# Patient Record
Sex: Male | Born: 1960 | Race: White | Hispanic: No | Smoking: Former smoker
Health system: Southern US, Community
[De-identification: ages and names within clinical notes are randomized; demographics above are authoritative.]

## PROBLEM LIST (undated history)

## (undated) DIAGNOSIS — C7951 Secondary malignant neoplasm of bone: Secondary | ICD-10-CM

## (undated) DIAGNOSIS — K219 Gastro-esophageal reflux disease without esophagitis: Secondary | ICD-10-CM

## (undated) DIAGNOSIS — C029 Malignant neoplasm of tongue, unspecified: Secondary | ICD-10-CM

## (undated) DIAGNOSIS — I10 Essential (primary) hypertension: Secondary | ICD-10-CM

## (undated) DIAGNOSIS — F329 Major depressive disorder, single episode, unspecified: Secondary | ICD-10-CM

## (undated) DIAGNOSIS — C7800 Secondary malignant neoplasm of unspecified lung: Secondary | ICD-10-CM

## (undated) DIAGNOSIS — R21 Rash and other nonspecific skin eruption: Secondary | ICD-10-CM

## (undated) DIAGNOSIS — K449 Diaphragmatic hernia without obstruction or gangrene: Secondary | ICD-10-CM

## (undated) DIAGNOSIS — Z923 Personal history of irradiation: Secondary | ICD-10-CM

## (undated) DIAGNOSIS — F32A Depression, unspecified: Secondary | ICD-10-CM

## (undated) DIAGNOSIS — C801 Malignant (primary) neoplasm, unspecified: Secondary | ICD-10-CM

## (undated) DIAGNOSIS — Z9221 Personal history of antineoplastic chemotherapy: Secondary | ICD-10-CM

## (undated) DIAGNOSIS — R911 Solitary pulmonary nodule: Secondary | ICD-10-CM

## (undated) HISTORY — DX: Solitary pulmonary nodule: R91.1

## (undated) HISTORY — DX: Malignant neoplasm of tongue, unspecified: C02.9

## (undated) HISTORY — PX: TONSILLECTOMY AND ADENOIDECTOMY: SUR1326

## (undated) HISTORY — DX: Depression, unspecified: F32.A

## (undated) HISTORY — PX: HERNIA REPAIR: SHX51

## (undated) HISTORY — DX: Rash and other nonspecific skin eruption: R21

## (undated) HISTORY — DX: Personal history of irradiation: Z92.3

## (undated) HISTORY — PX: OTHER SURGICAL HISTORY: SHX169

## (undated) HISTORY — DX: Secondary malignant neoplasm of bone: C79.51

## (undated) HISTORY — DX: Essential (primary) hypertension: I10

## (undated) HISTORY — DX: Major depressive disorder, single episode, unspecified: F32.9

---

## 2003-01-22 ENCOUNTER — Encounter: Payer: Self-pay | Admitting: Internal Medicine

## 2004-11-17 ENCOUNTER — Ambulatory Visit: Payer: Self-pay | Admitting: Internal Medicine

## 2005-11-23 ENCOUNTER — Ambulatory Visit: Payer: Self-pay | Admitting: Internal Medicine

## 2005-11-30 ENCOUNTER — Ambulatory Visit: Payer: Self-pay | Admitting: Internal Medicine

## 2007-01-24 ENCOUNTER — Ambulatory Visit: Payer: Self-pay | Admitting: Internal Medicine

## 2007-03-07 ENCOUNTER — Ambulatory Visit: Payer: Self-pay | Admitting: Internal Medicine

## 2007-03-12 DIAGNOSIS — I10 Essential (primary) hypertension: Secondary | ICD-10-CM | POA: Insufficient documentation

## 2008-02-14 ENCOUNTER — Ambulatory Visit: Payer: Self-pay | Admitting: Internal Medicine

## 2008-02-14 LAB — CONVERTED CEMR LAB
ALT: 19 units/L (ref 0–53)
BUN: 15 mg/dL (ref 6–23)
Basophils Relative: 0.7 % (ref 0.0–1.0)
Bilirubin, Direct: 0.1 mg/dL (ref 0.0–0.3)
CO2: 26 meq/L (ref 19–32)
Calcium: 9.7 mg/dL (ref 8.4–10.5)
Creatinine, Ser: 1.1 mg/dL (ref 0.4–1.5)
Direct LDL: 64.8 mg/dL
Glucose, Bld: 95 mg/dL (ref 70–99)
Glucose, Urine, Semiquant: NEGATIVE
Hemoglobin: 13.2 g/dL (ref 13.0–17.0)
Lymphocytes Relative: 28 % (ref 12.0–46.0)
Monocytes Relative: 8.8 % (ref 3.0–12.0)
Neutro Abs: 4.3 10*3/uL (ref 1.4–7.7)
Protein, U semiquant: NEGATIVE
RBC: 3.89 M/uL — ABNORMAL LOW (ref 4.22–5.81)
TSH: 2.38 microintl units/mL (ref 0.35–5.50)
Total CHOL/HDL Ratio: 6
Total Protein: 7.3 g/dL (ref 6.0–8.3)
Urobilinogen, UA: 0.2
VLDL: 88 mg/dL — ABNORMAL HIGH (ref 0–40)
WBC Urine, dipstick: NEGATIVE
pH: 5

## 2008-02-21 ENCOUNTER — Ambulatory Visit: Payer: Self-pay | Admitting: Internal Medicine

## 2008-02-21 DIAGNOSIS — F3289 Other specified depressive episodes: Secondary | ICD-10-CM | POA: Insufficient documentation

## 2008-02-21 DIAGNOSIS — K5730 Diverticulosis of large intestine without perforation or abscess without bleeding: Secondary | ICD-10-CM | POA: Insufficient documentation

## 2008-02-21 DIAGNOSIS — K649 Unspecified hemorrhoids: Secondary | ICD-10-CM | POA: Insufficient documentation

## 2008-02-21 DIAGNOSIS — K589 Irritable bowel syndrome without diarrhea: Secondary | ICD-10-CM

## 2008-02-21 DIAGNOSIS — F329 Major depressive disorder, single episode, unspecified: Secondary | ICD-10-CM

## 2008-02-21 DIAGNOSIS — K4090 Unilateral inguinal hernia, without obstruction or gangrene, not specified as recurrent: Secondary | ICD-10-CM | POA: Insufficient documentation

## 2008-02-28 ENCOUNTER — Telehealth: Payer: Self-pay | Admitting: Internal Medicine

## 2008-02-29 ENCOUNTER — Telehealth: Payer: Self-pay | Admitting: Internal Medicine

## 2008-03-13 ENCOUNTER — Emergency Department (HOSPITAL_COMMUNITY): Admission: EM | Admit: 2008-03-13 | Discharge: 2008-03-13 | Payer: Self-pay | Admitting: Emergency Medicine

## 2008-03-20 ENCOUNTER — Ambulatory Visit: Payer: Self-pay | Admitting: Internal Medicine

## 2008-03-20 DIAGNOSIS — M549 Dorsalgia, unspecified: Secondary | ICD-10-CM | POA: Insufficient documentation

## 2008-03-20 LAB — HM COLONOSCOPY

## 2008-03-21 ENCOUNTER — Ambulatory Visit: Payer: Self-pay | Admitting: Internal Medicine

## 2008-03-24 ENCOUNTER — Telehealth: Payer: Self-pay | Admitting: Internal Medicine

## 2008-03-25 ENCOUNTER — Encounter: Payer: Self-pay | Admitting: Internal Medicine

## 2008-04-01 ENCOUNTER — Ambulatory Visit (HOSPITAL_COMMUNITY): Admission: RE | Admit: 2008-04-01 | Discharge: 2008-04-01 | Payer: Self-pay | Admitting: Chiropractic Medicine

## 2008-04-17 ENCOUNTER — Encounter: Admission: RE | Admit: 2008-04-17 | Discharge: 2008-04-17 | Payer: Self-pay | Admitting: Surgery

## 2008-04-18 ENCOUNTER — Ambulatory Visit: Payer: Self-pay | Admitting: Internal Medicine

## 2008-04-22 ENCOUNTER — Encounter: Payer: Self-pay | Admitting: Internal Medicine

## 2008-06-04 ENCOUNTER — Telehealth: Payer: Self-pay | Admitting: Internal Medicine

## 2008-06-10 ENCOUNTER — Telehealth: Payer: Self-pay | Admitting: Internal Medicine

## 2010-12-03 ENCOUNTER — Ambulatory Visit (INDEPENDENT_AMBULATORY_CARE_PROVIDER_SITE_OTHER): Payer: Self-pay | Admitting: Internal Medicine

## 2010-12-03 ENCOUNTER — Encounter: Payer: Self-pay | Admitting: Internal Medicine

## 2010-12-03 DIAGNOSIS — I10 Essential (primary) hypertension: Secondary | ICD-10-CM

## 2010-12-03 DIAGNOSIS — K146 Glossodynia: Secondary | ICD-10-CM

## 2010-12-03 DIAGNOSIS — K148 Other diseases of tongue: Secondary | ICD-10-CM

## 2010-12-05 NOTE — Assessment & Plan Note (Signed)
Given persistence of lesion over 2-6 months recommend ENT evaluation to r/o malignancy. Consult appt will be made.

## 2010-12-05 NOTE — Progress Notes (Signed)
  Subjective:    Patient ID: Thomas Bean, male    DOB: 1961/05/15, 50 y.o.   MRN: [id number]  HPI Pt presents to clinic for evalution of tongue sore. Notes raised sore at left lateral tongue border for at least 2 months and may be up to 6 months. Irritation and pain waxes and wanes but the area has never healed and resolved. There has been no injury/trauma and denies drainage. No alleviating factors. Taking no medication for the problem. BP elevated with h/o htn but stopped ace inhib last year. Recently taking claritin D.      Review of Systems see hpi     Objective:   Physical Exam  Constitutional: He appears well-developed and well-nourished. No distress.  HENT:  Head: Normocephalic.  Mouth/Throat:         Left lateral tongue sl raised irritated white/red papule. No drainage. Mild surrounding erythema  Eyes: Conjunctivae are normal. No scleral icterus.          Assessment & Plan:

## 2010-12-05 NOTE — Assessment & Plan Note (Signed)
Suboptimal control. Asx. Stop decongestants and begin outpt bp log to be submitted for review

## 2010-12-12 DIAGNOSIS — C029 Malignant neoplasm of tongue, unspecified: Secondary | ICD-10-CM

## 2010-12-12 HISTORY — DX: Malignant neoplasm of tongue, unspecified: C02.9

## 2010-12-15 ENCOUNTER — Other Ambulatory Visit (HOSPITAL_COMMUNITY): Payer: Self-pay | Admitting: Otolaryngology

## 2010-12-15 ENCOUNTER — Telehealth: Payer: Self-pay | Admitting: *Deleted

## 2010-12-15 DIAGNOSIS — I10 Essential (primary) hypertension: Secondary | ICD-10-CM

## 2010-12-15 DIAGNOSIS — IMO0002 Reserved for concepts with insufficient information to code with codable children: Secondary | ICD-10-CM

## 2010-12-15 NOTE — Telephone Encounter (Signed)
Pt is to have surgery on his tongue.  Biopsy was carcinoma?  He needs Lisinopril, and states she did not get it the day he saw Dr. Rodena Medin.

## 2010-12-16 ENCOUNTER — Ambulatory Visit: Payer: Self-pay | Admitting: Family Medicine

## 2010-12-16 MED ORDER — LISINOPRIL-HYDROCHLOROTHIAZIDE 20-25 MG PO TABS: 1.0000 | ORAL_TABLET | Freq: Every day | ORAL | Status: DC

## 2010-12-16 NOTE — Telephone Encounter (Signed)
rx sent in electronically 

## 2010-12-17 ENCOUNTER — Encounter (HOSPITAL_COMMUNITY)
Admission: RE | Admit: 2010-12-17 | Discharge: 2010-12-17 | Disposition: A | Discharge status: Home / Self Care | Payer: Self-pay | Source: Ambulatory Visit | Attending: Otolaryngology | Admitting: Otolaryngology

## 2010-12-17 ENCOUNTER — Encounter (HOSPITAL_COMMUNITY): Payer: Self-pay

## 2010-12-17 DIAGNOSIS — D739 Disease of spleen, unspecified: Secondary | ICD-10-CM | POA: Insufficient documentation

## 2010-12-17 DIAGNOSIS — I700 Atherosclerosis of aorta: Secondary | ICD-10-CM | POA: Insufficient documentation

## 2010-12-17 DIAGNOSIS — K8020 Calculus of gallbladder without cholecystitis without obstruction: Secondary | ICD-10-CM | POA: Insufficient documentation

## 2010-12-17 DIAGNOSIS — J984 Other disorders of lung: Secondary | ICD-10-CM | POA: Insufficient documentation

## 2010-12-17 DIAGNOSIS — C01 Malignant neoplasm of base of tongue: Secondary | ICD-10-CM | POA: Insufficient documentation

## 2010-12-17 DIAGNOSIS — IMO0002 Reserved for concepts with insufficient information to code with codable children: Secondary | ICD-10-CM

## 2010-12-17 DIAGNOSIS — N281 Cyst of kidney, acquired: Secondary | ICD-10-CM | POA: Insufficient documentation

## 2010-12-17 HISTORY — DX: Malignant (primary) neoplasm, unspecified: C80.1

## 2010-12-17 MED ORDER — FLUDEOXYGLUCOSE F - 18 (FDG) INJECTION
19.2000 | Freq: Once | INTRAVENOUS | Status: AC | PRN
  Administered 2010-12-17: 19.2 via INTRAVENOUS

## 2010-12-21 ENCOUNTER — Ambulatory Visit (INDEPENDENT_AMBULATORY_CARE_PROVIDER_SITE_OTHER): Payer: Self-pay | Admitting: Internal Medicine

## 2010-12-21 ENCOUNTER — Encounter: Payer: Self-pay | Admitting: Internal Medicine

## 2010-12-21 ENCOUNTER — Telehealth: Payer: Self-pay

## 2010-12-21 ENCOUNTER — Encounter (HOSPITAL_BASED_OUTPATIENT_CLINIC_OR_DEPARTMENT_OTHER)
Admission: RE | Admit: 2010-12-21 | Discharge: 2010-12-21 | Disposition: A | Discharge status: Home / Self Care | Payer: Self-pay | Source: Ambulatory Visit | Attending: Otolaryngology | Admitting: Otolaryngology

## 2010-12-21 VITALS — BP 140/90 | HR 100 | Temp 98.6°F | Wt 196.0 lb

## 2010-12-21 DIAGNOSIS — F411 Generalized anxiety disorder: Secondary | ICD-10-CM

## 2010-12-21 DIAGNOSIS — Z79899 Other long term (current) drug therapy: Secondary | ICD-10-CM

## 2010-12-21 DIAGNOSIS — C029 Malignant neoplasm of tongue, unspecified: Secondary | ICD-10-CM | POA: Insufficient documentation

## 2010-12-21 DIAGNOSIS — F419 Anxiety disorder, unspecified: Secondary | ICD-10-CM | POA: Insufficient documentation

## 2010-12-21 DIAGNOSIS — I10 Essential (primary) hypertension: Secondary | ICD-10-CM

## 2010-12-21 LAB — BASIC METABOLIC PANEL
BUN: 12 mg/dL (ref 6–23)
GFR: 85.66 mL/min (ref >60.00)
Glucose, Bld: 92 mg/dL (ref 70–99)
Potassium: 4.2 mEq/L (ref 3.5–5.1)

## 2010-12-21 MED ORDER — ALPRAZOLAM 0.25 MG PO TABS: 0.2500 mg | ORAL_TABLET | Freq: Three times a day (TID) | ORAL | Status: DC | PRN

## 2010-12-21 MED ORDER — LISINOPRIL-HYDROCHLOROTHIAZIDE 20-25 MG PO TABS: 1.0000 | ORAL_TABLET | Freq: Every day | ORAL | Status: DC

## 2010-12-21 NOTE — Assessment & Plan Note (Signed)
Begin xanax prn short term. Discussed potential for sedation, addiction and/or tolerance.

## 2010-12-21 NOTE — Progress Notes (Signed)
  Subjective:    Patient ID: Thomas Bean, male    DOB: 05-30-1961, 50 y.o.   MRN: [id number]  HPI Pt presents to clinic for f/u of HTN and tongue lesion. Was referred last visit to ENT for persistent tongue lesion. Bx + for SSC. PET reportedly demonstrated 2nd lesion at base of tongue. Scheduled for surgery in 2 days. H/o HTN off medication. Home BP's have been mildly high. Previously took ace/hct and had mild tolerable cough.  Still taking decongestant/antihistamine for allergies.  Is anxious regarding recent dx and upcoming surgery.  Reviewed pmh, medications and allergies.    Review of Systems Denies tongue pain or difficulty swallowing.     Objective:   Physical Exam  Vitals reviewed. Constitutional: He appears well-developed and well-nourished. No distress.  HENT:  Head: Normocephalic and atraumatic.  Right Ear: External ear normal.  Left Ear: External ear normal.  Eyes: Conjunctivae are normal. No scleral icterus.  Cardiovascular: Normal rate, regular rhythm and normal heart sounds.   Pulmonary/Chest: Effort normal and breath sounds normal. No respiratory distress. He has no wheezes.  Skin: He is not diaphoretic.          Assessment & Plan:

## 2010-12-21 NOTE — Telephone Encounter (Signed)
Message copied by Kyung Rudd on Tue Dec 21, 2010  4:29 PM ------      Message from: Letitia Libra, Maisie Fus      Created: Tue Dec 21, 2010  3:49 PM       chem7 nl. pls give him a copy. He might have preop labs and this could save him money

## 2010-12-21 NOTE — Assessment & Plan Note (Signed)
Avoid decongestants. Resume ace/hctz. Monitor bp at home. Check chem7 today. F/u in one month or sooner if necessary.

## 2010-12-23 ENCOUNTER — Ambulatory Visit (HOSPITAL_BASED_OUTPATIENT_CLINIC_OR_DEPARTMENT_OTHER)
Admission: RE | Admit: 2010-12-23 | Discharge: 2010-12-23 | Disposition: A | Discharge status: Home / Self Care | Payer: Self-pay | Source: Ambulatory Visit | Attending: Otolaryngology | Admitting: Otolaryngology

## 2010-12-23 ENCOUNTER — Other Ambulatory Visit: Payer: Self-pay | Admitting: Otolaryngology

## 2010-12-23 DIAGNOSIS — Z01812 Encounter for preprocedural laboratory examination: Secondary | ICD-10-CM | POA: Insufficient documentation

## 2010-12-23 DIAGNOSIS — F17200 Nicotine dependence, unspecified, uncomplicated: Secondary | ICD-10-CM | POA: Insufficient documentation

## 2010-12-23 DIAGNOSIS — C023 Malignant neoplasm of anterior two-thirds of tongue, part unspecified: Secondary | ICD-10-CM | POA: Insufficient documentation

## 2011-01-05 NOTE — Op Note (Addendum)
[name], [name]          ACCOUNT NO.:  [id number]  MEDICAL RECORD NO.:  [id number]           PATIENT TYPE:  O  LOCATION:  XRAY                         FACILITY:  [facility]  PHYSICIAN:  Kristine Garbe. Ezzard Standing, M.D.DATE OF BIRTH:  [birth year]  DATE OF PROCEDURE:  12/23/2010 DATE OF DISCHARGE:  12/17/2010                              OPERATIVE REPORT   PREOPERATIVE DIAGNOSIS:  Left lateral tongue squamous cell carcinoma.  POSTOPERATIVE DIAGNOSIS:  Left lateral tongue squamous cell carcinoma.  OPERATION PERFORMED:  Direct laryngoscopy with biopsy.  Left subtotal hemiglossectomy.  SURGEON:  Neilan Rizzo. Ezzard Standing, M.D.  ANESTHESIA:  General endotracheal.  ESTIMATED BLOOD LOSS:  30 mL.  COMPLICATIONS:  None.  BRIEF CLINICAL NOTE:  Thomas Bean is a 50 year old gentleman who has had a sore on the left lateral tongue now for several months.  This gradually had been getting worse.  He has a smoking history of approximately a pack a day, but has reduced his smoking recently.  Only a social alcohol user.  A biopsy in the office of the left lateral tongue lesion revealed invasive squamous cell carcinoma.  He subsequently underwent a PET scan for further evaluation and on the PET scan, there was no obvious metastatic disease or adenopathy in the neck. However, there was a questionable hypermetabolic region in the right base of tongue.  He was taken to the operating room at this time for direct laryngoscopy and biopsy of the right base of tongue and partial left lateral glossectomy.  The lesion measures approximately 1.5 cm in size, it is indurated and infiltrative.  DESCRIPTION OF PROCEDURE:  After adequate endotracheal anesthesia, first direct laryngoscopy was performed.  On direct laryngoscopy, the base of the tongue was actually fairly clear.  There was no ulcer on palpation of the base of tongue.  There was no definitive palpable mass.  He did have some lingual tonsil tissue  that extended down toward the base of the tongue region.  He had previous tonsillectomy as a child.  Several biopsies were obtained from the right base of tongue region where this prominent lingual tonsil tissue appeared.  There was no definite ulceration, no bleeding, and the vallecular area was otherwise clear. Direct laryngoscopy of the piriform sinus, epiglottis, AE folds, vocal cords was otherwise clear.  Following the DL and biopsy, a Cordie Grice was used to expose the oropharynx and the tongue lesion was identified.  This resided on the left lateral tongue mid to anterior. Approximate area of the molars abutted against the tongue on the left side.  The excision site was planned with cautery and marked out about a centimeter margin away from the gross visualization of the tumor. Excision was performed with a cautery, making sure we had plenty of margin away from the gross tumor.  Hemostasis was obtained with a cautery.  After adequate excision, a rim of the dorsal portion of the tongue was excised and sent as a separate specimen as was a small deep margin.  Defect was closed with 3-0 Vicryl sutures within the tongue musculature, reapproximated the edges, and then interrupted 3-0 chromic and 3-0 Vicryl sutures to reapproximate the edges of  the tongue excision.  There was minimal bleeding.  [name] tolerated this well, was awoken from anesthesia, and transferred to the recovery room postop, doing well.  DISPOSITION:  Thomas Bean is discharged home later this morning on amoxicillin suspension 500 mg b.i.d. for 10 days, Tylenol Lortab Elixir 1-1.5 tablespoons q.4 h. p.r.n. pain.  We will have him follow up in my office in 10-12 days for recheck and review pathology.          ______________________________ Kristine Garbe Ezzard Standing, M.D.     CEN/MEDQ  D:  12/23/2010  T:  12/23/2010  Job:  [id number]  cc:   Charlynn Court, MD  Electronically Signed by Dillard Cannon M.D. on  01/05/2011 01:13:35 PM

## 2011-01-18 ENCOUNTER — Telehealth: Payer: Self-pay | Admitting: Internal Medicine

## 2011-01-18 NOTE — Telephone Encounter (Signed)
Pt is req to change pcps from Dr Cato Mulligan to Dr Rodena Medin, due to availibility issues. Pls advise if this is ok?

## 2011-01-18 NOTE — Telephone Encounter (Signed)
ok 

## 2011-01-19 NOTE — Telephone Encounter (Signed)
Pt has been notified that both drs have agreed to pcp change. Pt has been sch to see Dr Rodena Medin for fup as noted.

## 2011-01-20 ENCOUNTER — Ambulatory Visit: Payer: Self-pay | Admitting: Internal Medicine

## 2011-02-11 ENCOUNTER — Ambulatory Visit: Payer: Self-pay | Admitting: Internal Medicine

## 2011-06-07 ENCOUNTER — Other Ambulatory Visit: Payer: Self-pay | Admitting: Otolaryngology

## 2011-06-07 DIAGNOSIS — C029 Malignant neoplasm of tongue, unspecified: Secondary | ICD-10-CM

## 2011-07-11 ENCOUNTER — Ambulatory Visit
Admission: RE | Admit: 2011-07-11 | Discharge: 2011-07-11 | Disposition: A | Discharge status: Home / Self Care | Payer: No Typology Code available for payment source | Source: Ambulatory Visit | Attending: Otolaryngology | Admitting: Otolaryngology

## 2011-07-11 DIAGNOSIS — C029 Malignant neoplasm of tongue, unspecified: Secondary | ICD-10-CM

## 2011-07-11 MED ORDER — IOHEXOL 300 MG/ML  SOLN
75.0000 mL | Freq: Once | INTRAMUSCULAR | Status: AC | PRN
  Administered 2011-07-11: 75 mL via INTRAVENOUS

## 2011-07-14 ENCOUNTER — Other Ambulatory Visit (HOSPITAL_COMMUNITY)
Admission: RE | Admit: 2011-07-14 | Discharge: 2011-07-14 | Disposition: A | Discharge status: Home / Self Care | Payer: Medicaid Other | Source: Ambulatory Visit | Attending: Otolaryngology | Admitting: Otolaryngology

## 2011-07-14 DIAGNOSIS — R221 Localized swelling, mass and lump, neck: Secondary | ICD-10-CM | POA: Insufficient documentation

## 2011-07-14 DIAGNOSIS — R220 Localized swelling, mass and lump, head: Secondary | ICD-10-CM | POA: Insufficient documentation

## 2011-07-21 ENCOUNTER — Other Ambulatory Visit (HOSPITAL_COMMUNITY): Payer: Self-pay | Admitting: Otolaryngology

## 2011-07-21 DIAGNOSIS — C760 Malignant neoplasm of head, face and neck: Secondary | ICD-10-CM

## 2011-07-22 ENCOUNTER — Encounter (HOSPITAL_COMMUNITY): Payer: Self-pay | Admitting: Pharmacy Technician

## 2011-07-25 ENCOUNTER — Encounter (HOSPITAL_COMMUNITY)
Admission: RE | Admit: 2011-07-25 | Discharge: 2011-07-25 | Disposition: A | Discharge status: Home / Self Care | Payer: Medicaid Other | Source: Ambulatory Visit | Attending: Otolaryngology | Admitting: Otolaryngology

## 2011-07-25 DIAGNOSIS — C029 Malignant neoplasm of tongue, unspecified: Secondary | ICD-10-CM | POA: Insufficient documentation

## 2011-07-25 DIAGNOSIS — C760 Malignant neoplasm of head, face and neck: Secondary | ICD-10-CM

## 2011-07-25 DIAGNOSIS — R599 Enlarged lymph nodes, unspecified: Secondary | ICD-10-CM | POA: Insufficient documentation

## 2011-07-25 MED ORDER — FLUDEOXYGLUCOSE F - 18 (FDG) INJECTION
18.6000 | Freq: Once | INTRAVENOUS | Status: AC | PRN
  Administered 2011-07-25: 18.6 via INTRAVENOUS

## 2011-07-26 ENCOUNTER — Encounter (HOSPITAL_COMMUNITY)
Admission: RE | Admit: 2011-07-26 | Discharge: 2011-07-26 | Disposition: A | Discharge status: Home / Self Care | Payer: Medicaid Other | Source: Ambulatory Visit | Attending: Anesthesiology | Admitting: Anesthesiology

## 2011-07-26 ENCOUNTER — Encounter (HOSPITAL_COMMUNITY): Payer: Self-pay

## 2011-07-26 ENCOUNTER — Encounter (HOSPITAL_COMMUNITY)
Admission: RE | Admit: 2011-07-26 | Discharge: 2011-07-26 | Disposition: A | Discharge status: Home / Self Care | Payer: Medicaid Other | Source: Ambulatory Visit | Attending: Otolaryngology | Admitting: Otolaryngology

## 2011-07-26 HISTORY — DX: Gastro-esophageal reflux disease without esophagitis: K21.9

## 2011-07-26 HISTORY — DX: Diaphragmatic hernia without obstruction or gangrene: K44.9

## 2011-07-26 LAB — BASIC METABOLIC PANEL
CO2: 28 mEq/L (ref 19–32)
Chloride: 106 mEq/L (ref 96–112)
Creatinine, Ser: 0.84 mg/dL (ref 0.50–1.35)
Sodium: 142 mEq/L (ref 135–145)

## 2011-07-26 LAB — CBC
HCT: 37.3 % — ABNORMAL LOW (ref 39.0–52.0)
MCV: 94.7 fL (ref 78.0–100.0)
RBC: 3.94 MIL/uL — ABNORMAL LOW (ref 4.22–5.81)
WBC: 6.9 10*3/uL (ref 4.0–10.5)

## 2011-07-26 MED ORDER — CEFAZOLIN SODIUM-DEXTROSE 2-3 GM-% IV SOLR: 2.0000 g | INTRAVENOUS | Status: DC

## 2011-07-26 NOTE — Pre-Procedure Instructions (Signed)
20 Thomas Bean  07/26/2011   Your procedure is scheduled on:  Wednesday July 27, 2011  Report to [facility] at 1230 AM.  Call this number if you have problems the morning of surgery: [phone]   Remember:   Do not eat food:After Midnight.  Do not drink clear liquids: 4 Hours before arrival. (8:30am)  Take these medicines the morning of surgery with A SIP OF WATER: claritin   Do not wear jewelry, make-up or nail polish.  Do not wear lotions, powders, or perfumes. You may wear deodorant.  Do not shave 48 hours prior to surgery.  Do not bring valuables to the hospital.  Contacts, dentures or bridgework may not be worn into surgery.  Leave suitcase in the car. After surgery it may be brought to your room.  For patients admitted to the hospital, checkout time is 11:00 AM the day of discharge.   Patients discharged the day of surgery will not be allowed to drive home.  Name and phone number of your driver: Ollie Esty [phone]  Special Instructions: CHG Shower Use Special Wash: 1/2 bottle night before surgery and 1/2 bottle morning of surgery.   Please read over the following fact sheets that you were given: Pain management, MRSA instruction, surgical site infections, coughing and deep breathing.

## 2011-07-27 ENCOUNTER — Encounter (HOSPITAL_COMMUNITY): Payer: Self-pay | Admitting: Otolaryngology

## 2011-07-27 ENCOUNTER — Other Ambulatory Visit: Payer: Self-pay | Admitting: Otolaryngology

## 2011-07-27 ENCOUNTER — Encounter (HOSPITAL_COMMUNITY): Payer: Self-pay | Admitting: *Deleted

## 2011-07-27 ENCOUNTER — Inpatient Hospital Stay (HOSPITAL_COMMUNITY)
Admission: RE | Admit: 2011-07-27 | Discharge: 2011-07-29 | DRG: 822 | Disposition: A | Discharge status: Home / Self Care | Payer: Medicaid Other | Source: Ambulatory Visit | Attending: Otolaryngology | Admitting: Otolaryngology

## 2011-07-27 ENCOUNTER — Encounter (HOSPITAL_COMMUNITY): Payer: Self-pay | Admitting: Critical Care Medicine

## 2011-07-27 ENCOUNTER — Encounter (HOSPITAL_COMMUNITY)
Admission: RE | Disposition: A | Discharge status: Home / Self Care | Payer: Self-pay | Source: Ambulatory Visit | Attending: Otolaryngology

## 2011-07-27 ENCOUNTER — Inpatient Hospital Stay (HOSPITAL_COMMUNITY): Payer: Medicaid Other | Admitting: Critical Care Medicine

## 2011-07-27 DIAGNOSIS — K449 Diaphragmatic hernia without obstruction or gangrene: Secondary | ICD-10-CM | POA: Diagnosis present

## 2011-07-27 DIAGNOSIS — F329 Major depressive disorder, single episode, unspecified: Secondary | ICD-10-CM | POA: Diagnosis present

## 2011-07-27 DIAGNOSIS — C770 Secondary and unspecified malignant neoplasm of lymph nodes of head, face and neck: Principal | ICD-10-CM | POA: Diagnosis present

## 2011-07-27 DIAGNOSIS — Z85810 Personal history of malignant neoplasm of tongue: Secondary | ICD-10-CM

## 2011-07-27 DIAGNOSIS — K219 Gastro-esophageal reflux disease without esophagitis: Secondary | ICD-10-CM | POA: Diagnosis present

## 2011-07-27 DIAGNOSIS — K649 Unspecified hemorrhoids: Secondary | ICD-10-CM | POA: Diagnosis present

## 2011-07-27 DIAGNOSIS — Z01818 Encounter for other preprocedural examination: Secondary | ICD-10-CM

## 2011-07-27 DIAGNOSIS — Z01812 Encounter for preprocedural laboratory examination: Secondary | ICD-10-CM

## 2011-07-27 DIAGNOSIS — F17200 Nicotine dependence, unspecified, uncomplicated: Secondary | ICD-10-CM | POA: Diagnosis present

## 2011-07-27 DIAGNOSIS — F3289 Other specified depressive episodes: Secondary | ICD-10-CM | POA: Diagnosis present

## 2011-07-27 HISTORY — PX: NECK MASS EXCISION: SHX2079

## 2011-07-27 HISTORY — PX: RADICAL NECK DISSECTION: SHX2284

## 2011-07-27 SURGERY — DISSECTION, NECK, RADICAL
Anesthesia: General | Site: Neck | Laterality: Left | Wound class: Clean

## 2011-07-27 MED ORDER — MORPHINE SULFATE 2 MG/ML IJ SOLN: 0.0500 mg/kg | INTRAMUSCULAR | Status: DC | PRN

## 2011-07-27 MED ORDER — IBUPROFEN 200 MG PO TABS
200.0000 mg | ORAL_TABLET | Freq: Four times a day (QID) | ORAL | Status: DC | PRN
  Administered 2011-07-28: 200 mg via ORAL
  Filled 2011-07-27: qty 1

## 2011-07-27 MED ORDER — MEPERIDINE HCL 25 MG/ML IJ SOLN: 6.2500 mg | INTRAMUSCULAR | Status: DC | PRN

## 2011-07-27 MED ORDER — ONDANSETRON HCL 4 MG/2ML IJ SOLN
INTRAMUSCULAR | Status: DC | PRN
  Administered 2011-07-27 (×2): 4 mg via INTRAVENOUS

## 2011-07-27 MED ORDER — SODIUM CHLORIDE 0.9 % IR SOLN
Status: DC | PRN
  Administered 2011-07-27: 1000 mL

## 2011-07-27 MED ORDER — LACTATED RINGERS IV SOLN
INTRAVENOUS | Status: DC
  Administered 2011-07-27: 14:00:00 via INTRAVENOUS

## 2011-07-27 MED ORDER — LIDOCAINE-EPINEPHRINE 1 %-1:100000 IJ SOLN
INTRAMUSCULAR | Status: DC | PRN
  Administered 2011-07-27: 8 mL

## 2011-07-27 MED ORDER — HYDROMORPHONE HCL PF 1 MG/ML IJ SOLN
INTRAMUSCULAR | Status: AC
  Administered 2011-07-27: 0.5 mg via INTRAVENOUS
  Filled 2011-07-27: qty 1

## 2011-07-27 MED ORDER — MIDAZOLAM HCL 5 MG/5ML IJ SOLN
INTRAMUSCULAR | Status: DC | PRN
  Administered 2011-07-27: 2 mg via INTRAVENOUS

## 2011-07-27 MED ORDER — PROMETHAZINE HCL 25 MG/ML IJ SOLN
6.2500 mg | INTRAMUSCULAR | Status: AC | PRN
  Administered 2011-07-27 (×2): 6.25 mg via INTRAVENOUS

## 2011-07-27 MED ORDER — MIDAZOLAM HCL 2 MG/2ML IJ SOLN: 0.5000 mg | Freq: Once | INTRAMUSCULAR | Status: DC | PRN

## 2011-07-27 MED ORDER — PROMETHAZINE HCL 25 MG/ML IJ SOLN: 6.2500 mg | INTRAMUSCULAR | Status: DC | PRN

## 2011-07-27 MED ORDER — LACTATED RINGERS IV SOLN
INTRAVENOUS | Status: DC | PRN
  Administered 2011-07-27 (×2): via INTRAVENOUS

## 2011-07-27 MED ORDER — ROCURONIUM BROMIDE 100 MG/10ML IV SOLN
INTRAVENOUS | Status: DC | PRN
  Administered 2011-07-27: 50 mg via INTRAVENOUS

## 2011-07-27 MED ORDER — LACTATED RINGERS IV SOLN: INTRAVENOUS | Status: DC

## 2011-07-27 MED ORDER — FENTANYL CITRATE 0.05 MG/ML IJ SOLN
INTRAMUSCULAR | Status: DC | PRN
  Administered 2011-07-27: 100 ug via INTRAVENOUS
  Administered 2011-07-27: 50 ug via INTRAVENOUS
  Administered 2011-07-27: 250 ug via INTRAVENOUS
  Administered 2011-07-27: 100 ug via INTRAVENOUS

## 2011-07-27 MED ORDER — HYDROMORPHONE HCL PF 1 MG/ML IJ SOLN
0.2500 mg | INTRAMUSCULAR | Status: DC | PRN
  Administered 2011-07-27 – 2011-07-28 (×5): 0.5 mg via INTRAVENOUS
  Administered 2011-07-29: 0.05 mg via INTRAVENOUS
  Filled 2011-07-27 (×5): qty 1

## 2011-07-27 MED ORDER — LIDOCAINE HCL (CARDIAC) 20 MG/ML IV SOLN
INTRAVENOUS | Status: DC | PRN
  Administered 2011-07-27: 100 mg via INTRAVENOUS

## 2011-07-27 MED ORDER — HYDROMORPHONE HCL PF 1 MG/ML IJ SOLN: 0.2500 mg | INTRAMUSCULAR | Status: DC | PRN

## 2011-07-27 MED ORDER — DEXAMETHASONE SODIUM PHOSPHATE 4 MG/ML IJ SOLN
INTRAMUSCULAR | Status: DC | PRN
  Administered 2011-07-27: 8 mg via INTRAVENOUS

## 2011-07-27 MED ORDER — PROPOFOL 10 MG/ML IV EMUL
INTRAVENOUS | Status: DC | PRN
  Administered 2011-07-27: 50 mg via INTRAVENOUS
  Administered 2011-07-27: 150 mg via INTRAVENOUS

## 2011-07-27 MED ORDER — BACITRACIN ZINC 500 UNIT/GM EX OINT
TOPICAL_OINTMENT | CUTANEOUS | Status: DC | PRN
  Administered 2011-07-27: 1 via TOPICAL

## 2011-07-27 MED ORDER — PROMETHAZINE HCL 25 MG/ML IJ SOLN
6.2500 mg | Freq: Four times a day (QID) | INTRAMUSCULAR | Status: DC | PRN
  Administered 2011-07-28: 6.25 mg via INTRAVENOUS
  Filled 2011-07-27: qty 1

## 2011-07-27 MED ORDER — KCL IN DEXTROSE-NACL 20-5-0.45 MEQ/L-%-% IV SOLN
INTRAVENOUS | Status: DC
  Administered 2011-07-28 – 2011-07-29 (×4): via INTRAVENOUS
  Filled 2011-07-27 (×10): qty 1000

## 2011-07-27 SURGICAL SUPPLY — 57 items
ATTRACTOMAT 16X20 MAGNETIC DRP (DRAPES) IMPLANT
BLADE SURG 10 STRL SS (BLADE) ×3 IMPLANT
BLADE SURG ROTATE 9660 (MISCELLANEOUS) IMPLANT
BUR EGG ELITE 4.0 (BURR) ×3 IMPLANT
CANISTER SUCTION 2500CC (MISCELLANEOUS) ×3 IMPLANT
CLEANER TIP ELECTROSURG 2X2 (MISCELLANEOUS) IMPLANT
CLOTH BEACON ORANGE TIMEOUT ST (SAFETY) ×3 IMPLANT
CONT SPEC 4OZ CLIKSEAL STRL BL (MISCELLANEOUS) ×3 IMPLANT
CORDS BIPOLAR (ELECTRODE) ×3 IMPLANT
COVER SURGICAL LIGHT HANDLE (MISCELLANEOUS) ×3 IMPLANT
DECANTER SPIKE VIAL GLASS SM (MISCELLANEOUS) ×3 IMPLANT
DRAIN PENROSE 1/4X12 LTX STRL (WOUND CARE) ×3 IMPLANT
DRAIN SNY 7 FPER (WOUND CARE) ×3 IMPLANT
DRAIN SYN 10FR 150002210 (WOUND CARE) IMPLANT
DRAPE PROXIMA HALF (DRAPES) ×3 IMPLANT
ELECT COATED BLADE 2.86 ST (ELECTRODE) ×3 IMPLANT
ELECT REM PT RETURN 9FT ADLT (ELECTROSURGICAL) ×3
ELECTRODE REM PT RTRN 9FT ADLT (ELECTROSURGICAL) ×2 IMPLANT
EVACUATOR SILICONE 100CC (DRAIN) IMPLANT
GAUZE SPONGE 4X4 16PLY XRAY LF (GAUZE/BANDAGES/DRESSINGS) ×3 IMPLANT
GLOVE BIO SURGEON STRL SZ7 (GLOVE) ×6 IMPLANT
GLOVE BIOGEL PI IND STRL 7.0 (GLOVE) ×2 IMPLANT
GLOVE BIOGEL PI IND STRL 8 (GLOVE) ×2 IMPLANT
GLOVE BIOGEL PI INDICATOR 7.0 (GLOVE) ×1
GLOVE BIOGEL PI INDICATOR 8 (GLOVE) ×1
GLOVE ECLIPSE 7.5 STRL STRAW (GLOVE) ×3 IMPLANT
GLOVE SS BIOGEL STRL SZ 7.5 (GLOVE) ×2 IMPLANT
GLOVE SUPERSENSE BIOGEL SZ 7.5 (GLOVE) ×1
GLOVE SURG SS PI 7.5 STRL IVOR (GLOVE) ×3 IMPLANT
GOWN PREVENTION PLUS XLARGE (GOWN DISPOSABLE) ×15 IMPLANT
GOWN STRL NON-REIN LRG LVL3 (GOWN DISPOSABLE) IMPLANT
KIT BASIN OR (CUSTOM PROCEDURE TRAY) ×3 IMPLANT
KIT ROOM TURNOVER OR (KITS) ×3 IMPLANT
LOCATOR NERVE 3 VOLT (DISPOSABLE) IMPLANT
NS IRRIG 1000ML POUR BTL (IV SOLUTION) ×3 IMPLANT
PAD ARMBOARD 7.5X6 YLW CONV (MISCELLANEOUS) ×6 IMPLANT
PENCIL BUTTON HOLSTER BLD 10FT (ELECTRODE) ×3 IMPLANT
SPECIMEN JAR MEDIUM (MISCELLANEOUS) IMPLANT
SPECIMEN JAR SMALL (MISCELLANEOUS) ×9 IMPLANT
SPONGE INTESTINAL PEANUT (DISPOSABLE) IMPLANT
SPONGE LAP 18X18 X RAY DECT (DISPOSABLE) ×3 IMPLANT
STAPLER VISISTAT 35W (STAPLE) ×3 IMPLANT
SUT CHROMIC 3 0 PS 2 (SUTURE) ×3 IMPLANT
SUT CHROMIC GUT 2 0 PS 2 27 (SUTURE) ×3 IMPLANT
SUT ETHILON 5 0 P 3 18 (SUTURE) ×1
SUT NYLON ETHILON 5-0 P-3 1X18 (SUTURE) ×2 IMPLANT
SUT SILK 2 0 (SUTURE) ×1
SUT SILK 2 0 FS (SUTURE) ×3 IMPLANT
SUT SILK 2 0 SH CR/8 (SUTURE) ×3 IMPLANT
SUT SILK 2-0 18XBRD TIE 12 (SUTURE) ×2 IMPLANT
SUT SILK 3 0 (SUTURE) ×1
SUT SILK 3-0 18XBRD TIE 12 (SUTURE) ×2 IMPLANT
TOWEL OR 17X24 6PK STRL BLUE (TOWEL DISPOSABLE) ×3 IMPLANT
TOWEL OR 17X26 10 PK STRL BLUE (TOWEL DISPOSABLE) ×3 IMPLANT
TRAY ENT MC OR (CUSTOM PROCEDURE TRAY) ×3 IMPLANT
TRAY FOLEY CATH 14FRSI W/METER (CATHETERS) ×3 IMPLANT
WATER STERILE IRR 1000ML POUR (IV SOLUTION) IMPLANT

## 2011-07-27 NOTE — Op Note (Signed)
[name], Bean          ACCOUNT NO.:  [id number]  MEDICAL RECORD NO.:  [id number]  LOCATION:  [facility]                         FACILITY:  [facility]  PHYSICIAN:  Ina Poupard. Ezzard Standing, M.D.DATE OF BIRTH:  [birth year]  DATE OF PROCEDURE:  07/27/2011 DATE OF DISCHARGE:                              OPERATIVE REPORT   PREOPERATIVE DIAGNOSIS:  Metastatic left neck cancer.  POSTOPERATIVE DIAGNOSIS:  Metastatic left neck cancer.  OPERATION PERFORMED:  Radical left neck dissection.  SURGEON:  Keimari Valdez. Ezzard Standing, M.D.  ASSISTANCE SURGEON:  Hermelinda Medicus, M.D.  ANESTHESIA:  General endotracheal.  COMPLICATIONS:  None.  BRIEF CLINICAL NOTE:  Thomas Bean is a 50 year old gentleman who had a previous history of a T1 N0 squamous cell carcinoma of the left lateral tongue that was excised in April 2012.  At subsequent followup, he developed a mass in the left submandibular area, and a PET scan was performed and this demonstrated a hypermetabolic mass measuring approximately 2.5 x 3 cm in the submandibular area just anterior to the submandibular gland, adjacent to the mandible.  He also have one hypermetabolic lymph node, level 2, jugular node.  Remaining area was clear with no hypermetabolic other activity noted.  He is taken to operating room at this time for left neck dissection with removal of the left submandibular mass.  DESCRIPTION OF PROCEDURE:  After adequate endotracheal anesthesia, neck was prepped with Betadine solution, draped out with sterile towels. Area of incision was marked out and injected with 8 mL of Xylocaine with epinephrine.  Horizontal umbilical incision was then made and subplatysmal flaps were elevated superiorly and inferiorly.  Dissection was first carried out along the jugular chain of lymph nodes.  The accessory nerve was identified and preserved in the posterior aspect of the neck.  Dissection was carried around the sternocleidomastoid  muscle, and the slightly enlarged about 1-1.5 cm firm node was identified at the approximate level of the hyoid bone.  Dissection was carried out superior to this, just inferior to the digastric muscle in the peri- jugular and lymphatic tissue was dissected off of the jugular vein, carotid artery, and vagus nerve, which were all identified and dissected inferiorly to include a slightly enlarged jugular node down to the level of the omohyoid muscle.  At this area, the specimen was completed and was dissected off of the jugular vein, carotid artery, and vagus nerve and sent as left neck specimen.  There is some additional tissue removed from the posterior aspect of the neck and labeled as a posterior inferior neck specimen.  Dissection was then carried out in the submandibular area where the submandibular mass in the submandibular gland were identified and I dissected out.  The mass was just anterior and slightly adjacent to the submandibular gland.  Dissection was carried out first superiorly to submandibular gland and then the mass was identified just anterior to this.  At this point, dissection was carried down to the mandible and the periosteum was incised as this mass was adjacent to the mandible and adherent to the periosteum.  The hypoglossal nerve was identified and preserved throughout the dissection.  Using manipulation or manual palpation to fill this firm mass, a dissection was carried out in  the floor of mouth.  The mylohyoid muscle was transected around the mass up toward the floor of mouth.  The periosteum was stripped off of the medial aspect of the mandible up toward the dentition.  After freeing up this tissue, a finger was placed inside the mouth to palpate floor of mouth and attempts to preserve the mucosa of the floor of mouth, but all the submandibular and floor of mouth muscle was dissected and transected with cautery, preserving the floor of mouth mucosa in the  immediate subcutaneous tissue to the floor of mouth.  Floor of mouth was not entered after freeing the mass out. This was dissected off including the submandibular gland.  It sent as a submandibular specimen.  After resecting the entire periosteum in the submandibular mass and submandibular gland, there is some slight irregularity of the bone where this mass was attached using a cutting bur, the bone was drilled down.  The cortex was drilled down until it was smooth.  Copious amounts of irrigation were utilized.  After irrigating and obtaining all hemostasis, a drain was brought out through an inferior incision in the neck and hooked with Hemovac suction. Wounds were closed with 2-0 and 3-0 chromic sutures subcutaneously, staples and 5-0 nylon to reapproximate skin edges.  This completed the procedure.  The Foley catheter was removed and the patient was awakened from anesthesia and transferred to the recovery room, postop doing well. He received 2 g of Ancef preoperatively and received postoperative Ancef and will be admitted to the surgical floor.          ______________________________ Kristine Garbe Ezzard Standing, M.D.     CEN/MEDQ  D:  07/27/2011  T:  07/27/2011  Job:  [id number]  cc:   Hermelinda Medicus, M.D.

## 2011-07-27 NOTE — Anesthesia Preprocedure Evaluation (Addendum)
Anesthesia Evaluation  Patient identified by MRN, date of birth, ID band Patient awake    Reviewed: Allergy & Precautions, H&P , NPO status , Patient's Chart, lab work & pertinent test results, reviewed documented beta blocker date and time   History of Anesthesia Complications (+) PONV  Airway Mallampati: II TM Distance: >3 FB Neck ROM: Full    Dental  (+) Dental Advisory Given, Caps and Teeth Intact   Pulmonary          Cardiovascular hypertension,     Neuro/Psych  Neuromuscular disease    GI/Hepatic hiatal hernia, GERD-  ,  Endo/Other    Renal/GU      Musculoskeletal   Abdominal   Peds  Hematology   Anesthesia Other Findings   Reproductive/Obstetrics                         Anesthesia Physical Anesthesia Plan  ASA: III  Anesthesia Plan: General   Post-op Pain Management:    Induction: Intravenous  Airway Management Planned: Oral ETT  Additional Equipment:   Intra-op Plan:   Post-operative Plan:   Informed Consent: I have reviewed the patients History and Physical, chart, labs and discussed the procedure including the risks, benefits and alternatives for the proposed anesthesia with the patient or authorized representative who has indicated his/her understanding and acceptance.   Dental advisory given  Plan Discussed with:   Anesthesia Plan Comments:         Anesthesia Quick Evaluation

## 2011-07-27 NOTE — Preoperative (Signed)
Beta Blockers   Reason not to administer Beta Blockers:Not Applicable 

## 2011-07-27 NOTE — Anesthesia Postprocedure Evaluation (Signed)
  Anesthesia Post-op Note  Patient: Thomas Bean  Procedure(s) Performed:  RADICAL NECK DISSECTION  Patient Location: PACU  Anesthesia Type: General  Level of Consciousness: awake  Airway and Oxygen Therapy: Patient Spontanous Breathing  Post-op Pain: mild  Post-op Assessment: Post-op Vital signs reviewed  Post-op Vital Signs: stable  Complications: No apparent anesthesia complications

## 2011-07-27 NOTE — Brief Op Note (Signed)
07/27/2011  7:14 PM  PATIENT:  Thomas Bean  50 y.o. male  PRE-OPERATIVE DIAGNOSIS:  LEFT NECK CANCER  POST-OPERATIVE DIAGNOSIS:  LEFT NECK CANCER  PROCEDURE:  Procedure(s): RADICAL NECK DISSECTION  SURGEON:  Surgeon(s): Drema Halon, MD Keturah Barre, MD  PHYSICIAN ASSISTANT:   ASSISTANTS: Hermelinda Medicus   ANESTHESIA:   general  EBL: 50   BLOOD ADMINISTERED:none  DRAINS: (1) Hemovact drain(s) in the neck with  Suction Open   LOCAL MEDICATIONS USED:  XYLOCAINE 8cc  SPECIMEN:  Source of Specimen:  left neck  DISPOSITION OF SPECIMEN:  PATHOLOGY  COUNTS:  YES  TOURNIQUET:  * No tourniquets in log *  DICTATION: .Other Dictation: Dictation Number [id number]  PLAN OF CARE: Admit to inpatient   PATIENT DISPOSITION:  PACU - hemodynamically stable.   Delay start of Pharmacological VTE agent (>24hrs) due to surgical blood loss or risk of bleeding:  {YES/NO/NOT APPLICABLE:20182

## 2011-07-27 NOTE — Transfer of Care (Signed)
Immediate Anesthesia Transfer of Care Note  Patient: Thomas Bean  Procedure(s) Performed:  RADICAL NECK DISSECTION  Patient Location: PACU  Anesthesia Type: General  Level of Consciousness: awake, alert , oriented and patient cooperative  Airway & Oxygen Therapy: Patient Spontanous Breathing and Patient connected to nasal cannula oxygen  Post-op Assessment: Report given to PACU RN, Post -op Vital signs reviewed and stable and Patient moving all extremities  Post vital signs: Reviewed and stable  Complications: No apparent anesthesia complications

## 2011-07-27 NOTE — H&P (Signed)
Thomas Bean is an 50 y.o. male.   Chief Complaint: Left neck mass HPI: Pat with hx of L tongue ca sp excision April 2012. Now with new neck mass. PET scan consistent with recurrent  L neck cancer. Admitted now for L neck dissection.  Past Medical History  Diagnosis Date  . Hemorrhoids   . PONV (postoperative nausea and vomiting)   . Hypertension     no medications at present  . GERD (gastroesophageal reflux disease)     no medication  . Depression     treated approx 12 years ago, no treatment at this time  . Hiatal hernia   . Cancer     left side of tongue T1 lesion excised 4/12    Past Surgical History  Procedure Date  . Inguinal herniorrhapy   . Tonsillectomy and adenoidectomy   . Excision of tongue mass     April 2012    Family History  Problem Relation Age of Onset  . Adopted: Yes  . Other      biological parents may have had cerebral aneurysm    Social History:  reports that he has been smoking Cigarettes.  He has a 5 pack-year smoking history. He does not have any smokeless tobacco history on file. He reports that he drinks alcohol. He reports that he does not use illicit drugs.  Allergies:  Allergies  Allergen Reactions  . Codeine Phosphate     REACTION: rash, swelling    Medications Prior to Admission  Medication Dose Route Frequency Provider Last Rate Last Dose  . ceFAZolin (ANCEF) IVPB 2 g/50 mL premix  2 g Intravenous 60 min Pre-Op Fredrik Rigger, PHARMD       Medications Prior to Admission  Medication Sig Dispense Refill  . ibuprofen (ADVIL,MOTRIN) 200 MG tablet Take 200 mg by mouth every 6 (six) hours as needed.        . loratadine (CLARITIN) 10 MG tablet Take 10 mg by mouth daily as needed. FOR ALLERGIES         Results for orders placed during the hospital encounter of 07/26/11 (from the past 48 hour(s))  BASIC METABOLIC PANEL     Status: Normal   Collection Time   07/26/11  9:51 AM      Component Value Range Comment   Sodium 142   135 - 145 (mEq/L)    Potassium 4.1  3.5 - 5.1 (mEq/L)    Chloride 106  96 - 112 (mEq/L)    CO2 28  19 - 32 (mEq/L)    Glucose, Bld 97  70 - 99 (mg/dL)    BUN 10  6 - 23 (mg/dL)    Creatinine, Ser 1.61  0.50 - 1.35 (mg/dL)    Calcium 9.8  8.4 - 10.5 (mg/dL)    GFR calc non Af Amer >90  >90 (mL/min)    GFR calc Af Amer >90  >90 (mL/min)   CBC     Status: Abnormal   Collection Time   07/26/11  9:51 AM      Component Value Range Comment   WBC 6.9  4.0 - 10.5 (K/uL)    RBC 3.94 (*) 4.22 - 5.81 (MIL/uL)    Hemoglobin 12.5 (*) 13.0 - 17.0 (g/dL)    HCT 09.6 (*) 04.5 - 52.0 (%)    MCV 94.7  78.0 - 100.0 (fL)    MCH 31.7  26.0 - 34.0 (pg)    MCHC 33.5  30.0 - 36.0 (g/dL)  RDW 12.1  11.5 - 15.5 (%)    Platelets 212  150 - 400 (K/uL)   SURGICAL PCR SCREEN     Status: Normal   Collection Time   07/26/11  9:52 AM      Component Value Range Comment   MRSA, PCR NEGATIVE  NEGATIVE     Staphylococcus aureus NEGATIVE  NEGATIVE     Dg Chest 2 View  07/26/2011  *RADIOLOGY REPORT*  Clinical Data: Preop.  Head and neck cancer.  CHEST - 2 VIEW  Comparison: 07/25/2011  Findings: The heart size appears normal.  No pleural effusion or pulmonary edema.  Coarsened interstitial markings are noted bilaterally.  No airspace consolidation.  IMPRESSION:  1.  No active cardiopulmonary abnormalities.  Original Report Authenticated By: Rosealee Albee, M.D.    Review of Systems  All other systems reviewed and are negative.    Blood pressure 139/93, pulse 89, temperature 99.1 F (37.3 C), temperature source Oral, resp. rate 18, weight 90.45 kg (199 lb 6.5 oz), SpO2 97.00%. Physical Exam  Constitutional: He is oriented to person, place, and time. He appears well-developed.  HENT:  Mouth/Throat: Oropharynx is clear and moist.  Neck:       2-3 cm L submandibular mass  Cardiovascular: Regular rhythm.   Respiratory: Effort normal.  GI: Normal appearance.  Lymphadenopathy:       Head (left side):  Submandibular adenopathy present.  Neurological: He is alert and oriented to person, place, and time.     Assessment/Plan Left neck mass consistent with metastatic CA . Admitted for L RND.   Tamar Miano E 07/27/2011, 2:00 PM

## 2011-07-27 NOTE — Anesthesia Procedure Notes (Signed)
Performed by: Nimrat Woolworth       

## 2011-07-28 ENCOUNTER — Encounter (HOSPITAL_COMMUNITY): Payer: Self-pay | Admitting: General Practice

## 2011-07-28 MED ORDER — CEFAZOLIN SODIUM 1-5 GM-% IV SOLN
1.0000 g | Freq: Three times a day (TID) | INTRAVENOUS | Status: DC
  Administered 2011-07-27: 2 g via INTRAVENOUS
  Administered 2011-07-28 – 2011-07-29 (×5): 1 g via INTRAVENOUS
  Filled 2011-07-28 (×8): qty 50

## 2011-07-28 MED ORDER — MORPHINE SULFATE 4 MG/ML IJ SOLN
2.0000 mg | INTRAMUSCULAR | Status: DC | PRN
  Administered 2011-07-28: 4 mg via INTRAVENOUS
  Filled 2011-07-28: qty 1

## 2011-07-28 MED ORDER — BACITRACIN ZINC 500 UNIT/GM EX OINT
1.0000 "application " | TOPICAL_OINTMENT | Freq: Three times a day (TID) | CUTANEOUS | Status: DC
  Administered 2011-07-28 – 2011-07-29 (×4): 1 via TOPICAL
  Filled 2011-07-28: qty 15

## 2011-07-28 MED ORDER — ACETAMINOPHEN 650 MG RE SUPP: 650.0000 mg | RECTAL | Status: DC | PRN

## 2011-07-28 MED ORDER — ONDANSETRON HCL 4 MG PO TABS: 4.0000 mg | ORAL_TABLET | ORAL | Status: DC | PRN

## 2011-07-28 MED ORDER — ACETAMINOPHEN 160 MG/5ML PO SOLN
650.0000 mg | ORAL | Status: DC | PRN
  Filled 2011-07-28: qty 20.3

## 2011-07-28 MED ORDER — HYDROCODONE-ACETAMINOPHEN 5-325 MG PO TABS
1.0000 | ORAL_TABLET | ORAL | Status: DC | PRN
  Administered 2011-07-28 – 2011-07-29 (×4): 2 via ORAL
  Filled 2011-07-28 (×4): qty 2

## 2011-07-28 MED ORDER — ONDANSETRON HCL 4 MG/2ML IJ SOLN: 4.0000 mg | INTRAMUSCULAR | Status: DC | PRN

## 2011-07-28 MED ORDER — SENNOSIDES-DOCUSATE SODIUM 8.6-50 MG PO TABS: 1.0000 | ORAL_TABLET | Freq: Every evening | ORAL | Status: DC | PRN

## 2011-07-28 NOTE — Progress Notes (Signed)
Subjective: Doing well w/o complaints. AF. Neck w/o swelling. Hemovac 30cc. OK pos.  Objective: Vital signs in last 24 hours: Temp:  [98 F (36.7 C)-99.2 F (37.3 C)] 98.3 F (36.8 C) (11/15 1036) Pulse Rate:  [71-97] 77  (11/15 1036) Resp:  [3-25] 18  (11/15 1036) BP: (139-162)/(75-93) 146/80 mmHg (11/15 1036) SpO2:  [94 %-100 %] 95 % (11/15 1036) Wt Readings from Last 1 Encounters:  07/26/11 90.45 kg (199 lb 6.5 oz)    Intake/Output from previous day: 11/14 0701 - 11/15 0700 In: 2500 [I.V.:2500] Out: 1000 [Urine:900; Blood:100] Intake/Output this shift:    Neck: no adenopathy, no carotid bruit, no JVD, supple, symmetrical, trachea midline, thyroid not enlarged, symmetric, no tenderness/mass/nodules and no swelling. Drain intack.   Basename 07/26/11 0951  WBC 6.9  HGB 12.5*  HCT 37.3*  PLT 212     Basename 07/26/11 0951  NA 142  K 4.1  CL 106  CO2 28  GLUCOSE 97  BUN 10  CREATININE 0.84  CALCIUM 9.8    Medications:   Assessment/Plan: Possible DC tomorrow.   LOS: 1 day   Weda Baumgarner E 07/28/2011, 12:19 PM

## 2011-07-28 NOTE — Progress Notes (Signed)
07-28-11 UR completed. Maury Bamba RN BSN  

## 2011-07-29 MED ORDER — ACETAMINOPHEN 500 MG PO TABS: 500.0000 mg | ORAL_TABLET | Freq: Four times a day (QID) | ORAL | Status: AC | PRN

## 2011-07-29 MED ORDER — CEPHALEXIN 500 MG PO CAPS: 500.0000 mg | ORAL_CAPSULE | Freq: Two times a day (BID) | ORAL | Status: AC

## 2011-07-29 MED ORDER — HYDROCODONE-ACETAMINOPHEN 5-500 MG PO TABS: 1.0000 | ORAL_TABLET | ORAL | Status: AC | PRN

## 2011-07-29 NOTE — Progress Notes (Signed)
D/C instructions reviewed with patient and father. RX x 2 given to pt by MD already. No hh services or equipment needed. Pt d/c'ed via ambulation in stable condition

## 2011-07-29 NOTE — Discharge Summary (Signed)
Physician Discharge Summary  Patient ID: Thomas Bean MRN: [id number] DOB/AGE: 11-11-1960 50 y.o.  Admit date: 07/27/2011 Discharge date: 07/29/2011  Admission Diagnoses:  Principal Problem:  *Cancer of head, face, or neck lymph nodes, secondary   Discharge Diagnoses:  Same  Surgeries: Procedure(s): RADICAL NECK DISSECTION on 07/27/2011   Consultants: none  Discharged Condition: Stable  Hospital Course: The patient was taken to the operating room on 07/27/2011 and underwent the above named procedure. He was subsequently admitted to the hospital. He received postoperative antibiotics Ancef. He is advanced to a liquid diet. He remained afebrile throughout his hospital course. His Hemovac drain was removed on the second postoperative day after minimal output. Wound was doing well and he was discharged on the second postoperative day 07/29/2011.      Recent vital signs:  Filed Vitals:   07/29/11 1400  BP: 138/80  Pulse: 74  Temp: 99.1 F (37.3 C)  Resp: 18    Recent laboratory studies:  Results for orders placed during the hospital encounter of 07/26/11  BASIC METABOLIC PANEL      Component Value Range   Sodium 142  135 - 145 (mEq/L)   Potassium 4.1  3.5 - 5.1 (mEq/L)   Chloride 106  96 - 112 (mEq/L)   CO2 28  19 - 32 (mEq/L)   Glucose, Bld 97  70 - 99 (mg/dL)   BUN 10  6 - 23 (mg/dL)   Creatinine, Ser 1.30  0.50 - 1.35 (mg/dL)   Calcium 9.8  8.4 - 86.5 (mg/dL)   GFR calc non Af Amer >90  >90 (mL/min)   GFR calc Af Amer >90  >90 (mL/min)  CBC      Component Value Range   WBC 6.9  4.0 - 10.5 (K/uL)   RBC 3.94 (*) 4.22 - 5.81 (MIL/uL)   Hemoglobin 12.5 (*) 13.0 - 17.0 (g/dL)   HCT 78.4 (*) 69.6 - 52.0 (%)   MCV 94.7  78.0 - 100.0 (fL)   MCH 31.7  26.0 - 34.0 (pg)   MCHC 33.5  30.0 - 36.0 (g/dL)   RDW 29.5  28.4 - 13.2 (%)   Platelets 212  150 - 400 (K/uL)  SURGICAL PCR SCREEN      Component Value Range   MRSA, PCR NEGATIVE  NEGATIVE    Staphylococcus  aureus NEGATIVE  NEGATIVE     Discharge Medications:   Current Discharge Medication List    START taking these medications   Details  acetaminophen (TYLENOL) 500 MG tablet Take 1 tablet (500 mg total) by mouth every 6 (six) hours as needed for pain. Qty: 30 tablet, Refills: 0    cephALEXin (KEFLEX) 500 MG capsule Take 1 capsule (500 mg total) by mouth 2 (two) times daily. Qty: 10 capsule, Refills: 0    HYDROcodone-acetaminophen (VICODIN) 5-500 MG per tablet Take 1-2 tablets by mouth every 4 (four) hours as needed for pain. Qty: 30 tablet, Refills: 0      CONTINUE these medications which have NOT CHANGED   Details  ibuprofen (ADVIL,MOTRIN) 200 MG tablet Take 200 mg by mouth every 6 (six) hours as needed.      loratadine (CLARITIN) 10 MG tablet Take 10 mg by mouth daily as needed. FOR ALLERGIES         Diagnostic Studies: Dg Chest 2 View  07/26/2011  *RADIOLOGY REPORT*  Clinical Data: Preop.  Head and neck cancer.  CHEST - 2 VIEW  Comparison: 07/25/2011  Findings: The heart size appears  normal.  No pleural effusion or pulmonary edema.  Coarsened interstitial markings are noted bilaterally.  No airspace consolidation.  IMPRESSION:  1.  No active cardiopulmonary abnormalities.  Original Report Authenticated By: Rosealee Albee, M.D.   Ct Soft Tissue Neck W Contrast  07/11/2011  *RADIOLOGY REPORT*  Clinical Data: History of carcinoma of the tongue.  Left neck and submandibular adenopathy.  Swelling and tenderness.  CT NECK WITH CONTRAST  Technique:  Multidetector CT imaging of the neck was performed with intravenous contrast.  Contrast: 75mL OMNIPAQUE IOHEXOL 300 MG/ML IV SOLN  Comparison: PET scan 12/17/2010  Findings: Lung apices are clear.  No superior mediastinal pathology.  Limited visualization of the intracranial contents does not show any abnormality.  Both parotid glands are normal.  I think the submandibular glands themselves are normal.  The thyroid gland is normal.  There is  asymmetry of the muscular sling of the floor the mouth.  I think there is probably enlargement of the mylohyoid muscle on the left rather than atrophy on the right.  This enlarging could be due to inflammation or tumor.  MRI or PET scan could evaluate this more clearly.  Additionally, there is an 8 mm level I lymph node adjacent to this that raises concern. However, this node was present at the time of the previous CT scan and was not hyper metabolic.  There did not appear to be the asymmetry noted on that previous scan.  Arterial and venous structures are patent.  There is a newly seen level III lymph node on the left that measures 9 mm in diameter. This is a concerning finding.  No enlarged or enlarging nodes are seen on the right.  IMPRESSION: Asymmetry of the muscular sling at the floor of the mouth on the left with enlargement of the mylohyoid muscle.  This could be due to inflammation or tumor infiltration.  8 mm level I lymph node just below that that is indeterminate.  This was present on the previous PET scan and was not hyper metabolic at that time.  9 mm level III lymph node on the left it is a new and worrisome finding.  Original Report Authenticated By: Thomasenia Sales, M.D.   Nm Pet Image Restag (ps) Skull Base To Thigh  07/25/2011  *RADIOLOGY REPORT*  Clinical Data: Subsequent treatment strategy for tongue carcinoma.  NUCLEAR MEDICINE PET CT RESTAGING (PS) SKULL BASE TO THIGH  Technique:  18.6 mCi F-18 FDG was injected intravenously via the right arm.  Full-ring PET imaging was performed from the skull base through the mid-thighs 72  minutes after injection.  CT data was obtained and used for attenuation correction and anatomic localization only.  (This was not acquired as a diagnostic CT examination.)  Fasting Blood Glucose:  120  Patient Weight:  190 pounds.  Comparison: 12/17/2010  Findings: Previously noted hypermetabolic activity in the tongue base just to the right of midline has nearly  completely resolved. New hypermetabolic lymphadenopathy is now seen in the left submandibular region (level I B), which measures 2.1 x 2.5 cm and has a maximum SUV of 9.3.  A small, less than 1 cm hypermetabolic lymph node is seen in the left upper internal jugular chain (level II A).  No other hypermetabolic lymph nodes or masses are identified within the neck.  No hilar lymphadenopathy of soft tissue masses are identified within the chest, abdomen, or pelvis. No new or enlarging pulmonary nodules are identified.  Old calcified cystic lesion in the  posterior aspect of the spleen remains stable and shows no hypermetabolic activity.  IMPRESSION:  1.  New hypermetabolic lymphadenopathy in the left submandibular region and left upper internal jugular chain, consistent with metastatic disease. 2.  No evidence of metastatic disease within the chest, abdomen, or pelvis.  Original Report Authenticated By: Danae Orleans, M.D.    Disposition: Home or Self Care  Discharge Orders    Future Orders Please Complete By Expires   Activity as tolerated - No restrictions      Call MD for:  temperature >100.4         Follow-up Information    Follow up with Kalijah Zeiss E, MD. Make an appointment in 4 days.   Contact information:   Individual [address] [place] [address] [phone]           Signed: GRIFFEN, FRAYNE 07/29/2011, 5:43 PM

## 2011-08-13 DIAGNOSIS — C801 Malignant (primary) neoplasm, unspecified: Secondary | ICD-10-CM

## 2011-08-13 HISTORY — DX: Malignant (primary) neoplasm, unspecified: C80.1

## 2011-08-18 ENCOUNTER — Encounter: Payer: Self-pay | Admitting: Radiation Oncology

## 2011-08-18 ENCOUNTER — Ambulatory Visit
Admission: RE | Admit: 2011-08-18 | Discharge: 2011-08-18 | Disposition: A | Discharge status: Home / Self Care | Payer: Medicaid Other | Source: Ambulatory Visit | Attending: Radiation Oncology | Admitting: Radiation Oncology

## 2011-08-18 VITALS — BP 164/97 | HR 78 | Temp 98.0°F | Resp 18 | Ht 72.0 in | Wt 195.6 lb

## 2011-08-18 DIAGNOSIS — C7952 Secondary malignant neoplasm of bone marrow: Secondary | ICD-10-CM | POA: Insufficient documentation

## 2011-08-18 DIAGNOSIS — C770 Secondary and unspecified malignant neoplasm of lymph nodes of head, face and neck: Secondary | ICD-10-CM

## 2011-08-18 DIAGNOSIS — Z87891 Personal history of nicotine dependence: Secondary | ICD-10-CM | POA: Insufficient documentation

## 2011-08-18 DIAGNOSIS — C50919 Malignant neoplasm of unspecified site of unspecified female breast: Secondary | ICD-10-CM | POA: Insufficient documentation

## 2011-08-18 DIAGNOSIS — I10 Essential (primary) hypertension: Secondary | ICD-10-CM | POA: Insufficient documentation

## 2011-08-18 DIAGNOSIS — C7951 Secondary malignant neoplasm of bone: Secondary | ICD-10-CM | POA: Insufficient documentation

## 2011-08-18 DIAGNOSIS — C029 Malignant neoplasm of tongue, unspecified: Secondary | ICD-10-CM | POA: Insufficient documentation

## 2011-08-18 NOTE — Progress Notes (Signed)
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 male who is seen today for the courtesy of Dr. 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 He was referred to Dr. Ezzard Standing by Dr. Marguarite Arbour this past spring for evaluation of a lesion along his left oral tongue. This was excised on 12/23/2010. His son had a 1.2 cm squamous cell carcinoma which extended into skeletal muscle, but margins were negative. The tumor was moderately differentiated. I do not see where the margins were quantitated. The tumor was P. 16 negative. During a subsequent followup visit he was noted to have a mass along his left submandibular region which measured 2.5 x 3.5 cm. A CT scan of the neck on 07/10/2011 showed asymmetry of the muscular sling at the floor the mouth on the left with enlargement of the myelo hyoid muscle. There was also question of a 0.9 cm level III lymph node on the left, new and felt to be a worrisome finding. A PET scan on 07/25/2011 showed new hypermetabolic lymphadenopathy in the left submandibular region and left upper interval jugular chain consistent with metastatic disease. This was a level II A neck node. He was taken to the operating room by Dr. Ezzard Standing on 07/27/2011 where he underwent excision of the left submandibular mass and left neck dissection. Dr. Ezzard Standing described a mass slightly adjacent to the submandibular gland, and just anterior. The dissection was carried down to the  mandible and the periosteum was incised as this was adjacent to the mandible and adherent to the periodic and. There was a thorough dissection of the left floor of mouth up to the subcutaneous tissue and mucosa. After resecting the periosteum there is felt to be slight irregularity of the bone with the mass was attached. The cortex was drilled down until it was smooth. On review of his pathology he was found to have a 3.5 cm moderately differentiated squamous cell carcinoma involving the submandibular gland. There was adjacent skeletal muscle tissue and bony tissue involvement, probably extending to the inked margin. There was no LV I. A total of 15 lymph nodes were negative for metastatic disease. The patient states that he had some bloody discharge from his neck wound this morning. He reports numbness along his upper neck and ear. He tells me he has not seen a dentist for 3 years. Marland Kitchen   PREVIOUS RADIATION THERAPY: No   PAST MEDICAL HISTORY:  has a past medical history of Hemorrhoids; PONV (postoperative nausea and vomiting); Hypertension; GERD (gastroesophageal reflux disease); Depression; Hiatal hernia; and Cancer.     PAST SURGICAL HISTORY: Past Surgical History  Procedure Date  . Inguinal herniorrhapy   . Tonsillectomy and adenoidectomy   . Excision of tongue mass     April 2012  . Hernia repair   . Neck mass excision 07/27/2011  . Radical neck dissection 07/27/2011    Procedure: RADICAL NECK DISSECTION;  Surgeon: Drema Halon, MD;  Location: [facility] OR;  Service: ENT;  Laterality: Left;  FAMILY HISTORY: He was adopted. His biological mother is alive with a history of having a cerebral aneurysm. She is 67. His father is believed to be alive in his 54s with coronary artery disease  SOCIAL HISTORY: Divorced, 2 year old son. 20-pack-year cigarette smoking history, stopped one month ago. Drinks socially. He worked in Regulatory affairs officer, currently unemployed.   ALLERGIES: Codeine  phosphate   MEDICATIONS:  Current Outpatient Prescriptions  Medication Sig Dispense Refill  . HYDROcodone-acetaminophen (VICODIN) 5-500 MG per tablet Take 1 tablet by mouth every 6 (six) hours as needed.        Marland Kitchen ibuprofen (ADVIL,MOTRIN) 200 MG tablet Take 200 mg by mouth every 6 (six) hours as needed.        . loratadine (CLARITIN) 10 MG tablet Take 10 mg by mouth daily as needed. FOR ALLERGIES           REVIEW OF SYSTEMS:  Pertinent items are noted in HPI.    PHYSICAL EXAM:  height is 6' (1.829 m) and weight is 195 lb 9.6 oz (88.724 kg). His oral temperature is 98 F (36.7 C). His blood pressure is 164/97 and his pulse is 78. His respiration is 18.  Head and neck examination: On inspection of left neck the wounds appear to be healing well. There is slight fullness along the junction of his cervical wounds. There is no palpable adenopathy. Oral cavity dentition appear to be in fair repair. Surgical changes noted along the left tongue. The tongue does deviate to the left with protrusion. There is no palpable or visible evidence for recurrent disease along his left tongue. Oral cavity unremarkable to inspection. Indirect mirror examination likewise unremarkable.    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 I suspect that he had a small left nodal metastasis that became replaced by metastatic disease. He appears to have had mandibular bone invasion, although this is not obvious on his preoperative CT scan was looking at his  study with bone windows. He is obviously at significant risk for recurrent disease. His prognosis is fair at best. I do recommend postoperative radiation therapy with consideration of concomitant chemotherapy as well. I will check with Dr. Ezzard Standing, and if there is a concern that he has residual disease involving bone that he should be considered for  surgical resection of his remaining disease within the mandible and then postoperative chemoradiation. This may require referral to a university setting. I'll send him to Dr. Gaylyn Rong for medical oncology consultation and to Dr. Kristin Bruins of dental oncology. From a technical standpoint he should have obstruction of a bite block to depress the tongue which would allow [place] to spare his palate. He would still need  irradiation of his left neck.   PLAN:  I will discuss his surgical findings with Dr. Ezzard Standing. I will set up consultations with Dr. Kristin Bruins and also Dr. Gaylyn Rong. I just spoke on the phone  with Dr. Ovidio Kin and he does not feel that there is residual disease within his bone, therefore there would be little benefit with resection of his mandible.   I spent 60 minutes minutes face to face with the patient and more than 50% of that time was spent in counseling and/or coordination of care.

## 2011-08-18 NOTE — Progress Notes (Signed)
PT SAYS WOUND ON NECK HAS OPENED AND BLOOD WAS DRAINING OUT, HAS GOTTEN WORSE IN LAST COUPLE OF DAYS.  CALLED DR. Allene Pyo OFFICE AND HAS APPOINTMENT WITH HIM TODAY AT 3:45.  FEELS LIKE THERE IS SOMETHING PUSHING IN AT THE CENTER OF HIS THROAT AND IS A LITTLE HARDER TO SWALLOW SINCE SURGERY.

## 2011-08-19 ENCOUNTER — Other Ambulatory Visit: Payer: Self-pay | Admitting: Oncology

## 2011-08-19 DIAGNOSIS — C029 Malignant neoplasm of tongue, unspecified: Secondary | ICD-10-CM

## 2011-08-22 NOTE — Progress Notes (Signed)
Encounter addended by: Delynn Flavin, RN on: 08/22/2011  9:14 PM<BR>     Documentation filed: Orders, Inpatient Patient Education

## 2011-08-23 ENCOUNTER — Telehealth: Payer: Self-pay | Admitting: Oncology

## 2011-08-23 NOTE — Telephone Encounter (Signed)
S/w the pt and he is aware of the new pt appt with dr ha on 08/26/2011@10 :30am

## 2011-08-25 ENCOUNTER — Encounter (HOSPITAL_COMMUNITY): Payer: Self-pay | Admitting: Dentistry

## 2011-08-25 ENCOUNTER — Ambulatory Visit (HOSPITAL_COMMUNITY): Payer: Self-pay | Admitting: Dentistry

## 2011-08-25 DIAGNOSIS — K036 Deposits [accretions] on teeth: Secondary | ICD-10-CM

## 2011-08-25 DIAGNOSIS — Z0189 Encounter for other specified special examinations: Secondary | ICD-10-CM

## 2011-08-25 DIAGNOSIS — M270 Developmental disorders of jaws: Secondary | ICD-10-CM

## 2011-08-25 DIAGNOSIS — K08109 Complete loss of teeth, unspecified cause, unspecified class: Secondary | ICD-10-CM

## 2011-08-25 DIAGNOSIS — C021 Malignant neoplasm of border of tongue: Secondary | ICD-10-CM

## 2011-08-25 NOTE — Patient Instructions (Signed)

## 2011-08-25 NOTE — Progress Notes (Signed)
, DENTAL CONSULTATION  08/25/2011 [name] Date of Birth:  07-Jun-1961  MRN:   [id number]  VITALS: BP 156/94  Pulse 95  Temp(Src) 98 F (36.7 C) (Oral)  LABS: Lab Results  Component Value Date   WBC 6.9 07/26/2011   HGB 12.5* 07/26/2011   HCT 37.3* 07/26/2011   MCV 94.7 07/26/2011   PLT 212 07/26/2011      Component Value Date/Time   NA 142 07/26/2011 0951   K 4.1 07/26/2011 0951   CL 106 07/26/2011 0951   CO2 28 07/26/2011 0951   GLUCOSE 97 07/26/2011 0951   BUN 10 07/26/2011 0951   CREATININE 0.84 07/26/2011 0951   CALCIUM 9.8 07/26/2011 0951   GFRNONAA >90 07/26/2011 0951   GFRAA >90 07/26/2011 0951    Chief Complaint: Patient needs a pre-chemoradiation therapy dental protocol evaluation.  HPI: Armend Hochstatter is a 50 year old male referred by Dr. Chipper Herb for a dental consultation. Patient with recent diagnosis of squamous cell carcinoma of the left lateral tongue. Initial resection was on 12/23/2010. Patient subsequently developed neck metastases. Patient then underwent neck mass resection along with radical neck dissection on 07/27/2011 Dr. Narda Bonds. Patient now with anticipated chemoradiation therapy. Patient now seen as part of a pre-chemoradiation therapy dental protocol evaluation.   PMH: Past Medical History  Diagnosis Date  . Hemorrhoids   . PONV (postoperative nausea and vomiting)   . Hypertension     no medications at present  . GERD (gastroesophageal reflux disease)     no medication  . Depression     treated approx 12 years ago, no treatment at this time  . Hiatal hernia   . Cancer     left side of tongue T1 lesion excised 4/12    ALLERGIES: Allergies  Allergen Reactions  . Codeine Phosphate     REACTION: rash, swelling    MEDICATIONS: Current Outpatient Prescriptions  Medication Sig Dispense Refill  . HYDROcodone-acetaminophen (VICODIN) 5-500 MG per tablet Take 1 tablet by mouth every 6 (six) hours as needed.         Marland Kitchen ibuprofen (ADVIL,MOTRIN) 200 MG tablet Take 200 mg by mouth every 6 (six) hours as needed.        . loratadine (CLARITIN) 10 MG tablet Take 10 mg by mouth daily as needed. FOR ALLERGIES         SOCIAL HISTORY: History   Social History  . Marital Status: Divorced    Spouse Name: N/A    Number of Children: N/A  . Years of Education: N/A   Occupational History  . Not on file.   Social History Main Topics  . Smoking status: Former Smoker -- 0.2 packs/day for 20 years    Types: Cigarettes    Quit date: 07/13/2011  . Smokeless tobacco: Never Used  . Alcohol Use: Yes     approx. 2 beers or wine per week  . Drug Use: No  . Sexually Active: Not on file   Other Topics Concern  . Not on file   Social History Narrative  . No narrative on file    FAMILY HISTORY: Family History  Problem Relation Age of Onset  . Adopted: Yes  . Other      biological parents may have had cerebral aneurysm     REVIEW OF SYSTEMS: Reviewed from e-chart for this patient.admission.  DENTAL HISTORY: Chief Complaint: Patient needs a pre-chemoradiation therapy dental protocol evaluation.  HPI: Tavaris Eudy is a 50 year old male referred by Dr.  Chipper Herb for a dental consultation. Patient with recent diagnosis of squamous cell carcinoma of the left lateral tongue. Initial resection was on 12/23/2010. Patient subsequently developed neck metastases. Patient then underwent neck mass resection along with radical neck dissection on 07/27/2011 Dr. Narda Bonds. Patient now with anticipated chemoradiation therapy. Patient now seen as part of a pre-chemoradiation therapy dental protocol evaluation.   DENTAL EXAMINATION:  GENERAL: Patient is a well-developed, well-nourished male in no acute distress. HEAD AND NECK: There is no right neck lymphadenopathy noted. Left neck is consistent with previous neck dissection. INTRAORAL EXAM: Patient has normal saliva. Patient has a palatal torus. Patient  has a mandibular right lingual torus. There is no evidence of vastus formation within the mouth. The left lateral tongue is consistent with previous hemiglossectomy. DENTITION: There are multiple missing teeth to patient is missing tooth numbers 1, 5, 12, 18, 17, 20, 29, and 32. Spaces have been closed secondary to previous orthodontic therapy. PERIODONTAL: There is chronic periodontitis with plaque and calculus accumulations, selective areas of gingival recession, and no obvious tooth mobility. There is incipient bone loss. DENTAL CARIES/SUBOPTIMAL RESTORATIONS: There are no obvious dental caries noted at this time. ENDODONTIC: Patient denies acute pulpitis symptoms. I do not see any evidence of abscess formation. CROWN AND BRIDGE: Crown tooth #30 that appears to be acceptable. PROSTHODONTIC: No history of partial dentures. OCCLUSION: Patient with a stable occlusion at this time. Patient is status post orthodontic therapy  RADIOGRAPHIC INTERPRETATION: A panoramic x-ray was obtained along with a full series of dental radiographs There are multiple missing teeth. Spaces have been closed with orthodontic therapy. There are no obvious dental caries. There are no obvious periapical radiolucencies. There is incipient bone loss.  ASSESSMENTS: 1. Chronic periodontitis with incipient bone loss 2. Minimal plaque accumulations 3. Selective areas of gingival recession 4. No significant tooth mobility. 5. Multiple missing teeth with spaces closed with previous orthodontic therapy. 6. Stable occlusion 7. Mandibular right lingual torus 8. Small palatal torus. 9. Left lateral tongue consistent with previous hemi-glossectomy 10. Anticipated radiation therapy to involve mandibular left teeth up to the midline.  PLAN/RECOMMENDATIONS: 1. I discussed the risks, benefits, and complications of various treatment options with the patient in relationship to his medical and dental conditions anticipated  chemoradiation therapy and chemoradiation therapy side effects to include xerostomia, radiation caries, mucositis, taste changes, gum and jawbone changes, and risk for infection bleeding and osteoradionecrosis. We discussed various treatment options to include no treatment, selective extractions teeth in primary field radiation therapy, pre-prosthetic surgery as indicated, periodontal therapy, dental restorations, root canal therapy, crown and bridge therapy, implant therapy, and replacement missing teeth is indicated proximally 3 months after radiation therapy is complete. We also discussed fabrication of a tongue positioner as well as fluoride trays. Patient currently wishes to be referred to an oral surgeon for a second opinion. This is been scheduled with Dr. Cherly Anderson for Friday at 2:30 PM. He will be evaluated for extraction of tooth numbers 18, 19, 21, 22, 23, 24 with alveoloplasty and possible mandibular right lingual torus reduction. 2. Discussion of  findings with medical and radiation oncologist and coordination of future chemoradiation therapy as indicated.  Dr. Cindra Eves

## 2011-08-26 ENCOUNTER — Telehealth: Payer: Self-pay | Admitting: Oncology

## 2011-08-26 ENCOUNTER — Ambulatory Visit (HOSPITAL_BASED_OUTPATIENT_CLINIC_OR_DEPARTMENT_OTHER): Payer: Self-pay

## 2011-08-26 ENCOUNTER — Ambulatory Visit (HOSPITAL_BASED_OUTPATIENT_CLINIC_OR_DEPARTMENT_OTHER): Payer: Self-pay | Admitting: Oncology

## 2011-08-26 ENCOUNTER — Other Ambulatory Visit: Payer: Self-pay | Admitting: Oncology

## 2011-08-26 ENCOUNTER — Other Ambulatory Visit: Payer: Self-pay | Admitting: Lab

## 2011-08-26 VITALS — BP 151/101 | HR 105 | Temp 97.2°F | Ht 72.0 in | Wt 193.4 lb

## 2011-08-26 DIAGNOSIS — C029 Malignant neoplasm of tongue, unspecified: Secondary | ICD-10-CM

## 2011-08-26 DIAGNOSIS — C021 Malignant neoplasm of border of tongue: Secondary | ICD-10-CM

## 2011-08-26 DIAGNOSIS — C770 Secondary and unspecified malignant neoplasm of lymph nodes of head, face and neck: Secondary | ICD-10-CM

## 2011-08-26 LAB — CBC WITH DIFFERENTIAL/PLATELET
Basophils Absolute: 0 10*3/uL (ref 0.0–0.1)
HCT: 36.6 % — ABNORMAL LOW (ref 38.4–49.9)
HGB: 12.9 g/dL — ABNORMAL LOW (ref 13.0–17.1)
MONO#: 0.5 10*3/uL (ref 0.1–0.9)
NEUT%: 60 % (ref 39.0–75.0)
WBC: 6.4 10*3/uL (ref 4.0–10.3)
lymph#: 1.9 10*3/uL (ref 0.9–3.3)

## 2011-08-26 LAB — COMPREHENSIVE METABOLIC PANEL
BUN: 15 mg/dL (ref 6–23)
CO2: 24 mEq/L (ref 19–32)
Calcium: 9.7 mg/dL (ref 8.4–10.5)
Chloride: 105 mEq/L (ref 96–112)
Creatinine, Ser: 1.22 mg/dL (ref 0.50–1.35)
Glucose, Bld: 85 mg/dL (ref 70–99)

## 2011-08-26 MED ORDER — PROCHLORPERAZINE MALEATE 10 MG PO TABS: 10.0000 mg | ORAL_TABLET | Freq: Four times a day (QID) | ORAL | Status: DC | PRN

## 2011-08-26 MED ORDER — LORAZEPAM 0.5 MG PO TABS: 0.5000 mg | ORAL_TABLET | Freq: Four times a day (QID) | ORAL | Status: DC | PRN

## 2011-08-26 MED ORDER — ONDANSETRON HCL 8 MG PO TABS: ORAL_TABLET | ORAL | Status: DC

## 2011-08-26 NOTE — Telephone Encounter (Signed)
gve the pt his dec,jan 2013 appt calendar. Pt is aware he will be clled with the appt regarding the peg tube from tina at [facility]

## 2011-08-26 NOTE — Telephone Encounter (Signed)
Patient came in today as a new patient,he has no insurance, so I gave him an Programmer, systems.

## 2011-08-26 NOTE — Progress Notes (Signed)
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    HPI:  I personally reviewed the extensive medical records.  Thomas Bean is a 50 year old Caucasian American gentleman with history of smoking cigarettes. 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  He has been in usual health until he noticed a left submandibular gland in August 2012. This underwent needle biopsy which was consistent with squamous cell carcinoma. He underwent PET scan on 07/25/2011 which I reviewed myself personally and showed the pictures to the patient. The scan this showed uptake in the left submandibular gland however no evidence of disease elsewhere. He underwent on 07/27/2011 radical left neck dissection by Dr. Ezzard Standing and Dr. Geryl Rankins. Pathology case number [id number] showed total 15 lymph nodes from left radical neck dissection that was all negative. However the left submandibular gland mass showed a 3.5 cm invasive moderately English differentiates him a sub-carcinoma involving the adjacent skeletal muscle tissue and bone a tissue and extending into the inked margin. There was no evidence of any lymphatic invasion or perineural invasion.  He was kindly referred for evaluation of adjuvant chemotherapy. He had already seen Dr. Dayton Scrape from radiation oncology who recommended adjuvant radiation therapy.   Thomas Bean presented to the clinic by himself today for the first time. He reported that he has some mild discomfort in the left neck from the recent resection. However the dissection scar has healed up nicely without any erythema, purulent discharge.  He does not feel any other adenopathy.  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    Patient denies headache, visual changes, confusion, drenching night  sweats, palpable lymph node swelling, mucositis, odynophagia, dysphagia, nausea vomiting, jaundice, chest pain, palpitation, shortness of breath, dyspnea on exertion, productive cough, gum bleeding, epistaxis, hematemesis, hemoptysis, abdominal pain, abdominal swelling, early satiety, melena, hematochezia, hematuria, skin rash, spontaneous bleeding, joint swelling, joint pain, heat or cold intolerance, bowel bladder incontinence, back pain, focal motor weakness, paresthesia, depression, suicidal or homocidal ideation, feeling hopelessness.     Past Medical History  Diagnosis Date  . Hemorrhoids   . PONV (postoperative nausea and vomiting)   . Hypertension     no medications at present  . GERD (gastroesophageal reflux disease)     no medication  . Depression     treated approx 12 years ago, no treatment at this time  . Hiatal hernia   . Cancer     left side of tongue T1 lesion excised 4/12  :  Past Surgical History  Procedure Date  . Inguinal herniorrhapy   . Tonsillectomy and adenoidectomy   . Excision of tongue mass     April 2012  . Hernia repair   . Neck mass excision 07/27/2011  . Radical neck dissection 07/27/2011    Procedure: RADICAL NECK DISSECTION;  Surgeon: Drema Halon, MD;  Location: [facility] OR;  Service: ENT;  Laterality: Left;  :  Current outpatient prescriptions:HYDROcodone-acetaminophen (VICODIN) 5-500 MG per tablet, Take 1 tablet by mouth every 6 (six) hours as needed. , Disp: , Rfl: ;  ibuprofen (ADVIL,MOTRIN) 200 MG tablet, Take 200 mg by mouth every 6 (six) hours as needed.  , Disp: , Rfl: ;  loratadine (CLARITIN) 10 MG tablet, Take 10 mg by mouth daily as needed. FOR ALLERGIES , Disp: , Rfl:  LORazepam (ATIVAN) 0.5 MG tablet, Take 1 tablet (0.5 mg total) by mouth every 6 (six) hours as needed (Nausea or vomiting)., Disp: 30 tablet, Rfl: 0;  ondansetron (ZOFRAN) 8 MG tablet, Take 1 tablet two times a day as needed for nausea or vomiting starting on the third  day after chemotherapy., Disp: 30 tablet, Rfl: 1 prochlorperazine (COMPAZINE) 10 MG tablet, Take 1 tablet (10 mg total) by mouth every 6 (six) hours as needed (Nausea or vomiting)., Disp: 30 tablet, Rfl: 1:    :  Allergies  Allergen Reactions  . Codeine Phosphate     REACTION: rash, swelling  :  Family History  Problem Relation Age of Onset  . Adopted: Yes  . Other      biological parents may have had cerebral aneurysm   :  History   Social History  . Marital Status: Divorced    Spouse Name: N/A    Number of Children: N/A  . Years of Education: N/A   Occupational History  . Not on file.   Social History Main Topics  . Smoking status: Former Smoker -- 0.2 packs/day for 20 years    Types: Cigarettes    Quit date: 07/13/2011  . Smokeless tobacco: Never Used  . Alcohol Use: Yes     approx. 2 beers or wine per week  . Drug Use: No  . Sexually Active: Not on file   Other Topics Concern  . Not on file   Social History Narrative  . No narrative on file    Pertinent items are noted in HPI.  Exam:  General:  well-nourished in no acute distress.  Eyes:  no scleral icterus.  ENT:  There were no oropharyngeal lesions.  His left lateral tongue had a scar that was well healed.  I could not appreciate any palpable tongue mass.  Neck was without thyromegaly.  There was left neck dissection scar that was well healed.  Lymphatics:  Negative cervical, supraclavicular or axillary adenopathy.  Respiratory: lungs were clear bilaterally without wheezing or crackles.  Cardiovascular:  Regular rate and rhythm, S1/S2, without murmur, rub or gallop.  There was no pedal edema.  GI:  abdomen was soft, flat, nontender, nondistended, without organomegaly.  Muscoloskeletal:  no spinal tenderness of palpation of vertebral spine.  Skin exam was without echymosis, petichae.  Neuro exam was nonfocal.  Patient was able to get on and off exam table without assistance.  Gait was normal.  Patient was  alerted and oriented.  Attention was good.   Language was appropriate.  Mood was normal without depression.  Speech was not pressured.  Thought content was not tangential.     Basename 08/26/11 1019  WBC 6.4  HGB 12.9*  HCT 36.6*  PLT 204    Basename 08/26/11 1019  NA 139  K 4.3  CL 105  CO2 24  GLUCOSE 85  BUN 15  CREATININE 1.22  CALCIUM 9.7    Assessment and Plan:   1.  Smoking:  He had given up smoking. I discussed with him the high risk of recurrent cancer if he picks up smoking again. 2.  Recurrent left lateral tongue squamous cell carcinoma: Status post primary resection and now with left submandibular gland recurrence.  I discussed with Thomas Bean that without further adjuvant chemoradiation therapy, he has high risk of recurrence upper to 50%. Randomized clinical trials have shown patients who have close margin would benefit from chemoradiation therapy to decrease her risk of local and also  distal recurrence.  I discussed with him that chemotherapy is given in the form of cisplatin intravenously once every 3 weeks for 3 doses total along with daily radiation therapy.  I discussed with patient side effects of cisplatin which include but not limited to nausea vomiting, alopecia, fatigue, mucositis, ototoxicity, nephrotoxicity, abnormal electrolytes, cytopenia, risk of bleeding and infection. He expressed informed understanding and wish to pursue adjuvant chemoradiation therapy.  In preparation for chemoradiation therapy, he had already been seen by a dentist. He is due to have some dental extraction next week. I referred him to [facility] to get a baseline audiogram to assess his hearing capacity. I referred him to chemotherapy plus with the next 2 weeks. I prescribed him Compazine, Zofran, Ativan when necessary for nausea or vomiting.  I discussed with him the recent benefit of PEG tube placement since he expects to have severe mucositis with chemoradiation therapy. He  expressed informed understanding wished to pursue this now. I referred him to interventional radiology for PEG tube placement within 2 weeks.   I discussed with him the potential problem with esophageal stricture with therapy. I referred him to speech pathology for evaluation and exercise to decrease her risk of esophageal stricture.  I will see him in early January 2013 to go over any last minute question prior to start radiation therapy and chemotherapy around that time.  The length of time of the face-to-face encounter was 45  minutes. More than 50% of time was spent counseling and coordination of care.

## 2011-08-29 ENCOUNTER — Encounter (HOSPITAL_COMMUNITY): Payer: Self-pay | Admitting: Pharmacy Technician

## 2011-09-02 ENCOUNTER — Ambulatory Visit (HOSPITAL_COMMUNITY): Payer: Self-pay | Admitting: Dentistry

## 2011-09-02 ENCOUNTER — Other Ambulatory Visit: Payer: Self-pay | Admitting: Oncology

## 2011-09-02 ENCOUNTER — Ambulatory Visit (HOSPITAL_COMMUNITY): Admission: RE | Admit: 2011-09-02 | Payer: Self-pay | Source: Ambulatory Visit

## 2011-09-02 VITALS — BP 163/84 | HR 73 | Temp 98.3°F

## 2011-09-02 DIAGNOSIS — Z0189 Encounter for other specified special examinations: Secondary | ICD-10-CM

## 2011-09-02 DIAGNOSIS — K08109 Complete loss of teeth, unspecified cause, unspecified class: Secondary | ICD-10-CM

## 2011-09-02 DIAGNOSIS — K08409 Partial loss of teeth, unspecified cause, unspecified class: Secondary | ICD-10-CM

## 2011-09-02 DIAGNOSIS — Z463 Encounter for fitting and adjustment of dental prosthetic device: Secondary | ICD-10-CM

## 2011-09-02 NOTE — Patient Instructions (Signed)
FLUORIDE TRAYS PATIENT INSTRUCTIONS    Obtain prescription from the pharmacy.  Don't be surprised if it needs to be ordered.   Be sure to let the pharmacy know when you are close to needing a new refill for them to have it ready for you without interruption of Fluoride use.   The best time to use your Fluoride is before bed time.   You must brush your teeth very well and floss before using the Fluoride in order to get the best use out of the Fluoride treatments.   Place 1 drop of Fluoride gel per tooth in the tray.   Place the tray on your lower teeth and/or your upper teeth.  Make sure the trays are seated all the way.  Remember, they only fit one way on your teeth.   Insert for 5 full minutes.   At the end of the 5 minutes, take the trays out.  SPIT OUT excess. .    Do NOT rinse your mouth!    Do NOT eat or drink after treatments for at least 30 minutes.  This is why the best time for your treatments is before bedtime.    Clean the inside of your Fluoride trays using COLD WATER and a toothbrush.    In order to keep your Trays from discoloring and free from odors, soak them overnight in denture cleaners such as Efferdent.  Do not use bleach or non denture products.    Store the trays in a safe dry place AWAY from any heat until your next treatment.    Bring the trays with you for your next dental check-up.  The dentist will confirm their fit.    If anything happens to your Fluoride trays, or they don't fit as well after any dental work, please let us know as soon as possible.  

## 2011-09-02 NOTE — Progress Notes (Signed)
Friday, September 02, 2011   BP: 163/84        P: 73          T: 98.3   Thomas Bean presents for insertion of upper and lower fluoride trays and completion of the fabrication of radiation cone locator/tongue positioner. Subjective: Patient is not complaining of any problems with the extractions from Dr. Retta Mac.  Exam: Extraction sites appear to be healing in well with generalized primary closure. Sutures are intact. There are no signs of infection heme or ooze. Assessment: Healing in progressing well. Plan: Patient agrees to proceed with fabrication of radiation cone locator as well as the insertion of the fluoride trays.  Procedure: Fluoride trays were tried in and were adjusted prn. [place]. Postop instructions were provided in a written and verbal format on the use and care of appliances. All questions answered. Initial tongue positioner was tried in. Good stability of the mandibular right occlusion was noted. Additional resin buildup of a block to open the mouth was placed on the right side. This was cured appropriately. Appliance then taken to the Triad machine and further curing took place for an additional 2 minutes. Then the additional incorporation of acrylic was adjusted and polished appropriately. The appliance was then reintroduced in the mouth and additional stability was obtained with the addition of further acrylic with curing in place. Additional curing took place in the triad machine as well Instructions were provided on placing the appliance in and out of the mouth appropriately. Further adjustments were made as indicated per patient request. The appliance was then cured once again and polished appropriately. The appliance was then taken down to radiation oncology for stimulation procedure. Dr. Dayton Scrape was contacted and will schedule patient for simulation appointment early next week as schedule and time permits. Patient should be able to start radiation therapy  approximately 09/14/2010 if no complications arise in the healing from the dental extractions. Ultimate decision will be based upon further input from Dr. Retta Mac as well as discussion with Dr. Dayton Scrape. Patient will RTC for periodic oral exam during radiation therapy in approximately 3 weeks.  Patient to call if questions or problems before then. Patient dismissed in stable condition. Dr. Kristin Bruins

## 2011-09-05 ENCOUNTER — Other Ambulatory Visit (HOSPITAL_COMMUNITY): Payer: Self-pay | Admitting: Physician Assistant

## 2011-09-08 ENCOUNTER — Encounter: Payer: Self-pay | Admitting: *Deleted

## 2011-09-08 ENCOUNTER — Ambulatory Visit (HOSPITAL_COMMUNITY)
Admission: RE | Admit: 2011-09-08 | Discharge: 2011-09-08 | Disposition: A | Discharge status: Home / Self Care | Payer: Medicaid Other | Source: Ambulatory Visit | Attending: Oncology | Admitting: Oncology

## 2011-09-08 ENCOUNTER — Ambulatory Visit: Payer: Medicaid Other

## 2011-09-08 ENCOUNTER — Ambulatory Visit
Admission: RE | Admit: 2011-09-08 | Discharge: 2011-09-08 | Disposition: A | Discharge status: Home / Self Care | Payer: Medicaid Other | Source: Ambulatory Visit | Attending: Radiation Oncology | Admitting: Radiation Oncology

## 2011-09-08 ENCOUNTER — Other Ambulatory Visit: Payer: PRIVATE HEALTH INSURANCE

## 2011-09-08 DIAGNOSIS — C029 Malignant neoplasm of tongue, unspecified: Secondary | ICD-10-CM | POA: Insufficient documentation

## 2011-09-08 DIAGNOSIS — K219 Gastro-esophageal reflux disease without esophagitis: Secondary | ICD-10-CM | POA: Insufficient documentation

## 2011-09-08 DIAGNOSIS — I10 Essential (primary) hypertension: Secondary | ICD-10-CM | POA: Insufficient documentation

## 2011-09-08 MED ORDER — SODIUM CHLORIDE 0.9 % IV SOLN
INTRAVENOUS | Status: DC
  Administered 2011-09-08: 12:00:00 via INTRAVENOUS

## 2011-09-08 MED ORDER — CEFAZOLIN SODIUM 1-5 GM-% IV SOLN
1.0000 g | Freq: Once | INTRAVENOUS | Status: AC
  Administered 2011-09-08: 1 g via INTRAVENOUS
  Filled 2011-09-08: qty 50

## 2011-09-08 MED ORDER — HYDROMORPHONE HCL PF 2 MG/ML IJ SOLN: 1.0000 mg | INTRAMUSCULAR | Status: DC | PRN

## 2011-09-08 MED ORDER — LIDOCAINE HCL 1 % IJ SOLN
INTRAMUSCULAR | Status: AC
  Filled 2011-09-08: qty 20

## 2011-09-08 MED ORDER — LORAZEPAM 2 MG/ML IJ SOLN: INTRAMUSCULAR | Status: AC | PRN

## 2011-09-08 MED ORDER — FENTANYL CITRATE 0.05 MG/ML IJ SOLN
INTRAMUSCULAR | Status: AC
  Filled 2011-09-08: qty 4

## 2011-09-08 MED ORDER — HYDROMORPHONE HCL PF 1 MG/ML IJ SOLN
INTRAMUSCULAR | Status: AC
  Administered 2011-09-08: 1 mg
  Filled 2011-09-08: qty 1

## 2011-09-08 MED ORDER — MIDAZOLAM HCL 2 MG/2ML IJ SOLN
INTRAMUSCULAR | Status: AC
  Filled 2011-09-08: qty 4

## 2011-09-08 MED ORDER — MIDAZOLAM HCL 5 MG/5ML IJ SOLN
INTRAMUSCULAR | Status: AC | PRN
  Administered 2011-09-08 (×2): 2 mg via INTRAVENOUS

## 2011-09-08 MED ORDER — FENTANYL CITRATE 0.05 MG/ML IJ SOLN
INTRAMUSCULAR | Status: AC | PRN
  Administered 2011-09-08 (×2): 50 ug via INTRAVENOUS
  Administered 2011-09-08 (×2): 100 ug via INTRAVENOUS

## 2011-09-08 MED FILL — Midazolam HCl Inj 5 MG/5ML (Base Equivalent): INTRAMUSCULAR | Qty: 5 | Status: AC

## 2011-09-08 MED FILL — Fentanyl Citrate Inj 0.05 MG/ML: INTRAMUSCULAR | Qty: 1 | Status: AC

## 2011-09-08 NOTE — Procedures (Signed)
Placement of 20 Fr percutaneous gastrostomy tube.  No immediate complication.  Recover for 3 hours prior to discharge home.

## 2011-09-08 NOTE — H&P (Signed)
Thomas Bean is an 50 y.o. male.   Chief Complaint:"I'm here for a stomach tube" HPI: Patient with history of squamous cell cancer of tongue presents today for gastrostomy tube placement prior to chemoradiation.  Past Medical History  Diagnosis Date  . Hemorrhoids   . PONV (postoperative nausea and vomiting)   . Hypertension     no medications at present  . GERD (gastroesophageal reflux disease)     no medication  . Depression     treated approx 12 years ago, no treatment at this time  . Hiatal hernia   . Cancer     left side of tongue T1 lesion excised 4/12    Past Surgical History  Procedure Date  . Inguinal herniorrhapy   . Tonsillectomy and adenoidectomy   . Excision of tongue mass     April 2012  . Hernia repair   . Neck mass excision 07/27/2011  . Radical neck dissection 07/27/2011    Procedure: RADICAL NECK DISSECTION;  Surgeon: Drema Halon, MD;  Location: [facility] OR;  Service: ENT;  Laterality: Left;  dental extractions  Family History  Problem Relation Age of Onset  . Adopted: Yes  . Other      biological parents may have had cerebral aneurysm    Social History:  reports that he quit smoking about 8 weeks ago. His smoking use included Cigarettes. He has a 5 pack-year smoking history. He has never used smokeless tobacco. He reports that he drinks alcohol. He reports that he does not use illicit drugs.  Allergies:  Allergies  Allergen Reactions  . Codeine Phosphate     REACTION: rash, swelling    Medications Prior to Admission  Medication Sig Dispense Refill  . amoxicillin (AMOXIL) 875 MG tablet Take 875 mg by mouth 2 (two) times daily.        . chlorhexidine (PERIDEX) 0.12 % solution Use as directed 15 mLs in the mouth or throat 2 (two) times daily.        Marland Kitchen HYDROcodone-acetaminophen (VICODIN) 5-500 MG per tablet Take 1 tablet by mouth every 6 (six) hours as needed. Pain       . ibuprofen (ADVIL,MOTRIN) 200 MG tablet Take 400 mg by mouth every 6  (six) hours as needed. Pain       . loratadine (CLARITIN) 10 MG tablet Take 10 mg by mouth daily as needed. FOR ALLERGIES       . LORazepam (ATIVAN) 0.5 MG tablet Take 1 tablet (0.5 mg total) by mouth every 6 (six) hours as needed (Nausea or vomiting).  30 tablet  0  . ondansetron (ZOFRAN) 8 MG tablet Take 1 tablet two times a day as needed for nausea or vomiting starting on the third day after chemotherapy.  30 tablet  1  . prochlorperazine (COMPAZINE) 10 MG tablet Take 1 tablet (10 mg total) by mouth every 6 (six) hours as needed (Nausea or vomiting).  30 tablet  1   Medications Prior to Admission  Medication Dose Route Frequency Provider Last Rate Last Dose  . 0.9 %  sodium chloride infusion   Intravenous Continuous Anselm Pancoast, PA      . ceFAZolin (ANCEF) IVPB 1 g/50 mL premix  1 g Intravenous Once Anselm Pancoast, PA        Results for orders placed in visit on 08/26/11  COMPREHENSIVE METABOLIC PANEL      Component Value Range   Sodium 139  135 - 145 (mEq/L)   Potassium  4.3  3.5 - 5.3 (mEq/L)   Chloride 105  96 - 112 (mEq/L)   CO2 24  19 - 32 (mEq/L)   Glucose, Bld 85  70 - 99 (mg/dL)   BUN 15  6 - 23 (mg/dL)   Creatinine, Ser 1.61  0.50 - 1.35 (mg/dL)   Total Bilirubin 0.4  0.3 - 1.2 (mg/dL)   Alkaline Phosphatase 59  39 - 117 (U/L)   AST 17  0 - 37 (U/L)   ALT 15  0 - 53 (U/L)   Total Protein 6.8  6.0 - 8.3 (g/dL)   Albumin 4.4  3.5 - 5.2 (g/dL)   Calcium 9.7  8.4 - 09.6 (mg/dL)  04/54/0981    WBC 6.4   HGB  12.9  PLT 204K  Review of Systems  Constitutional: Negative for fever and chills.  HENT:       Gum pain following teeth extractions  Respiratory: Negative for cough and shortness of breath.   Cardiovascular: Negative for chest pain.  Gastrointestinal: Negative for nausea, vomiting and abdominal pain.  Neurological: Negative for headaches.  Endo/Heme/Allergies: Does not bruise/bleed easily.    Blood pressure 151/88, pulse 89, temperature 98.4 F (36.9  C), temperature source Oral, resp. rate 16, height 6' (1.829 m), weight 193 lb (87.544 kg), SpO2 99.00%. Physical Exam  Constitutional: He is oriented to person, place, and time. He appears well-developed and well-nourished.  Cardiovascular: Normal rate and regular rhythm.   Respiratory: Effort normal and breath sounds normal.  GI: Soft. Bowel sounds are normal. He exhibits no distension. There is no tenderness.  Musculoskeletal: Normal range of motion. He exhibits no edema.  Neurological: He is alert and oriented to person, place, and time.     Assessment/Plan Patient with squamous cell cancer of tongue; plan is for percutaneous gastrostomy tube placement prior to start of chemoradiation.  Thomas Bean,D KEVIN 09/08/2011, 12:15 PM

## 2011-09-08 NOTE — Progress Notes (Signed)
Thomas Bean c  [facility] here for g-tube teaching  They will also be contacting pt for time to arrive at his home tomorrow for further teaching.  Pt verbalizes understanding

## 2011-09-09 NOTE — H&P (Signed)
Agree with PA note. 

## 2011-09-09 NOTE — Progress Notes (Signed)
CC:   Thomas Bean. Thomas Bean, M.D. Thomas Bean, D.D.S. Thomas Bean, M.D. Thomas Rexene Edison Swords, MD  Thomas Bean is seen today prior to his CT simulation.  Soon after his left mandibular extractions, he noted development of a firm mass along the mandible.  He was placed on antibiotics by Dr. Retta Mac.  On examination today, his left mandibular traction sites are healing well. There is no exposed bone.  On palpation of left neck is a 1.5 cm firm mass fixed to the left anterolateral mandible inferiorly.  There is no fluctuance.  IMPRESSION:  The timing of this would suggest an infection or hematoma, however the firmness of this mass along with its location made me concerned about the possibility of persistent/recurrent disease.  If this is persistent/recurrent disease, then he would obviously need to have surgical resection with a partial mandibulectomy prior to postoperative radiation therapy.  I will speak with Dr. Ezzard Bean to see if he would like to perform a needle aspiration biopsy to expedite his management.    ______________________________ Maryln Gottron, M.D. RJM/MEDQ  D:  09/08/2011  T:  09/08/2011  Job:  [id number]

## 2011-09-09 NOTE — Progress Notes (Signed)
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  A special bite block was constructed by Dr. Kristin Bruins.  A head/shoulder cast was constructed for immobilization.  Prior to construction of the head cast, I marked his left neck scars with radiopaque wire.  He was then scanned.  I am prescribing 6000 cGy in 30 sessions to his left upper neck PTV.  He is to be treated with helical IMRT tomotherapy.  He will also undergo concomitant chemotherapy under the direction of Dr. Gaylyn Rong.  We can expect increased toxicity in terms of mucositis, which will require nutritional consultation and close monitoring of his hydration and blood counts.  This constitutes a special treatment procedure.  As mentioned in a dictation earlier, there is a mass along his left mandible which will be biopsied prior to initiation of radiation therapy.    ______________________________ Maryln Gottron, M.D. RJM/MEDQ  D:  09/08/2011  T:  09/08/2011  Job:  [id number]

## 2011-09-10 ENCOUNTER — Encounter: Payer: Self-pay | Admitting: Radiation Oncology

## 2011-09-12 ENCOUNTER — Other Ambulatory Visit (HOSPITAL_COMMUNITY)
Admission: RE | Admit: 2011-09-12 | Discharge: 2011-09-12 | Disposition: A | Discharge status: Home / Self Care | Payer: Medicaid Other | Source: Ambulatory Visit | Attending: Otolaryngology | Admitting: Otolaryngology

## 2011-09-12 DIAGNOSIS — R220 Localized swelling, mass and lump, head: Secondary | ICD-10-CM | POA: Insufficient documentation

## 2011-09-12 DIAGNOSIS — R221 Localized swelling, mass and lump, neck: Secondary | ICD-10-CM | POA: Insufficient documentation

## 2011-09-13 DIAGNOSIS — Z9221 Personal history of antineoplastic chemotherapy: Secondary | ICD-10-CM

## 2011-09-13 HISTORY — DX: Personal history of antineoplastic chemotherapy: Z92.21

## 2011-09-13 HISTORY — PX: MANDIBULECTOMY: SHX1999

## 2011-09-15 ENCOUNTER — Telehealth: Payer: Self-pay | Admitting: Oncology

## 2011-09-15 ENCOUNTER — Ambulatory Visit: Payer: Medicaid Other | Admitting: Radiation Oncology

## 2011-09-15 ENCOUNTER — Telehealth: Payer: Self-pay | Admitting: *Deleted

## 2011-09-15 ENCOUNTER — Other Ambulatory Visit: Payer: Self-pay | Admitting: Oncology

## 2011-09-15 DIAGNOSIS — C029 Malignant neoplasm of tongue, unspecified: Secondary | ICD-10-CM

## 2011-09-15 NOTE — Telephone Encounter (Signed)
S/w the pt and he is aware of his lab and md appt on 10/19/2011@10 :00am. R/s the appts from 09/16/2011 per md

## 2011-09-15 NOTE — Telephone Encounter (Signed)
Pt called to report she did teaching for new PEG tube, but since pt is private pay she would like to delay her next visit until pt is actually needing to use his tube for feedings.  She taught him how to flush tube daily and change dressing.  She also reports pt's skin under the disc is red and irritated d/t pt was placing dressing over disc instead of under, so she feels pt's skin was moist and he may have a yeast infection?  She cleaned area well and instructed for pt to have Dr. Gaylyn Rong look at it on his office visit tomorrow.  RN will fax new orders to hold next visit until pt needs to use PEG for feedings.

## 2011-09-16 ENCOUNTER — Other Ambulatory Visit: Payer: Self-pay | Admitting: *Deleted

## 2011-09-16 ENCOUNTER — Ambulatory Visit: Payer: Self-pay | Admitting: Oncology

## 2011-09-16 ENCOUNTER — Other Ambulatory Visit: Payer: Self-pay | Admitting: Lab

## 2011-09-16 ENCOUNTER — Encounter: Payer: Self-pay | Admitting: *Deleted

## 2011-09-16 ENCOUNTER — Ambulatory Visit: Payer: Self-pay | Admitting: Nutrition

## 2011-09-16 NOTE — Progress Notes (Signed)
Pt reports he has appt to see Dr. Mary Sella at [facility] on next [facility] 09/21/11.  Informed Dr. Gaylyn Rong.

## 2011-09-16 NOTE — Progress Notes (Signed)
Call from [facility], requests RN assess pt's skin under PEG tube as it is red per pt.. Assessed PEG tube site in exam room.  Area directly under disc is reddened w/ 2 areas or excoriation at 5 o'clock and 1 o'clock.  Redness does not extend beyond area of disc. There is some speckled red patches on pt's abd aprox 4 to 6 inches away from site which pt states was from plastic tape used after procedure and he says that redness is fading.    Pt states he was taught initially to place gauze over the disc and not under the disc.  It appears the disc itself has rubbed on pt's skin and irritated it along w/ some gastric drainage from PEG tube able to sit directly on skin w/o gauze there to protect it.  Reinforced teaching w/ pt to place gauze directly onto skin under the disc.  Instructed to use antibiotic ointment, bacitracin or neosporin on excoriated areas daily until skin heals and to change dressing daily and as needed.  Instructed to call for any worsening of the redness or any increase in drainage or any other concerns.  Applied triple antibiotic ointment to skin and placed clean, dry gauze on skin under the disc.  Secured w/ hypofix.     Instructed Dr. Gaylyn Rong of above and ordered ok to proceed as above.

## 2011-09-16 NOTE — Assessment & Plan Note (Signed)
This is a 51 year old male patient of Dr. Gaylyn Rong and Dr. Dayton Scrape, diagnosed with tongue cancer.   MEDICAL HISTORY:  Includes placement of G-tube, hypertension, GERD, depression, hiatal hernia, tobacco and alcohol usage.  MEDICATIONS:  Include Ativan, Zofran, and Compazine.  LABORATORIES:  Labs were reviewed.  HEIGHT:  72 inches. WEIGHT:  193 pounds. USUAL BODY WEIGHT:  199 pounds. BMI:  26.1.  ESTIMATED NUTRITION NEEDS:  2400 to 2600 calories, 130 to 140 g of protein, 2.6 L of fluid.  The patient reports that he is status post multiple teeth extraction.  He has had 6 teeth removed on 1 side of his mouth.  He denies trouble swallowing at this time.  He also denies any nutritional side effects, as his treatment has not really started.  He has developed an additional tumor, for which he is going to be referred to a physician at [facility], so currently his chemotherapy and radiation treatments are on hold until he has been evaluated.  However, we did spend some time today talking about potential side effects once treatment does start and usage of his feeding tube.  NUTRITION DIAGNOSIS:  Predicted suboptimal energy intake related to new diagnosis of tongue cancer and associated treatments as evidenced by a history or presence of condition for which research shows an increased incidence of suboptimal energy intake.  INTERVENTION:  I have educated the patient on the importance of small frequent meals with high-calorie, high-protein foods to promote weight maintenance.  We have discussed strategies for potential side effects to include nausea, vomiting, constipation, trouble swallowing, and dry mouth.  I have explained methods of tube feeding.  I have recommended the patient to continue to flush his G-tube with water daily.  MONITORING/EVALUATION (GOALS):  That patient will to continue to tolerate oral intake plus tube feedings to minimize weight loss to less than 5% of usual body weight.  NEXT VISIT:   Patient will call me for a followup appointment once plan of care is determined.    ______________________________ Zenovia Jarred, RD, LDN Clinical Nutrition Specialist BN/MEDQ  D:  09/16/2011  T:  09/16/2011  Job:  [id number]

## 2011-09-19 ENCOUNTER — Ambulatory Visit: Payer: Self-pay

## 2011-09-19 ENCOUNTER — Ambulatory Visit: Payer: Medicaid Other

## 2011-09-19 NOTE — Progress Notes (Signed)
Thomas Bean underwent simulation/treatment planning on 09/08/2011.  PHYSICAL EXAMINATION:  His left mandibular extraction sites appear to be healing well.  However, he did have a 1.5-cm nodule fixed to the anterolateral inferior mandible.  I am recommending a needle biopsy to make sure that this is not recurrent cancer.  We then proceeded with treatment planning/simulation.  A custom bite block was placed within the oral cavity and the position was confirmed. A head cast was constructed for immobilization.  He was then scanned.  I contoured his tumor bed including the nodule and labeled this CTV 6300. I then contoured his high risk tumor bed and labeled this CTV 6300, moderate risk tumor bed and labeled this CTV 5700, high risk lymph node bed CTV 5400, and low risk lymph node bed CTV 5100.  CTV 6300 was expanded by 0.4 cm to create PTV 6000.  The purpose was to drive the dose of 1610 into the CTV 6300 but allow a 0.4-cm expansion into the mandible.  The remainder of the CTVs were then expanded by 0.4 cm to create respective PTVs.  I also contoured the excluded right oral tongue with dosimetry, contouring the left and right parotid glands (reviewed and accepted), in addition to other normal surrounding structures.  He will receive concomitant chemotherapy and this constitutes a special treatment procedure.  He can expect increased toxicity requiring nutritional consultation and placement of a PEG feeding tube.    ______________________________ Maryln Gottron, M.D. RJM/MEDQ  D:  09/10/2011  T:  09/19/2011  Job:  [id number]

## 2011-09-20 ENCOUNTER — Ambulatory Visit: Payer: Medicaid Other

## 2011-09-20 ENCOUNTER — Other Ambulatory Visit: Payer: Self-pay | Admitting: Certified Registered Nurse Anesthetist

## 2011-09-21 ENCOUNTER — Ambulatory Visit: Payer: Medicaid Other

## 2011-09-21 ENCOUNTER — Encounter: Payer: Self-pay | Admitting: *Deleted

## 2011-09-21 NOTE — Progress Notes (Signed)
[facility] Psychosocial Distress Screening Clinical Social Work  Clinical Social Work was referred by distress screening protocol. The patient scored a 8 on the Psychosocial Distress Thermometer which indicates severe distress. Clinical Social Worker spoke with the patient to assess for distress and other psychosocial needs.  The patient states he is going to [facility] this afternoon for a consult to determine his treatment plan. The patient states he is having difficulty coping because of a new tumor forming on his throat. The patient is concerned and anxious to start treatment. Thomas Bean identifies his parents as his largest supports at this time and is currently living with them. CSW discussed connecting the patient with a peer mentor to help provide support from a cancer survivor perspective. The patient was very interested and agreed to contact CSW after [facility] appt. Kathrin Penner, MSW, [facility] Clinical Social Worker [facility] [phone]

## 2011-09-22 ENCOUNTER — Ambulatory Visit: Payer: Self-pay

## 2011-09-22 ENCOUNTER — Ambulatory Visit: Payer: Medicaid Other

## 2011-09-23 ENCOUNTER — Ambulatory Visit: Payer: Medicaid Other

## 2011-09-26 ENCOUNTER — Ambulatory Visit: Payer: Medicaid Other

## 2011-09-27 ENCOUNTER — Telehealth: Payer: Self-pay | Admitting: *Deleted

## 2011-09-27 ENCOUNTER — Ambulatory Visit: Payer: Medicaid Other

## 2011-09-27 NOTE — Telephone Encounter (Signed)
Called pt to f/u on his appt w/ Dr. Manson Passey at [facility].  Pt states he is actually scheduled for surgery this Thursday 09/29/11 at [facility].  He says he was told he may then start Radiation tx about 5 weeks post op and he has spoke w/ Dr. Dayton Scrape about returning to [facility] for XRT.   Note forwarded to Dr. Gaylyn Rong.

## 2011-09-28 ENCOUNTER — Ambulatory Visit: Payer: Medicaid Other

## 2011-09-29 ENCOUNTER — Ambulatory Visit: Payer: Medicaid Other

## 2011-09-30 ENCOUNTER — Ambulatory Visit: Payer: Medicaid Other

## 2011-10-03 ENCOUNTER — Ambulatory Visit: Payer: Medicaid Other

## 2011-10-04 ENCOUNTER — Ambulatory Visit: Payer: Medicaid Other

## 2011-10-05 ENCOUNTER — Ambulatory Visit: Payer: Medicaid Other

## 2011-10-06 ENCOUNTER — Ambulatory Visit: Payer: Medicaid Other

## 2011-10-07 ENCOUNTER — Ambulatory Visit: Payer: Medicaid Other

## 2011-10-10 ENCOUNTER — Other Ambulatory Visit: Payer: Self-pay | Admitting: Oncology

## 2011-10-10 ENCOUNTER — Ambulatory Visit: Payer: Medicaid Other

## 2011-10-11 ENCOUNTER — Ambulatory Visit: Payer: Medicaid Other

## 2011-10-11 ENCOUNTER — Telehealth: Payer: Self-pay | Admitting: Oncology

## 2011-10-11 NOTE — Telephone Encounter (Signed)
pt called and canceleld tx for 10/13/2011

## 2011-10-12 ENCOUNTER — Ambulatory Visit: Payer: Medicaid Other

## 2011-10-13 ENCOUNTER — Ambulatory Visit: Payer: Medicaid Other

## 2011-10-13 ENCOUNTER — Ambulatory Visit: Payer: Self-pay

## 2011-10-14 ENCOUNTER — Ambulatory Visit: Payer: Medicaid Other

## 2011-10-17 ENCOUNTER — Ambulatory Visit: Payer: Medicaid Other

## 2011-10-18 ENCOUNTER — Ambulatory Visit: Payer: Medicaid Other

## 2011-10-19 ENCOUNTER — Telehealth: Payer: Self-pay | Admitting: Oncology

## 2011-10-19 ENCOUNTER — Other Ambulatory Visit: Payer: Self-pay | Admitting: Lab

## 2011-10-19 ENCOUNTER — Ambulatory Visit (HOSPITAL_BASED_OUTPATIENT_CLINIC_OR_DEPARTMENT_OTHER): Payer: Self-pay | Admitting: Oncology

## 2011-10-19 ENCOUNTER — Ambulatory Visit: Payer: Medicaid Other

## 2011-10-19 ENCOUNTER — Other Ambulatory Visit: Payer: Self-pay | Admitting: Oncology

## 2011-10-19 VITALS — BP 155/97 | HR 104 | Temp 100.1°F | Ht 72.0 in | Wt 182.1 lb

## 2011-10-19 DIAGNOSIS — D473 Essential (hemorrhagic) thrombocythemia: Secondary | ICD-10-CM

## 2011-10-19 DIAGNOSIS — D649 Anemia, unspecified: Secondary | ICD-10-CM

## 2011-10-19 DIAGNOSIS — C029 Malignant neoplasm of tongue, unspecified: Secondary | ICD-10-CM

## 2011-10-19 DIAGNOSIS — E46 Unspecified protein-calorie malnutrition: Secondary | ICD-10-CM

## 2011-10-19 LAB — COMPREHENSIVE METABOLIC PANEL
ALT: 29 U/L (ref 0–53)
BUN: 14 mg/dL (ref 6–23)
CO2: 27 mEq/L (ref 19–32)
Calcium: 9.5 mg/dL (ref 8.4–10.5)
Creatinine, Ser: 0.76 mg/dL (ref 0.50–1.35)
Total Bilirubin: 0.3 mg/dL (ref 0.3–1.2)

## 2011-10-19 LAB — CBC WITH DIFFERENTIAL/PLATELET
BASO%: 0.3 % (ref 0.0–2.0)
Basophils Absolute: 0 10*3/uL (ref 0.0–0.1)
EOS%: 0.3 % (ref 0.0–7.0)
HCT: 26.1 % — ABNORMAL LOW (ref 38.4–49.9)
HGB: 9 g/dL — ABNORMAL LOW (ref 13.0–17.1)
LYMPH%: 10.2 % — ABNORMAL LOW (ref 14.0–49.0)
MCH: 31.8 pg (ref 27.2–33.4)
MCHC: 34.5 g/dL (ref 32.0–36.0)
MONO#: 0.5 10*3/uL (ref 0.1–0.9)
NEUT%: 85 % — ABNORMAL HIGH (ref 39.0–75.0)
Platelets: 537 10*3/uL — ABNORMAL HIGH (ref 140–400)

## 2011-10-19 NOTE — Telephone Encounter (Signed)
appts made and printed for 2/13 and 2/25  aom

## 2011-10-19 NOTE — Progress Notes (Signed)
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  He developed recurrence in the left submandibular gland in August 2012.  He underwent on 07/27/2011 radical left neck dissection by Dr. Ezzard Standing and Dr. Geryl Rankins. Pathology case number [id number] showed total 15 lymph nodes from left radical neck dissection that was all negative. However the left submandibular gland mass showed a 3.5 cm invasive SCC, moderately differentiates involving the adjacent skeletal muscle tissue and bone a tissue and extending into the inked margin. There was no evidence of any lymphatic invasion or perineural invasion.  He underwent further resection by Dr. Mary Sella at [facility] on 09/29/2011.  I'm awaiting path from that resection.   CURRENT THERAPY:  Pending start of adjuvant chemoradiation.   HPI:  Thomas Bean returns to clinic today for evaluation prior to starting adjuvant therapy.  He has had significant post op edema in the left jaw.  The swelling now is much better than 3 weeks ago.  He has mucositis as well from recent dental extraction.  He was instructed not to have oral intake.  He has PEG tube and he uses about 6 cans of Ensure/Boost a day with at least 1L of free fluid.  He also had a trach stoma that is closing up.  It used to have drainage but this is improving. He has pain the the left mandibular flap and takes pain med for that.  He has mild fatigue; but is independent of all activities of daily living.  He still lives by himself.    Patient denies headache, visual changes, confusion, drenching night sweats, palpable lymph node swelling, nausea vomiting, jaundice, chest pain, palpitation, shortness of breath, dyspnea on exertion, productive  cough, gum bleeding, epistaxis, hematemesis, hemoptysis, abdominal pain, abdominal swelling, early satiety, melena, hematochezia, hematuria, skin rash, spontaneous bleeding, joint swelling, joint pain, heat or cold intolerance, bowel bladder incontinence, back pain, focal motor weakness, paresthesia, depression, suicidal or homocidal ideation, feeling hopelessness.   MEDICAL HISTORY: Past Medical History  Diagnosis Date  . Hemorrhoids   . PONV (postoperative nausea and vomiting)   . Hypertension     no medications at present  . GERD (gastroesophageal reflux disease)     no medication  . Depression     treated approx 12 years ago, no treatment at this time  . Hiatal hernia   . Cancer     left side of tongue T1 lesion excised 4/12    SURGICAL HISTORY:  Past Surgical History  Procedure Date  . Inguinal herniorrhapy   . Tonsillectomy and adenoidectomy   . Excision of tongue mass     April 2012  . Hernia repair   . Neck mass excision 07/27/2011  . Radical neck dissection 07/27/2011    Procedure: RADICAL NECK DISSECTION;  Surgeon: Drema Halon, MD;  Location: [facility] OR;  Service: ENT;  Laterality: Left;    MEDICATIONS: Current Outpatient Prescriptions  Medication Sig Dispense Refill  . HYDROcodone-acetaminophen (VICODIN) 5-500 MG per tablet Take 1 tablet by mouth every 6 (six) hours as needed. Pain       . chlorhexidine (PERIDEX) 0.12 % solution Use as directed 15 mLs in the mouth or throat 2 (two) times daily.        Marland Kitchen  ibuprofen (ADVIL,MOTRIN) 200 MG tablet Take 400 mg by mouth every 6 (six) hours as needed. Pain       . loratadine (CLARITIN) 10 MG tablet Take 10 mg by mouth daily as needed. FOR ALLERGIES       . LORazepam (ATIVAN) 0.5 MG tablet Take 1 tablet (0.5 mg total) by mouth every 6 (six) hours as needed (Nausea or vomiting).  30 tablet  0  . ondansetron (ZOFRAN) 8 MG tablet Take 1 tablet two times a day as needed for nausea or vomiting starting on the third day  after chemotherapy.  30 tablet  1  . prochlorperazine (COMPAZINE) 10 MG tablet Take 1 tablet (10 mg total) by mouth every 6 (six) hours as needed (Nausea or vomiting).  30 tablet  1    ALLERGIES:  is allergic to codeine phosphate.  REVIEW OF SYSTEMS:  The rest of the 14-point review of system was negative.   Filed Vitals:   10/19/11 1033  BP: 155/97  Pulse: 104  Temp: 100.1 F (37.8 C)   Wt Readings from Last 3 Encounters:  10/19/11 182 lb 1.6 oz (82.6 kg)  09/08/11 193 lb (87.544 kg)  08/26/11 193 lb 6.4 oz (87.726 kg)   ECOG Performance status: 1  PHYSICAL EXAMINATION:   General:  well-nourished in no acute distress.  Eyes:  no scleral icterus.  ENT:  There was filing defect in left mandible.  External flap in left mandible from left axilla. The flap was quite edematous without purulent discharge, erythema, or pain on palpation.  Trach stoma was closing up with no purulent discharge.  Lymphatics:  Negative cervical, supraclavicular or axillary adenopathy.  Respiratory: lungs were clear bilaterally without wheezing or crackles.  Cardiovascular:  Regular rate and rhythm, S1/S2, without murmur, rub or gallop.  There was no pedal edema.  GI:  abdomen was soft, flat, nontender, nondistended, without organomegaly.  Muscoloskeletal:  no spinal tenderness of palpation of vertebral spine.  Skin exam was without echymosis, petichae.  Left axilla site where the flap was harvested has staples in place with no erythema, purulent discharge or pain on palpation.   Neuro exam was nonfocal except for inability to raise up left arm from recent surgery (harvesting flap site).   Patient was able to get on and off exam table without assistance.  Gait was normal.  Patient was alerted and oriented.  Attention was good.   Language was appropriate.  Mood was normal without depression.  Speech was not pressured.  Thought content was not tangential.     LABORATORY/RADIOLOGY DATA:  Lab Results  Component Value  Date   WBC 11.3* 10/19/2011   HGB 9.0* 10/19/2011   HCT 26.1* 10/19/2011   PLT 537* 10/19/2011   GLUCOSE 103* 10/19/2011   CHOL 160 02/14/2008   TRIG 442* 02/14/2008   HDL 26.5* 02/14/2008   LDLDIRECT 64.8 02/14/2008   ALT 29 10/19/2011   AST 33 10/19/2011   NA 138 10/19/2011   K 4.3 10/19/2011   CL 101 10/19/2011   CREATININE 0.76 10/19/2011   BUN 14 10/19/2011   CO2 27 10/19/2011   TSH 2.38 02/14/2008     ASSESSMENT AND PLAN:   1. Smoking: none at this time.  2. Recurrent left lateral tongue squamous cell carcinoma:  I went over with patient indication of adjuvant chemoradiation therapy given extensive nature of his recurrence and high risk features of muscle invasion.  Adjuvant chemotherapy given concurrently with radiation has the potential benefit of decreasing  the risk of recurrence and improved overall survival.   I will contact [facility] for the final path.   He has previously decided and reiterated his desire for cisplatin.  I reiterated with him potential side effects of this chemo which include but not limited to nausea vomiting, alopecia, fatigue, mucositis, ototoxicity, nephrotoxicity, abnormal electrolytes, cytopenia, risk of bleeding and infection.    In preparation for chemoradiation, he needs a few weeks to recover from the latest resection from [facility] to allow his edema to settle down.  I referred him back to Dr. Dayton Scrape to plan simulation in the next 2-3 wks.  I tentatively will see him on 11/07/2011 to start chemotherapy.  If he is not ready to start radiation then due to edema, then, obviously that start date will be pushed back.  He had already seen Dental.  He has had chemo class.  He has Compazine/Zofran/Ativan nausea/vomiting.    3.  Calorie/Protein malnutrition:  I advised him to continue with PEG tube feeding about 6 cans Ensure/Boost a day.  He will be referred to Nutrition once therapy starts.  For now, he was instructed to refrain from oral intake by Dr. Manson Passey clinic due to recent flap  construction.    4.  Mucositis prevention:  Salt/baking soda mouth rinses and Biotene.  5.  Left arm limited range of motion due to recent graft harvest:  Once he is cleared by [facility] ENT, I will referred him to PT/OT to learn exercise to decrease risk of stricture.  6.  Anemia:  Most likely due to inflammation from recent operation vs iron deficiency.  I sent for iron panel today.   7.  Thrombocytosis:  Either inflammation or iron deficiency.  No concern for ET.  No further work up is indicated at this time.    The length of time of the face-to-face encounter was 25 minutes. More than 50% of time was spent counseling and coordination of care.

## 2011-10-20 ENCOUNTER — Ambulatory Visit: Payer: Medicaid Other

## 2011-10-20 ENCOUNTER — Other Ambulatory Visit: Payer: Self-pay | Admitting: Oncology

## 2011-10-20 DIAGNOSIS — D649 Anemia, unspecified: Secondary | ICD-10-CM

## 2011-10-21 ENCOUNTER — Ambulatory Visit: Payer: Medicaid Other

## 2011-10-21 LAB — IRON AND TIBC
%SAT: 11 % — ABNORMAL LOW (ref 20–55)
Iron: 33 ug/dL — ABNORMAL LOW (ref 42–165)
TIBC: 307 ug/dL (ref 215–435)

## 2011-10-21 LAB — FERRITIN: Ferritin: 258 ng/mL (ref 22–322)

## 2011-10-24 ENCOUNTER — Encounter: Payer: Self-pay | Admitting: Oncology

## 2011-10-24 ENCOUNTER — Ambulatory Visit: Payer: Medicaid Other

## 2011-10-24 NOTE — Progress Notes (Signed)
Patient approved for 100% EPP discount, for family of 1, income [phone]

## 2011-10-25 ENCOUNTER — Encounter: Payer: Self-pay | Admitting: Radiation Oncology

## 2011-10-25 ENCOUNTER — Ambulatory Visit: Payer: Medicaid Other

## 2011-10-25 NOTE — Progress Notes (Signed)
51 year old male. Divorced with 52 year old son. Unemployed.    Patient seen by Dr. Gaylyn Rong on 10/19/2011 in preparation for chemoradiation. Dr. Gaylyn Rong allowed a few weeks to recover from mandibulectomy and scapular free flap surgery at [facility] so his edema could settle down. Dr. Gaylyn Rong tentatively scheduled him to start chemotherapy on 11/07/2011. Dr. Gaylyn Rong reports that the patient has already been seen by dental. Patient using PEG tube solely because he was instructed to refrain from oral intake by Dr. Manson Passey due to recent flap construction.  AX: Codeine phophate causes rash and swelling No indication of a pacemaker No hx of radiation therapy

## 2011-10-26 ENCOUNTER — Ambulatory Visit
Admission: RE | Admit: 2011-10-26 | Discharge: 2011-10-26 | Disposition: A | Discharge status: Home / Self Care | Payer: Medicaid Other | Source: Ambulatory Visit | Attending: Radiation Oncology | Admitting: Radiation Oncology

## 2011-10-26 ENCOUNTER — Encounter: Payer: Self-pay | Admitting: Radiation Oncology

## 2011-10-26 ENCOUNTER — Ambulatory Visit: Payer: Medicaid Other

## 2011-10-26 VITALS — BP 155/79 | HR 88 | Temp 98.7°F | Resp 18 | Ht 72.0 in | Wt 176.8 lb

## 2011-10-26 DIAGNOSIS — C770 Secondary and unspecified malignant neoplasm of lymph nodes of head, face and neck: Secondary | ICD-10-CM

## 2011-10-26 DIAGNOSIS — C029 Malignant neoplasm of tongue, unspecified: Secondary | ICD-10-CM

## 2011-10-26 NOTE — Progress Notes (Signed)
Followup note:  Diagnosis: Recurrent squamous cell carcinoma of the left lateral oral tongue, left submandibular region, initial stage I (T1 N0)  [name] is seen today for review and scheduling of his postoperative radiation therapy in the management of his recurrent squamous cell carcinoma of the left lateral oral tongue. At the time of his treatment planning on 09/08/2011 he was found to have a 1.5 cm firm nodule 6 to the anterolateral inferior left mandible. A biopsy revealed recurrent squamous cell carcinoma. He was seen by Dr. Erroll Luna at [facility] and apparently underwent a left mandibulectomy and scapular free flap reconstruction. One week postop percent have a fluid collection under his flap and this was drained. He was discharged from the hospital after a 10 day stay. I do not have his restaging studies which included a CT scan of his mandible, or his pathology report at the time of this dictation. He tells me that Dr. Erroll Luna told that he had a 3.0 cm tumor when you saw him for a followup visit earlier this week. He is without complaints today. He is getting all of his nutrition through his PEG tube. He tells that his weight is now stable. I do note that he has lost 6 pounds over the past week and has lost almost 20 pounds over the past 2 months.  Physical examination: Neck: There is a left scapular free flap reconstruction along the left upper neck. The wound appears to be healing well. There is no fluid expressed on palpation. There is no palpable lymphadenopathy within the neck. Oral cavity surgical changes noted along the left oral cavity with slight tenting of the left tongue. His sutures are quite visible there is no visible or palpable evidence for recurrent disease. Oropharynx unremarkable to inspection.  Impression: Recurrent squamous cell carcinoma of the left oral tongue with recurrent disease invading bone. I am awaiting his operative reports and  pathology report. That he would benefit from postoperative chemoradiation as previously discussed. I spoke with Dr. Robin Searing of dental oncology for possible modification of his scatter protection devices with the mandibular device perhaps needing modification secondary to his recent surgery. I again discussed the potential acute and late toxicities of postoperative radiation therapy. Dr. Gaylyn Rong has a tentative start date of February 25 for his chemotherapy. Consent was previously signed. I will go ahead and get him scheduled for treatment planning early next week, and he'll begin his radiation therapy the week of February 25. Again, Dr. Robin Searing will see him in the near future.  30 minutes was spent face-to-face with the patient, primarily counseling the patient and coordinating his care.

## 2011-10-26 NOTE — Progress Notes (Signed)
Patient presents to the clinic today unaccompanied for a follow up new consult with Dr. Dayton Scrape. Patient is alert and oriented to person, place, and time. No distress noted. Steady gait noted. Pleasant affect noted. Patient reports constant aching left jaw and left arm pain 3 on a scale of 0-10 for which hydrocodone acetaminophen suspension relieves. Patient reports that on Monday he saw his surgeon in [place]. His surgeon has clear him for chemotherapy and radiation. Also, his surgeon is allowing him to take liquids by mouth. Patient resumed using his sodium fluoride trays.  Patient denies nausea, vomiting, headaches, double vision or diarrhea. Patient reports difficulty with constipation related to pain medication for which Miralax relieves. Reported all findings to Dr. Dayton Scrape.

## 2011-10-26 NOTE — Progress Notes (Signed)
Encounter addended by: Ardell Isaacs, RN on: 10/26/2011  4:55 PM<BR>     Documentation filed: Notes Section

## 2011-10-26 NOTE — Progress Notes (Signed)
Please see the Nurse Progress Note in the MD Initial Consult Encounter for this patient. 

## 2011-10-26 NOTE — Progress Notes (Signed)
Complete NUTRITION RISK SCREEN worksheet without concern submitted to Zenovia Jarred, RD. Complete PATIENT MEASURE OF DISTRESS worksheet with a score of 8 submitted to social work. Patient denies need to see social work today.

## 2011-10-27 ENCOUNTER — Encounter (HOSPITAL_COMMUNITY): Payer: Self-pay | Admitting: Dentistry

## 2011-10-27 ENCOUNTER — Ambulatory Visit (HOSPITAL_COMMUNITY): Payer: Self-pay | Admitting: Dentistry

## 2011-10-27 ENCOUNTER — Ambulatory Visit: Payer: Medicaid Other

## 2011-10-27 VITALS — BP 118/89 | HR 87 | Temp 98.4°F

## 2011-10-27 DIAGNOSIS — Z0189 Encounter for other specified special examinations: Secondary | ICD-10-CM

## 2011-10-27 DIAGNOSIS — Z463 Encounter for fitting and adjustment of dental prosthetic device: Secondary | ICD-10-CM

## 2011-10-27 DIAGNOSIS — K08109 Complete loss of teeth, unspecified cause, unspecified class: Secondary | ICD-10-CM

## 2011-10-27 NOTE — Patient Instructions (Signed)
TRISMUS  Trismus is a condition where the jaw does not allow the mouth to open as wide as it usually does.  This can happen almost suddenly, or in other cases the process is so slow, it is hard to notice it-until it is too far along.  When the jaw joints and/or muscles have been exposed to radiation treatments, the onset of Trismus is very slow.  This is because the muscles are losing their stretching ability over a long period of time, as long as 2 YEARS after the end of radiation.  It is therefore important to exercise these muscles and joints.  TRISMUS EXERCISES   Stack of tongue depressors measuring the same or a little less than the last documented MIO (Maximum Interincisal Opening).  Secure them with a rubber band on both ends.  Place the stack in the patient's mouth, supporting the other end.  Allow 30 seconds for muscle stretching.  Rest for a few seconds.  Repeat 3-5 times  For all radiation patients, this exercise is recommended in the mornings and evenings unless otherwise instructed.  The exercise should be done for a period of 2 YEARS after the end of radiation.  MIO should be checked routinely on recall dental visits by the general dentist or the hospital dentist.  The patient is advised to report any changes, soreness, or difficulties encountered when doing the exercises. 

## 2011-10-27 NOTE — Progress Notes (Signed)
PERIODIC ORAL EXAMINATION   DATE:   10/27/2011 PATIENT:  Thomas Bean MRN:   [id number]   HAROUN COTHAM presents for periodic oral examination. Patient recently underwent a left mandibulectomy with scapular and flap reconstruction with Dr. Georgie Chard in January 2013. Patient now with anticipated chemoradiation therapy. Patient recently evaluated by Dr. Chipper Herb and evaluation of previous tongue positioner/radiation cone locator was discussed. Patient now presents for evaluation of the previously fabricated radiation cone locator with possible adaptation due to the more recent surgery.   Examination: Patient with the left neck and mandible area consistent with previous surgery and reconstructive efforts. Intraoral defect consistent with surgery noted as well. Orthopantogram x-ray was obtained.  The mandibulectomy was noted along with scapular reconstruction with plating on both the mesial and distal aspects to the existing mandible and coronoid process areas. Patient had decreased maximum interincisal opening. Patient also with a deviation of his arc of closure. Previous tongue positioner/radiation cone locator was attempted to be placed in the mouth. This was not able to be achieved due to the lack of maximum interincisal opening. A radiation cone locator was then adjusted in the bite block area to allow for re-fabrication of a new occlusion. Additional acrylic material was added to the occlusal plane and a new bite registration was obtained. The radiation cone locator was then adjusted and polished appropriately. The patient then was provided instructions on the insertion and removal of this device. The patient was able to achieve this without complications. Patient denies any problems with having the device in place. Patient will now proceed with new simulation with Dr. Dayton Scrape this coming Monday, 10/31/2011. Patient then had a new trismus device fabricated at this time  with a maximum interincisal opening of 30 mm. Patient was provided written and verbal instruction on the use and care of the trismus device. Oral Hygiene instructions were then provided and the daily use of the fluoride trays and neutral fluoride therapy was discussed. All questions were answered. Patient was then dismissed in stable condition. Patient is to call to schedule a periodic oral examination in approximately 4-5 weeks from now when the patient is undergoing active radiation therapy. Patient is to contact dental medicine if acute problems arise before then.   Dr. Windy Fast. [employer]

## 2011-10-28 ENCOUNTER — Ambulatory Visit: Payer: Medicaid Other

## 2011-10-28 NOTE — Progress Notes (Signed)
Encounter addended by: Ardell Isaacs, RN on: 10/28/2011  4:39 PM<BR>     Documentation filed: Charges VN, Inpatient Patient Education

## 2011-10-31 ENCOUNTER — Ambulatory Visit: Admission: RE | Admit: 2011-10-31 | Payer: Medicaid Other | Source: Ambulatory Visit | Admitting: Radiation Oncology

## 2011-10-31 ENCOUNTER — Ambulatory Visit: Payer: Medicaid Other

## 2011-10-31 ENCOUNTER — Encounter: Payer: Self-pay | Admitting: Radiation Oncology

## 2011-10-31 NOTE — Progress Notes (Signed)
Chart note:  I reviewed [name] operative reports from January 17 and January 25 along with his pathology report. At the time of his initial surgery on 09/29/2011 the tumor was marked out with one cm margins. The overlying skin was also excised. A 6 cm oncology to the tumor was excised. He underwent reconstruction with a scapular free flap. On review his pathology carcinoma invaded the mandibular bone and extended into skeletal muscle and subcutaneous tissues in the region of the left neck. Perineural invasion was present. The medial and inferior soft tissue margins were positive for squamous cell carcinoma. His tumor was moderate to poorly differentiated.

## 2011-10-31 NOTE — Progress Notes (Signed)
Received Home Health Discharge Report from [facility]; forwarded to Dr. Ha. 

## 2011-11-03 ENCOUNTER — Ambulatory Visit: Payer: Self-pay

## 2011-11-04 ENCOUNTER — Telehealth: Payer: Self-pay | Admitting: Radiation Oncology

## 2011-11-04 NOTE — Telephone Encounter (Signed)
Patient phoned confused about chemotherapy appointment since radiation therapy had been delayed for one week by Dr. Dayton Scrape due to swelling and drainage. Dr. Dayton Scrape confirms that he has contact Dr. Gaylyn Rong and this patient's chemo should be delayed by one week. Phone Dr. Lodema Pilot nurse, Dorene Grebe, to review this. Dorene Grebe, RN verbalized she would clarify this with Dr. Gaylyn Rong and phone the patient with his new schedule. Also, contacted Zenovia Jarred who is scheduled to see the patient 11/07/2011 informing her his chemotherapy and radiation had been delayed. Patient confirms that he is not having any nutrition or feeding tube issues and feels comfortable waiting until the first week of March for his consult with Britta Mccreedy. Reminded patient of 11/08/2011  1000 CT/SIM and informed him Dorene Grebe would be in contact. Encouraged to call with future needs. Patient verbalized understanding of all things reviewed.

## 2011-11-07 ENCOUNTER — Ambulatory Visit: Payer: Self-pay

## 2011-11-07 ENCOUNTER — Encounter: Payer: Self-pay | Admitting: Nutrition

## 2011-11-07 ENCOUNTER — Ambulatory Visit (HOSPITAL_BASED_OUTPATIENT_CLINIC_OR_DEPARTMENT_OTHER): Payer: Self-pay | Admitting: Oncology

## 2011-11-07 ENCOUNTER — Other Ambulatory Visit: Payer: Self-pay | Admitting: Lab

## 2011-11-07 DIAGNOSIS — C801 Malignant (primary) neoplasm, unspecified: Secondary | ICD-10-CM

## 2011-11-07 DIAGNOSIS — C770 Secondary and unspecified malignant neoplasm of lymph nodes of head, face and neck: Secondary | ICD-10-CM

## 2011-11-08 ENCOUNTER — Ambulatory Visit
Admission: RE | Admit: 2011-11-08 | Discharge: 2011-11-08 | Disposition: A | Discharge status: Home / Self Care | Payer: Medicaid Other | Source: Ambulatory Visit | Attending: Radiation Oncology | Admitting: Radiation Oncology

## 2011-11-08 DIAGNOSIS — C029 Malignant neoplasm of tongue, unspecified: Secondary | ICD-10-CM

## 2011-11-08 NOTE — Progress Notes (Signed)
Simulation/treatment planning note  The patient was taken to the CT simulator. A custom head cast was constructed on S frame for immobilization. Prior to this his bite block was placed and a marked his surgical scars with a radiopaque wire. He was then scanned. His scan was fused with his previous CT scan to localize his tumor bed, particular since he had a positive margin. I contoured his high risk tumor bed CTV (CTV 6600), medium high risk CTV (CTV 5700), medium risk tumor bed CTV (CTV 5610) and low risk tumor bed CTV (CTV 5445). Each of these volumes were expanded by 0.4 cm to create respect his PTV's. His right parotid gland, larynx, spinal cord in addition to other avoidance structures will be contoured. He is now ready for IM RT simulation/treatment planning for Tomotherapy/IMRT. He will receive concomitant chemotherapy in this constitutes a special treatment procedure. We can expect increased toxicity along the oral cavity and oropharynx. Nutritional consultation is requested.

## 2011-11-08 NOTE — Progress Notes (Signed)
Visit rescheduled 

## 2011-11-09 ENCOUNTER — Telehealth: Payer: Self-pay | Admitting: Oncology

## 2011-11-09 NOTE — Telephone Encounter (Signed)
called pt and informd her of appts on 03/04-03/05

## 2011-11-13 ENCOUNTER — Encounter: Payer: Self-pay | Admitting: Radiation Oncology

## 2011-11-13 NOTE — Progress Notes (Signed)
IMRT simulation/treatment planning note:  The patient underwent completion of IMRT simulation/treatment planning on 11/11/2011. IMRT was chosen to decrease the risk for chronic xerostomia and osteoradionecrosis of bone compared to 3-D or conventional radiation therapy. Dose volume histograms were reviewed in detail. We made an effort to minimize the dose of radiation to his left mandibular/scapular graft. We met our target goals with respect to his high-risk tumor bed PTV, medium high risk tumor bed PTV, medium risk tumor bed PTV and low breast tumor bed PTV. We also met our target goals with respect to his avoidance structures including his right parotid gland, brain stem, spinal cord and larynx. He'll undergo daily MV CT, setting up to his spine and mandible on a daily basis. He is also to receive concomitant chemotherapy in this constitutes a special treatment procedure. I request nutritional consultation for his PEG, and we'll need to closely follow his blood counts.

## 2011-11-14 ENCOUNTER — Ambulatory Visit: Payer: Self-pay | Admitting: Oncology

## 2011-11-14 ENCOUNTER — Encounter: Payer: Self-pay | Admitting: *Deleted

## 2011-11-14 ENCOUNTER — Other Ambulatory Visit: Payer: Self-pay | Admitting: Lab

## 2011-11-14 ENCOUNTER — Ambulatory Visit (HOSPITAL_BASED_OUTPATIENT_CLINIC_OR_DEPARTMENT_OTHER): Payer: Self-pay | Admitting: Oncology

## 2011-11-14 ENCOUNTER — Telehealth: Payer: Self-pay | Admitting: Oncology

## 2011-11-14 VITALS — BP 134/84 | HR 93 | Temp 97.9°F | Ht 72.0 in | Wt 181.6 lb

## 2011-11-14 DIAGNOSIS — C770 Secondary and unspecified malignant neoplasm of lymph nodes of head, face and neck: Secondary | ICD-10-CM

## 2011-11-14 DIAGNOSIS — C029 Malignant neoplasm of tongue, unspecified: Secondary | ICD-10-CM

## 2011-11-14 DIAGNOSIS — D649 Anemia, unspecified: Secondary | ICD-10-CM

## 2011-11-14 LAB — COMPREHENSIVE METABOLIC PANEL
ALT: 17 U/L (ref 0–53)
AST: 20 U/L (ref 0–37)
Albumin: 3.6 g/dL (ref 3.5–5.2)
Alkaline Phosphatase: 69 U/L (ref 39–117)
BUN: 9 mg/dL (ref 6–23)
Calcium: 9.9 mg/dL (ref 8.4–10.5)
Chloride: 102 mEq/L (ref 96–112)
Potassium: 3.7 mEq/L (ref 3.5–5.3)
Sodium: 139 mEq/L (ref 135–145)
Total Protein: 7.6 g/dL (ref 6.0–8.3)

## 2011-11-14 LAB — CBC WITH DIFFERENTIAL/PLATELET
BASO%: 0.5 % (ref 0.0–2.0)
Basophils Absolute: 0 10*3/uL (ref 0.0–0.1)
EOS%: 1.8 % (ref 0.0–7.0)
HGB: 10.6 g/dL — ABNORMAL LOW (ref 13.0–17.1)
MCH: 30.5 pg (ref 27.2–33.4)
MCHC: 33.2 g/dL (ref 32.0–36.0)
MCV: 91.8 fL (ref 79.3–98.0)
MONO%: 6.5 % (ref 0.0–14.0)
RBC: 3.47 10*6/uL — ABNORMAL LOW (ref 4.20–5.82)
RDW: 13.7 % (ref 11.0–14.6)
lymph#: 1.4 10*3/uL (ref 0.9–3.3)

## 2011-11-14 NOTE — Progress Notes (Signed)
Called referral to Angie at [facility] for ST.  Pt starts Tx tomorrow,  Asked for appt ASAP.  Faxed referral to fax#[phone].

## 2011-11-14 NOTE — Telephone Encounter (Signed)
Gv pt appt for march-april2013 

## 2011-11-14 NOTE — Progress Notes (Signed)
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 He developed recurrence in the left submandibular gland in August 2012. He underwent on 07/27/2011 radical left neck dissection by Dr. Ezzard Standing and Dr. Geryl Rankins. Pathology case number [id number] showed total 15 lymph nodes from left radical neck dissection that was all negative. However the left submandibular gland mass showed a 3.5 cm invasive SCC, moderately differentiates involving the adjacent skeletal muscle tissue and bone a tissue and extending into the inked margin. There was no evidence of any lymphatic invasion or perineural invasion. He underwent further resection by Dr. Mary Sella at [facility] on 09/29/2011. I'm awaiting path from that resection.   CURRENT THERAPY: Pending start of adjuvant chemoradiation with Cisplatin every 3 weeks, beginning Cycle 1  on 11/15/2011 with daily radiation.   INTERIM HISTORY: Thomas Bean returns to clinic today for evaluation prior to starting adjuvant therapy. He has had significant post op edema in the left jaw. The swelling now is much better than 3 weeks ago.He is able to swallow liquids without much difficulty. He is taking an average of 6 Ensure/milkshakes a day between tube and oral feedings with at least 1.5L of free fluid. He also had a trach stoma that is almost closed.No further drainage in the stomal area. The only area of minimal sero-sanguinoeus drainage is at the L lateral neck incision without any purulence. He has pain the the left mandibular flap and takes pain med for that, one tab in the morning and one at night. He has mild fatigue, but is independent of all activities of daily living. He still lives by himself. He has been seen by Dr.  Kristin Bruins (dentistry) who has built a block around the bone region to prevent any complications during radiation to the jaw area.He has been using salt/waster mouthrinse on a frequent basis.  Patient denies headache, visual changes, confusion, drenching night sweats, palpable lymph node swelling, nausea vomiting, jaundice, chest pain, palpitation, shortness of breath, dyspnea on exertion, productive cough, gum bleeding, epistaxis, hematemesis, hemoptysis, abdominal pain, abdominal swelling, early satiety, melena, hematochezia, hematuria, skin rash, spontaneous bleeding, joint swelling, joint pain, heat or cold intolerance, bowel bladder incontinence, back pain, focal motor weakness, paresthesia, depression, suicidal or homocidal ideation, feeling hopelessness.       MEDICAL HISTORY: Past Medical History  Diagnosis Date  . Hemorrhoids   . PONV (postoperative nausea and vomiting)   . Hypertension     no medications at present  . GERD (gastroesophageal reflux disease)     no medication  . Depression     treated approx 12 years ago, no treatment at this time  . Hiatal hernia   . Cancer     left side of tongue T1 lesion excised 4/12    SURGICAL HISTORY:  Past Surgical History  Procedure Date  . Inguinal herniorrhapy   . Tonsillectomy and adenoidectomy   . Excision of tongue mass     April 2012  . Hernia repair   . Neck mass excision 07/27/2011  . Radical neck dissection 07/27/2011    Procedure: RADICAL NECK DISSECTION;  Surgeon: Drema Halon, MD;  Location: [facility] OR;  Service: ENT;  Laterality: Left;  Marland Kitchen Mandibulectomy 09/2011    and scapular free flap  MEDICATIONS: Current Outpatient Prescriptions  Medication Sig Dispense Refill  . chlorhexidine (PERIDEX) 0.12 % solution Use as directed 15 mLs in the mouth or throat 2 (two) times daily.        Marland Kitchen HYDROcodone-acetaminophen (LORTAB) 7.5-500 MG per tablet Take 1 tablet by mouth every 6 (six) hours as needed. Liquid form      .  HYDROcodone-acetaminophen (VICODIN) 5-500 MG per tablet Take 1 tablet by mouth every 6 (six) hours as needed. Pain       . ibuprofen (ADVIL,MOTRIN) 200 MG tablet Take 400 mg by mouth every 6 (six) hours as needed. Pain       . loratadine (CLARITIN) 10 MG tablet Take 10 mg by mouth daily as needed. FOR ALLERGIES       . LORazepam (ATIVAN) 0.5 MG tablet Take 1 tablet (0.5 mg total) by mouth every 6 (six) hours as needed (Nausea or vomiting).  30 tablet  0  . ondansetron (ZOFRAN) 8 MG tablet Take 1 tablet two times a day as needed for nausea or vomiting starting on the third day after chemotherapy.  30 tablet  1  . prochlorperazine (COMPAZINE) 10 MG tablet Take 1 tablet (10 mg total) by mouth every 6 (six) hours as needed (Nausea or vomiting).  30 tablet  1    ALLERGIES:  is allergic to codeine phosphate and tape.  REVIEW OF SYSTEMS:  The rest of the 14-point review of system was negative.   Filed Vitals:   11/14/11 0834  BP: 134/84  Pulse: 93  Temp: 97.9 F (36.6 C)   Wt Readings from Last 3 Encounters:  11/14/11 181 lb 9.6 oz (82.373 kg)  10/26/11 176 lb 12.8 oz (80.196 kg)  10/19/11 182 lb 1.6 oz (82.6 kg)     PHYSICAL EXAMINATION:   General: well-nourished in no acute distress. Eyes: no scleral icterus. ENT: There was filing defect in left mandible. External flap in left mandible from left axilla.Trach stoma was closed with no purulent discharge. At the L neck mandibulo-cervical flap, there is an area of minimal drainage with no purulence.No tenderness or significant edema seen. Oral cavity without erythema. There is the presence of 2-3 stitches in the L oral mucosa which are self absorbing.   Lymphatics: Negative cervical, supraclavicular or axillary adenopathy. Respiratory: lungs were clear bilaterally without wheezing or crackles. Cardiovascular: Regular rate and rhythm, S1/S2, without murmur, rub or gallop. There was no pedal edema. GI: abdomen was soft, flat, nontender,  nondistended, without organomegaly.His tube area is non tender and no drainage is seen.  Muscoloskeletal: no spinal tenderness of palpation of vertebral spine. Skin exam was without echymosis, petichae. Left axilla site where the flap was harvested has staples in place with no erythema, purulent discharge or pain on palpation. Neuro exam was nonfocal except for inability to raise up left arm from recent surgery (harvesting flap site). Patient was able to get on and off exam table without assistance. Gait was normal. Patient was alerted and oriented. Attention was good. Language was appropriate. Mood was normal without depression. Speech was not pressured. Thought content was not tangential.       LABORATORY/RADIOLOGY DATA:   Lab 11/14/11 0820  WBC 6.0  HGB 10.6*  HCT 31.8*  PLT 236  MCV 91.8  MCH 30.5  MCHC 33.2  RDW 13.7  LYMPHSABS 1.4  MONOABS 0.4  EOSABS 0.1  BASOSABS 0.0  BANDABS --    CMP    Lab 11/14/11 0820  NA 139  K  3.7  CL 102  CO2 30  GLUCOSE 121*  BUN 9  CREATININE 0.70  CALCIUM 9.9  MG --  AST 20  ALT 17  ALKPHOS 69  BILITOT 0.2*        Component Value Date/Time   BILITOT 0.2* 11/14/2011 0820   BILIDIR 0.1 02/14/2008 0803     Radiology Studies:  No results found.     ASSESSMENT AND PLAN:   1. Smoking: none at this time.   2. Recurrent left lateral tongue squamous cell carcinoma:  He is to begin receiving weekly cisplatin on 11/15/2011. Dr Gaylyn Rong reiterated with him potential side effects of this chemo which include but not limited to nausea vomiting, alopecia, fatigue, mucositis, ototoxicity, nephrotoxicity, abnormal electrolytes, cytopenia, risk of bleeding and infection. He has Compazine/Zofran/Ativan nausea/vomiting.  Dr. Gaylyn Rong has provided the patient with valuable patient information regarding the chemo treatment.   3. Calorie/Protein malnutrition: I advised him to continue with PEG tube feeding about 6 cans Ensure/Boost a day. He will be  referred to Nutrition once therapy starts.He is to increase the fluid intake prior to his chemotherapy and on the day of chem to remain hydrated. He is to consume at least 2 liters today and 2 liters on 3/5 to maintain a good hydration level..    4. Mucositis prevention: Salt/baking soda mouth rinses and Biotene. Dr. Gaylyn Rong has provided patient with instructions.  5. Left arm limited range of motion due to recent graft harvest: Once he is cleared by [facility], I will referred him to PT/OT to learn exercise to decrease risk of stricture.   6. Anemia: Most likely due to inflammation from recent operation vs iron deficiency. I sent for iron panel today.   7.Patient is to be scheduled for speech therapy for swallow excercises.  8. He has been instructed to take stool softeners while needing pain medications, and possibly laxative use if constipation occurs. Patient agreed.  Thomas Bean is to return for labs on 3/11 and for a follow up visit with Dr. Abby Potash 11/22/2011. He knows in the interim if he has any questions or concerns.  The length of time of the face-to-face encounter was 25 minutes. More than 50% of time was spent counseling and coordination of care.

## 2011-11-15 ENCOUNTER — Ambulatory Visit: Payer: Self-pay | Admitting: Nutrition

## 2011-11-15 ENCOUNTER — Encounter: Payer: Self-pay | Admitting: Radiation Oncology

## 2011-11-15 ENCOUNTER — Ambulatory Visit
Admission: RE | Admit: 2011-11-15 | Discharge: 2011-11-15 | Disposition: A | Discharge status: Home / Self Care | Payer: Medicaid Other | Source: Ambulatory Visit | Attending: Radiation Oncology | Admitting: Radiation Oncology

## 2011-11-15 ENCOUNTER — Ambulatory Visit (HOSPITAL_BASED_OUTPATIENT_CLINIC_OR_DEPARTMENT_OTHER): Payer: Self-pay

## 2011-11-15 VITALS — Wt 180.1 lb

## 2011-11-15 VITALS — BP 143/93 | HR 81 | Temp 98.1°F

## 2011-11-15 DIAGNOSIS — C029 Malignant neoplasm of tongue, unspecified: Secondary | ICD-10-CM

## 2011-11-15 DIAGNOSIS — Z5111 Encounter for antineoplastic chemotherapy: Secondary | ICD-10-CM

## 2011-11-15 MED ORDER — PALONOSETRON HCL INJECTION 0.25 MG/5ML
0.2500 mg | Freq: Once | INTRAVENOUS | Status: AC
  Administered 2011-11-15: 0.25 mg via INTRAVENOUS

## 2011-11-15 MED ORDER — SODIUM CHLORIDE 0.9 % IV SOLN
150.0000 mg | Freq: Once | INTRAVENOUS | Status: AC
  Administered 2011-11-15: 150 mg via INTRAVENOUS
  Filled 2011-11-15: qty 5

## 2011-11-15 MED ORDER — POTASSIUM CHLORIDE 2 MEQ/ML IV SOLN
Freq: Once | INTRAVENOUS | Status: AC
  Administered 2011-11-15: 10:00:00 via INTRAVENOUS
  Filled 2011-11-15: qty 10

## 2011-11-15 MED ORDER — CISPLATIN CHEMO INJECTION 100MG/100ML
100.0000 mg/m2 | Freq: Once | INTRAVENOUS | Status: AC
  Administered 2011-11-15: 211 mg via INTRAVENOUS
  Filled 2011-11-15: qty 211

## 2011-11-15 MED ORDER — DEXAMETHASONE SODIUM PHOSPHATE 4 MG/ML IJ SOLN
12.0000 mg | Freq: Once | INTRAMUSCULAR | Status: AC
  Administered 2011-11-15: 12 mg via INTRAVENOUS

## 2011-11-15 NOTE — Progress Notes (Signed)
[name] is receiving his 1st cisplatin treatment today as well as his 1st radiation treatment.  His treatment was delayed secondary to the need for a 2nd surgery.  The patient reports that he has been using his feeding tube.  He uses 6 cans of Ensure Plus daily either by mouth or in his feeding tube.  He is flushing with about 1500 mL of free water daily.  This is broken out between his feedings.  He has just recently begun to consume some liquids and very soft moist solids at the advice of his physician.  His weight has increased to 181.6 pounds documented March 4th.  He is not having any issues with tube feeding tolerance.  NUTRITION DIAGNOSIS:  Predicted suboptimal energy intake has evolved into inadequate oral intake related to diagnosis of tongue cancer and associated treatments as evidenced by patient's need to begin enteral nutrition support.  INTERVENTION:  I have educated the patient that 6 cans of Ensure Plus provides approximately 2100 calories which is about 88% of his minimum estimated caloric needs.  It only provides 78 g protein which is only 60% of his minimum needs, so I have educated him on soft moist foods and liquids that would have more protein in them.  I have recommended the patient try a protein powder or protein supplement based on tolerance. I have educated him on products he could try and he will work towards adding additional 40 g protein to his daily total.  Tube feeding plus protein supplement will provide about 2100 calories, 118 g protein, and a total of 2580 mL of free water daily.  He is to continue to eat and drink as he is permitted.  We have discussed the importance of taking nausea medication as well as monitoring his bowel movements so that he does not become constipated.  The patient verbalizes understanding.  MONITORING/EVALUATION/GOALS:  The patient was unable to tolerate oral intake secondary to surgery.  However, he is tolerating tube feedings and has minimized  weight loss.  He has lost 9% of his usual body weight.  NEXT VISIT:  Friday, March 15th.  Patient will call me if he has any issues with tube feed intolerance prior to this.    ______________________________ Zenovia Jarred, RD, LDN Clinical Nutrition Specialist BN/MEDQ  D:  11/15/2011  T:  11/15/2011  Job:  [id number]

## 2011-11-15 NOTE — Progress Notes (Signed)
Chart note:  The patient underwent Tomotherapy segmentation today for treatment of his carcinoma of the tongue. He is being treated to 6.6 delivered field widths corresponding to one set of IMRT treatment devices [phone])

## 2011-11-16 ENCOUNTER — Telehealth: Payer: Self-pay | Admitting: *Deleted

## 2011-11-16 ENCOUNTER — Ambulatory Visit
Admission: RE | Admit: 2011-11-16 | Discharge: 2011-11-16 | Disposition: A | Discharge status: Home / Self Care | Payer: Medicaid Other | Source: Ambulatory Visit | Attending: Radiation Oncology | Admitting: Radiation Oncology

## 2011-11-16 DIAGNOSIS — C029 Malignant neoplasm of tongue, unspecified: Secondary | ICD-10-CM | POA: Insufficient documentation

## 2011-11-16 DIAGNOSIS — C801 Malignant (primary) neoplasm, unspecified: Secondary | ICD-10-CM | POA: Insufficient documentation

## 2011-11-16 DIAGNOSIS — C770 Secondary and unspecified malignant neoplasm of lymph nodes of head, face and neck: Secondary | ICD-10-CM | POA: Insufficient documentation

## 2011-11-16 NOTE — Telephone Encounter (Signed)
Message copied by Augusto Garbe on Wed Nov 16, 2011 11:00 AM ------      Message from: Sherre Poot      Created: Tue Nov 15, 2011  3:11 PM      Regarding: Chemotherapy Follow-up Call      Contact: [phone]       First Cisplatin- Dr. Jethro Bolus

## 2011-11-16 NOTE — Telephone Encounter (Signed)
Patient returned chemo f/u call.  Reports he is doing fairly well.  Did feel queasy at 4:00am and took a compazine.  Took lorazepam at bedtime, fell asleep but at 11:00pm he was awake every hour.  Reports from the neck up he is red.  Asked if something he received yesterday causes sneezing.  No sneezing fits but has sneezed three to four times and is not one to sneeze.  Reports he takes advil for headaches and mylanta for hiatal hernia and asked if he may take these meds.  Reports he stopped his hydrocodone last week and yesterday had to go quickly to the bathroom for bowel movements.  Miralax was used last Thursday.  Abdomen felt tight yesterday during treatment but reports he put 34 oz water in his feeding tube before coming in. Asked tat he instill 60 - 100 ml of water after feedings to stretch this out over the course of the day.  Questions answered.  Practice good handwashing and avoid people who are sick.

## 2011-11-16 NOTE — Telephone Encounter (Signed)
Called work number and answering service available to leave message but called patient's home instead.  Message left requesting a return call for CDDP treatment.  Awaiting return call.

## 2011-11-16 NOTE — Progress Notes (Signed)
Received patient and his father in the clinic today following initial treatment for post sim education with Sam, Charity fundraiser. Patient is alert and oriented to person, place, and time. No distress noted. Steady gait noted. Pleasant affect noted. Patient denies pain at this time. Oriented patient and his father to staff and routine of the clinic. Provided patient with RADIATION THERAPY AND YOU handbook then, reviewed pertinent information with the patient. Reviewed potential side effects and management. Provided this patient with this writer's business card and encouraged to call with needs. All questions answered. Patient verbalized understanding of all things reviewed.

## 2011-11-17 ENCOUNTER — Ambulatory Visit: Payer: Medicaid Other

## 2011-11-17 ENCOUNTER — Ambulatory Visit
Admission: RE | Admit: 2011-11-17 | Discharge: 2011-11-17 | Disposition: A | Discharge status: Home / Self Care | Payer: Medicaid Other | Source: Ambulatory Visit | Attending: Radiation Oncology | Admitting: Radiation Oncology

## 2011-11-18 ENCOUNTER — Ambulatory Visit: Payer: Medicaid Other

## 2011-11-18 ENCOUNTER — Telehealth: Payer: Self-pay | Admitting: *Deleted

## 2011-11-18 ENCOUNTER — Ambulatory Visit: Payer: Medicaid Other | Attending: Oncology

## 2011-11-18 ENCOUNTER — Ambulatory Visit
Admission: RE | Admit: 2011-11-18 | Discharge: 2011-11-18 | Disposition: A | Discharge status: Home / Self Care | Payer: Medicaid Other | Source: Ambulatory Visit | Attending: Radiation Oncology | Admitting: Radiation Oncology

## 2011-11-18 DIAGNOSIS — IMO0001 Reserved for inherently not codable concepts without codable children: Secondary | ICD-10-CM | POA: Insufficient documentation

## 2011-11-18 DIAGNOSIS — R1311 Dysphagia, oral phase: Secondary | ICD-10-CM | POA: Insufficient documentation

## 2011-11-18 NOTE — Telephone Encounter (Signed)
Pt called to report "a lot of nausea" for past 2 days and diarrhea started last night..  1st chemo, CDDP, was 2 days ago.  Pt denies any vomiting or fevers. States has been taking compazine and lorazepam q 6 hrs and just started zofran this morning as he was told to wait to start it until today.  Instructed pt to get some imodium and take as directed for diarrhea.  Instructed to continue zofran q 12hrs for next 3 days, to alternate compazine and ativan so he is taking one of them every 3 hrs, and to take plenty of fluid via PEG tube to prevent dehydration.  Instructed call back if nausea continues in spite of above measures.  He verbalized understanding.

## 2011-11-18 NOTE — Telephone Encounter (Signed)
Called pt to check on his symptoms.  No answer, left VM instructing pt to call back if any unrelieved symptoms, otherwise continue to push fluids and take meds as directed.

## 2011-11-21 ENCOUNTER — Ambulatory Visit: Payer: Medicaid Other

## 2011-11-21 ENCOUNTER — Ambulatory Visit
Admission: RE | Admit: 2011-11-21 | Discharge: 2011-11-21 | Disposition: A | Discharge status: Home / Self Care | Payer: Medicaid Other | Source: Ambulatory Visit | Attending: Radiation Oncology | Admitting: Radiation Oncology

## 2011-11-21 ENCOUNTER — Other Ambulatory Visit (HOSPITAL_BASED_OUTPATIENT_CLINIC_OR_DEPARTMENT_OTHER): Payer: Self-pay | Admitting: Lab

## 2011-11-21 ENCOUNTER — Encounter: Payer: Self-pay | Admitting: Radiation Oncology

## 2011-11-21 VITALS — BP 134/85 | HR 82 | Resp 18 | Wt 178.1 lb

## 2011-11-21 DIAGNOSIS — C029 Malignant neoplasm of tongue, unspecified: Secondary | ICD-10-CM

## 2011-11-21 LAB — BASIC METABOLIC PANEL
CO2: 26 mEq/L (ref 19–32)
Calcium: 9.5 mg/dL (ref 8.4–10.5)
Potassium: 4.1 mEq/L (ref 3.5–5.3)
Sodium: 138 mEq/L (ref 135–145)

## 2011-11-21 NOTE — Progress Notes (Signed)
Patient presents to the clinic today unaccompanied for under treat visit with Dr. Dayton Scrape. Patient is alert and oriented to person, place, and time. No distress noted. Steady gait noted. Pleasant affect noted. Patient denies any pain at this time. Patient reports that Wed, Thurs, Fri were miserable related to the effect of chemotherapy. Patient reports he is feeling much better today. Patient reports that part of his surgical site remain numb. Patient reports that he is eating a little more by mouth but, continues to use his feeding tube. Reported all findings to Dr. Dayton Scrape.

## 2011-11-21 NOTE — Progress Notes (Signed)
Weekly Management Note:  Site:L neck/oral cavity Current Dose:  1000  cGy Projected Dose: 6600  cGy  Narrative: The patient is seen today for routine under treatment assessment. CBCT/MVCT images/port films were reviewed. The chart was reviewed.   No complaints today. He does report slight drainage along his left upper neck wound and submental region, but this is diminished. His chemotherapy last week was reasonably well tolerated, although he was "miserable" for 3 days.  Physical Examination:  Filed Vitals:   11/21/11 1032  BP: 134/85  Pulse: 82  Resp: 18  .  Weight: 178 lb 1.6 oz (80.786 kg). On inspection of his left neck there or millimeter sized nodules along his scar felt represented granulating tissue. No palpable adenopathy in the neck. Oral cavity without candidiasis or mucositis.    Blood counts have remained satisfactory.  Impression: Tolerating radiation therapy well.  Plan: Continue radiation therapy as planned.

## 2011-11-22 ENCOUNTER — Ambulatory Visit (HOSPITAL_BASED_OUTPATIENT_CLINIC_OR_DEPARTMENT_OTHER): Payer: Self-pay | Admitting: Oncology

## 2011-11-22 ENCOUNTER — Ambulatory Visit
Admission: RE | Admit: 2011-11-22 | Discharge: 2011-11-22 | Disposition: A | Discharge status: Home / Self Care | Payer: Medicaid Other | Source: Ambulatory Visit | Attending: Radiation Oncology | Admitting: Radiation Oncology

## 2011-11-22 ENCOUNTER — Telehealth: Payer: Self-pay | Admitting: Oncology

## 2011-11-22 ENCOUNTER — Ambulatory Visit: Payer: Medicaid Other

## 2011-11-22 VITALS — BP 137/84 | HR 81 | Temp 98.7°F | Ht 72.0 in | Wt 176.7 lb

## 2011-11-22 DIAGNOSIS — C029 Malignant neoplasm of tongue, unspecified: Secondary | ICD-10-CM

## 2011-11-22 DIAGNOSIS — Z09 Encounter for follow-up examination after completed treatment for conditions other than malignant neoplasm: Secondary | ICD-10-CM

## 2011-11-22 DIAGNOSIS — E46 Unspecified protein-calorie malnutrition: Secondary | ICD-10-CM

## 2011-11-22 DIAGNOSIS — H9319 Tinnitus, unspecified ear: Secondary | ICD-10-CM

## 2011-11-22 MED ORDER — HYDROCODONE-ACETAMINOPHEN 7.5-500 MG/15ML PO SOLN: 15.0000 mL | Freq: Four times a day (QID) | ORAL | Status: AC | PRN

## 2011-11-22 MED ORDER — LORAZEPAM 0.5 MG PO TABS: 0.5000 mg | ORAL_TABLET | Freq: Four times a day (QID) | ORAL | Status: DC | PRN

## 2011-11-22 NOTE — Progress Notes (Signed)
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  He developed recurrence in the left submandibular gland in August 2012.  He underwent on 07/27/2011 radical left neck dissection by Dr. Ezzard Standing and Dr. Geryl Rankins. Pathology case number [id number] showed total 15 lymph nodes from left radical neck dissection that was all negative. However the left submandibular gland mass showed a 3.5 cm invasive SCC, moderately differentiates involving the adjacent skeletal muscle tissue and bone a tissue and extending into the inked margin. There was no evidence of any lymphatic invasion or perineural invasion.  He underwent further resection by Dr. Mary Sella at [facility] on 09/29/2011.  I'm awaiting path from that resection.   CURRENT THERAPY:  Started adjuvant chemoradiation with q3wk Cisplatin on 11/14/11 and daily radiation.   INTERIM HISTORY:  Thomas Bean returns to clinic today for regular follow up.  He reported that he has been having tinnitus the last few days with problem with high pitch voice and music that sounds scratchy.  He denies hearing loss with normal conversation level.  He had some nausea/but no vomiting.  He is doing mouth rinses 4-5x/day.  He thinks that he is taking in about 1.5L fluid daily.  He eats eggs, soups.  He puts in PEG tube about 1-2 cans of supplement daily.  He denies PEG tube pain, erythema, purulent discharge.  He has mild fatigue; however, he is still independent of daily livings.  He lives by himself and takes care of his own driving, grocery shopping.  He still has some clear discharge from the left neck at the site of graft.  He takes some Lortab about 1-2x/day.  He would like to change Lortab to elixir.   Patient  denies headache, visual changes, confusion, drenching night sweats, palpable lymph node swelling, mucositis, odynophagia, dysphagia, nausea vomiting, jaundice, chest pain, palpitation, shortness of breath, dyspnea on exertion, productive cough, gum bleeding, epistaxis, hematemesis, hemoptysis, abdominal pain, abdominal swelling, early satiety, melena, hematochezia, hematuria, skin rash, spontaneous bleeding, joint swelling, joint pain, heat or cold intolerance, bowel bladder incontinence, back pain, focal motor weakness, paresthesia, depression, suicidal or homocidal ideation, feeling hopelessness.    MEDICAL HISTORY: Past Medical History  Diagnosis Date  . Hemorrhoids   . PONV (postoperative nausea and vomiting)   . Hypertension     no medications at present  . GERD (gastroesophageal reflux disease)     no medication  . Depression     treated approx 12 years ago, no treatment at this time  . Hiatal hernia   . Cancer     left side of tongue T1 lesion excised 4/12    SURGICAL HISTORY:  Past Surgical History  Procedure Date  . Inguinal herniorrhapy   . Tonsillectomy and adenoidectomy   . Excision of tongue mass     April 2012  . Hernia repair   . Neck mass excision 07/27/2011  . Radical neck dissection 07/27/2011    Procedure: RADICAL NECK DISSECTION;  Surgeon: Drema Halon, MD;  Location: [facility] OR;  Service: ENT;  Laterality: Left;  Marland Kitchen Mandibulectomy 09/2011    and scapular free flap    MEDICATIONS: Current Outpatient Prescriptions  Medication Sig Dispense Refill  . chlorhexidine (PERIDEX) 0.12 % solution Use as  directed 15 mLs in the mouth or throat 2 (two) times daily.        Marland Kitchen LORazepam (ATIVAN) 0.5 MG tablet Take 1 tablet (0.5 mg total) by mouth every 6 (six) hours as needed (Nausea or vomiting).  90 tablet  0  . ondansetron (ZOFRAN) 8 MG tablet Take 1 tablet two times a day as needed for nausea or vomiting starting on the third day after chemotherapy.  30 tablet  1  .  prochlorperazine (COMPAZINE) 10 MG tablet Take 1 tablet (10 mg total) by mouth every 6 (six) hours as needed (Nausea or vomiting).  30 tablet  1  . HYDROcodone-acetaminophen (LORTAB) 7.5-500 MG/15ML solution Take 15 mLs by mouth every 6 (six) hours as needed for pain.  480 mL  0  . HYDROcodone-acetaminophen (VICODIN) 5-500 MG per tablet Take 1 tablet by mouth every 6 (six) hours as needed. Pain       . ibuprofen (ADVIL,MOTRIN) 200 MG tablet Take 400 mg by mouth every 6 (six) hours as needed. Pain       . loratadine (CLARITIN) 10 MG tablet Take 10 mg by mouth daily as needed. FOR ALLERGIES         ALLERGIES:  is allergic to codeine phosphate and tape.  REVIEW OF SYSTEMS:  The rest of the 14-point review of system was negative.   Filed Vitals:   11/22/11 1034  BP: 137/84  Pulse: 81  Temp: 98.7 F (37.1 C)   Wt Readings from Last 3 Encounters:  11/22/11 176 lb 11.2 oz (80.151 kg)  11/21/11 178 lb 1.6 oz (80.786 kg)  11/16/11 180 lb 1.6 oz (81.693 kg)   ECOG Performance status: 1  PHYSICAL EXAMINATION:   General:  well-nourished in no acute distress.  Eyes:  no scleral icterus.  ENT:  There was filing defect in left mandible.  External flap in left mandible from left axilla. The flap is now less edematous, without purulent discharge, erythema, or pain on palpation.  Trach stoma is now closed with no purulent discharge.  Lymphatics:  Negative cervical, supraclavicular or axillary adenopathy.  Respiratory: lungs were clear bilaterally without wheezing or crackles.  Cardiovascular:  Regular rate and rhythm, S1/S2, without murmur, rub or gallop.  There was no pedal edema.  GI:  abdomen was soft, flat, nontender, nondistended, without organomegaly. PEG tube dry/clean/intact.  Muscoloskeletal:  no spinal tenderness of palpation of vertebral spine.  Skin exam was without echymosis, petichae.  Left axilla site where the flap was harvested has staples in place with no erythema, purulent discharge or  pain on palpation.    Patient was able to get on and off exam table without assistance.  Gait was normal.  Patient was alerted and oriented.  Attention was good.   Language was appropriate.  Mood was normal without depression.  Speech was not pressured.  Thought content was not tangential.     LABORATORY/RADIOLOGY DATA:  Lab Results  Component Value Date   WBC 6.0 11/14/2011   HGB 10.6* 11/14/2011   HCT 31.8* 11/14/2011   PLT 236 11/14/2011   GLUCOSE 97 11/21/2011   CHOL 160 02/14/2008   TRIG 442* 02/14/2008   HDL 26.5* 02/14/2008   LDLDIRECT 64.8 02/14/2008   ALT 17 11/14/2011   AST 20 11/14/2011   NA 138 11/21/2011   K 4.1 11/21/2011   CL 103 11/21/2011   CREATININE 0.69 11/21/2011   BUN 8 11/21/2011   CO2 26 11/21/2011   TSH 2.38 02/14/2008  ASSESSMENT AND PLAN:   1. Smoking: none at this time.  2. Recurrent left lateral tongue squamous cell carcinoma:  He is doing well with adjuvant chemoradiation.  He has grade 1 nausea/vomiting, grade 2 tinnitus, grade 1 fatigue.  Next cycle of chemo is due 12/05/2011.   3.  Calorie/Protein malnutrition:  I advised him to have more oral intake and use PEG tube only as supplement per instruction by Nutrition.   4.  Mucositis prevention:  Salt/baking soda mouth rinses and Biotene 4-6x/day.  5.  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  If problem persists or worsens, we may consider weekly Cisplatin when the 2nd cycle of chemo is due to start.   6.  Nausea/vomiting:  He has Compazine/Zofran/Ativan prn.  I refilled his Ativan today.   7.  Mouth pain from resection and ongoing chemoradiation:  I prescribed Lortab elixir today instead of refilling Lortab tablet.   His pain is mild-moderate at this time. In the future, if it worsens, I will add on fentanyl patch.    8.  Follow up:  Lab 11/30/2011.  I'll see him on 12/01/2011 in anticipation of chemo on 12/05/2011.   The length of time of the face-to-face encounter was 15 minutes. More than 50% of time was  spent counseling and coordination of care.

## 2011-11-22 NOTE — Telephone Encounter (Signed)
appts made and printed for pt aom °

## 2011-11-23 ENCOUNTER — Ambulatory Visit: Payer: Medicaid Other

## 2011-11-23 ENCOUNTER — Ambulatory Visit
Admission: RE | Admit: 2011-11-23 | Discharge: 2011-11-23 | Disposition: A | Discharge status: Home / Self Care | Payer: Medicaid Other | Source: Ambulatory Visit | Attending: Radiation Oncology | Admitting: Radiation Oncology

## 2011-11-24 ENCOUNTER — Ambulatory Visit
Admission: RE | Admit: 2011-11-24 | Discharge: 2011-11-24 | Disposition: A | Discharge status: Home / Self Care | Payer: Medicaid Other | Source: Ambulatory Visit | Attending: Radiation Oncology | Admitting: Radiation Oncology

## 2011-11-24 ENCOUNTER — Ambulatory Visit: Payer: Medicaid Other

## 2011-11-25 ENCOUNTER — Ambulatory Visit
Admission: RE | Admit: 2011-11-25 | Discharge: 2011-11-25 | Disposition: A | Discharge status: Home / Self Care | Payer: Medicaid Other | Source: Ambulatory Visit | Attending: Radiation Oncology | Admitting: Radiation Oncology

## 2011-11-25 ENCOUNTER — Ambulatory Visit: Payer: Medicaid Other

## 2011-11-25 ENCOUNTER — Ambulatory Visit: Payer: Self-pay | Admitting: Nutrition

## 2011-11-25 NOTE — Progress Notes (Signed)
Thomas Bean reports quite severe nausea for 3 days after his 1st cisplatin treatment.  He was not able to eat very much.  He states he did take his nausea medication as prescribed.  On the 4th day he woke up and the nausea was pretty much gone.  He is using a minimum of 6 cans of Ensure Plus daily by mouth or his feeding tube.  He has added in protein powder to total about 30 additional grams daily.  He is trying to eat some soft moist foods.  He complains of food beginning to taste a bit bland to him.  His weight has increased 2.3 pounds since March 12.  He complains of boredom with his oral intake.  NUTRITION DIAGNOSIS:  Inadequate oral intake continues.  INTERVENTION:  I have provided the patient with recipes to increase his oral intake to include milkshake recipes.  I have educated him on methods for pureeing foods to be a soft moist texture that he can swallow.  I have encouraged him to continue to utilize Ensure Plus by mouth or through feeding tube.  He is to continue some additional protein powder or Pro stat liquid to meet his minimum protein needs.  I provided him with additional fact sheets and recipes, and have answered his questions.   MONITORING/EVALUATION/GOALS:  The patient has been able to increase his oral intake and he is tolerating tube feedings and oral intake to minimize further weight loss.  NEXT VISIT:  Monday, March 25th during chemotherapy.    ______________________________ Zenovia Jarred, RD, LDN Clinical Nutrition Specialist BN/MEDQ  D:  11/25/2011  T:  11/25/2011  Job:  [id number]

## 2011-11-28 ENCOUNTER — Ambulatory Visit
Admission: RE | Admit: 2011-11-28 | Discharge: 2011-11-28 | Disposition: A | Discharge status: Home / Self Care | Payer: Medicaid Other | Source: Ambulatory Visit | Attending: Radiation Oncology | Admitting: Radiation Oncology

## 2011-11-28 ENCOUNTER — Ambulatory Visit: Payer: Medicaid Other

## 2011-11-28 ENCOUNTER — Encounter: Payer: Self-pay | Admitting: Radiation Oncology

## 2011-11-28 VITALS — BP 139/93 | HR 88 | Resp 18 | Wt 180.3 lb

## 2011-11-28 DIAGNOSIS — C029 Malignant neoplasm of tongue, unspecified: Secondary | ICD-10-CM

## 2011-11-28 MED ORDER — BIAFINE EX EMUL
Freq: Every day | CUTANEOUS | Status: DC
  Administered 2011-11-28: 18:00:00 via TOPICAL

## 2011-11-28 NOTE — Progress Notes (Signed)
Weekly Management Note:  Site:L Neck/oral cavity Current Dose:  2000  cGy Projected Dose: 6600  cGy  Narrative: The patient is seen today for routine under treatment assessment. CBCT/MVCT images/port films were reviewed. The chart was reviewed.   He feels that his hearing is diminished bilaterally. He does have a few "sores" on his left lower lip. He still has normal drainage along his left submandibular wound region.  Physical Examination:  Filed Vitals:   11/28/11 1036  BP: 139/93  Pulse: 88  Resp: 18  .  Weight: 180 lb 4.8 oz (81.784 kg). Oral cavity is unremarkable. There is a small ulcer along his left lower lip, presumed from his chemotherapy. There is no adenopathy in the neck. No fluid can be expressed.  Lab Results  Component Value Date   WBC 6.0 11/14/2011   HGB 10.6* 11/14/2011   HCT 31.8* 11/14/2011   MCV 91.8 11/14/2011   PLT 236 11/14/2011   Impression: Tolerating radiation therapy well.  Plan: Continue radiation therapy as planned.

## 2011-11-28 NOTE — Progress Notes (Signed)
Encounter addended by: Agnes Lawrence, RN on: 11/28/2011  6:08 PM<BR>     Documentation filed: Inpatient MAR

## 2011-11-28 NOTE — Progress Notes (Signed)
Encounter addended by: Agnes Lawrence, RN on: 11/28/2011  6:05 PM<BR>     Documentation filed: Orders

## 2011-11-28 NOTE — Progress Notes (Signed)
Patient presents to the clinic today accompanied by his father for an under treat visit with Dr. Dayton Scrape. Patient is alert and oriented to person, place, and time. No distress noted. Steady gait noted. Pleasant affect noted. Patient denies pain at this time. Patient reports eating as much by mouth as possible but, continues to take in at least 3 ensures per day either by mouth or via peg tube. Patient reports mild taste changes. Hyperpigmentation of the treated area noted. Provided patient with Biafine and instructed upon use. Patient verbalized understanding. Ulcerations noted along left lower lip, left tongue and left cheek. Patient reports that the left side of his mouth continues to be numb and stiff. Encouraged patient to exercise jaw. Patient verbalized understanding. Patient reports last cisplatin treat was 11/15/2011 and next is scheduled for one week from today. Patient reports he has always had ringing of the ears but it has increased in frequency. Patient reports ringing in both ears. Reported all findings to Dr. Dayton Scrape.

## 2011-11-29 ENCOUNTER — Ambulatory Visit: Payer: Medicaid Other

## 2011-11-29 ENCOUNTER — Ambulatory Visit
Admission: RE | Admit: 2011-11-29 | Discharge: 2011-11-29 | Disposition: A | Discharge status: Home / Self Care | Payer: Medicaid Other | Source: Ambulatory Visit | Attending: Radiation Oncology | Admitting: Radiation Oncology

## 2011-11-30 ENCOUNTER — Ambulatory Visit
Admission: RE | Admit: 2011-11-30 | Discharge: 2011-11-30 | Disposition: A | Discharge status: Home / Self Care | Payer: Medicaid Other | Source: Ambulatory Visit | Attending: Radiation Oncology | Admitting: Radiation Oncology

## 2011-11-30 ENCOUNTER — Ambulatory Visit: Payer: Medicaid Other

## 2011-11-30 ENCOUNTER — Ambulatory Visit (HOSPITAL_COMMUNITY): Payer: Self-pay | Admitting: Dentistry

## 2011-11-30 ENCOUNTER — Other Ambulatory Visit (HOSPITAL_BASED_OUTPATIENT_CLINIC_OR_DEPARTMENT_OTHER): Payer: Self-pay | Admitting: Lab

## 2011-11-30 VITALS — BP 143/82 | HR 85 | Temp 98.3°F

## 2011-11-30 DIAGNOSIS — K1233 Oral mucositis (ulcerative) due to radiation: Secondary | ICD-10-CM

## 2011-11-30 DIAGNOSIS — C029 Malignant neoplasm of tongue, unspecified: Secondary | ICD-10-CM

## 2011-11-30 DIAGNOSIS — R432 Parageusia: Secondary | ICD-10-CM

## 2011-11-30 DIAGNOSIS — K117 Disturbances of salivary secretion: Secondary | ICD-10-CM

## 2011-11-30 DIAGNOSIS — R1310 Dysphagia, unspecified: Secondary | ICD-10-CM

## 2011-11-30 LAB — CBC WITH DIFFERENTIAL/PLATELET
Eosinophils Absolute: 0.2 10*3/uL (ref 0.0–0.5)
HCT: 32.2 % — ABNORMAL LOW (ref 38.4–49.9)
LYMPH%: 20.2 % (ref 14.0–49.0)
MONO#: 0.3 10*3/uL (ref 0.1–0.9)
NEUT#: 3 10*3/uL (ref 1.5–6.5)
NEUT%: 67.8 % (ref 39.0–75.0)
Platelets: 144 10*3/uL (ref 140–400)
WBC: 4.4 10*3/uL (ref 4.0–10.3)

## 2011-11-30 LAB — COMPREHENSIVE METABOLIC PANEL
BUN: 9 mg/dL (ref 6–23)
CO2: 25 mEq/L (ref 19–32)
Creatinine, Ser: 0.63 mg/dL (ref 0.50–1.35)
Glucose, Bld: 86 mg/dL (ref 70–99)
Total Bilirubin: 0.2 mg/dL — ABNORMAL LOW (ref 0.3–1.2)
Total Protein: 6.8 g/dL (ref 6.0–8.3)

## 2011-11-30 NOTE — Progress Notes (Signed)
Wednesday, November 30, 2011   BP:143/82                    P:  85            T:  98.3           Wgt: 180 lbs now at start 198 lbs  Thomas Bean is a 51 year old male that underwent surgical resection for squamous cell carcinoma left lateral tongue. Patient currently undergoing active chemoradiation therapy. Patient now presents for a periodic oral examination during radiation therapy.    Patient has completed  12 of 33 radiation treatments.  REVIEW OF CHIEF COMPLAINTS:  DRY MOUTH: Getting very dry HARD TO SWALLOW: Yes. HURT TO SWALLOW: Yes TASTE CHANGES:  Taste is starting to diminish SORES IN MOUTH: Has some sores on his tongue TRISMUS SYMPTOMS:  SYMPTOM RELIEF:  Using salt water baking soda,  and Biotene Rinses. HOME ORAL HYGIENE REGIMEN:  BRUSHING: 3 X a day FLOSSING: 1 X a day RINSING: see above FLUORIDE: Doing fluoride at night with no problems. TRISMUS EXERCISES: Maximim interincisal opening: 33 mm.   DENTAL EXAM: ORAL HYGIENE(PLAQUE): Good oral hygiene. Minimal plaque. LOCATION OF MUCOSITIS: Right and left buccal mucosa and tongue. DESCRIPTION OF SALIVA: Decreased. Ropey. ANY EXPOSED BONE: None noted. OTHER WATCHED AREAS: Surgical site. DIAGNOSES: 1.  Xerostomia  2.  Dysgeusia 3.  Dysphagia 4.  Odynophagia 5.  Mucositis 6.  Weight Loss  RECOMMENDATIONS: 1. Brush after meals and at bedtime. Use fluoride at bedtime. 2. Use trismus exercises as directed. 3. Use Biotene Rinse or salt water/baking soda rinses. 4. Multiple sips of water as needed. 5. RTC in two months for periodic oral examination status post radiation therapy . Call if problems before then.  Dr. Cindra Eves

## 2011-12-01 ENCOUNTER — Ambulatory Visit (HOSPITAL_BASED_OUTPATIENT_CLINIC_OR_DEPARTMENT_OTHER): Payer: Self-pay | Admitting: Oncology

## 2011-12-01 ENCOUNTER — Telehealth: Payer: Self-pay | Admitting: Oncology

## 2011-12-01 ENCOUNTER — Ambulatory Visit
Admission: RE | Admit: 2011-12-01 | Discharge: 2011-12-01 | Disposition: A | Discharge status: Home / Self Care | Payer: Medicaid Other | Source: Ambulatory Visit | Attending: Radiation Oncology | Admitting: Radiation Oncology

## 2011-12-01 ENCOUNTER — Ambulatory Visit: Payer: Medicaid Other

## 2011-12-01 ENCOUNTER — Encounter: Payer: Self-pay | Admitting: Oncology

## 2011-12-01 VITALS — BP 138/83 | HR 87 | Temp 98.0°F | Ht 72.0 in | Wt 181.6 lb

## 2011-12-01 DIAGNOSIS — C029 Malignant neoplasm of tongue, unspecified: Secondary | ICD-10-CM

## 2011-12-01 DIAGNOSIS — Z09 Encounter for follow-up examination after completed treatment for conditions other than malignant neoplasm: Secondary | ICD-10-CM

## 2011-12-01 DIAGNOSIS — E46 Unspecified protein-calorie malnutrition: Secondary | ICD-10-CM

## 2011-12-01 NOTE — Telephone Encounter (Signed)
gv pt appt for march -april2013 

## 2011-12-01 NOTE — Progress Notes (Signed)
[facility] OFFICE PROGRESS NOTE  Cc:  Thomas Petit, MD, MD  DIAGNOSIS: Recurrent left submandibular squamous cell carcinoma with primary from left lateral tongue.    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  He developed recurrence in the left submandibular gland in August 2012.  He underwent on 07/27/2011 radical left neck dissection by Dr. Ezzard Standing and Dr. Geryl Rankins. Pathology case number [id number] showed total 15 lymph nodes from left radical neck dissection that was all negative. However the left submandibular gland mass showed a 3.5 cm invasive SCC, moderately differentiates involving the adjacent skeletal muscle tissue and bone a tissue and extending into the inked margin. There was no evidence of any lymphatic invasion or perineural invasion.  He underwent further resection by Dr. Mary Sella at [facility] on 09/29/2011.  I'm awaiting path from that resection.   CURRENT THERAPY:  Started adjuvant chemoradiation with q3wk Cisplatin on 11/14/11 and daily radiation.   INTERIM HISTORY:  Thomas Bean returns to clinic today for regular follow up prior to chemotherapy on 12/05/11.  He continues to have tinnitus with problem with high pitch voice and music that sounds scratchy. Unchanged from previous reports.  He denies hearing loss with normal conversation level.  He had nausea for about 3-4 days after his first cycle of chemotherapy. No vomiting. Nausea has now resolved. Notices and increase in mouth pain with a sore throat. He is doing mouth rinses 4-5x/day.  He thinks that he is taking in about 1.5L fluid daily. Putting Ensure through his PEG tube, the amount varies on how much he is taking by mouth. He denies PEG tube pain, erythema, purulent discharge.  He has mild fatigue; however, he is still independent of daily livings.  He lives by himself and takes care of his own driving, grocery  shopping.  He still has some clear discharge from the left neck at the site of graft.  He takes some Lortab elixir about 1-2x/day.    Patient denies headache, visual changes, confusion, drenching night sweats, palpable lymph node swelling, dysphagia, nausea vomiting, jaundice, chest pain, palpitation, shortness of breath, dyspnea on exertion, productive cough, gum bleeding, epistaxis, hematemesis, hemoptysis, abdominal pain, abdominal swelling, early satiety, melena, hematochezia, hematuria, skin rash, spontaneous bleeding, joint swelling, joint pain, heat or cold intolerance, bowel bladder incontinence, back pain, focal motor weakness, paresthesia, depression, suicidal or homocidal ideation, feeling hopelessness.    MEDICAL HISTORY: Past Medical History  Diagnosis Date  . Hemorrhoids   . PONV (postoperative nausea and vomiting)   . Hypertension     no medications at present  . GERD (gastroesophageal reflux disease)     no medication  . Depression     treated approx 12 years ago, no treatment at this time  . Hiatal hernia   . Cancer     left side of tongue T1 lesion excised 4/12    SURGICAL HISTORY:  Past Surgical History  Procedure Date  . Inguinal herniorrhapy   . Tonsillectomy and adenoidectomy   . Excision of tongue mass     April 2012  . Hernia repair   . Neck mass excision 07/27/2011  . Radical neck dissection 07/27/2011    Procedure: RADICAL NECK DISSECTION;  Surgeon: Drema Halon, MD;  Location: [facility] OR;  Service: ENT;  Laterality: Left;  Marland Kitchen Mandibulectomy 09/2011    and scapular free flap    MEDICATIONS: Current Outpatient Prescriptions  Medication Sig Dispense Refill  . alum & mag hydroxide-simeth (MAALOX/MYLANTA) 200-200-20 MG/5ML suspension Take 15 mLs by mouth as needed.      Marland Kitchen emollient (BIAFINE) cream Apply topically daily.      Marland Kitchen HYDROcodone-acetaminophen (LORTAB) 7.5-500 MG/15ML solution Take 15 mLs by mouth every 6 (six) hours as needed for pain.  480  mL  0  . LORazepam (ATIVAN) 0.5 MG tablet Take 1 tablet (0.5 mg total) by mouth every 6 (six) hours as needed (Nausea or vomiting).  90 tablet  0  . ondansetron (ZOFRAN) 8 MG tablet Take 1 tablet two times a day as needed for nausea or vomiting starting on the third day after chemotherapy.  30 tablet  1  . prochlorperazine (COMPAZINE) 10 MG tablet Take 1 tablet (10 mg total) by mouth every 6 (six) hours as needed (Nausea or vomiting).  30 tablet  1  . chlorhexidine (PERIDEX) 0.12 % solution Use as directed 15 mLs in the mouth or throat 2 (two) times daily.        Marland Kitchen HYDROcodone-acetaminophen (VICODIN) 5-500 MG per tablet Take 1 tablet by mouth every 6 (six) hours as needed. Pain       . ibuprofen (ADVIL,MOTRIN) 200 MG tablet Take 400 mg by mouth every 6 (six) hours as needed. Pain       . loratadine (CLARITIN) 10 MG tablet Take 10 mg by mouth daily as needed. FOR ALLERGIES         ALLERGIES:  is allergic to codeine phosphate and tape.  REVIEW OF SYSTEMS:  The rest of the 14-point review of system was negative.   Filed Vitals:   12/01/11 0856  BP: 138/83  Pulse: 87  Temp: 98 F (36.7 C)   Wt Readings from Last 3 Encounters:  12/01/11 181 lb 9.6 oz (82.373 kg)  11/28/11 180 lb 4.8 oz (81.784 kg)  11/25/11 179 lb (81.194 kg)   ECOG Performance status: 1  PHYSICAL EXAMINATION:   General:  well-nourished in no acute distress.  Eyes:  no scleral icterus.  ENT:  There was filing defect in left mandible.  External flap in left mandible from left axilla. The flap is now less edematous, without purulent discharge, erythema, or pain on palpation.  Trach stoma is now closed with no purulent discharge.  Lymphatics:  Negative cervical, supraclavicular or axillary adenopathy.  Respiratory: lungs were clear bilaterally without wheezing or crackles.  Cardiovascular:  Regular rate and rhythm, S1/S2, without murmur, rub or gallop.  There was no pedal edema.  GI:  abdomen was soft, flat, nontender,  nondistended, without organomegaly. PEG tube dry/clean/intact.  Muscoloskeletal:  no spinal tenderness of palpation of vertebral spine.  Skin exam was without echymosis, petichae.  Left axilla site where the flap was harvested has staples in place with no erythema, purulent discharge or pain on palpation.    Patient was able to get on and off exam table without assistance.  Gait was normal.  Patient was alerted and oriented.  Attention was good.   Language was appropriate.  Mood was normal without depression.  Speech was not pressured.  Thought content was not tangential.     LABORATORY/RADIOLOGY DATA:  Lab Results  Component Value Date   WBC 4.4 11/30/2011   HGB 10.3* 11/30/2011   HCT 32.2* 11/30/2011   PLT 144 11/30/2011   GLUCOSE 86 11/30/2011   CHOL 160 02/14/2008   TRIG 442* 02/14/2008   HDL 26.5* 02/14/2008   LDLDIRECT 64.8 02/14/2008   ALT  13 11/30/2011   AST 15 11/30/2011   NA 141 11/30/2011   K 4.1 11/30/2011   CL 104 11/30/2011   CREATININE 0.63 11/30/2011   BUN 9 11/30/2011   CO2 25 11/30/2011   TSH 2.38 02/14/2008     ASSESSMENT AND PLAN:   1. Smoking: none at this time.  2. Recurrent left lateral tongue squamous cell carcinoma: He is doing well with adjuvant chemoradiation.  Next cycle of chemo is due 12/05/2011. Counts are acceptable and he will proceed with chemotherapy as scheduled.   3.  Calorie/Protein malnutrition: Weight stable. Taking PO well with Ensure through PEG as well. He was encouraged to continue to eat as much as possible. Has nutrition follow-up on 12/05/11.  4.  Mucositis prevention:  Salt/baking soda mouth rinses and Biotene 4-6x/day. He was encouraged to continue aggressive mouth rinses.   5.  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  If problem persists or worsens, we may need consider weekly Cisplatin.   6.  Nausea/vomiting:  He has Compazine/Zofran/Ativan prn.   7.  Mouth pain from resection and ongoing chemoradiation: Continue PRN Lortab elixir. Currently  using 1-2 times per day. His pain is mild-moderate at this time. In the future, if it worsens, consider addition of fentanyl patch.    8.  Follow up:  Plan for patient to have follow-up labs on 12/09/11 with a follow-up visit on 12/12/11. Third cycle of chemotherapy is currently scheduled for 12/26/11.  The patient was seen and examined with Dr Gaylyn Rong. >90% of plan of care developed by dr Gaylyn Rong. The length of time of the face-to-face encounter was 15 minutes. More than 50% of time was spent counseling and coordination of care.

## 2011-12-02 ENCOUNTER — Ambulatory Visit: Payer: Medicaid Other

## 2011-12-02 ENCOUNTER — Ambulatory Visit
Admission: RE | Admit: 2011-12-02 | Discharge: 2011-12-02 | Disposition: A | Discharge status: Home / Self Care | Payer: Medicaid Other | Source: Ambulatory Visit | Attending: Radiation Oncology | Admitting: Radiation Oncology

## 2011-12-05 ENCOUNTER — Ambulatory Visit
Admission: RE | Admit: 2011-12-05 | Discharge: 2011-12-05 | Disposition: A | Discharge status: Home / Self Care | Payer: Medicaid Other | Source: Ambulatory Visit | Attending: Radiation Oncology | Admitting: Radiation Oncology

## 2011-12-05 ENCOUNTER — Encounter: Payer: Self-pay | Admitting: Radiation Oncology

## 2011-12-05 ENCOUNTER — Ambulatory Visit: Payer: Medicaid Other

## 2011-12-05 ENCOUNTER — Ambulatory Visit: Payer: Self-pay | Admitting: Nutrition

## 2011-12-05 ENCOUNTER — Ambulatory Visit (HOSPITAL_BASED_OUTPATIENT_CLINIC_OR_DEPARTMENT_OTHER): Payer: Self-pay

## 2011-12-05 VITALS — BP 143/89 | HR 75 | Resp 18 | Wt 181.4 lb

## 2011-12-05 VITALS — BP 139/91 | HR 93 | Temp 98.4°F

## 2011-12-05 DIAGNOSIS — C029 Malignant neoplasm of tongue, unspecified: Secondary | ICD-10-CM

## 2011-12-05 DIAGNOSIS — Z5111 Encounter for antineoplastic chemotherapy: Secondary | ICD-10-CM

## 2011-12-05 MED ORDER — SODIUM CHLORIDE 0.9 % IV SOLN
150.0000 mg | Freq: Once | INTRAVENOUS | Status: AC
  Administered 2011-12-05: 150 mg via INTRAVENOUS
  Filled 2011-12-05: qty 5

## 2011-12-05 MED ORDER — OXYCODONE HCL 10 MG PO TB12: 10.0000 mg | ORAL_TABLET | Freq: Two times a day (BID) | ORAL | Status: AC

## 2011-12-05 MED ORDER — PALONOSETRON HCL INJECTION 0.25 MG/5ML
0.2500 mg | Freq: Once | INTRAVENOUS | Status: AC
  Administered 2011-12-05: 0.25 mg via INTRAVENOUS

## 2011-12-05 MED ORDER — POTASSIUM CHLORIDE 2 MEQ/ML IV SOLN
Freq: Once | INTRAVENOUS | Status: AC
  Administered 2011-12-05: 10:00:00 via INTRAVENOUS
  Filled 2011-12-05: qty 10

## 2011-12-05 MED ORDER — SODIUM CHLORIDE 0.9 % IV SOLN
100.0000 mg/m2 | Freq: Once | INTRAVENOUS | Status: AC
  Administered 2011-12-05: 211 mg via INTRAVENOUS
  Filled 2011-12-05: qty 211

## 2011-12-05 MED ORDER — FLUCONAZOLE 100 MG PO TABS: ORAL_TABLET | ORAL | Status: DC

## 2011-12-05 MED ORDER — DEXAMETHASONE SODIUM PHOSPHATE 4 MG/ML IJ SOLN
12.0000 mg | Freq: Once | INTRAMUSCULAR | Status: AC
  Administered 2011-12-05: 12 mg via INTRAVENOUS

## 2011-12-05 NOTE — Progress Notes (Signed)
Patient presents to the clinic today unaccompanied for an under treat visit with Dr. Dayton Scrape. Patient is alert and oriented to person, place, and time. No distress noted. Steady gait noted. Pleasant affect noted. Patient reports left side mouth pain 6 on a scale of 0-10 with taking Vicodin. Patient reports mouth pain is worse at night. Patient reports his tongue, roof of his mouth, and throat are "so sore" despite taking Vicodin via PEG tube. Patient reports that he has relied more on his PEG tube in the last 4-5 days. Patient reports thick saliva. Patient reports using Biafine x3 times per day. Patient reports he has no taste. Reported all findings to Dr. Dayton Scrape.

## 2011-12-05 NOTE — Progress Notes (Signed)
Weekly Management Note:  Site:L neck/oral cavity Current Dose:  3000  cGy Projected Dose: 6600  cGy  Narrative: The patient is seen today for routine under treatment assessment. CBCT/MVCT images/port films were reviewed. The chart was reviewed.   He is having more discomfort along his left mouth and takes Vicodin elixir almost every 6 hours. His pain is 6/10. He is getting most of his calories through his PEG tube although he does eat some soft foods by mouth. His weight remains stable. She also notes thickened saliva and altered sense of taste. He is receiving more chemotherapy later today. Blood tests of been satisfactory. He still has slight drainage along his left posterior neck wound. He uses Biafine cream along his left neck.  Physical Examination:  Filed Vitals:   12/05/11 1127  BP: 143/89  Pulse: 75  Resp: 18  .  Weight: 181 lb 6.4 oz (82.283 kg). There is erythema noted along the left neck. There is slight crusting along his left posterior neck wound, but no drainage can be expressed. No adenopathy appreciated on inspection of the oral cavity there are a few whitish lesions suggestive of candidiasis. There is mild to moderate mucositis which is patchy.  Impression: Tolerating radiation therapy well. I look that he does have candidiasis and I will start him on Diflucan for 10 days. I will also start him on OxyContin 10 mg by mouth twice a day. He is noted to have a codeine allergy, but he is already taking Vicodin without difficulty.  Plan: Continue radiation therapy as planned. As described above.

## 2011-12-05 NOTE — Progress Notes (Signed)
Thomas Bean reports it is getting more difficult and more painful for him to swallow, although he continues to try to eat some foods and drink liquids throughout the day.  He is using 4-5 cans of Ensure Plus via his feeding tube on a regular basis.  He continues to add some whey protein powder to increase his protein.  His weight is stable at 181.6 pounds.  He reports that food has no taste.  NUTRITION DIAGNOSIS:  Inadequate oral intake continues.  INTERVENTION:  I have encouraged the patient to continue to eat and drink what he can tolerate.  I have recommended that he try some strategies for increasing taste right before he eats based on tolerance. He is to continue to utilize a minimum of 5 Ensure Plus via his feeding tube, along with the foods that he is able to consume throughout the day.  MONITORING/EVALUATION/GOALS:  The patient has been able to increase oral intake and tolerate tube feedings to meet minimum estimated needs to promote weight maintenance.  NEXT VISIT:  Monday, April 15, during chemotherapy.    ______________________________ Zenovia Jarred, RD, LDN Clinical Nutrition Specialist BN/MEDQ  D:  12/05/2011  T:  12/05/2011  Job:  [id number]

## 2011-12-06 ENCOUNTER — Ambulatory Visit
Admission: RE | Admit: 2011-12-06 | Discharge: 2011-12-06 | Disposition: A | Discharge status: Home / Self Care | Payer: Medicaid Other | Source: Ambulatory Visit | Attending: Radiation Oncology | Admitting: Radiation Oncology

## 2011-12-06 ENCOUNTER — Ambulatory Visit: Payer: Medicaid Other

## 2011-12-07 ENCOUNTER — Ambulatory Visit
Admission: RE | Admit: 2011-12-07 | Discharge: 2011-12-07 | Disposition: A | Discharge status: Home / Self Care | Payer: Medicaid Other | Source: Ambulatory Visit | Attending: Radiation Oncology | Admitting: Radiation Oncology

## 2011-12-07 ENCOUNTER — Ambulatory Visit: Payer: Medicaid Other

## 2011-12-08 ENCOUNTER — Ambulatory Visit
Admission: RE | Admit: 2011-12-08 | Discharge: 2011-12-08 | Disposition: A | Discharge status: Home / Self Care | Payer: Medicaid Other | Source: Ambulatory Visit | Attending: Radiation Oncology | Admitting: Radiation Oncology

## 2011-12-08 ENCOUNTER — Ambulatory Visit: Payer: Medicaid Other

## 2011-12-09 ENCOUNTER — Other Ambulatory Visit (HOSPITAL_BASED_OUTPATIENT_CLINIC_OR_DEPARTMENT_OTHER): Payer: Self-pay

## 2011-12-09 ENCOUNTER — Ambulatory Visit: Payer: Medicaid Other

## 2011-12-09 ENCOUNTER — Encounter: Payer: Self-pay | Admitting: *Deleted

## 2011-12-09 ENCOUNTER — Ambulatory Visit
Admission: RE | Admit: 2011-12-09 | Discharge: 2011-12-09 | Disposition: A | Discharge status: Home / Self Care | Payer: Medicaid Other | Source: Ambulatory Visit | Attending: Radiation Oncology | Admitting: Radiation Oncology

## 2011-12-09 DIAGNOSIS — C029 Malignant neoplasm of tongue, unspecified: Secondary | ICD-10-CM

## 2011-12-09 LAB — COMPREHENSIVE METABOLIC PANEL
ALT: 40 U/L (ref 0–53)
Albumin: 4.2 g/dL (ref 3.5–5.2)
CO2: 30 mEq/L (ref 19–32)
Calcium: 9.4 mg/dL (ref 8.4–10.5)
Chloride: 99 mEq/L (ref 96–112)
Creatinine, Ser: 0.96 mg/dL (ref 0.50–1.35)
Total Protein: 6.9 g/dL (ref 6.0–8.3)

## 2011-12-09 LAB — CBC WITH DIFFERENTIAL/PLATELET
Eosinophils Absolute: 0.2 10*3/uL (ref 0.0–0.5)
HCT: 32.7 % — ABNORMAL LOW (ref 38.4–49.9)
LYMPH%: 12.3 % — ABNORMAL LOW (ref 14.0–49.0)
MONO#: 0.5 10*3/uL (ref 0.1–0.9)
NEUT#: 3.2 10*3/uL (ref 1.5–6.5)
Platelets: 199 10*3/uL (ref 140–400)
RBC: 3.58 10*6/uL — ABNORMAL LOW (ref 4.20–5.82)
WBC: 4.4 10*3/uL (ref 4.0–10.3)
nRBC: 0 % (ref 0–0)

## 2011-12-09 NOTE — Progress Notes (Signed)
[facility] Psychosocial Distress Screening Clinical Social Work  Clinical Social Work was referred by distress screening protocol.  The patient scored a 8 on the Psychosocial Distress Thermometer which indicates severe distress. Clinical Social Worker contacted patient to assess for distress and other psychosocial needs. The patient states he is having some side effects from the chemo and radiation including fatigue, nausea, having trouble swallowing.  He indicates that he is "doing okay". CSW encouraged patient to request to speak with CSW at next chemo appointment if he would like to talk further. Patient verbalized understanding.   Clinical Social Worker follow up needed: no  If yes, follow up plan:  Kathrin Penner, MSW, [facility] Clinical Social Worker [facility] [phone]

## 2011-12-12 ENCOUNTER — Encounter: Payer: Self-pay | Admitting: Radiation Oncology

## 2011-12-12 ENCOUNTER — Ambulatory Visit
Admission: RE | Admit: 2011-12-12 | Discharge: 2011-12-12 | Disposition: A | Discharge status: Home / Self Care | Payer: Medicaid Other | Source: Ambulatory Visit | Attending: Radiation Oncology | Admitting: Radiation Oncology

## 2011-12-12 ENCOUNTER — Other Ambulatory Visit: Payer: Self-pay | Admitting: Certified Registered Nurse Anesthetist

## 2011-12-12 ENCOUNTER — Encounter: Payer: Self-pay | Admitting: Oncology

## 2011-12-12 ENCOUNTER — Ambulatory Visit: Payer: Medicaid Other

## 2011-12-12 ENCOUNTER — Ambulatory Visit (HOSPITAL_BASED_OUTPATIENT_CLINIC_OR_DEPARTMENT_OTHER): Payer: Self-pay | Admitting: Oncology

## 2011-12-12 ENCOUNTER — Telehealth: Payer: Self-pay | Admitting: Oncology

## 2011-12-12 VITALS — BP 136/87 | HR 81 | Resp 18 | Wt 175.7 lb

## 2011-12-12 VITALS — BP 123/78 | HR 80 | Temp 97.4°F | Ht 72.0 in | Wt 175.5 lb

## 2011-12-12 DIAGNOSIS — C029 Malignant neoplasm of tongue, unspecified: Secondary | ICD-10-CM

## 2011-12-12 DIAGNOSIS — C770 Secondary and unspecified malignant neoplasm of lymph nodes of head, face and neck: Secondary | ICD-10-CM

## 2011-12-12 MED ORDER — FENTANYL 12 MCG/HR TD PT72: 1.0000 | MEDICATED_PATCH | TRANSDERMAL | Status: DC

## 2011-12-12 MED ORDER — HYDROCODONE-ACETAMINOPHEN 7.5-500 MG/15ML PO SOLN: 15.0000 mL | Freq: Four times a day (QID) | ORAL | Status: DC | PRN

## 2011-12-12 NOTE — Patient Instructions (Signed)
Lab appt on 4/5 and 4/12.  Follow-up visit on 4/8 & 4/12. Chemotherapy scheduled for 12/26/11.

## 2011-12-12 NOTE — Telephone Encounter (Signed)
gve the pt his April 2013 appt calendar 

## 2011-12-12 NOTE — Progress Notes (Signed)
Encounter addended by: Lonie Peak, MD on: 12/12/2011  1:13 PM<BR>     Documentation filed: Notes Section

## 2011-12-12 NOTE — Progress Notes (Addendum)
   Weekly Management Note, oral tongue cancer Current Dose:   4000 cGy  Projected Dose:  6600 cGy   Narrative:  The patient presents for routine under treatment assessment.  CBCT/MVCT images/Port film x-rays were reviewed.  The chart was checked. He is doing well overall. He has noticed a sore on his inner lip. It is not painful as his lip has been numb. His sense of taste is very unpleasant. He is using his PEG tube.  Physical Findings: Weight: 175 lb 11.2 oz (79.697 kg).  Filed Vitals:   12/12/11 1159  BP: 136/87  Pulse: 81  Resp: 18  Weight: 175 lb 11.2 oz (79.697 kg)    6 pound weight loss noted since 12/05/2011. He has a few patches of mucositis on the inner lip; no thrush. Neck and face is erythematous, skin somewhat dry. Skin graft is appreciated over the neck.  CBC    Component Value Date/Time   WBC 4.4 12/09/2011 0917   WBC 6.9 07/26/2011 0951   RBC 3.58* 12/09/2011 0917   RBC 3.94* 07/26/2011 0951   HGB 10.9* 12/09/2011 0917   HGB 12.5* 07/26/2011 0951   HCT 32.7* 12/09/2011 0917   HCT 37.3* 07/26/2011 0951   PLT 199 12/09/2011 0917   PLT 212 07/26/2011 0951   MCV 91.3 12/09/2011 0917   MCV 94.7 07/26/2011 0951   MCH 30.4 12/09/2011 0917   MCH 31.7 07/26/2011 0951   MCHC 33.3 12/09/2011 0917   MCHC 33.5 07/26/2011 0951   RDW 14.7* 12/09/2011 0917   RDW 12.1 07/26/2011 0951   LYMPHSABS 0.5* 12/09/2011 0917   MONOABS 0.5 12/09/2011 0917   MONOABS 0.6 02/14/2008 0803   EOSABS 0.2 12/09/2011 0917   EOSABS 0.2 02/14/2008 0803   BASOSABS 0.0 12/09/2011 0917   BASOSABS 0.1 02/14/2008 0803    CMP     Component Value Date/Time   NA 137 12/09/2011 0917   K 3.7 12/09/2011 0917   CL 99 12/09/2011 0917   CO2 30 12/09/2011 0917   GLUCOSE 113* 12/09/2011 0917   BUN 16 12/09/2011 0917   CREATININE 0.96 12/09/2011 0917   CALCIUM 9.4 12/09/2011 0917   PROT 6.9 12/09/2011 0917   ALBUMIN 4.2 12/09/2011 0917   AST 37 12/09/2011 0917   ALT 40 12/09/2011 0917   ALKPHOS 62 12/09/2011 0917   BILITOT 0.3 12/09/2011 0917   GFRNONAA >90 07/26/2011 0951   GFRAA >90 07/26/2011 0951     Impression:  The patient is tolerating radiotherapy.  Plan:  Continue radiotherapy as planned. I will cc this note to Zenovia Jarred of nutrition to make sure that she is aware of his recent weight loss.

## 2011-12-12 NOTE — Progress Notes (Signed)
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  He developed recurrence in the left submandibular gland in August 2012.  He underwent on 07/27/2011 radical left neck dissection by Dr. Ezzard Standing and Dr. Geryl Rankins. Pathology case number [id number] showed total 15 lymph nodes from left radical neck dissection that was all negative. However the left submandibular gland mass showed a 3.5 cm invasive SCC, moderately differentiates involving the adjacent skeletal muscle tissue and bone a tissue and extending into the inked margin. There was no evidence of any lymphatic invasion or perineural invasion.  He underwent further resection by Dr. Mary Sella at [facility] on 09/29/2011.  I'm awaiting path from that resection.   CURRENT THERAPY:  Started adjuvant chemoradiation with q3wk Cisplatin on 11/14/11 and daily radiation.   INTERIM HISTORY:  Thomas Bean returns to clinic today for regular follow-up mid cycle. His father is with is today. He continues to have tinnitus with problem with high pitch voice and music that sounds scratchy. Unchanged from previous reports.  He denies hearing loss with normal conversation level.  He had nausea for about 5-6 days after his second cycle of chemotherapy. No vomiting. Nausea has now resolved. Notices and increase in mouth pain with a sore throat. He is doing mouth rinses 3-4x/day.  He thinks that he is taking in about 1.5L fluid daily. Now taking 100% of nutrition through his PEG tube. Putting Ensure through his PEG tube. Last week he only took in 4 cans/day due to nausea, but plans to increase to 6 cans per day now that nausea is improved. He has had weight los over the past week . He is seen by Dietician. He  denies PEG tube pain, erythema, purulent discharge.  He has mild fatigue; however, he is still independent of daily livings.  He lives by himself and takes care of his own driving, grocery shopping.  He still has some clear discharge from the left neck at the site of graft.  He takes some Lortab elixir about 2-3x/day. Oxycontin prescribed by Radiation Oncology. He states that he was started on Diflucan last week for oral candidiasis.    Patient denies headache, visual changes, confusion, drenching night sweats, palpable lymph node swelling, dysphagia, vomiting, jaundice, chest pain, palpitation, shortness of breath, dyspnea on exertion, productive cough, gum bleeding, epistaxis, hematemesis, hemoptysis, abdominal pain, abdominal swelling, early satiety, melena, hematochezia, hematuria, skin rash, spontaneous bleeding, joint swelling, joint pain, heat or cold intolerance, bowel bladder incontinence, back pain, focal motor weakness, paresthesia, depression, suicidal or homocidal ideation, feeling hopelessness.    MEDICAL HISTORY: Past Medical History  Diagnosis Date  . Hemorrhoids   . PONV (postoperative nausea and vomiting)   . Hypertension     no medications at present  . GERD (gastroesophageal reflux disease)     no medication  . Depression     treated approx 12 years ago, no treatment at this time  . Hiatal hernia   . Cancer     left side of tongue T1 lesion excised 4/12    SURGICAL HISTORY:  Past Surgical History  Procedure Date  . Inguinal herniorrhapy   . Tonsillectomy and adenoidectomy   . Excision of tongue mass     April 2012  .  Hernia repair   . Neck mass excision 07/27/2011  . Radical neck dissection 07/27/2011    Procedure: RADICAL NECK DISSECTION;  Surgeon: Drema Halon, MD;  Location: [facility] OR;  Service: ENT;  Laterality: Left;  Marland Kitchen Mandibulectomy 09/2011    and scapular free flap    MEDICATIONS: Current Outpatient Prescriptions  Medication Sig Dispense Refill    . alum & mag hydroxide-simeth (MAALOX/MYLANTA) 200-200-20 MG/5ML suspension Take 15 mLs by mouth as needed.      . chlorhexidine (PERIDEX) 0.12 % solution Use as directed 15 mLs in the mouth or throat 2 (two) times daily.        Marland Kitchen emollient (BIAFINE) cream Apply topically daily.      . fluconazole (DIFLUCAN) 100 MG tablet Take two by mouth on day one, then one by mouth daily until finished.  11 tablet  0  . HYDROcodone-acetaminophen (LORTAB) 7.5-500 MG/15ML solution Take 15 mLs by mouth every 6 (six) hours as needed for pain.  480 mL  0  . ibuprofen (ADVIL,MOTRIN) 200 MG tablet Take 400 mg by mouth every 6 (six) hours as needed. Pain       . loratadine (CLARITIN) 10 MG tablet Take 10 mg by mouth daily as needed. FOR ALLERGIES       . LORazepam (ATIVAN) 0.5 MG tablet Take 1 tablet (0.5 mg total) by mouth every 6 (six) hours as needed (Nausea or vomiting).  90 tablet  0  . ondansetron (ZOFRAN) 8 MG tablet Take 1 tablet two times a day as needed for nausea or vomiting starting on the third day after chemotherapy.  30 tablet  1  . oxyCODONE (OXYCONTIN) 10 MG 12 hr tablet Take 1 tablet (10 mg total) by mouth every 12 (twelve) hours.  60 tablet  0  . prochlorperazine (COMPAZINE) 10 MG tablet Take 1 tablet (10 mg total) by mouth every 6 (six) hours as needed (Nausea or vomiting).  30 tablet  1    ALLERGIES:  is allergic to codeine phosphate and tape.  REVIEW OF SYSTEMS:  The rest of the 14-point review of system was negative.   Filed Vitals:   12/12/11 1109  BP: 123/78  Pulse: 80  Temp: 97.4 F (36.3 C)   Wt Readings from Last 3 Encounters:  12/12/11 175 lb 8 oz (79.606 kg)  12/12/11 175 lb 11.2 oz (79.697 kg)  12/05/11 181 lb 6.4 oz (82.283 kg)   ECOG Performance status: 1  PHYSICAL EXAMINATION:   General:  well-nourished in no acute distress.  Eyes:  no scleral icterus.  ENT:  There was filing defect in left mandible.  External flap in left mandible from left axilla. The flap is now  less edematous, without purulent discharge, erythema, or pain on palpation.  Trach stoma is now closed with no purulent discharge.  Lymphatics:  Negative cervical, supraclavicular or axillary adenopathy.  Respiratory: lungs were clear bilaterally without wheezing or crackles.  Cardiovascular:  Regular rate and rhythm, S1/S2, without murmur, rub or gallop.  There was no pedal edema.  GI:  abdomen was soft, flat, nontender, nondistended, without organomegaly. PEG tube dry/clean/intact.  Muscoloskeletal:  no spinal tenderness of palpation of vertebral spine.  Skin exam was without echymosis, petichae.  Left axilla site where the flap was harvested with no erythema, purulent discharge or pain on palpation.    Patient was able to get on and off exam table without assistance.  Gait was normal.  Patient was alerted and oriented.  Attention was good.  Language was appropriate.  Mood was normal without depression.  Speech was not pressured.  Thought content was not tangential.     LABORATORY/RADIOLOGY DATA:  Lab Results  Component Value Date   WBC 4.4 12/09/2011   HGB 10.9* 12/09/2011   HCT 32.7* 12/09/2011   PLT 199 12/09/2011   GLUCOSE 113* 12/09/2011   CHOL 160 02/14/2008   TRIG 442* 02/14/2008   HDL 26.5* 02/14/2008   LDLDIRECT 64.8 02/14/2008   ALT 40 12/09/2011   AST 37 12/09/2011   NA 137 12/09/2011   K 3.7 12/09/2011   CL 99 12/09/2011   CREATININE 0.96 12/09/2011   BUN 16 12/09/2011   CO2 30 12/09/2011   TSH 2.38 02/14/2008     ASSESSMENT AND PLAN:   1. Smoking: none at this time.  2. Recurrent left lateral tongue squamous cell carcinoma: He is doing well with adjuvant chemoradiation.  Next cycle of chemo is due 12/26/11. Plan to see Thomas Bean on 12/19/11 with labs on 12/16/11. Will be seen by Dr Gaylyn Rong on 12/23/11 with labs prior to his chemotherapy.  3.  Calorie/Protein malnutrition: Weight has decreased. Now taking 100% of nutrition through PEG due to mouth pain. Had nausea last week following chemotherapy, but  now resolved. Plans to increase amount of Ensure to 6 cans/day via PEG. Follow-up with Dietician as scheduled.   4.  Mucositis prevention:  Salt/baking soda mouth rinses and Biotene 4-6x/day. He was encouraged to continue aggressive mouth rinses.   5.  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  If problem persists or worsens, we may need consider weekly Cisplatin.   6.  Nausea/vomiting:  He has Compazine/Zofran/Ativan prn.   7.  Mouth pain from resection and ongoing chemoradiation: Continue PRN Lortab elixir. This was refilled for him today. Oxycontin added per Radiation Oncology. Will monitor pain. May need to change to Fentanyl patch is unable to swallow Oxycontin tablets.   8.  Follow up:  Plan for patient to have follow-up labs on 12/16/11 and 12/23/11. He will be seen on 12/19/11 and 12/23/11. Third cycle of chemotherapy is currently scheduled for 12/26/11.  The patient was seen and examined with Dr Gaylyn Rong. >90% of plan of care developed by dr Gaylyn Rong. The length of time of the face-to-face encounter was 20 minutes. More than 50% of time was spent counseling and coordination of care.

## 2011-12-12 NOTE — Progress Notes (Signed)
Patient presents to the clinic today accompanied by his father for an under treat visit with Dr. Basilio Cairo. Patient is alert and oriented to person, place, and time. No distress noted. Steady gait noted. Pleasant affect noted. Patient report mouth pain 3 on a scale of 0-10. Hyperpigmentation and dryness of the treated area noted. Patient reports using Biafine as directed. Patient reports thick saliva is better because he is switching back and forth between baking soda, salt and biotene rinese. Ulceration of lower lip noted. Patient reports numbness of lower left jaw. Patient reports soreness/pain of mouth and throat relieve with prescribed pain medication and diflucan. Patient reports that he primarily relies on his PEG tube for nourishment because food "even water taste horrible. Six pound weight loss noted since 12/05/2011. Reported all findings to Dr. Basilio Cairo.

## 2011-12-13 ENCOUNTER — Ambulatory Visit: Payer: Medicaid Other

## 2011-12-13 ENCOUNTER — Ambulatory Visit
Admission: RE | Admit: 2011-12-13 | Discharge: 2011-12-13 | Disposition: A | Discharge status: Home / Self Care | Payer: Medicaid Other | Source: Ambulatory Visit | Attending: Radiation Oncology | Admitting: Radiation Oncology

## 2011-12-14 ENCOUNTER — Ambulatory Visit: Payer: Medicaid Other

## 2011-12-14 ENCOUNTER — Ambulatory Visit
Admission: RE | Admit: 2011-12-14 | Discharge: 2011-12-14 | Disposition: A | Discharge status: Home / Self Care | Payer: Medicaid Other | Source: Ambulatory Visit | Attending: Radiation Oncology | Admitting: Radiation Oncology

## 2011-12-15 ENCOUNTER — Ambulatory Visit: Payer: Medicaid Other

## 2011-12-15 ENCOUNTER — Ambulatory Visit
Admission: RE | Admit: 2011-12-15 | Discharge: 2011-12-15 | Disposition: A | Discharge status: Home / Self Care | Payer: Medicaid Other | Source: Ambulatory Visit | Attending: Radiation Oncology | Admitting: Radiation Oncology

## 2011-12-16 ENCOUNTER — Other Ambulatory Visit (HOSPITAL_BASED_OUTPATIENT_CLINIC_OR_DEPARTMENT_OTHER): Payer: Self-pay | Admitting: Lab

## 2011-12-16 ENCOUNTER — Ambulatory Visit: Payer: Medicaid Other

## 2011-12-16 ENCOUNTER — Ambulatory Visit
Admission: RE | Admit: 2011-12-16 | Discharge: 2011-12-16 | Disposition: A | Discharge status: Home / Self Care | Payer: Medicaid Other | Source: Ambulatory Visit | Attending: Radiation Oncology | Admitting: Radiation Oncology

## 2011-12-16 DIAGNOSIS — C029 Malignant neoplasm of tongue, unspecified: Secondary | ICD-10-CM

## 2011-12-16 LAB — CBC WITH DIFFERENTIAL/PLATELET
BASO%: 0.7 % (ref 0.0–2.0)
HCT: 30.6 % — ABNORMAL LOW (ref 38.4–49.9)
MCHC: 33 g/dL (ref 32.0–36.0)
MONO#: 0.3 10*3/uL (ref 0.1–0.9)
RBC: 3.43 10*6/uL — ABNORMAL LOW (ref 4.20–5.82)
RDW: 13.5 % (ref 11.0–14.6)
WBC: 4.5 10*3/uL (ref 4.0–10.3)
lymph#: 0.5 10*3/uL — ABNORMAL LOW (ref 0.9–3.3)
nRBC: 0 % (ref 0–0)

## 2011-12-16 LAB — COMPREHENSIVE METABOLIC PANEL
ALT: 11 U/L (ref 0–53)
AST: 15 U/L (ref 0–37)
Albumin: 3.9 g/dL (ref 3.5–5.2)
Calcium: 9.5 mg/dL (ref 8.4–10.5)
Chloride: 98 mEq/L (ref 96–112)
Potassium: 4 mEq/L (ref 3.5–5.3)
Sodium: 138 mEq/L (ref 135–145)

## 2011-12-19 ENCOUNTER — Ambulatory Visit: Payer: Medicaid Other

## 2011-12-19 ENCOUNTER — Ambulatory Visit
Admission: RE | Admit: 2011-12-19 | Discharge: 2011-12-19 | Disposition: A | Discharge status: Home / Self Care | Payer: Medicaid Other | Source: Ambulatory Visit | Attending: Radiation Oncology | Admitting: Radiation Oncology

## 2011-12-19 ENCOUNTER — Encounter: Payer: Self-pay | Admitting: Oncology

## 2011-12-19 ENCOUNTER — Ambulatory Visit: Payer: Medicaid Other | Attending: Oncology

## 2011-12-19 ENCOUNTER — Encounter: Payer: Self-pay | Admitting: Radiation Oncology

## 2011-12-19 ENCOUNTER — Ambulatory Visit (HOSPITAL_BASED_OUTPATIENT_CLINIC_OR_DEPARTMENT_OTHER): Payer: Self-pay | Admitting: Oncology

## 2011-12-19 VITALS — BP 126/81 | HR 79 | Resp 18 | Wt 175.3 lb

## 2011-12-19 VITALS — BP 121/80 | HR 81 | Temp 98.1°F | Ht 72.0 in | Wt 175.1 lb

## 2011-12-19 DIAGNOSIS — E46 Unspecified protein-calorie malnutrition: Secondary | ICD-10-CM

## 2011-12-19 DIAGNOSIS — H9319 Tinnitus, unspecified ear: Secondary | ICD-10-CM

## 2011-12-19 DIAGNOSIS — C029 Malignant neoplasm of tongue, unspecified: Secondary | ICD-10-CM

## 2011-12-19 DIAGNOSIS — R1311 Dysphagia, oral phase: Secondary | ICD-10-CM | POA: Insufficient documentation

## 2011-12-19 DIAGNOSIS — R112 Nausea with vomiting, unspecified: Secondary | ICD-10-CM

## 2011-12-19 DIAGNOSIS — C770 Secondary and unspecified malignant neoplasm of lymph nodes of head, face and neck: Secondary | ICD-10-CM

## 2011-12-19 DIAGNOSIS — IMO0001 Reserved for inherently not codable concepts without codable children: Secondary | ICD-10-CM | POA: Insufficient documentation

## 2011-12-19 MED ORDER — BIAFINE EX EMUL
Freq: Every day | CUTANEOUS | Status: DC
  Administered 2011-12-19: 11:00:00 via TOPICAL

## 2011-12-19 NOTE — Progress Notes (Signed)
Weekly Management Note:  Site:L Neck/oral cavity Current Dose:  5000  cGy Projected Dose: 6600  cGy  Narrative: The patient is seen today for routine under treatment assessment. CBCT/MVCT images/port films were reviewed. The chart was reviewed.   He is still doing well. He did chemotherapy 2 weeks ago and he is scheduled for more chemotherapy later next week. His CBC from April 5 is satisfactory. He is maintaining his weight through his PEG tube. He is taking in 5-6 cans of Ensure a day. He does have thickened saliva, particularly the morning. He is using saline mouth rinses.  Physical Examination:  Filed Vitals:   12/19/11 1039  BP: 126/81  Pulse: 79  Resp: 18  .  Weight: 175 lb 4.8 oz (79.516 kg). There is marked erythema the skin along his left face and neck as expected. Oral cavity: There is a confluent mucositis along his left oral cavity. No evidence of candidiasis.  Impression: Tolerating radiation therapy well.  Plan: Continue radiation therapy as planned.

## 2011-12-19 NOTE — Progress Notes (Signed)
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  He developed recurrence in the left submandibular gland in August 2012.  He underwent on 07/27/2011 radical left neck dissection by Dr. Ezzard Standing and Dr. Geryl Rankins. Pathology case number [id number] showed total 15 lymph nodes from left radical neck dissection that was all negative. However the left submandibular gland mass showed a 3.5 cm invasive SCC, moderately differentiates involving the adjacent skeletal muscle tissue and bone a tissue and extending into the inked margin. There was no evidence of any lymphatic invasion or perineural invasion.  He underwent further resection by Dr. Mary Sella at [facility] on 09/29/2011.  I'm awaiting path from that resection.   CURRENT THERAPY:  Started adjuvant chemoradiation with q3wk Cisplatin on 11/14/11 and daily radiation.   INTERIM HISTORY:  Thomas Bean returns to clinic today for regular follow-up mid cycle. His father is with is today. He continues to have tinnitus with problem with high pitch voice and music that sounds scratchy. Unchanged from previous reports.  He denies hearing loss with normal conversation level. No recent nausea or vomiting. Notices and increase in mouth pain with a sore throat. He is doing salt/baking soda mouth rinses 3-4x/day. Taking 100% of nutrition through his PEG tube. Putting Ensure through his PEG tube 5-6 cans/day. Weight is stable. He denies PEG tube pain, erythema, purulent discharge.  He has mild fatigue; however, he is still independent in activities of daily living.  He lives by himself and takes care of his own driving, grocery shopping. He takes Lortab elixir about 2-3x/day. He has Oxycontin prescribed by  Radiation Oncology, but says that he is not taking this routinely.   Patient denies headache, visual changes, confusion, drenching night sweats, palpable lymph node swelling, dysphagia, vomiting, jaundice, chest pain, palpitation, shortness of breath, dyspnea on exertion, productive cough, gum bleeding, epistaxis, hematemesis, hemoptysis, abdominal pain, abdominal swelling, early satiety, melena, hematochezia, hematuria, skin rash, spontaneous bleeding, joint swelling, joint pain, heat or cold intolerance, bowel bladder incontinence, back pain, focal motor weakness, paresthesia, depression, suicidal or homocidal ideation, feeling hopelessness.    MEDICAL HISTORY: Past Medical History  Diagnosis Date  . Hemorrhoids   . PONV (postoperative nausea and vomiting)   . Hypertension     no medications at present  . GERD (gastroesophageal reflux disease)     no medication  . Depression     treated approx 12 years ago, no treatment at this time  . Hiatal hernia   . Cancer     left side of tongue T1 lesion excised 4/12    SURGICAL HISTORY:  Past Surgical History  Procedure Date  . Inguinal herniorrhapy   . Tonsillectomy and adenoidectomy   . Excision of tongue mass     April 2012  . Hernia repair   . Neck mass excision 07/27/2011  . Radical neck dissection 07/27/2011    Procedure: RADICAL NECK DISSECTION;  Surgeon: Drema Halon, MD;  Location: [facility] OR;  Service: ENT;  Laterality: Left;  Marland Kitchen Mandibulectomy 09/2011    and scapular free flap    MEDICATIONS: Current Outpatient Prescriptions  Medication Sig Dispense Refill  . alum & mag hydroxide-simeth (MAALOX/MYLANTA) 200-200-20 MG/5ML suspension Take 15 mLs by mouth as needed.      Marland Kitchen  emollient (BIAFINE) cream Apply topically daily.      Marland Kitchen HYDROcodone-acetaminophen (LORTAB) 7.5-500 MG/15ML solution Take 15 mLs by mouth every 6 (six) hours as needed for pain.  480 mL  0  . ibuprofen (ADVIL,MOTRIN) 200 MG tablet Take 400 mg by mouth  every 6 (six) hours as needed. Pain       . loratadine (CLARITIN) 10 MG tablet Take 10 mg by mouth daily as needed. FOR ALLERGIES       . LORazepam (ATIVAN) 0.5 MG tablet Take 1 tablet (0.5 mg total) by mouth every 6 (six) hours as needed (Nausea or vomiting).  90 tablet  0  . ondansetron (ZOFRAN) 8 MG tablet Take 1 tablet two times a day as needed for nausea or vomiting starting on the third day after chemotherapy.  30 tablet  1  . oxyCODONE (OXYCONTIN) 10 MG 12 hr tablet Take 1 tablet (10 mg total) by mouth every 12 (twelve) hours.  60 tablet  0  . prochlorperazine (COMPAZINE) 10 MG tablet Take 1 tablet (10 mg total) by mouth every 6 (six) hours as needed (Nausea or vomiting).  30 tablet  1  . chlorhexidine (PERIDEX) 0.12 % solution Use as directed 15 mLs in the mouth or throat 2 (two) times daily.         No current facility-administered medications for this visit.   Facility-Administered Medications Ordered in Other Visits  Medication Dose Route Frequency Provider Last Rate Last Dose  . topical emolient (BIAFINE) emulsion   Topical Daily Maryln Gottron, MD        ALLERGIES:  is allergic to codeine phosphate and tape.  REVIEW OF SYSTEMS:  The rest of the 14-point review of system was negative.   Filed Vitals:   12/19/11 1110  BP: 121/80  Pulse: 81  Temp: 98.1 F (36.7 C)   Wt Readings from Last 3 Encounters:  12/19/11 175 lb 1.6 oz (79.425 kg)  12/19/11 175 lb 4.8 oz (79.516 kg)  12/12/11 175 lb 8 oz (79.606 kg)   ECOG Performance status: 0-1  PHYSICAL EXAMINATION:   General:  well-nourished in no acute distress.  Eyes:  no scleral icterus.  ENT:  There was filing defect in left mandible.  External flap in left mandible from left axilla. The flap is now less edematous, without purulent discharge, erythema, or pain on palpation.  Trach stoma is now closed with no purulent discharge.  Lymphatics:  Negative cervical, supraclavicular or axillary adenopathy.  Respiratory: lungs  were clear bilaterally without wheezing or crackles.  Cardiovascular:  Regular rate and rhythm, S1/S2, without murmur, rub or gallop.  There was no pedal edema.  GI:  abdomen was soft, flat, nontender, nondistended, without organomegaly. PEG tube dry/clean/intact.  Muscoloskeletal:  no spinal tenderness of palpation of vertebral spine.  Skin exam was without echymosis, petichae.  Left axilla site where the flap was harvested with no erythema, purulent discharge or pain on palpation.    Patient was able to get on and off exam table without assistance.  Gait was normal.  Patient was alerted and oriented.  Attention was good.   Language was appropriate.  Mood was normal without depression.  Speech was not pressured.  Thought content was not tangential.     LABORATORY/RADIOLOGY DATA:  Lab Results  Component Value Date   WBC 4.5 12/16/2011   HGB 10.1* 12/16/2011   HCT 30.6* 12/16/2011   PLT 137* 12/16/2011   GLUCOSE 124* 12/16/2011   CHOL 160 02/14/2008  TRIG 442* 02/14/2008   HDL 26.5* 02/14/2008   LDLDIRECT 64.8 02/14/2008   ALT 11 12/16/2011   AST 15 12/16/2011   NA 138 12/16/2011   K 4.0 12/16/2011   CL 98 12/16/2011   CREATININE 0.86 12/16/2011   BUN 14 12/16/2011   CO2 30 12/16/2011   TSH 2.38 02/14/2008     ASSESSMENT AND PLAN:   1. Smoking: none at this time.  2. Recurrent left lateral tongue squamous cell carcinoma: He is doing well with adjuvant chemoradiation.  Next cycle of chemo is due 12/26/11. Plan to see Thomas Bean on 12/23/11 with labs prior to his chemotherapy. Chemotherapy is scheduled for 12/26/11.  3.  Calorie/Protein malnutrition: Weight stable. Now taking 100% of nutrition through PEG due to mouth pain. Had nausea last week following chemotherapy, but now resolved. Plans to increase amount of Ensure to 6 cans/day via PEG. Follow-up with Dietician as scheduled. Performing swallow exercises and due to follow-up with ST later today.  4.  Mucositis prevention:  Salt/baking soda mouth rinses and Biotene  4-6x/day. He was encouraged to continue aggressive mouth rinses. Encouraged to use H2O2 to thin mucous in mouth.  5.  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  Remains stable.   6.  Nausea/vomiting:  He has Compazine/Zofran/Ativan prn.   7.  Mouth pain from resection and ongoing chemoradiation: Continue PRN Lortab elixir. This was refilled for him today. Oxycontin added per Radiation Oncology. Will monitor pain. May need to change to Fentanyl patch is unable to swallow Oxycontin tablets, but no difficulty taking pills at this time.   8.  Follow up:  Plan for patient to have follow-up labs on 12/23/11 and return visit on 12/23/11. Third cycle of chemotherapy is currently scheduled for 12/26/11.  The patient was seen and examined with Dr Gaylyn Rong. >90% of plan of care developed by dr Gaylyn Rong. The length of time of the face-to-face encounter was 20 minutes. More than 50% of time was spent counseling and coordination of care.

## 2011-12-19 NOTE — Progress Notes (Signed)
Patient presents to the clinic today accompanied by his father for an under treat visit with Dr. Dayton Scrape. Patient is alert and oriented to person, place, and time. No distress noted. Steady gait noted. Pleasant affect noted. Patient denies pain at this time. Patient reports thick saliva is mostly management with the exception of the morning. Patient even reports occasional dry mouth. Patient reports the only thing he is taking in by mouth is his medication. Patient reports he is doing his swallowing exercises as directed. Patient reports taking in 5-6 cans of ensure per day. Patient reports nausea related to effect of chemo has last longer than normal this time. Patient denies diarrhea. Ulcer on the left side of patient's bottom lip smaller. Patient reports the left side of his bottom lip continues to be numb. Weight stable. Reported all findings to Dr. Dayton Scrape. Provided patient with additional tube of Biafine.

## 2011-12-19 NOTE — Progress Notes (Signed)
Encounter addended by: Agnes Lawrence, RN on: 12/19/2011 11:08 AM<BR>     Documentation filed: Inpatient MAR

## 2011-12-20 ENCOUNTER — Ambulatory Visit
Admission: RE | Admit: 2011-12-20 | Discharge: 2011-12-20 | Disposition: A | Discharge status: Home / Self Care | Payer: Medicaid Other | Source: Ambulatory Visit | Attending: Radiation Oncology | Admitting: Radiation Oncology

## 2011-12-20 ENCOUNTER — Ambulatory Visit: Payer: Medicaid Other

## 2011-12-21 ENCOUNTER — Ambulatory Visit
Admission: RE | Admit: 2011-12-21 | Discharge: 2011-12-21 | Disposition: A | Discharge status: Home / Self Care | Payer: Medicaid Other | Source: Ambulatory Visit | Attending: Radiation Oncology | Admitting: Radiation Oncology

## 2011-12-21 ENCOUNTER — Ambulatory Visit: Payer: Medicaid Other

## 2011-12-22 ENCOUNTER — Ambulatory Visit
Admission: RE | Admit: 2011-12-22 | Discharge: 2011-12-22 | Disposition: A | Discharge status: Home / Self Care | Payer: Medicaid Other | Source: Ambulatory Visit | Attending: Radiation Oncology | Admitting: Radiation Oncology

## 2011-12-22 ENCOUNTER — Other Ambulatory Visit: Payer: Self-pay | Admitting: Radiation Oncology

## 2011-12-22 ENCOUNTER — Ambulatory Visit: Payer: Medicaid Other

## 2011-12-22 VITALS — BP 125/84 | HR 98 | Temp 98.2°F | Wt 175.2 lb

## 2011-12-22 DIAGNOSIS — C029 Malignant neoplasm of tongue, unspecified: Secondary | ICD-10-CM

## 2011-12-22 MED ORDER — HYDROCODONE-ACETAMINOPHEN 7.5-500 MG/15ML PO SOLN: 15.0000 mL | Freq: Four times a day (QID) | ORAL | Status: AC | PRN

## 2011-12-22 NOTE — Progress Notes (Signed)
Here for routine weekly md visit for head/neck ca.Has completed 28 of 33 treatments. Skin hyperpigmented applying biafine 3  To 4 times daily. Pain controlled no on oxycontin. Score "4".Reinforced need to take oxycontin as a scheduled medication. Needs refill on lortab elixir. Occasional nausea revolved with lorazepam copius oral secretions on arising in the morning. There are some visible white patches in mouth. Performs frequent oral care with biotene.Marland Kitchen

## 2011-12-22 NOTE — Progress Notes (Signed)
Weekly Management Note:  Site:L neck/oral cavity Current Dose:  5600  cGy Projected Dose: 6600  cGy  Narrative: The patient is seen today for routine under treatment assessment. CBCT/MVCT images/port films were reviewed. The chart was reviewed.   He does report more throat discomfort and started on OxyContin 10 mg by mouth twice a day this past Monday. He is out of his Lortab elixir and wants a refill.  Physical Examination:  Filed Vitals:   12/22/11 1057  BP: 125/84  Pulse: 98  Temp: 98.2 F (36.8 C)  .  Weight: 175 lb 3.2 oz (79.47 kg). Marked erythema along left neck with patchy dry desquamation. Oral cavity reveals xerostomia with no candidiasis.  Impression: Tolerating radiation therapy well.  Plan: Continue radiation therapy as planned. Lortab elixir is refill today.

## 2011-12-23 ENCOUNTER — Telehealth: Payer: Self-pay | Admitting: Oncology

## 2011-12-23 ENCOUNTER — Ambulatory Visit: Payer: Medicaid Other

## 2011-12-23 ENCOUNTER — Ambulatory Visit (HOSPITAL_BASED_OUTPATIENT_CLINIC_OR_DEPARTMENT_OTHER): Payer: Self-pay | Admitting: Oncology

## 2011-12-23 ENCOUNTER — Encounter: Payer: Self-pay | Admitting: Oncology

## 2011-12-23 ENCOUNTER — Other Ambulatory Visit (HOSPITAL_BASED_OUTPATIENT_CLINIC_OR_DEPARTMENT_OTHER): Payer: Self-pay | Admitting: Lab

## 2011-12-23 ENCOUNTER — Ambulatory Visit
Admission: RE | Admit: 2011-12-23 | Discharge: 2011-12-23 | Disposition: A | Discharge status: Home / Self Care | Payer: Medicaid Other | Source: Ambulatory Visit | Attending: Radiation Oncology | Admitting: Radiation Oncology

## 2011-12-23 DIAGNOSIS — C029 Malignant neoplasm of tongue, unspecified: Secondary | ICD-10-CM

## 2011-12-23 DIAGNOSIS — C01 Malignant neoplasm of base of tongue: Secondary | ICD-10-CM

## 2011-12-23 DIAGNOSIS — R509 Fever, unspecified: Secondary | ICD-10-CM

## 2011-12-23 DIAGNOSIS — R110 Nausea: Secondary | ICD-10-CM

## 2011-12-23 DIAGNOSIS — D709 Neutropenia, unspecified: Secondary | ICD-10-CM

## 2011-12-23 LAB — COMPREHENSIVE METABOLIC PANEL
AST: 11 U/L (ref 0–37)
Albumin: 3.7 g/dL (ref 3.5–5.2)
Alkaline Phosphatase: 65 U/L (ref 39–117)
BUN: 14 mg/dL (ref 6–23)
Potassium: 3.8 mEq/L (ref 3.5–5.3)
Total Bilirubin: 0.3 mg/dL (ref 0.3–1.2)

## 2011-12-23 LAB — CBC WITH DIFFERENTIAL/PLATELET
BASO%: 0.9 % (ref 0.0–2.0)
EOS%: 2.8 % (ref 0.0–7.0)
MCH: 30.1 pg (ref 27.2–33.4)
MCHC: 33.7 g/dL (ref 32.0–36.0)
MCV: 89.3 fL (ref 79.3–98.0)
MONO%: 17.4 % — ABNORMAL HIGH (ref 0.0–14.0)
NEUT%: 67.8 % (ref 39.0–75.0)
RDW: 15 % — ABNORMAL HIGH (ref 11.0–14.6)
lymph#: 0.2 10*3/uL — ABNORMAL LOW (ref 0.9–3.3)

## 2011-12-23 MED ORDER — LEVOFLOXACIN 500 MG PO TABS: 500.0000 mg | ORAL_TABLET | Freq: Every day | ORAL | Status: AC

## 2011-12-23 NOTE — Patient Instructions (Addendum)
1.  Head and neck cancer: - Low WBC count from chemo. - Last dose of Cisplatin chemo will be delayed from Monday 12/26/2011 to the following week in order for WBC to recover.   - Next week will be last week of radiation.   2.  Mouth sore:   - Continue pain medication Lortab (Hydrocodone/APAP) elixir every 6 hours as needed for pain. - Continue mouth rinse every 2-3 hours; alternating between salt/baking soda and diluted hydrogen peroxide.   3.  Follow up: - Clinic visit with Belenda Cruise, NP next Friday 12/30/11.   - Repeat PET scan Mid August 2013 to assess response to therapy and ensure no residual cancer.   4.  Weight loss: - Continue tube feed. - Continue to work with Vernell Leep. - Try your best to starting eating again:  Ice cream, yougurt, apple sauce to speed up return of your taste.  - Will maintain the feeding tube until repeat PET scan.   5.  Low grade fever: - due to low WBC.  You have prescription for Levaquin to take if you develop fever >100.57F again.   - If you have a fever and have focal source of infection such as coughing, pain with urination, or feel bad overall, please present to [facility] Emergency.

## 2011-12-23 NOTE — Telephone Encounter (Signed)
appts made and pt will p/u a sch on 4/15l

## 2011-12-23 NOTE — Progress Notes (Signed)
[facility] OFFICE PROGRESS NOTE  Cc:  [name], MD, MD  DIAGNOSIS: recurrent left submandibular squamous cell carcinoma with primary from left lateral tongue.    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  He developed recurrence in the left submandibular gland in August 2012.  He underwent on 07/27/2011 radical left neck dissection by Dr. Ezzard Standing and Dr. Geryl Rankins. Pathology case number [id number] showed total 15 lymph nodes from left radical neck dissection that was all negative. However the left submandibular gland mass showed a 3.5 cm invasive SCC, moderately differentiates involving the adjacent skeletal muscle tissue and bone a tissue and extending into the inked margin. There was no evidence of any lymphatic invasion or perineural invasion.  He underwent further resection by Dr. Mary Sella at [facility] on 09/29/2011.  I'm awaiting path from that resection.   CURRENT THERAPY:  Started adjuvant chemoradiation with q3wk Cisplatin on 11/14/11 and daily radiation.   INTERIM HISTORY:  [name] returns to clinic today for regular follow up with his father.  He has been having low grade fever to 58F the last two days.  This morning, he felt warm; and took his temperature and it was 100.106F.  He denies productive cough, dysuria, pyuria, cellulitis.  He has mouth sore from treatment which he is taking Lortab elixir 2-3x/day.  He does mouth rinse about every 3 hours.  He is trying his best to eat very small amount of apple sauce but he has no taste.  He depends on the PEG tube for nutrition and has had stable weight.  The PEG tube is without pain/discharge/erythema. His tinnitus is stable.  He does have some muffled hearing; but this is improved compared to 2 weeks ago.   Patient denies fatigue, headache, visual changes, confusion, drenching night sweats, palpable lymph node swelling, mucositis,  odynophagia, dysphagia, nausea vomiting, jaundice, chest pain, palpitation, shortness of breath, dyspnea on exertion, productive cough, gum bleeding, epistaxis, hematemesis, hemoptysis, abdominal pain, abdominal swelling, early satiety, melena, hematochezia, hematuria, skin rash, spontaneous bleeding, joint swelling, joint pain, heat or cold intolerance, bowel bladder incontinence, back pain, focal motor weakness, paresthesia, depression, suicidal or homocidal ideation, feeling hopelessness.   MEDICAL HISTORY: Past Medical History  Diagnosis Date  . Hemorrhoids   . PONV (postoperative nausea and vomiting)   . Hypertension     no medications at present  . GERD (gastroesophageal reflux disease)     no medication  . Depression     treated approx 12 years ago, no treatment at this time  . Hiatal hernia   . Cancer     left side of tongue T1 lesion excised 4/12  . Neutropenia 12/23/2011    SURGICAL HISTORY:  Past Surgical History  Procedure Date  . Inguinal herniorrhapy   . Tonsillectomy and adenoidectomy   . Excision of tongue mass     April 2012  . Hernia repair   . Neck mass excision 07/27/2011  . Radical neck dissection 07/27/2011    Procedure: RADICAL NECK DISSECTION;  Surgeon: Drema Halon, MD;  Location: [facility] OR;  Service: ENT;  Laterality: Left;  Marland Kitchen Mandibulectomy 09/2011    and scapular free flap    MEDICATIONS: Current Outpatient Prescriptions  Medication Sig Dispense Refill  . emollient (BIAFINE) cream Apply topically daily.      Marland Kitchen HYDROcodone-acetaminophen (LORTAB) 7.5-500 MG/15ML solution Take 15 mLs by mouth every 6 (six)  hours as needed for pain.  480 mL  0  . ibuprofen (ADVIL,MOTRIN) 200 MG tablet Take 400 mg by mouth every 6 (six) hours as needed. Pain       . loratadine (CLARITIN) 10 MG tablet Take 10 mg by mouth daily as needed. FOR ALLERGIES       . LORazepam (ATIVAN) 0.5 MG tablet Take 1 tablet (0.5 mg total) by mouth every 6 (six) hours as needed (Nausea  or vomiting).  90 tablet  0  . ondansetron (ZOFRAN) 8 MG tablet Take 1 tablet two times a day as needed for nausea or vomiting starting on the third day after chemotherapy.  30 tablet  1  . oxyCODONE (OXYCONTIN) 10 MG 12 hr tablet Take 10 mg by mouth every 12 (twelve) hours.      . prochlorperazine (COMPAZINE) 10 MG tablet Take 1 tablet (10 mg total) by mouth every 6 (six) hours as needed (Nausea or vomiting).  30 tablet  1  . alum & mag hydroxide-simeth (MAALOX/MYLANTA) 200-200-20 MG/5ML suspension Take 15 mLs by mouth as needed.      . chlorhexidine (PERIDEX) 0.12 % solution Use as directed 15 mLs in the mouth or throat 2 (two) times daily.        Marland Kitchen levofloxacin (LEVAQUIN) 500 MG tablet Take 1 tablet (500 mg total) by mouth daily.  7 tablet  0    ALLERGIES:  is allergic to codeine phosphate and tape.  REVIEW OF SYSTEMS:  The rest of the 14-point review of system was negative.   Filed Vitals:   12/23/11 0905  BP: 115/78  Pulse: 94  Temp: 98.3 F (36.8 C)   Wt Readings from Last 3 Encounters:  12/23/11 176 lb (79.833 kg)  12/22/11 175 lb 3.2 oz (79.47 kg)  12/19/11 175 lb 1.6 oz (79.425 kg)   ECOG Performance status: 1  PHYSICAL EXAMINATION:   General:  well-nourished in no acute distress.  Eyes:  no scleral icterus.  ENT:  There was filing defect in left mandible.  External flap in left mandible from left axilla. The flap is still edematous (but stable compared to prior to start of chemo), without purulent discharge, erythema, or pain on palpation.  Trach stoma is now closed with no purulent discharge.  Lymphatics:  Negative cervical, supraclavicular or axillary adenopathy.  Respiratory: lungs were clear bilaterally without wheezing or crackles.  Cardiovascular:  Regular rate and rhythm, S1/S2, without murmur, rub or gallop.  There was no pedal edema.  GI:  abdomen was soft, flat, nontender, nondistended, without organomegaly. PEG tube dry/clean/intact.  Muscoloskeletal:  no spinal  tenderness of palpation of vertebral spine.  Skin exam was without echymosis, petichae.  Left axilla site where the flap was harvested has staples in place with no erythema, purulent discharge or pain on palpation.    Patient was able to get on and off exam table without assistance.  Gait was normal.  Patient was alerted and oriented.  Attention was good.   Language was appropriate.  Mood was normal without depression.  Speech was not pressured.  Thought content was not tangential.     LABORATORY/RADIOLOGY DATA:  Lab Results  Component Value Date   WBC 1.5* 12/23/2011   HGB 9.1* 12/23/2011   HCT 27.1* 12/23/2011   PLT 156 12/23/2011   GLUCOSE 124* 12/16/2011   CHOL 160 02/14/2008   TRIG 442* 02/14/2008   HDL 26.5* 02/14/2008   LDLDIRECT 64.8 02/14/2008   ALT 11 12/16/2011   AST  15 12/16/2011   NA 138 12/16/2011   K 4.0 12/16/2011   CL 98 12/16/2011   CREATININE 0.86 12/16/2011   BUN 14 12/16/2011   CO2 30 12/16/2011   TSH 2.38 02/14/2008     ASSESSMENT AND PLAN:   1. Smoking: none at this time.  2. Recurrent left lateral tongue squamous cell carcinoma:  He is doing well with adjuvant chemoradiation.  He has grade 1 nausea/vomiting, grade 2 tinnitus, grade 1 fatigue, grade 2 neutropenia .  Next cycle of chemo from 12/26/2011 will be delayed to 01/02/2012 for his count to recover.   3.  Low grade fever:  No source.  Most likely due to neck irritation from treatment.  I gave him  Rx for Levaquin in case he has fever to more than 100.45F.  I advised him that if he has fever and feels very bad, he needs to present to ED instead of taking oral antibiotics.   4.  Calorie/Protein malnutrition: stable weight.  He is trying his best to take in oral.  He is doing swallowing exercises.   5.  Mucositis:  Salt/baking soda mouth rinses alternating with diluted H2O2 every 2-3 hours.   6.  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  This is stable.   7.  Nausea/vomiting:  He has Compazine/Zofran/Ativan prn.   8.   Mouth pain from resection and ongoing chemoradiation:  He only takes Lortab 2-3x/day.  He does not need fentanyl patch yet.   9.  Follow up:  Lab/RV with Belenda Cruise on 12/30/11 to see if he can receive the last dose of chemo on 01/02/2012.

## 2011-12-26 ENCOUNTER — Ambulatory Visit
Admission: RE | Admit: 2011-12-26 | Discharge: 2011-12-26 | Disposition: A | Discharge status: Home / Self Care | Payer: Medicaid Other | Source: Ambulatory Visit | Attending: Radiation Oncology | Admitting: Radiation Oncology

## 2011-12-26 ENCOUNTER — Encounter (HOSPITAL_COMMUNITY): Payer: Self-pay

## 2011-12-26 ENCOUNTER — Encounter: Payer: Self-pay | Admitting: Nutrition

## 2011-12-26 ENCOUNTER — Other Ambulatory Visit: Payer: Self-pay | Admitting: Oncology

## 2011-12-26 ENCOUNTER — Inpatient Hospital Stay (HOSPITAL_COMMUNITY): Payer: Medicaid Other

## 2011-12-26 ENCOUNTER — Ambulatory Visit: Admission: RE | Admit: 2011-12-26 | Payer: Medicaid Other | Source: Ambulatory Visit

## 2011-12-26 ENCOUNTER — Encounter: Payer: Self-pay | Admitting: Radiation Oncology

## 2011-12-26 ENCOUNTER — Ambulatory Visit: Payer: Medicaid Other

## 2011-12-26 ENCOUNTER — Ambulatory Visit: Payer: Self-pay

## 2011-12-26 ENCOUNTER — Inpatient Hospital Stay (HOSPITAL_COMMUNITY)
Admission: AD | Admit: 2011-12-26 | Discharge: 2011-12-29 | DRG: 603 | Disposition: A | Discharge status: Home / Self Care | Payer: Medicaid Other | Source: Ambulatory Visit | Attending: Oncology | Admitting: Oncology

## 2011-12-26 ENCOUNTER — Telehealth: Payer: Self-pay | Admitting: *Deleted

## 2011-12-26 VITALS — BP 129/82 | HR 94 | Temp 98.7°F | Resp 20 | Wt 179.4 lb

## 2011-12-26 DIAGNOSIS — K219 Gastro-esophageal reflux disease without esophagitis: Secondary | ICD-10-CM | POA: Diagnosis present

## 2011-12-26 DIAGNOSIS — T451X5A Adverse effect of antineoplastic and immunosuppressive drugs, initial encounter: Secondary | ICD-10-CM | POA: Diagnosis present

## 2011-12-26 DIAGNOSIS — L0291 Cutaneous abscess, unspecified: Secondary | ICD-10-CM

## 2011-12-26 DIAGNOSIS — C029 Malignant neoplasm of tongue, unspecified: Secondary | ICD-10-CM

## 2011-12-26 DIAGNOSIS — D6481 Anemia due to antineoplastic chemotherapy: Secondary | ICD-10-CM | POA: Diagnosis present

## 2011-12-26 DIAGNOSIS — H9319 Tinnitus, unspecified ear: Secondary | ICD-10-CM | POA: Diagnosis present

## 2011-12-26 DIAGNOSIS — I10 Essential (primary) hypertension: Secondary | ICD-10-CM | POA: Diagnosis present

## 2011-12-26 DIAGNOSIS — D709 Neutropenia, unspecified: Secondary | ICD-10-CM

## 2011-12-26 DIAGNOSIS — C770 Secondary and unspecified malignant neoplasm of lymph nodes of head, face and neck: Secondary | ICD-10-CM

## 2011-12-26 DIAGNOSIS — Z79899 Other long term (current) drug therapy: Secondary | ICD-10-CM

## 2011-12-26 DIAGNOSIS — G8918 Other acute postprocedural pain: Secondary | ICD-10-CM | POA: Diagnosis present

## 2011-12-26 DIAGNOSIS — R5081 Fever presenting with conditions classified elsewhere: Secondary | ICD-10-CM | POA: Diagnosis present

## 2011-12-26 DIAGNOSIS — Y842 Radiological procedure and radiotherapy as the cause of abnormal reaction of the patient, or of later complication, without mention of misadventure at the time of the procedure: Secondary | ICD-10-CM | POA: Diagnosis present

## 2011-12-26 DIAGNOSIS — E46 Unspecified protein-calorie malnutrition: Secondary | ICD-10-CM | POA: Diagnosis present

## 2011-12-26 DIAGNOSIS — D702 Other drug-induced agranulocytosis: Secondary | ICD-10-CM | POA: Diagnosis present

## 2011-12-26 DIAGNOSIS — K1230 Oral mucositis (ulcerative), unspecified: Secondary | ICD-10-CM

## 2011-12-26 DIAGNOSIS — L0211 Cutaneous abscess of neck: Principal | ICD-10-CM | POA: Diagnosis present

## 2011-12-26 DIAGNOSIS — T310 Burns involving less than 10% of body surface: Secondary | ICD-10-CM | POA: Diagnosis present

## 2011-12-26 DIAGNOSIS — Z931 Gastrostomy status: Secondary | ICD-10-CM

## 2011-12-26 DIAGNOSIS — L03221 Cellulitis of neck: Principal | ICD-10-CM | POA: Diagnosis present

## 2011-12-26 DIAGNOSIS — R112 Nausea with vomiting, unspecified: Secondary | ICD-10-CM | POA: Diagnosis present

## 2011-12-26 DIAGNOSIS — T2007XA Burn of unspecified degree of neck, initial encounter: Secondary | ICD-10-CM | POA: Diagnosis present

## 2011-12-26 DIAGNOSIS — Z87891 Personal history of nicotine dependence: Secondary | ICD-10-CM

## 2011-12-26 DIAGNOSIS — H9190 Unspecified hearing loss, unspecified ear: Secondary | ICD-10-CM | POA: Diagnosis present

## 2011-12-26 DIAGNOSIS — K121 Other forms of stomatitis: Secondary | ICD-10-CM | POA: Diagnosis present

## 2011-12-26 LAB — CBC
MCH: 29.3 pg (ref 26.0–34.0)
Platelets: 223 10*3/uL (ref 150–400)
RBC: 2.97 MIL/uL — ABNORMAL LOW (ref 4.22–5.81)
RDW: 14 % (ref 11.5–15.5)
WBC: 3 10*3/uL — ABNORMAL LOW (ref 4.0–10.5)

## 2011-12-26 LAB — COMPREHENSIVE METABOLIC PANEL
BUN: 10 mg/dL (ref 6–23)
CO2: 30 mEq/L (ref 19–32)
Chloride: 95 mEq/L — ABNORMAL LOW (ref 96–112)
Creatinine, Ser: 0.65 mg/dL (ref 0.50–1.35)
GFR calc non Af Amer: >90 mL/min (ref >90)
Total Bilirubin: 0.2 mg/dL — ABNORMAL LOW (ref 0.3–1.2)

## 2011-12-26 LAB — URINALYSIS, ROUTINE W REFLEX MICROSCOPIC
Glucose, UA: NEGATIVE mg/dL
Leukocytes, UA: NEGATIVE
Nitrite: NEGATIVE
Protein, ur: NEGATIVE mg/dL
Urobilinogen, UA: 0.2 mg/dL (ref 0.0–1.0)

## 2011-12-26 LAB — DIFFERENTIAL
Basophils Absolute: 0 10*3/uL (ref 0.0–0.1)
Eosinophils Absolute: 0.1 10*3/uL (ref 0.0–0.7)
Lymphocytes Relative: 14 % (ref 12–46)
Lymphs Abs: 0.4 10*3/uL — ABNORMAL LOW (ref 0.7–4.0)
Neutrophils Relative %: 68 % (ref 43–77)

## 2011-12-26 MED ORDER — ACETAMINOPHEN 325 MG PO TABS
650.0000 mg | ORAL_TABLET | Freq: Four times a day (QID) | ORAL | Status: DC | PRN
  Filled 2011-12-26: qty 2

## 2011-12-26 MED ORDER — VANCOMYCIN HCL IN DEXTROSE 1-5 GM/200ML-% IV SOLN
1000.0000 mg | Freq: Three times a day (TID) | INTRAVENOUS | Status: DC
  Administered 2011-12-26 – 2011-12-28 (×6): 1000 mg via INTRAVENOUS
  Filled 2011-12-26 (×7): qty 200

## 2011-12-26 MED ORDER — HYDROGEN PEROXIDE 3 % EX SOLN
Freq: Four times a day (QID) | CUTANEOUS | Status: DC
  Administered 2011-12-26 – 2011-12-29 (×11): via TOPICAL
  Filled 2011-12-26: qty 473

## 2011-12-26 MED ORDER — PRUTECT EX EMUL
Freq: Two times a day (BID) | CUTANEOUS | Status: DC
  Administered 2011-12-26 – 2011-12-29 (×6): via TOPICAL
  Filled 2011-12-26: qty 45

## 2011-12-26 MED ORDER — LORAZEPAM 0.5 MG PO TABS: 0.5000 mg | ORAL_TABLET | Freq: Four times a day (QID) | ORAL | Status: DC | PRN

## 2011-12-26 MED ORDER — PROCHLORPERAZINE MALEATE 10 MG PO TABS: 10.0000 mg | ORAL_TABLET | Freq: Four times a day (QID) | ORAL | Status: DC | PRN

## 2011-12-26 MED ORDER — OSMOLITE 1.5 CAL PO LIQD
237.0000 mL | Freq: Four times a day (QID) | ORAL | Status: DC
  Administered 2011-12-26 – 2011-12-27 (×3): 237 mL
  Filled 2011-12-26 (×6): qty 237

## 2011-12-26 MED ORDER — HYDROCODONE-ACETAMINOPHEN 7.5-500 MG/15ML PO SOLN
15.0000 mL | Freq: Four times a day (QID) | ORAL | Status: DC | PRN
  Administered 2011-12-26 – 2011-12-29 (×10): 15 mL via ORAL
  Filled 2011-12-26 (×9): qty 15

## 2011-12-26 MED ORDER — ENOXAPARIN SODIUM 40 MG/0.4ML ~~LOC~~ SOLN
40.0000 mg | SUBCUTANEOUS | Status: DC
  Filled 2011-12-26 (×4): qty 0.4

## 2011-12-26 MED ORDER — DEXTROSE 5 % IV SOLN
2.0000 g | Freq: Three times a day (TID) | INTRAVENOUS | Status: DC
  Administered 2011-12-26 – 2011-12-28 (×6): 2 g via INTRAVENOUS
  Filled 2011-12-26 (×7): qty 2

## 2011-12-26 MED ORDER — ONDANSETRON HCL 8 MG PO TABS
8.0000 mg | ORAL_TABLET | Freq: Two times a day (BID) | ORAL | Status: DC
  Filled 2011-12-26 (×7): qty 1

## 2011-12-26 MED ORDER — LORATADINE 10 MG PO TABS
10.0000 mg | ORAL_TABLET | Freq: Every day | ORAL | Status: DC | PRN
  Filled 2011-12-26: qty 1

## 2011-12-26 MED ORDER — OSMOLITE 1.5 CAL PO LIQD
237.0000 mL | Freq: Four times a day (QID) | ORAL | Status: DC
  Administered 2011-12-26: 237 mL
  Filled 2011-12-26 (×4): qty 237

## 2011-12-26 MED ORDER — OXYCODONE HCL 10 MG PO TB12
10.0000 mg | ORAL_TABLET | Freq: Two times a day (BID) | ORAL | Status: DC
  Administered 2011-12-27 – 2011-12-29 (×5): 10 mg via ORAL
  Filled 2011-12-26 (×5): qty 1

## 2011-12-26 MED ORDER — MORPHINE SULFATE 4 MG/ML IJ SOLN
4.0000 mg | INTRAMUSCULAR | Status: DC | PRN
  Administered 2011-12-26 – 2011-12-28 (×6): 4 mg via INTRAVENOUS
  Filled 2011-12-26 (×6): qty 1

## 2011-12-26 MED ORDER — PRO-STAT SUGAR FREE PO LIQD
30.0000 mL | Freq: Two times a day (BID) | ORAL | Status: DC
  Administered 2011-12-27: 30 mL
  Filled 2011-12-26 (×2): qty 30

## 2011-12-26 MED ORDER — CHLORHEXIDINE GLUCONATE 0.12 % MT SOLN
15.0000 mL | Freq: Two times a day (BID) | OROMUCOSAL | Status: DC
  Administered 2011-12-26 – 2011-12-29 (×6): 15 mL via OROMUCOSAL
  Filled 2011-12-26 (×8): qty 15

## 2011-12-26 MED ORDER — SODIUM CHLORIDE 0.9 % IV SOLN
INTRAVENOUS | Status: DC
  Administered 2011-12-26: 125 mL/h via INTRAVENOUS
  Administered 2011-12-27 – 2011-12-29 (×3): via INTRAVENOUS

## 2011-12-26 NOTE — Progress Notes (Signed)
After completing assessment and paged MD, patient also stated " Saturday, there on my left side of face, a  2-3 cm area opened up and cream colored drainage oozed out just by touching his face, he used 2 kleenex's then it stopped, has scabbed over, informed Md, no treatment today per Dr.MAnning, called Dr. Lodema Pilot nurse,got voice mail, so called triage and spoke with Rogue Bussing, then handed phone to Dr,.Kathrynn Running ,feels patient may need to be admitted 10:27 AM

## 2011-12-26 NOTE — Progress Notes (Signed)
Weekly Management Note:  Site:L Neck/oral cavity Current Dose:  5800  cGy Projected Dose: 6600  cGy  Narrative: The patient is seen today for routine under treatment assessment. CBCT/MVCT images/port films were reviewed. The chart was reviewed.   He was admitted this morning after developing a neck abscess which was drained and cultured by Dr. Ezzard Standing. He develop a fever of the weekend and was found to have a white blood count of 1.5 K. late last week. His white blood count is up to 3.0 K. from earlier today.  Physical Examination: Vital signs:  Wt Readings from Last 3 Encounters:  12/26/11 176 lb (79.833 kg)  12/26/11 179 lb 6.4 oz (81.375 kg)  12/23/11 176 lb (79.833 kg)   Temp Readings from Last 3 Encounters:  12/26/11 101 F (38.3 C) Oral  12/26/11 98.7 F (37.1 C) Oral  12/23/11 98.3 F (36.8 C) Oral   BP Readings from Last 3 Encounters:  12/26/11 120/74  12/26/11 129/82  12/23/11 115/78   Pulse Readings from Last 3 Encounters:  12/26/11 105  12/26/11 94  12/23/11 94   There is a dressing along his upper neck. This is not removed.    Impression: Hold radiation therapy for now, may resume later this week before he has healing of his abscess.  Plan: Will follow patient as an inpatient for now

## 2011-12-26 NOTE — Progress Notes (Signed)
ANTIBIOTIC CONSULT NOTE - INITIAL  Pharmacy Consult for Vancomycin/Cefepime Indication: Cellulitis/febrile neutropenia  Allergies  Allergen Reactions  . Codeine Phosphate     REACTION: rash, swelling  . Tape     Blisters; please use paper tape    Patient Measurements: Height: 6' (182.9 cm) Weight: 176 lb (79.833 kg) IBW/kg (Calculated) : 77.6   Vital Signs: Temp: 99.8 F (37.7 C) (04/15 1243) Temp src: Oral (04/15 1243) BP: 149/82 mmHg (04/15 1243) Pulse Rate: 97  (04/15 1243) Intake/Output from previous day:   Intake/Output from this shift:    Labs: No results found for this basename: WBC:3,HGB:3,PLT:3,LABCREA:3,CREATININE:3 in the last 72 hours Estimated Creatinine Clearance: 118.6 ml/min (by C-G formula based on Cr of 0.75). No results found for this basename: VANCOTROUGH:2,VANCOPEAK:2,VANCORANDOM:2,GENTTROUGH:2,GENTPEAK:2,GENTRANDOM:2,TOBRATROUGH:2,TOBRAPEAK:2,TOBRARND:2,AMIKACINPEAK:2,AMIKACINTROU:2,AMIKACIN:2, in the last 72 hours   Microbiology: No results found for this or any previous visit (from the past 720 hour(s)).  Medical History: Past Medical History  Diagnosis Date  . Hemorrhoids   . PONV (postoperative nausea and vomiting)   . Hypertension     no medications at present  . GERD (gastroesophageal reflux disease)     no medication  . Depression     treated approx 12 years ago, no treatment at this time  . Hiatal hernia   . Cancer     left side of tongue T1 lesion excised 4/12  . 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 Surgeries 4/12, 11/12, and resection 1/13 involving muscle and bone.  4/12 appt: noted erythema, fever, WBC 1.5K. Levaquin begun as outpatient.   Presented for XRT 4/15; febrile, more swelling noted. (believe 32/33 treatments per prev MD note)  Chemo: cycle 3 Cisplatin due 4/15.  Neutropenia, fever, cellulitis. Blood cultures ordered.  Goal of Therapy:  Vancomycin trough level 15-20 mcg/ml  Plan:  Cefepime 2gm q8h Vancomycin 1gm q8h Plan Vanc trough soon with q8h dosing. Measure antibiotic drug levels at steady state Follow up culture results  Otho Bellows PharmD Pager [phone] 12/26/2011,12:50 PM

## 2011-12-26 NOTE — Progress Notes (Signed)
  Radiation Oncology         [phone] ________________________________  Name: Thomas Bean MRN: [id number]  Date: 12/26/2011   DOB: 03/04/1961  Weekly Radiation Therapy Management  Current Dose: 58 Gy     Planned Dose:  66 Gy  Narrative . . . . . . . . The patient presents for under treatment assessment planning note increased facial swelling and fever. According to report, the patient was noted to be neutropenic on Friday, April 12 and was given a prescription for Levaquin by Dr. Ha,  He recounts that Saturday morning, he did develop a fever of 101.7 as well as increasing facial swelling and redness particularly under the chin and along the left neck area the patient also notes that a small fistulous tract open along the upper age of his flap draining per he went material and then scant blood he for scabbing over. He asked to be seen today prior to TomoTherapy to share recent events and to discuss his ongoing course of therapy.Patient is using PEG tube for nutrition.                                 Set-up films were reviewed.                                 The chart was checked.  Physical Findings. . . vitals, T98.7, BP129/82,HR94 RR20, The patient is in no acute distress today. He does have prominent swelling in the submental area with erythema. The swelling appears to be epicenter at his soft tissue flap and there is a small scab measuring perhaps 5 mm along one incision. The native skin surrounding the flap is very tender to palpation.  Images obtained below.  Previous Appearance on 11/08/11, not pallor and lack of swelling in submental region:   Current Appearance and note site that patient describes as draining purulent material:   Impression . . . . . . . The patient appears to have cellulitis in a neutropenic setting. He has started oral Levaquin for this. I suspect, he may benefit from further evaluation and possible IV antibiotic coverage in the setting of possible  cellulitis involving his soft tissue flap in order to minimize his risk for slough as well as sepsis. I contacted his medical oncologist and the patient is being sent upstairs for evaluation and further management.  Plan . . . . . . . . . . . . In light of significant facial swelling, neutropenia, and possible cellulitis, I have asked that his Tomotherapy treatment be held today.  I suspect his mask may not properly fit until his facial swelling improves. We will look forward to resuming and completing his course of radiation as soon as he is stable with less facial swelling.  ________________________________  Thomas Bean A. Thomas Bean, M.D.   

## 2011-12-26 NOTE — Progress Notes (Signed)
  Radiation Oncology         [phone] ________________________________  Name: Thomas Bean MRN: [id number]  Date: 12/26/2011   DOB: 05/15/1961  Weekly Radiation Therapy Management  Current Dose: 58 Gy     Planned Dose:  66 Gy  Narrative . . . . . . . . The patient presents for under treatment assessment planning note increased facial swelling and fever. According to report, the patient was noted to be neutropenic on Friday, April 12 and was given a prescription for Levaquin by Dr. Gaylyn Rong,  He recounts that Saturday morning, he did develop a fever of 101.7 as well as increasing facial swelling and redness particularly under the chin and along the left neck area the patient also notes that a small fistulous tract open along the upper age of his flap draining per he went material and then scant blood he for scabbing over. He asked to be seen today prior to TomoTherapy to share recent events and to discuss his ongoing course of therapy.Patient is using PEG tube for nutrition.                                 Set-up films were reviewed.                                 The chart was checked.  Physical Findings. . . vitals, T98.7, BP129/82,HR94 RR20, The patient is in no acute distress today. He does have prominent swelling in the submental area with erythema. The swelling appears to be epicenter at his soft tissue flap and there is a small scab measuring perhaps 5 mm along one incision. The native skin surrounding the flap is very tender to palpation.  Images obtained below.  Previous Appearance on 11/08/11, not pallor and lack of swelling in submental region:   Current Appearance and note site that patient describes as draining purulent material:   Impression . . . . . . . The patient appears to have cellulitis in a neutropenic setting. He has started oral Levaquin for this. I suspect, he may benefit from further evaluation and possible IV antibiotic coverage in the setting of possible  cellulitis involving his soft tissue flap in order to minimize his risk for slough as well as sepsis. I contacted his medical oncologist and the patient is being sent upstairs for evaluation and further management.  Plan . . . . . . . . . . . . In light of significant facial swelling, neutropenia, and possible cellulitis, I have asked that his Tomotherapy treatment be held today.  I suspect his mask may not properly fit until his facial swelling improves. We will look forward to resuming and completing his course of radiation as soon as he is stable with less facial swelling.  ________________________________  Artist Pais. Kathrynn Running, M.D.

## 2011-12-26 NOTE — Progress Notes (Signed)
Pt alert oriented x 3,  Head and neck cancer, rad tx 29/33, here for increased swelling since Friday, was put on Levaquin by Dr. Gaylyn Rong Friday if temp >100,5, , Friday 100.4, Saturday temp=101.7 5am, all weekend up, started the Levaquin, today vitals, 98.7, 129/82,94-20, did take Levaquin this am, swelling right side neck, erythema, left next,peeeling, erythema, dry  desquamation, patient tis using biafine cream prn, also has been taking advil 400mg , q 4 hours and hydrocodone/acetamethine  q 6 hours, rotating meds, difficultly swallowing and bending neck dowm, labs from 12/23/11, wbc=1.5, neutr=1.0,hgb=9.1,plt=156, wants to be seen before treatment today, pain a 4 at present 9:57 AM

## 2011-12-26 NOTE — Telephone Encounter (Signed)
SPOKE WITH JANICE,RN AND DR.MANNING. THIS WEEKEND PT.'S TEMPERATURE INCREASED AND THERE WAS SOME DRAINAGE FROM PT.'S FLAP WHICH WAS DONE AT [facility] SO HE BEGAN THE LEVAQUIN. THIS MORNING TEMPERATURE IS NORMAL AND DRAINAGE HAS IMPROVED BUT THERE IS REDNESS ALONG WITH THE INCREASED SWELLING IN PT.'S FACE AND NECK. PERHAPS CELLULITIS. SHOULD DR.MANNING SENT PT. TO THE EMERGENCY ROOM? THIS NOTE TO DR.HA'S NURSE, NATALIE STROUD,RN.

## 2011-12-26 NOTE — Progress Notes (Signed)
Patient now with fever 101, blood cultures done , urine done , ok, cxry done , no active problems, maxipime and vancomycin  Started . LORTAB GIVEN WHICH CONTAINS TYLENOL, WILL MONITOR. INCISION AND DRAINAGE DONE BY DR. Ezzard Standing, CULTURE SENT
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
## 2011-12-26 NOTE — Progress Notes (Addendum)
INITIAL ADULT NUTRITION ASSESSMENT Date: 12/26/2011   Time: 4:19 PM Reason for Assessment: Consult  ASSESSMENT: Male 51 y.o.  Dx: Fever  Hx: Past Medical History  Diagnosis Date  . Hemorrhoids   . PONV (postoperative nausea and vomiting)   . Hypertension     no medications at present  . GERD (gastroesophageal reflux disease)     no medication  . Depression     treated approx 12 years ago, no treatment at this time  . Hiatal hernia   . Cancer     left side of tongue T1 lesion excised 4/12  . Neutropenia 12/23/2011   Related Meds:  Scheduled Meds:   . ceFEPime (MAXIPIME) IV  2 g Intravenous Q8H  . chlorhexidine  15 mL Mouth/Throat BID  . enoxaparin  40 mg Subcutaneous Q24H  . feeding supplement (OSMOLITE 1.5 CAL)  237 mL Per Tube QID  . hydrogen peroxide   Topical QID  . ondansetron  8 mg Oral Q12H  . oxyCODONE  10 mg Oral Q12H  . PRUTECT   Topical BID  . vancomycin  1,000 mg Intravenous Q8H   Continuous Infusions:   . sodium chloride 125 mL/hr (12/26/11 1413)   PRN Meds:.HYDROcodone-acetaminophen, loratadine, LORazepam, morphine, prochlorperazine  Ht: 6' (182.9 cm)  Wt: 176 lb (79.833 kg)  Ideal Wt: 178 lb % Ideal Wt: 98  Wt Readings from Last 10 Encounters:  12/26/11 176 lb (79.833 kg)  12/26/11 179 lb 6.4 oz (81.375 kg)  12/23/11 176 lb (79.833 kg)  12/22/11 175 lb 3.2 oz (79.47 kg)  12/19/11 175 lb 1.6 oz (79.425 kg)  12/19/11 175 lb 4.8 oz (79.516 kg)  12/12/11 175 lb 8 oz (79.606 kg)  12/12/11 175 lb 11.2 oz (79.697 kg)  12/05/11 181 lb 6.4 oz (82.283 kg)  12/01/11 181 lb 9.6 oz (82.373 kg)    Usual Wt: 175-180 lbs % Usual Wt: 100  Body mass index is 23.87 kg/(m^2).  Food/Nutrition Related Hx: Pt with left oral tongue CA s/p resection and currently receiving adjuvant chemotherapy. Pt followed by outpatient RD at [facility] Pt reports using 6 cans of Osmolite 1.5 per day via PEG - provides 2130 calories, 89g protein, free water.  Pt states he usually adds 1 scoop or 30g of protein to one of these cans of formula. Pt states he has been flushing tube with at least 60ml before and after each feeding and states he gets at least 64 ounces of fluid total/day. Pt states he is not eating much by mouth because "everything tastes like crap, even water". Pt repots having to use stool softeners occasionally r/t constipation from pain medication. Pt with increased redness and swelling on lower left side of face and neck which started on Saturday which has kept him from swallowing much of anything. Pt states his weight has been stable throughout treatment, between 175-180 pounds.   DG of chest on 12/26/11 showed: No plain film evidence of acute or metastatic disease.   Labs:  CMP     Component Value Date/Time   NA 134* 12/26/2011 1235   K 3.3* 12/26/2011 1235   CL 95* 12/26/2011 1235   CO2 30 12/26/2011 1235   GLUCOSE 111* 12/26/2011 1235   BUN 10 12/26/2011 1235   CREATININE 0.65 12/26/2011 1235   CALCIUM 9.5 12/26/2011 1235   PROT 7.6 12/26/2011 1235   ALBUMIN 3.4* 12/26/2011 1235   AST 11 12/26/2011 1235   ALT 7 12/26/2011 1235   ALKPHOS  67 12/26/2011 1235   BILITOT 0.2* 12/26/2011 1235   GFRNONAA >90 12/26/2011 1235   GFRAA >90 12/26/2011 1235   No intake or output data in the 24 hours ending 12/26/11 1642  Last BM - 12/24/11   Diet Order: Full Liquid  Supplements/Tube Feeding: Osmolite 1.5 - 1.5 cans via PEG, 4 times daily  IVF:    sodium chloride Last Rate: 125 mL/hr (12/26/11 1413)    Estimated Nutritional Needs:   Kcal: 2400-2600 Protein: 130-140g protein Fluid: 2.6 L  NUTRITION DIAGNOSIS: -Inadequate oral intake (NI-2.1).  Status: Ongoing  RELATED TO: left tongue CA on chemoradiation  AS EVIDENCE BY: PEG and TF to meet nutritional needs  MONITORING/EVALUATION(Goals): TF to meet >90% of estimated nutritional needs.   EDUCATION NEEDS: -No education needs identified at this time  INTERVENTION: Prostat BID via  PEG. Will continue to monitor for TF tolerance.   Dietitian #: [phone]  DOCUMENTATION CODES Per approved criteria  -Not Applicable    Marshall Cork 12/26/2011, 4:19 PM

## 2011-12-26 NOTE — Consult Note (Signed)
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 Admitted now with fever and neck swelling. EXAM Abscess pointing at the anterior border of the free flap where he had previous drain placed No airway problems. No intraoral swelling I&D of abscess at bedside with 15-20cc pus obtained and cultured Abscess cavity packed with approximately 40cm of iodoform gauze IMP Neck abscess REC I&D with culture obtained IV antibiotics Will follow up to assist in packing removal

## 2011-12-27 ENCOUNTER — Ambulatory Visit: Payer: Medicaid Other

## 2011-12-27 DIAGNOSIS — L0291 Cutaneous abscess, unspecified: Secondary | ICD-10-CM | POA: Diagnosis present

## 2011-12-27 DIAGNOSIS — R5081 Fever presenting with conditions classified elsewhere: Secondary | ICD-10-CM | POA: Diagnosis present

## 2011-12-27 DIAGNOSIS — K1230 Oral mucositis (ulcerative), unspecified: Secondary | ICD-10-CM | POA: Diagnosis present

## 2011-12-27 DIAGNOSIS — D709 Neutropenia, unspecified: Secondary | ICD-10-CM | POA: Diagnosis present

## 2011-12-27 LAB — BASIC METABOLIC PANEL
CO2: 28 mEq/L (ref 19–32)
Chloride: 100 mEq/L (ref 96–112)
GFR calc Af Amer: >90 mL/min (ref >90)
Potassium: 3.5 mEq/L (ref 3.5–5.1)
Sodium: 136 mEq/L (ref 135–145)

## 2011-12-27 LAB — CBC
Platelets: 213 10*3/uL (ref 150–400)
RBC: 2.5 MIL/uL — ABNORMAL LOW (ref 4.22–5.81)
RDW: 14.2 % (ref 11.5–15.5)
WBC: 3.1 10*3/uL — ABNORMAL LOW (ref 4.0–10.5)

## 2011-12-27 LAB — VANCOMYCIN, TROUGH: Vancomycin Tr: 13.2 ug/mL (ref 10.0–20.0)

## 2011-12-27 MED ORDER — OSMOLITE 1.5 CAL PO LIQD
237.0000 mL | Freq: Four times a day (QID) | ORAL | Status: DC
  Administered 2011-12-27: 474 mL
  Administered 2011-12-27: 237 mL
  Administered 2011-12-28: 474 mL
  Administered 2011-12-28: 237 mL
  Administered 2011-12-28: 474 mL
  Administered 2011-12-28 – 2011-12-29 (×2): 237 mL
  Filled 2011-12-27 (×12): qty 474

## 2011-12-27 MED ORDER — METRONIDAZOLE IN NACL 5-0.79 MG/ML-% IV SOLN
500.0000 mg | Freq: Three times a day (TID) | INTRAVENOUS | Status: DC
  Administered 2011-12-27 – 2011-12-28 (×3): 500 mg via INTRAVENOUS
  Filled 2011-12-27 (×5): qty 100

## 2011-12-27 MED ORDER — NON FORMULARY: 1.0000 | Freq: Every day | Status: DC

## 2011-12-27 MED ORDER — FREE WATER
60.0000 mL | Freq: Four times a day (QID) | Status: DC
  Administered 2011-12-27 – 2011-12-29 (×9): 60 mL

## 2011-12-27 MED ORDER — WHEY PROTEIN POWD
1.0000 "application " | Freq: Every day | Status: DC
  Administered 2011-12-28 – 2011-12-29 (×2): 1

## 2011-12-27 MED FILL — Lidocaine Inj 1% w/ Epinephrine-1:100000: INTRAMUSCULAR | Qty: 30 | Status: AC

## 2011-12-27 NOTE — Progress Notes (Signed)
ANTIBIOTIC CONSULT NOTE - FOLLOW UP  Pharmacy Consult for Vancomcyin Indication: Neck cellulitis/abscess  Allergies  Allergen Reactions  . Codeine Phosphate     REACTION: rash, swelling  . Tape     Blisters; please use paper tape    Patient Measurements: Height: 6' (182.9 cm) Weight: 176 lb (79.833 kg) IBW/kg (Calculated) : 77.6    Vital Signs: Temp: 98.4 F (36.9 C) (04/16 2055) Temp src: Oral (04/16 2055) BP: 114/66 mmHg (04/16 2055) Pulse Rate: 78  (04/16 2055) Intake/Output from previous day: 04/15 0701 - 04/16 0700 In: 2611 [P.O.:700; I.V.:1911] Out: -  Intake/Output from this shift:    Labs:  Basename 12/27/11 0350 12/26/11 1235  WBC 3.1* 3.0*  HGB 7.5* 8.7*  PLT 213 223  LABCREA -- --  CREATININE 0.64 0.65   Estimated Creatinine Clearance: 118.6 ml/min (by C-G formula based on Cr of 0.64).  Basename 12/27/11 2143  VANCOTROUGH 13.2  VANCOPEAK --  VANCORANDOM --  GENTTROUGH --  GENTPEAK --  GENTRANDOM --  TOBRATROUGH --  TOBRAPEAK --  TOBRARND --  AMIKACINPEAK --  AMIKACINTROU --  AMIKACIN --     Microbiology: Recent Results (from the past 720 hour(s))  CULTURE, BLOOD (ROUTINE X 2)     Status: Normal (Preliminary result)   Collection Time   12/26/11 12:35 PM      Component Value Range Status Comment   Specimen Description BLOOD LEFT ARM   Final    Special Requests BOTTLES DRAWN AEROBIC AND ANAEROBIC 10CC   Final    Culture  Setup Time [id number]   Final    Culture     Final    Value:        BLOOD CULTURE RECEIVED NO GROWTH TO DATE CULTURE WILL BE HELD FOR 5 DAYS BEFORE ISSUING A FINAL NEGATIVE REPORT   Report Status PENDING   Incomplete   CULTURE, BLOOD (ROUTINE X 2)     Status: Normal (Preliminary result)   Collection Time   12/26/11 12:39 PM      Component Value Range Status Comment   Specimen Description BLOOD LEFT ARM   Final    Special Requests BOTTLES DRAWN AEROBIC AND ANAEROBIC 10CC   Final    Culture  Setup Time [id number]    Final    Culture     Final    Value:        BLOOD CULTURE RECEIVED NO GROWTH TO DATE CULTURE WILL BE HELD FOR 5 DAYS BEFORE ISSUING A FINAL NEGATIVE REPORT   Report Status PENDING   Incomplete   WOUND CULTURE     Status: Normal (Preliminary result)   Collection Time   12/26/11  6:38 PM      Component Value Range Status Comment   Specimen Description NECK   Final    Special Requests NONE   Final    Gram Stain     Final    Value: RARE WBC PRESENT, PREDOMINANTLY MONONUCLEAR     NO SQUAMOUS EPITHELIAL CELLS SEEN     NO ORGANISMS SEEN   Culture NO GROWTH   Final    Report Status PENDING   Incomplete     Anti-infectives     Start     Dose/Rate Route Frequency Ordered Stop   12/27/11 1000   metroNIDAZOLE (FLAGYL) IVPB 500 mg        500 mg 100 mL/hr over 60 Minutes Intravenous Every 8 hours 12/27/11 0910     12/26/11 1400   vancomycin (VANCOCIN)  IVPB 1000 mg/200 mL premix        1,000 mg 200 mL/hr over 60 Minutes Intravenous Every 8 hours 12/26/11 1249     12/26/11 1300   ceFEPIme (MAXIPIME) 2 g in dextrose 5 % 50 mL IVPB        2 g 100 mL/hr over 30 Minutes Intravenous 3 times per day 12/26/11 1248            Assessment:  51 yo male with 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 Surgeries 4/12, 11/12, and resection 1/13 involving muscle and bone. 4/12 appt: noted erythema, fever, WBC 1.5K. Levaquin begun as outpatient. Presented for XRT 4/15; febrile, more swelling noted.   Day #2 vancomycin, cefepime, flagyl  Vanc trough slightly below goal, but expect continued accumulation, therefore will not change dose at this time.   Goal of Therapy:  Vancomycin trough level 15-20 mcg/ml  Plan:   Continue vancomycin 1gm IV q8h  Continue to follow renal function & cultures  Loralee Pacas, PharmD, BCPS Pager: [phone] 12/27/2011,10:35 PM

## 2011-12-27 NOTE — Progress Notes (Signed)
[facility] INPATIENT PROGRESS NOTE  Name: Thomas Bean      MRN: [id number]    Location: 1319/1319-01  Date: 12/27/2011 Time:8:45 AM   Subjective: Interval History:Thomas Bean reported that he again had fever yesterday.  He had a large pocket of abscess in his flap incised and drained by Dr. Ezzard Standing last night.  He still has mucositis pain; he takes OxyContin and Lortab elixir with control of his symptoms.  He denies nausea/vomiting, SOB, chest pain, abdominal pain, diarrhea, visible source of bleeding.  Objective: Vital signs in last 24 hours: Temp:  [98 F (36.7 C)-101 F (38.3 C)] 98 F (36.7 C) (04/16 0435) Pulse Rate:  [76-105] 76  (04/16 0435) Resp:  [17-20] 17  (04/16 0435) BP: (111-149)/(65-82) 111/65 mmHg (04/16 0435) SpO2:  [98 %-100 %] 100 % (04/16 0435) Weight:  [176 lb (79.833 kg)-179 lb 6.4 oz (81.375 kg)] 176 lb (79.833 kg) (04/15 1243)     PHYSICAL EXAM:  General: well-nourished in no acute distress. Eyes: no scleral icterus. ENT: There was filing defect in left mandible. There was an incisional wound in the chin area. Lymphatics: Difficult to tell if there was adenopathy due to flap edema. Respiratory: lungs were clear bilaterally without wheezing or crackles. Cardiovascular: Regular rate and rhythm, S1/S2, without murmur, rub or gallop. There was no pedal edema. GI: abdomen was soft, flat, nontender, nondistended, without organomegaly. PEG tube dry/clean/intact. Muscoloskeletal: no spinal tenderness of palpation of vertebral spine. Skin exam was without echymosis, petichae.  Studies/Results: Results for orders placed during the hospital encounter of 12/26/11 (from the past 48 hour(s))  COMPREHENSIVE METABOLIC PANEL     Status: Abnormal   Collection Time   12/26/11 12:35 PM      Component Value Range Comment   Sodium 134 (*) 135 - 145 (mEq/L)    Potassium 3.3 (*) 3.5 - 5.1 (mEq/L)    Chloride 95 (*) 96 - 112 (mEq/L)    CO2 30  19 - 32  (mEq/L)    Glucose, Bld 111 (*) 70 - 99 (mg/dL)    BUN 10  6 - 23 (mg/dL)    Creatinine, Ser 4.09  0.50 - 1.35 (mg/dL)    Calcium 9.5  8.4 - 10.5 (mg/dL)    Total Protein 7.6  6.0 - 8.3 (g/dL)    Albumin 3.4 (*) 3.5 - 5.2 (g/dL)    AST 11  0 - 37 (U/L)    ALT 7  0 - 53 (U/L)    Alkaline Phosphatase 67  39 - 117 (U/L)    Total Bilirubin 0.2 (*) 0.3 - 1.2 (mg/dL)    GFR calc non Af Amer >90  >90 (mL/min)    GFR calc Af Amer >90  >90 (mL/min)   CBC     Status: Abnormal   Collection Time   12/26/11 12:35 PM      Component Value Range Comment   WBC 3.0 (*) 4.0 - 10.5 (K/uL)    RBC 2.97 (*) 4.22 - 5.81 (MIL/uL)    Hemoglobin 8.7 (*) 13.0 - 17.0 (g/dL)    HCT 81.1 (*) 91.4 - 52.0 (%)    MCV 90.6  78.0 - 100.0 (fL)    MCH 29.3  26.0 - 34.0 (pg)    MCHC 32.3  30.0 - 36.0 (g/dL)    RDW 78.2  95.6 - 21.3 (%)    Platelets 223  150 - 400 (K/uL)   DIFFERENTIAL     Status: Abnormal  Collection Time   12/26/11 12:35 PM      Component Value Range Comment   Neutrophils Relative 68  43 - 77 (%)    Neutro Abs 2.1  1.7 - 7.7 (K/uL)    Lymphocytes Relative 14  12 - 46 (%)    Lymphs Abs 0.4 (*) 0.7 - 4.0 (K/uL)    Monocytes Relative 16 (*) 3 - 12 (%)    Monocytes Absolute 0.5  0.1 - 1.0 (K/uL)    Eosinophils Relative 2  0 - 5 (%)    Eosinophils Absolute 0.1  0.0 - 0.7 (K/uL)    Basophils Relative 0  0 - 1 (%)    Basophils Absolute 0.0  0.0 - 0.1 (K/uL)   CULTURE, BLOOD (ROUTINE X 2)     Status: Normal (Preliminary result)   Collection Time   12/26/11 12:35 PM      Component Value Range Comment   Specimen Description BLOOD LEFT ARM      Special Requests BOTTLES DRAWN AEROBIC AND ANAEROBIC 10CC      Culture  Setup Time [id number]      Culture        Value:        BLOOD CULTURE RECEIVED NO GROWTH TO DATE CULTURE WILL BE HELD FOR 5 DAYS BEFORE ISSUING A FINAL NEGATIVE REPORT   Report Status PENDING     CULTURE, BLOOD (ROUTINE X 2)     Status: Normal (Preliminary result)   Collection Time    12/26/11 12:39 PM      Component Value Range Comment   Specimen Description BLOOD LEFT ARM      Special Requests BOTTLES DRAWN AEROBIC AND ANAEROBIC 10CC      Culture  Setup Time [id number]      Culture        Value:        BLOOD CULTURE RECEIVED NO GROWTH TO DATE CULTURE WILL BE HELD FOR 5 DAYS BEFORE ISSUING A FINAL NEGATIVE REPORT   Report Status PENDING     URINALYSIS, ROUTINE W REFLEX MICROSCOPIC     Status: Normal   Collection Time   12/26/11  2:35 PM      Component Value Range Comment   Color, Urine YELLOW  YELLOW     APPearance CLEAR  CLEAR     Specific Gravity, Urine 1.018  1.005 - 1.030     pH 7.5  5.0 - 8.0     Glucose, UA NEGATIVE  NEGATIVE (mg/dL)    Hgb urine dipstick NEGATIVE  NEGATIVE     Bilirubin Urine NEGATIVE  NEGATIVE     Ketones, ur NEGATIVE  NEGATIVE (mg/dL)    Protein, ur NEGATIVE  NEGATIVE (mg/dL)    Urobilinogen, UA 0.2  0.0 - 1.0 (mg/dL)    Nitrite NEGATIVE  NEGATIVE     Leukocytes, UA NEGATIVE  NEGATIVE  MICROSCOPIC NOT DONE ON URINES WITH NEGATIVE PROTEIN, BLOOD, LEUKOCYTES, NITRITE, OR GLUCOSE <1000 mg/dL.  WOUND CULTURE     Status: Normal (Preliminary result)   Collection Time   12/26/11  6:38 PM      Component Value Range Comment   Specimen Description NECK      Special Requests NONE      Gram Stain        Value: RARE WBC PRESENT, PREDOMINANTLY MONONUCLEAR     NO SQUAMOUS EPITHELIAL CELLS SEEN     NO ORGANISMS SEEN   Culture PENDING      Report Status PENDING  CBC     Status: Abnormal   Collection Time   12/27/11  3:50 AM      Component Value Range Comment   WBC 3.1 (*) 4.0 - 10.5 (K/uL)    RBC 2.50 (*) 4.22 - 5.81 (MIL/uL)    Hemoglobin 7.5 (*) 13.0 - 17.0 (g/dL)    HCT 16.1 (*) 09.6 - 52.0 (%)    MCV 92.4  78.0 - 100.0 (fL)    MCH 30.0  26.0 - 34.0 (pg)    MCHC 32.5  30.0 - 36.0 (g/dL)    RDW 04.5  40.9 - 81.1 (%)    Platelets 213  150 - 400 (K/uL)   BASIC METABOLIC PANEL     Status: Normal   Collection Time   12/27/11  3:50 AM       Component Value Range Comment   Sodium 136  135 - 145 (mEq/L)    Potassium 3.5  3.5 - 5.1 (mEq/L)    Chloride 100  96 - 112 (mEq/L)    CO2 28  19 - 32 (mEq/L)    Glucose, Bld 97  70 - 99 (mg/dL)    BUN 8  6 - 23 (mg/dL)    Creatinine, Ser 9.14  0.50 - 1.35 (mg/dL)    Calcium 9.2  8.4 - 10.5 (mg/dL)    GFR calc non Af Amer >90  >90 (mL/min)    GFR calc Af Amer >90  >90 (mL/min)    Dg Chest 2 View  12/26/2011  *RADIOLOGY REPORT*  Clinical Data: History of head and neck cancer.  CHEST - 2 VIEW  Comparison: Plain films of the chest 04/17/2008 and and 07/26/2011. PET CT scan 07/25/2011.  Findings: Surgical clips in the left axilla and left neck are noted.  Lungs appear clear.  Heart size is normal.  No pneumothorax or pleural effusion.  IMPRESSION: No plain film evidence of acute or metastatic disease.  Original Report Authenticated By: Bernadene Bell. D'ALESSIO, M.D.    MEDICATIONS: reviewed.     Assessment/Plan:   1. Neutropenic fever:  - From chemo. Source: Flap abscess.  - Fever work up:  PA/lat CXR was negative; wound culture; blood culture, urine culture all pending.  - Empiric antibiotics with Vancomycin and Cefepime per Pharmacy consult.   Will add on Flagyl 500mg  IV q8h since there was confirmed flap abscess and he was still febrile last night.   2. Potential flap abscess: I appreciated Dr. Myrtis Hopping assistance.   3. Anemia/leukopenia: Due to chemoradiation. Will monitor CBC and transfuse if active bleeding or Hgb <7.   4. Smoking: none at this time.   5. Calorie/Protein malnutrition: stable weight. He is trying his best to take in oral. He is doing swallowing exercises. Continue with Osmolyte 1.5 about 6 cans/day.   6. Mucositis: mouthrinse with inpatient mouth care solution alternating with diluted H2O2 every 2-3 hours.   7. Nausea/vomiting: He has Compazine/Zofran/Ativan prn.   8. Mouth pain from resection and ongoing chemoradiation: He in on OxyContin long acting and  Lortab elixir prn breakthrough pain.   9.  Oral tongue SCC:  S/p resection; s/p chemoradiation with one week left.  Chemoradiation will be on hold until patient healsfrom abscess drainage I&D.    FULL CODE.

## 2011-12-27 NOTE — Progress Notes (Signed)
The patient's progress reviewed. We will continue to hold his radiation therapy through the rest of this week. He has just 4 more treatments, and I hope to resume his next week even though his live will still be open.

## 2011-12-27 NOTE — Consult Note (Signed)
S/P I&D neck abscess AF this afternoon. Neck feeling better Dressing changed and 5 cm iodoform gauze packing removed. Culture pending but no organisms seen on gram stain.  Will recheck wound tomorrow.

## 2011-12-28 ENCOUNTER — Ambulatory Visit: Payer: Medicaid Other

## 2011-12-28 LAB — BASIC METABOLIC PANEL
Chloride: 101 mEq/L (ref 96–112)
GFR calc Af Amer: >90 mL/min (ref >90)
Potassium: 3.9 mEq/L (ref 3.5–5.1)

## 2011-12-28 LAB — CBC
Platelets: 240 10*3/uL (ref 150–400)
RDW: 14.3 % (ref 11.5–15.5)
WBC: 2.9 10*3/uL — ABNORMAL LOW (ref 4.0–10.5)

## 2011-12-28 MED ORDER — LEVOFLOXACIN 500 MG PO TABS
500.0000 mg | ORAL_TABLET | Freq: Every day | ORAL | Status: DC
  Administered 2011-12-28 – 2011-12-29 (×2): 500 mg via ORAL
  Filled 2011-12-28 (×2): qty 1

## 2011-12-28 MED ORDER — METRONIDAZOLE 500 MG PO TABS
500.0000 mg | ORAL_TABLET | Freq: Three times a day (TID) | ORAL | Status: DC
  Administered 2011-12-28 – 2011-12-29 (×4): 500 mg via ORAL
  Filled 2011-12-28 (×7): qty 1

## 2011-12-28 NOTE — Consult Note (Signed)
2 Days s/p I&D neck abscess AF Swelling better Small amount of purulent discharge from I&D site 2-3 cm of gauze packing removed. Will have patient remove packing himself when discharged and follow up my office next week

## 2011-12-28 NOTE — Progress Notes (Signed)
[facility] INPATIENT PROGRESS NOTE  Name: Thomas Bean      MRN: [id number]    Location: 1319/1319-01  Date: 12/28/2011 Time:8:55 AM   Subjective: Interval History:Christopher W Groesbeck reported no more fever since 2 days ago.  He still has mucositis pain and pain in the flap; however, he thinks that his pain is under control with pain med.  He denies SOB, CP, cough, abd pain, nausea/vomiting, skin rash.   Objective: Vital signs in last 24 hours: Temp:  [98.4 F (36.9 C)-98.8 F (37.1 C)] 98.8 F (37.1 C) (04/17 0500) Pulse Rate:  [78-83] 80  (04/17 0500) Resp:  [16-18] 18  (04/17 0500) BP: (114-144)/(66-79) 144/79 mmHg (04/17 0500) SpO2:  [100 %] 100 % (04/17 0500)     PHYSICAL EXAM:  General: well-nourished in no acute distress. Eyes: no scleral icterus. ENT: There was filing defect in left mandible. There was an incisional wound in the chin area. Lymphatics: Difficult to tell if there was adenopathy due to flap edema. Respiratory: lungs were clear bilaterally without wheezing or crackles. Cardiovascular: Regular rate and rhythm, S1/S2, without murmur, rub or gallop. There was no pedal edema. GI: abdomen was soft, flat, nontender, nondistended, without organomegaly. PEG tube dry/clean/intact. Muscoloskeletal: no spinal tenderness of palpation of vertebral spine. Skin exam was without echymosis, petichae.  Studies/Results: Results for orders placed during the hospital encounter of 12/26/11 (from the past 48 hour(s))  COMPREHENSIVE METABOLIC PANEL     Status: Abnormal   Collection Time   12/26/11 12:35 PM      Component Value Range Comment   Sodium 134 (*) 135 - 145 (mEq/L)    Potassium 3.3 (*) 3.5 - 5.1 (mEq/L)    Chloride 95 (*) 96 - 112 (mEq/L)    CO2 30  19 - 32 (mEq/L)    Glucose, Bld 111 (*) 70 - 99 (mg/dL)    BUN 10  6 - 23 (mg/dL)    Creatinine, Ser 4.09  0.50 - 1.35 (mg/dL)    Calcium 9.5  8.4 - 10.5 (mg/dL)    Total Protein 7.6  6.0 - 8.3 (g/dL)    Albumin 3.4 (*) 3.5 - 5.2 (g/dL)    AST 11  0 - 37 (U/L)    ALT 7  0 - 53 (U/L)    Alkaline Phosphatase 67  39 - 117 (U/L)    Total Bilirubin 0.2 (*) 0.3 - 1.2 (mg/dL)    GFR calc non Af Amer >90  >90 (mL/min)    GFR calc Af Amer >90  >90 (mL/min)   CBC     Status: Abnormal   Collection Time   12/26/11 12:35 PM      Component Value Range Comment   WBC 3.0 (*) 4.0 - 10.5 (K/uL)    RBC 2.97 (*) 4.22 - 5.81 (MIL/uL)    Hemoglobin 8.7 (*) 13.0 - 17.0 (g/dL)    HCT 81.1 (*) 91.4 - 52.0 (%)    MCV 90.6  78.0 - 100.0 (fL)    MCH 29.3  26.0 - 34.0 (pg)    MCHC 32.3  30.0 - 36.0 (g/dL)    RDW 78.2  95.6 - 21.3 (%)    Platelets 223  150 - 400 (K/uL)   DIFFERENTIAL     Status: Abnormal   Collection Time   12/26/11 12:35 PM      Component Value Range Comment   Neutrophils Relative 68  43 - 77 (%)    Neutro Abs 2.1  1.7 - 7.7 (K/uL)    Lymphocytes Relative 14  12 - 46 (%)    Lymphs Abs 0.4 (*) 0.7 - 4.0 (K/uL)    Monocytes Relative 16 (*) 3 - 12 (%)    Monocytes Absolute 0.5  0.1 - 1.0 (K/uL)    Eosinophils Relative 2  0 - 5 (%)    Eosinophils Absolute 0.1  0.0 - 0.7 (K/uL)    Basophils Relative 0  0 - 1 (%)    Basophils Absolute 0.0  0.0 - 0.1 (K/uL)   CULTURE, BLOOD (ROUTINE X 2)     Status: Normal (Preliminary result)   Collection Time   12/26/11 12:35 PM      Component Value Range Comment   Specimen Description BLOOD LEFT ARM      Special Requests BOTTLES DRAWN AEROBIC AND ANAEROBIC 10CC      Culture  Setup Time [id number]      Culture        Value:        BLOOD CULTURE RECEIVED NO GROWTH TO DATE CULTURE WILL BE HELD FOR 5 DAYS BEFORE ISSUING A FINAL NEGATIVE REPORT   Report Status PENDING     CULTURE, BLOOD (ROUTINE X 2)     Status: Normal (Preliminary result)   Collection Time   12/26/11 12:39 PM      Component Value Range Comment   Specimen Description BLOOD LEFT ARM      Special Requests BOTTLES DRAWN AEROBIC AND ANAEROBIC 10CC      Culture  Setup Time [id number]       Culture        Value:        BLOOD CULTURE RECEIVED NO GROWTH TO DATE CULTURE WILL BE HELD FOR 5 DAYS BEFORE ISSUING A FINAL NEGATIVE REPORT   Report Status PENDING     URINALYSIS, ROUTINE W REFLEX MICROSCOPIC     Status: Normal   Collection Time   12/26/11  2:35 PM      Component Value Range Comment   Color, Urine YELLOW  YELLOW     APPearance CLEAR  CLEAR     Specific Gravity, Urine 1.018  1.005 - 1.030     pH 7.5  5.0 - 8.0     Glucose, UA NEGATIVE  NEGATIVE (mg/dL)    Hgb urine dipstick NEGATIVE  NEGATIVE     Bilirubin Urine NEGATIVE  NEGATIVE     Ketones, ur NEGATIVE  NEGATIVE (mg/dL)    Protein, ur NEGATIVE  NEGATIVE (mg/dL)    Urobilinogen, UA 0.2  0.0 - 1.0 (mg/dL)    Nitrite NEGATIVE  NEGATIVE     Leukocytes, UA NEGATIVE  NEGATIVE  MICROSCOPIC NOT DONE ON URINES WITH NEGATIVE PROTEIN, BLOOD, LEUKOCYTES, NITRITE, OR GLUCOSE <1000 mg/dL.  WOUND CULTURE     Status: Normal (Preliminary result)   Collection Time   12/26/11  6:38 PM      Component Value Range Comment   Specimen Description NECK      Special Requests NONE      Gram Stain        Value: RARE WBC PRESENT, PREDOMINANTLY MONONUCLEAR     NO SQUAMOUS EPITHELIAL CELLS SEEN     NO ORGANISMS SEEN   Culture NO GROWTH 1 DAY      Report Status PENDING     CBC     Status: Abnormal   Collection Time   12/27/11  3:50 AM      Component Value Range Comment  WBC 3.1 (*) 4.0 - 10.5 (K/uL)    RBC 2.50 (*) 4.22 - 5.81 (MIL/uL)    Hemoglobin 7.5 (*) 13.0 - 17.0 (g/dL)    HCT 16.1 (*) 09.6 - 52.0 (%)    MCV 92.4  78.0 - 100.0 (fL)    MCH 30.0  26.0 - 34.0 (pg)    MCHC 32.5  30.0 - 36.0 (g/dL)    RDW 04.5  40.9 - 81.1 (%)    Platelets 213  150 - 400 (K/uL)   BASIC METABOLIC PANEL     Status: Normal   Collection Time   12/27/11  3:50 AM      Component Value Range Comment   Sodium 136  135 - 145 (mEq/L)    Potassium 3.5  3.5 - 5.1 (mEq/L)    Chloride 100  96 - 112 (mEq/L)    CO2 28  19 - 32 (mEq/L)    Glucose, Bld 97  70 - 99  (mg/dL)    BUN 8  6 - 23 (mg/dL)    Creatinine, Ser 9.14  0.50 - 1.35 (mg/dL)    Calcium 9.2  8.4 - 10.5 (mg/dL)    GFR calc non Af Amer >90  >90 (mL/min)    GFR calc Af Amer >90  >90 (mL/min)   VANCOMYCIN, TROUGH     Status: Normal   Collection Time   12/27/11  9:43 PM      Component Value Range Comment   Vancomycin Tr 13.2  10.0 - 20.0 (ug/mL)   CBC     Status: Abnormal   Collection Time   12/28/11  3:50 AM      Component Value Range Comment   WBC 2.9 (*) 4.0 - 10.5 (K/uL)    RBC 2.81 (*) 4.22 - 5.81 (MIL/uL)    Hemoglobin 8.3 (*) 13.0 - 17.0 (g/dL)    HCT 78.2 (*) 95.6 - 52.0 (%)    MCV 92.9  78.0 - 100.0 (fL)    MCH 29.5  26.0 - 34.0 (pg)    MCHC 31.8  30.0 - 36.0 (g/dL)    RDW 21.3  08.6 - 57.8 (%)    Platelets 240  150 - 400 (K/uL)   BASIC METABOLIC PANEL     Status: Normal   Collection Time   12/28/11  3:50 AM      Component Value Range Comment   Sodium 139  135 - 145 (mEq/L)    Potassium 3.9  3.5 - 5.1 (mEq/L)    Chloride 101  96 - 112 (mEq/L)    CO2 29  19 - 32 (mEq/L)    Glucose, Bld 86  70 - 99 (mg/dL)    BUN 10  6 - 23 (mg/dL)    Creatinine, Ser 4.69  0.50 - 1.35 (mg/dL)    Calcium 9.3  8.4 - 10.5 (mg/dL)    GFR calc non Af Amer >90  >90 (mL/min)    GFR calc Af Amer >90  >90 (mL/min)    Dg Chest 2 View  12/26/2011  *RADIOLOGY REPORT*  Clinical Data: History of head and neck cancer.  CHEST - 2 VIEW  Comparison: Plain films of the chest 04/17/2008 and and 07/26/2011. PET CT scan 07/25/2011.  Findings: Surgical clips in the left axilla and left neck are noted.  Lungs appear clear.  Heart size is normal.  No pneumothorax or pleural effusion.  IMPRESSION: No plain film evidence of acute or metastatic disease.  Original Report Authenticated By: Bernadene Bell. D'ALESSIO,  M.D.    MEDICATIONS: reviewed.     Assessment/Plan:   1. Neutropenic fever:  - From chemo. Source: Flap abscess.  - Fever work up:  PA/lat CXR was negative; wound culture; blood culture, urine culture  all negative to date.  - Empiric antibiotics:  As cultures negative; will d/c empiric IV Vancomycin and Cefepime.  I will replace these with oral  Levaquin and convert IV Flagyl to oral Flagy.   2. Flap abscess: I appreciated Dr. Myrtis Hopping assistance with dressing change instruction pending D/C tomorrow.   3. Anemia/leukopenia: Due to chemoradiation. Will monitor CBC and transfuse if active bleeding or Hgb <7.   4. Smoking: none at this time.   5. Calorie/Protein malnutrition: stable weight. He is doing swallowing exercises. Continue with Osmolyte 1.5 about 6 cans/day.   6. Mucositis: mouthrinse with inpatient mouth care solution alternating with diluted H2O2 every 2-3 hours.   7. Nausea/vomiting: He has Compazine/Zofran/Ativan prn.   8. Mouth pain from resection and ongoing chemoradiation: He in on OxyContin long acting and Lortab elixir prn breakthrough pain with IV morphine for more severe pain.   9.  Oral tongue SCC:  S/p resection; s/p chemoradiation with one week left.  Chemoradiation will be on hold until patient healsfrom abscess drainage I&D.  I will probably skip the 3rd and last dose of chemotherapy due to neutropenic fever, abscess as to promote faster healing of the flap abscess.   10.  Dispo:  24 hour on oral antibiotics; plan for D/C tomorrow if afebrile today.    FULL CODE.

## 2011-12-29 ENCOUNTER — Other Ambulatory Visit: Payer: Self-pay | Admitting: Oncology

## 2011-12-29 ENCOUNTER — Ambulatory Visit: Payer: Medicaid Other

## 2011-12-29 ENCOUNTER — Encounter: Payer: Self-pay | Admitting: *Deleted

## 2011-12-29 ENCOUNTER — Encounter: Payer: Self-pay | Admitting: Oncology

## 2011-12-29 ENCOUNTER — Telehealth: Payer: Self-pay | Admitting: Oncology

## 2011-12-29 DIAGNOSIS — L0291 Cutaneous abscess, unspecified: Secondary | ICD-10-CM

## 2011-12-29 DIAGNOSIS — C029 Malignant neoplasm of tongue, unspecified: Secondary | ICD-10-CM

## 2011-12-29 LAB — CBC
Hemoglobin: 7.8 g/dL — ABNORMAL LOW (ref 13.0–17.0)
RBC: 2.7 MIL/uL — ABNORMAL LOW (ref 4.22–5.81)

## 2011-12-29 LAB — WOUND CULTURE

## 2011-12-29 LAB — BASIC METABOLIC PANEL
CO2: 26 mEq/L (ref 19–32)
GFR calc non Af Amer: >90 mL/min (ref >90)
Glucose, Bld: 106 mg/dL — ABNORMAL HIGH (ref 70–99)
Potassium: 3.6 mEq/L (ref 3.5–5.1)
Sodium: 136 mEq/L (ref 135–145)

## 2011-12-29 MED ORDER — OSMOLITE 1.5 CAL PO LIQD
474.0000 mL | Freq: Two times a day (BID) | ORAL | Status: DC
  Filled 2011-12-29 (×2): qty 474

## 2011-12-29 MED ORDER — OXYCODONE HCL 10 MG PO TB12: 20.0000 mg | ORAL_TABLET | Freq: Two times a day (BID) | ORAL | Status: DC

## 2011-12-29 MED ORDER — HYDROCODONE-ACETAMINOPHEN 7.5-500 MG/15ML PO SOLN: 15.0000 mL | ORAL | Status: AC | PRN

## 2011-12-29 MED ORDER — METRONIDAZOLE 500 MG PO TABS: 500.0000 mg | ORAL_TABLET | Freq: Three times a day (TID) | ORAL | Status: AC

## 2011-12-29 MED ORDER — OSMOLITE 1.5 CAL PO LIQD
237.0000 mL | Freq: Two times a day (BID) | ORAL | Status: DC
  Filled 2011-12-29 (×2): qty 237

## 2011-12-29 NOTE — Discharge Summary (Signed)
Physician Discharge Summary   Patient ID: Thomas Bean [id number] y.o. 1960-12-17  Admit date: 12/26/2011  Discharge date and time: 12/29/2011  Admitting Physician: Exie Parody, MD   Discharge Physician: Exie Parody, MD   Admission Diagnoses: NEUTROPENIA,FEVERS  Discharge Diagnoses:  Flap abscess.   Admission Condition: stable   Discharged Condition:  Improved.  Indication for Admission: neutropenic fever, abscess.   Hospital Course:   1. Neutropenic fever:  - From recent chemo.  Source: Flap abscess.  - Fever work up: PA/lat CXR was negative; wound culture; blood culture, urine culture were all negative on discharge.  - Empiric antibiotics were started with Vancomycin and Cefepime per pharmacy protocol.  He still had fever on these antibiotics after day 1 and with confirmed abscess, Flagyl was added.  After 3 days of empiric antibiotics and no growth of cultures, his Vancomycin and Cefepime were changed to oral Levaquin; and his IV Flagyl was switched to oral Flagyl.  He had no fever for 24 hours on oral Levaquin and Flagyl before he was discharged.   2. Flap abscess: Dr. Narda Bonds from ENT evaluated patient and performed I&D with about 20 ml of purulent discharge from the flap over his chin.  He instructed patient on how to care for the dressing change.  The flap edema significantly improved on discharge.   3. Anemia/leukopenia: Due to chemoradiation.  He did not have symptomatic anemia and thus did not require transfusion.  4. Smoking: none at this time.   5. Calorie/Protein malnutrition: stable weight. He performed his swallowing exercises. He was seen by inpatient Nutrition.  He continued with Osmolyte 1.5 about 6 cans/day.   6. Mucositis: mouthrinse with inpatient mouth care solution alternating with diluted H2O2 every 2-3 hours.   7. Nausea/vomiting: He has Compazine/Zofran/Ativan prn.   8. Mouth pain from resection and ongoing chemoradiation: He was  continued on OxyContin long acting and Lortab elixir prn breakthrough pain with IV morphine for more severe pain.  Since he required frequent breakthrough pain med up to 4x/day, I increased OxyContin to 20mg  PO BID upon discharge.  9. Oral tongue SCC: S/p resection; s/p chemoradiation with one week left. He was seen by Dr. Chipper Herb, Rad Onc during this hospital course.  He will resume radiation tentatively on Monday 01/02/2012.  He received 2 out of 3 doses of q3wk Cisplatin.  Given the abscess, leukopenia, I recommended skipping the last dose of chemo to ensure healing of the abscess.  Patient agreed with this plan.     Consults:  ENT, Rad Onc, and Nutrition.    Significant Diagnostic Studies:  CXR:  Negative on admission.   Treatments: I&D of the abscess and antibiotics.   Discharge Exam:  General: well-nourished in no acute distress. Eyes: no scleral icterus. ENT: There was filing defect in left mandible. There was an incisional wound in the chin area with packing gauge.  There was no active bleeding, purulent discharge or pain on palpation.  Lymphatics: Difficult to tell if there was adenopathy due to flap edema. Respiratory: lungs were clear bilaterally without wheezing or crackles. Cardiovascular: Regular rate and rhythm, S1/S2, without murmur, rub or gallop. There was no pedal edema. GI: abdomen was soft, flat, nontender, nondistended, without organomegaly. PEG tube dry/clean/intact. Muscoloskeletal: no spinal tenderness of palpation of vertebral spine. Skin exam was without echymosis, petichae.     Disposition: 01-Home or Self Care  Patient Instructions:  Medication List  As of 12/29/2011  8:32 AM  STOP taking these medications         PRESCRIPTION MEDICATION         TAKE these medications         alum & mag hydroxide-simeth 200-200-20 MG/5ML suspension   Commonly known as: MAALOX/MYLANTA   Take 15 mLs by mouth every 6 (six) hours as needed. For indigestion.       emollient cream   Commonly known as: BIAFINE   Apply 1 application topically as needed. For radiation burns.      HYDROcodone-acetaminophen 7.5-500 MG/15ML solution   Commonly known as: LORTAB   Take 15 mLs by mouth every 6 (six) hours as needed for pain.      HYDROcodone-acetaminophen 7.5-500 MG/15ML solution   Commonly known as: LORTAB   Take 15 mLs by mouth every 4 (four) hours as needed for pain.      ibuprofen 200 MG tablet   Commonly known as: ADVIL,MOTRIN   Take 400 mg by mouth every 6 (six) hours as needed. Pain        levofloxacin 500 MG tablet   Commonly known as: LEVAQUIN   Take 1 tablet (500 mg total) by mouth daily.      loratadine 10 MG tablet   Commonly known as: CLARITIN   Take 10 mg by mouth daily as needed. FOR ALLERGIES        LORazepam 0.5 MG tablet   Commonly known as: ATIVAN   Take 1 tablet (0.5 mg total) by mouth every 6 (six) hours as needed (Nausea or vomiting).      metroNIDAZOLE 500 MG tablet   Commonly known as: FLAGYL   Take 1 tablet (500 mg total) by mouth every 8 (eight) hours.      ondansetron 8 MG tablet   Commonly known as: ZOFRAN   Take 1 tablet two times a day as needed for nausea or vomiting starting on the third day after chemotherapy.      oxyCODONE 10 MG 12 hr tablet   Commonly known as: OXYCONTIN   Take 2 tablets (20 mg total) by mouth every 12 (twelve) hours.      prochlorperazine 10 MG tablet   Commonly known as: COMPAZINE   Take 1 tablet (10 mg total) by mouth every 6 (six) hours as needed (Nausea or vomiting).           Activity: ad lib  Diet:  Water orally prn.  Nutrition via PEG:  Osmolyte 1.5 with 6 cans/day.   Flush PEG tube with water before and after each feeding.  Total fluid amount daily is about 2 liter.   Wound Care:  Packing change in the flap abscess per Dr. Allene Pyo instruction.  Change PEG tube dressing change daily.    Follow-up with Dr. Dayton Scrape (Rad Onc) Mon 01/02/2012 at 10am.  Dr. Ezzard Standing (ENT) Wed  01/04/2012 (call his office for exact time; [phone]).   Dr. Gaylyn Rong (Med Onc):  In about 2 wks (call his office for exact time and date; [phone])  Signed: Zaccary Creech 12/29/2011 8:32 AM

## 2011-12-29 NOTE — Consult Note (Signed)
Doing well afebrile. 3 cm of packing  Removed from abscess and patient instructed on care. Will follow up with me next Wed

## 2011-12-29 NOTE — Progress Notes (Signed)
Patient discharged home, all discharge medications and instructions reviewed and questions answered. Pt to be assisted to vehicle by wheelchair when ride arrives.

## 2011-12-29 NOTE — Telephone Encounter (Signed)
S/w the pt and he is aware of his r/s appts from 12/30/2011 to 01/13/2012 with dr ha and that his chemo has been cax on 01/02/2012 per md orders. Pt has finished chemo

## 2011-12-30 ENCOUNTER — Other Ambulatory Visit: Payer: Self-pay | Admitting: Lab

## 2011-12-30 ENCOUNTER — Ambulatory Visit: Payer: Medicaid Other

## 2011-12-30 ENCOUNTER — Ambulatory Visit: Payer: Self-pay | Admitting: Oncology

## 2012-01-01 LAB — CULTURE, BLOOD (ROUTINE X 2)
Culture  Setup Time: 201304151451
Culture: NO GROWTH
Culture: NO GROWTH

## 2012-01-02 ENCOUNTER — Encounter: Payer: Self-pay | Admitting: Radiation Oncology

## 2012-01-02 ENCOUNTER — Telehealth: Payer: Self-pay | Admitting: Oncology

## 2012-01-02 ENCOUNTER — Ambulatory Visit
Admission: RE | Admit: 2012-01-02 | Discharge: 2012-01-02 | Disposition: A | Discharge status: Home / Self Care | Payer: Medicaid Other | Source: Ambulatory Visit | Attending: Radiation Oncology | Admitting: Radiation Oncology

## 2012-01-02 ENCOUNTER — Ambulatory Visit: Payer: Self-pay

## 2012-01-02 VITALS — BP 113/71 | HR 90 | Resp 18 | Wt 173.9 lb

## 2012-01-02 DIAGNOSIS — C029 Malignant neoplasm of tongue, unspecified: Secondary | ICD-10-CM

## 2012-01-02 NOTE — Progress Notes (Signed)
Patient presents to the clinic today accompanied by his father for a follow up to hospitalization prior to treatment. Patient is alert and oriented to person, place, and time. No distress noted. Steady gait noted. Pleasant affect noted. Patient reports left lower jaw pain numb to aching 3 on a scale of 0-10 with lortab and oxycontin on board. Patient reports taking in six cans per day of osmolite or ensure. Weight stable. Dry flaking skin of treated area noted. Patient using Biafine as directed. Dry intact gauze dressing noted under chin on anterior neck. Reported all findings to Dr. Dayton Scrape.

## 2012-01-02 NOTE — Progress Notes (Signed)
Weekly Management Note:  Site:L oral cavity/neck Current Dose:  6000  cGy Projected Dose: 6600  cGy  Narrative: The patient is seen today for routine under treatment assessment. CBCT/MVCT images/port films were reviewed. The chart was reviewed.   [name] was discharged in the hospital late last week. He remains afebrile. He finished his Levaquin today and he continues with his Flagyl. He's been removing 1-2 inches of cause a day from his submental wound. He generally feels well. He is somewhat bothered by your to the tissue along his left posterior cheek. He believes that this is related to your dictation from his left upper posterior molar. He continues with his Lortab and OxyContin. He is taking 6 cans of Osmolite or Ensure a day.  Physical Examination:  Filed Vitals:   01/02/12 1015  BP: 113/71  Pulse: 90  Resp: 18  .  Weight: 173 lb 14.4 oz (78.881 kg). There is less erythema along his left neck and submental region. There is no drainage expressed on palpation of the tissues along the submental region and neck. On inspection oral cavity there is a 1/2-2 cm area of redundant tissue only anterior left retromolar trigone region which is in alignment with his most posterior left upper molar. This tissue is firm but not ulcerated.  Impression: Satisfactory progress, more symptomatic from what I believe to be irritation of redundant tissue along the anterior left retromolar trigone. I This represents disease progression with ongoing radiation therapy.  Plan: Continue radiation therapy as planned. He should finish his treatment this Thursday. He'll see Dr. Ezzard Standing this Wednesday.

## 2012-01-02 NOTE — Telephone Encounter (Signed)
called pt and scheduled appt with barb neff for 04/26

## 2012-01-03 ENCOUNTER — Ambulatory Visit
Admission: RE | Admit: 2012-01-03 | Discharge: 2012-01-03 | Disposition: A | Discharge status: Home / Self Care | Payer: Medicaid Other | Source: Ambulatory Visit | Attending: Radiation Oncology | Admitting: Radiation Oncology

## 2012-01-04 ENCOUNTER — Ambulatory Visit
Admission: RE | Admit: 2012-01-04 | Discharge: 2012-01-04 | Disposition: A | Discharge status: Home / Self Care | Payer: Medicaid Other | Source: Ambulatory Visit | Attending: Radiation Oncology | Admitting: Radiation Oncology

## 2012-01-05 ENCOUNTER — Encounter: Payer: Self-pay | Admitting: Radiation Oncology

## 2012-01-05 ENCOUNTER — Ambulatory Visit
Admission: RE | Admit: 2012-01-05 | Discharge: 2012-01-05 | Disposition: A | Discharge status: Home / Self Care | Payer: Medicaid Other | Source: Ambulatory Visit | Attending: Radiation Oncology | Admitting: Radiation Oncology

## 2012-01-05 VITALS — BP 118/77 | HR 78 | Resp 18 | Wt 172.1 lb

## 2012-01-05 DIAGNOSIS — C029 Malignant neoplasm of tongue, unspecified: Secondary | ICD-10-CM

## 2012-01-05 NOTE — Progress Notes (Signed)
[facility] Radiation Oncology End of Treatment Note  Name:Christopher BONNER LARUE  Date: 01/05/2012 ZOX:[id number] DOB:1961-07-25   Status:outpatient    CC: [name], MD, MD  Dr. Narda Bonds, Dr. Jacqlyn Krauss  REFERRING PHYSICIAN: Dr. 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   INDICATION FOR TREATMENT: Curative, postoperative   TREATMENT DATES: 11/15/2011 through 01/05/2012                          SITE/DOSE:     Left neck/left oral cavity: High-risk tumor bed PTV/positive margin 6600 cGy 33 sessions, medium high-risk tumor bed 5775 cGy 33 sessions to medium wrist tumor bed 5610 cGy 33 sessions and low risk tumor bed/neck 5004 and 45 cGy 33 sessions.   Total elapsed time for treatment was 51 days of a planned ideal duration of 45 days.              BEAMS/ENERGY: 6 MV photons helical IMRT Tomotherapy. Treatment given with Welton Flakes, chemotherapy under the direction of Dr. Gaylyn Rong.                  NARRATIVE:    Mr. Doylene Canning tolerated treatment reasonably well, however he required a one week break during his sixth week of therapy  because of hospitalization for neutropenic fever and development of a upper anterior neck abscess which was drained by Dr. Ezzard Standing. Cultures remained negative. He was treated with IV antibiotics and resumed his radiation therapy after a 9 day break.   There can be expected a delay in wound healing because of his postoperative radiation therapy. He lost only 4 pounds during his course of radiation therapy.                 PLAN: Routine followup in one month. Patient instructed to call if questions or worsening complaints in interim.

## 2012-01-05 NOTE — Progress Notes (Signed)
Patient presents to the clinic today unaccompanied for PUT with Dr. Dayton Scrape following final treatment. Patient is alert and oriented to person, place, and time. No distress noted. Steady gait noted. Pleasant affect noted. Patient reports left jaw/sugical site and under the tongue pain 3 on a scale of 0-10. Patient reports that dry mouth and thick salvia continue. Patient reports using Biotene and peroxide rinse to aid in relieve these side effects. Patient reports taste changes. Patient schedule to Zenovia Jarred, RD tomorrow. Patient saw Dr. Ezzard Standing yesterday. Dr. Ezzard Standing started him on cephalexin. Patient was given the green light to begin taking things by mouth yesterday since "they have decided there is no hole in his mouth draining and creating these abscesses." Reported all findings to Dr. Dayton Scrape. Provided patient with a follow up appointment card for one month out. Encouraged patient to contact staff with needs. Patient verbalized understanding of all things reviewed.

## 2012-01-05 NOTE — Progress Notes (Signed)
Weekly Management Note:  Site:L oral cavity/neck Current Dose:  6600  cGy Projected Dose: 6600  cGy  Narrative: The patient is seen today for routine under treatment assessment. CBCT/MVCT images/port films were reviewed. The chart was reviewed.   He is without new complaints today. He is having less submental drainage. He continues to remove a few inches of packing each day. His weight remained stable.  Physical Examination:  Filed Vitals:   01/05/12 1052  BP: 118/77  Pulse: 78  Resp: 18  .  Weight: 172 lb 1.6 oz (78.064 kg). There is packing seen along his submental with with a minimal amount of purulent drainage. There is no surrounding cellulitis or evidence for recurrent disease/adenopathy.  Impression: Satisfactory progress. Radiation therapy completed but with delay.  Plan: Followup visit in one month.

## 2012-01-06 ENCOUNTER — Ambulatory Visit: Payer: Self-pay | Admitting: Nutrition

## 2012-01-06 NOTE — Progress Notes (Signed)
Thomas Bean presents to nutrition follow-up.  Weight was documented as 172.1 pounds which has been a gradual decrease.  Usual body weight was 199 pounds.  The patient has lost approximately 26.9 pounds from his usual body weight during his course of treatment.  This is a 14% weight loss.  The patient reports that he is on antibiotics secondary to his abscess, and this is causing his stomach to be upset and giving him diarrhea occasionally.  He is not having any trouble tolerating tube feedings.  He states he has changed his tube feeding to Osmolite 1.5 instead of Ensure Plus and consumes 5 daily along with 2 scoops of protein powder and 2 CIB.  He does complain of taste alterations and food tasting awful to him.  NUTRITION DIAGNOSIS:  Inadequate oral intake continues.  INTERVENTION:  I have encouraged the patient to change tube feeding regimen to 6 cans of Osmolite 1.5 daily spread out as tolerated to 4-5 feedings.  In addition, he is to mix 1 scoop of his whey protein powder with a packet of Carnation Instant Breakfast in 8 ounces of water and give this bolus via his feeding tube b.i.d.  He is to continue free water flushes of 350 mL of water 6 times a day.  This provides 2590 calories, 139 g protein and 3186 mL of free water.  It should provide adequate nutrients to promote continued healing and minimize further weight loss.  I have also given him information on infused water to try orally as well as other strategies for dealing with taste alterations.  MONITORING/EVALUATION/GOALS:  The patient continues to tolerate tube feeding, but has been unable to meet minimum estimated needs which has resulted in weight loss.  He will tolerate new tube feeding regimen to promote adequate nutrients for healing and minimize any further weight loss.  NEXT VISIT:  Wednesday, May 22nd prior to his physician followup.    ______________________________ Zenovia Jarred, RD, LDN Clinical Nutrition Specialist BN/MEDQ   D:  01/06/2012  T:  01/06/2012  Job:  [id number]

## 2012-01-13 ENCOUNTER — Other Ambulatory Visit (HOSPITAL_BASED_OUTPATIENT_CLINIC_OR_DEPARTMENT_OTHER): Payer: Medicaid Other | Admitting: Lab

## 2012-01-13 ENCOUNTER — Encounter: Payer: Self-pay | Admitting: Oncology

## 2012-01-13 ENCOUNTER — Ambulatory Visit (HOSPITAL_BASED_OUTPATIENT_CLINIC_OR_DEPARTMENT_OTHER): Payer: Medicaid Other | Admitting: Oncology

## 2012-01-13 VITALS — BP 124/78 | HR 79 | Temp 98.2°F | Ht 72.0 in | Wt 173.1 lb

## 2012-01-13 DIAGNOSIS — C029 Malignant neoplasm of tongue, unspecified: Secondary | ICD-10-CM

## 2012-01-13 DIAGNOSIS — D649 Anemia, unspecified: Secondary | ICD-10-CM

## 2012-01-13 DIAGNOSIS — E46 Unspecified protein-calorie malnutrition: Secondary | ICD-10-CM

## 2012-01-13 DIAGNOSIS — D72819 Decreased white blood cell count, unspecified: Secondary | ICD-10-CM

## 2012-01-13 DIAGNOSIS — L0291 Cutaneous abscess, unspecified: Secondary | ICD-10-CM

## 2012-01-13 LAB — COMPREHENSIVE METABOLIC PANEL
Albumin: 4 g/dL (ref 3.5–5.2)
CO2: 31 mEq/L (ref 19–32)
Calcium: 9.4 mg/dL (ref 8.4–10.5)
Chloride: 101 mEq/L (ref 96–112)
Glucose, Bld: 119 mg/dL — ABNORMAL HIGH (ref 70–99)
Potassium: 4.2 mEq/L (ref 3.5–5.3)
Sodium: 139 mEq/L (ref 135–145)
Total Bilirubin: 0.3 mg/dL (ref 0.3–1.2)
Total Protein: 6.5 g/dL (ref 6.0–8.3)

## 2012-01-13 LAB — CBC WITH DIFFERENTIAL/PLATELET
Basophils Absolute: 0 10*3/uL (ref 0.0–0.1)
Eosinophils Absolute: 0 10*3/uL (ref 0.0–0.5)
HGB: 9.6 g/dL — ABNORMAL LOW (ref 13.0–17.1)
MCV: 91.6 fL (ref 79.3–98.0)
MONO%: 12.6 % (ref 0.0–14.0)
NEUT#: 2.1 10*3/uL (ref 1.5–6.5)
RDW: 16.8 % — ABNORMAL HIGH (ref 11.0–14.6)

## 2012-01-13 NOTE — Progress Notes (Signed)
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    PAST THERAPY:  1. 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  He developed recurrence in the left submandibular gland in August 2012.  He underwent on 07/27/2011 radical left neck dissection by Dr. Ezzard Standing and Dr. Geryl Rankins. Pathology case number [id number] showed total 15 lymph nodes from left radical neck dissection that was all negative. However the left submandibular gland mass showed a 3.5 cm invasive SCC, moderately differentiates involving the adjacent skeletal muscle tissue and bone a tissue and extending into the inked margin. There was no evidence of any lymphatic invasion or perineural invasion.  He underwent further resection by Dr. Mary Sella at [facility] on 09/29/2011.   2. He received adjuvant chemoradiation therapy with q 3 week Cisplatin given 11/15/11-12/05/11 (received 2 of 3 doses; 3rd dose held due to febrile neutropenia and development of flap abscess). He received concurrent XRT  11/15/11 through 01/05/12.  CURRENT THERAPY:  Watchful observation  INTERIM HISTORY:  Thomas Bean returns to clinic today for regular follow up. He was hospitalized in April for febrile neutropenia and development of a flap abscess. He was treated with IV antibiotics initially then discharged home on oral Levaquin and Flagyl. He had an I&D of the abscess while hospitalized. He has ben afebrile since hospital discharge. He still has moderate pain to his flap. No drainage noted. Less swelling. He takes OxyContin twice a day and is using Lortab elixir about 1-2 times per day now. He is able to swallow his pills and drink small amounts of water. Still taking all of his nutrition through PEG tube. He averages about 6 cans of Osmolite daily.Weight up by 1 pound  since 01/05/12.   He denies productive cough, dysuria, pyuria, cellulitis.  He does mouth rinses about every 3 hours.  Still has no taste. The PEG tube is without pain/discharge/erythema. His tinnitus is stable.  He does have some muffled hearing; but this is improving.   Patient denies fatigue, headache, visual changes, confusion, drenching night sweats, palpable lymph node swelling, mucositis, odynophagia, dysphagia, nausea vomiting, jaundice, chest pain, palpitation, shortness of breath, dyspnea on exertion, productive cough, gum bleeding, epistaxis, hematemesis, hemoptysis, abdominal pain, abdominal swelling, early satiety, melena, hematochezia, hematuria, skin rash, spontaneous bleeding, joint swelling, joint pain, heat or cold intolerance, bowel bladder incontinence, back pain, focal motor weakness, paresthesia, depression, suicidal or homocidal ideation, feeling hopelessness.   MEDICAL HISTORY: Past Medical History  Diagnosis Date  . Hemorrhoids   . PONV (postoperative nausea and vomiting)   . Hypertension     no medications at present  . GERD (gastroesophageal reflux disease)     no medication  . Depression     treated approx 12 years ago, no treatment at this time  . Hiatal hernia   . Cancer     left side of tongue T1 lesion excised 4/12  . Neutropenia 12/23/2011  . Abscess 12/27/2011    SURGICAL HISTORY:  Past Surgical History  Procedure Date  . Inguinal herniorrhapy   . Tonsillectomy and adenoidectomy   . Excision of tongue mass     April 2012  . Hernia repair   . Neck mass excision 07/27/2011  . Radical neck dissection 07/27/2011    Procedure: RADICAL NECK DISSECTION;  Surgeon: Drema Halon,  MD;  Location: [facility] OR;  Service: ENT;  Laterality: Left;  Marland Kitchen Mandibulectomy 09/2011    and scapular free flap    MEDICATIONS: Current Outpatient Prescriptions  Medication Sig Dispense Refill  . cephALEXin (KEFLEX) 500 MG capsule Take 500 mg by mouth 2 (two) times daily.        Marland Kitchen emollient (BIAFINE) cream Apply 1 application topically as needed. For radiation burns.      Marland Kitchen ibuprofen (ADVIL,MOTRIN) 200 MG tablet Take 400 mg by mouth every 6 (six) hours as needed. Pain       . LORazepam (ATIVAN) 0.5 MG tablet Take 1 tablet (0.5 mg total) by mouth every 6 (six) hours as needed (Nausea or vomiting).  90 tablet  0  . ondansetron (ZOFRAN) 8 MG tablet Take 1 tablet two times a day as needed for nausea or vomiting starting on the third day after chemotherapy.  30 tablet  1  . oxyCODONE (OXYCONTIN) 10 MG 12 hr tablet Take 2 tablets (20 mg total) by mouth every 12 (twelve) hours.  60 tablet  0  . prochlorperazine (COMPAZINE) 10 MG tablet Take 1 tablet (10 mg total) by mouth every 6 (six) hours as needed (Nausea or vomiting).  30 tablet  1  . alum & mag hydroxide-simeth (MAALOX/MYLANTA) 200-200-20 MG/5ML suspension Take 15 mLs by mouth every 6 (six) hours as needed. For indigestion.      Marland Kitchen loratadine (CLARITIN) 10 MG tablet Take 10 mg by mouth daily as needed. FOR ALLERGIES         ALLERGIES:  is allergic to codeine phosphate and tape.  REVIEW OF SYSTEMS:  The rest of the 14-point review of system was negative.   Filed Vitals:   01/13/12 1223  BP: 124/78  Pulse: 79  Temp: 98.2 F (36.8 C)   Wt Readings from Last 3 Encounters:  01/13/12 173 lb 1.6 oz (78.518 kg)  01/05/12 172 lb 1.6 oz (78.064 kg)  01/02/12 173 lb 14.4 oz (78.881 kg)   ECOG Performance status: 1  PHYSICAL EXAMINATION:   General:  well-nourished in no acute distress.  Eyes:  no scleral icterus.  ENT:  There was filing defect in left mandible.  External flap in left mandible from left axilla. The flap is still edematous (but stable compared to prior to start of chemo), without purulent discharge, erythema, or pain on palpation.  Trach stoma is now closed with no purulent discharge.  Lymphatics:  Negative cervical, supraclavicular or axillary adenopathy.  Respiratory: lungs were clear bilaterally without  wheezing or crackles.  Cardiovascular:  Regular rate and rhythm, S1/S2, without murmur, rub or gallop.  There was no pedal edema.  GI:  abdomen was soft, flat, nontender, nondistended, without organomegaly. PEG tube dry/clean/intact.  Muscoloskeletal:  no spinal tenderness of palpation of vertebral spine.  Skin exam was without echymosis, petichae.  Left axilla site where the flap was harvested has staples in place with no erythema, purulent discharge or pain on palpation.    Patient was able to get on and off exam table without assistance.  Gait was normal.  Patient was alerted and oriented.  Attention was good.   Language was appropriate.  Mood was normal without depression.  Speech was not pressured.  Thought content was not tangential.     LABORATORY/RADIOLOGY DATA:  Lab Results  Component Value Date   WBC 3.2* 01/13/2012   HGB 9.6* 01/13/2012   HCT 29.1* 01/13/2012   PLT 256 01/13/2012   GLUCOSE 106* 12/29/2011  CHOL 160 02/14/2008   TRIG 442* 02/14/2008   HDL 26.5* 02/14/2008   LDLDIRECT 64.8 02/14/2008   ALT 7 12/26/2011   AST 11 12/26/2011   NA 136 12/29/2011   K 3.6 12/29/2011   CL 101 12/29/2011   CREATININE 0.53 12/29/2011   BUN 9 12/29/2011   CO2 26 12/29/2011   TSH 2.38 02/14/2008   ASSESSMENT AND PLAN:   1. Smoking: none at this time.  2. Recurrent left lateral tongue squamous cell carcinoma: S/P chemoradiation therapy. He had grade 1 nausea/vomiting, grade 2 tinnitus, grade 1 fatigue, grade 2 neutropenia. He developed febrile neutropenia with flap abscess. Received 2/3 planned doses of Cisplatin. Side effects are slowly improving. Plan to re-evaluate him in about 1 month and then he will have a PET scan in about 4 months then follow-up with Dr Gaylyn Rong thereafter.  3. Leukopenia: WBC remains low, but ANC normal. Likely due to recent chemotherapy and XRT. Anticipate counts will continue to slowly recover now that treatment is complete.  4. Anemia: Hemoglobin is slowly improving. No active bleeding.  No transfusion indicated.   5.  Calorie/Protein malnutrition: stable weight.  He is trying his best to take in oral.  He is doing swallowing exercises. He has a follow-up with Vernell Leep later this month.  6.  Mucositis:  Salt/baking soda mouth rinses alternating with diluted H2O2 every 2-3 hours.   7.  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  This is stable.   8.  Nausea/vomiting:  He has Compazine/Zofran/Ativan prn.   9.  Mouth pain from resection and ongoing chemoradiation:  On Oxycontin and taking Lortab elixir 1-2 times per day. He has requested Hydrocodone tablets due to cost. I have refilled his Hydrocodone today.  10.  Follow up: Lab and RV with me in about 1 month. He will have labs and a PET scan in 3-4 months then be seen by Dr Gaylyn Rong thereafter.  Case reviewed with Dr Gaylyn Rong. The length of time of the face-to-face encounter was 20 minutes. More than 50% of time was spent counseling and coordination of care.

## 2012-01-16 ENCOUNTER — Ambulatory Visit: Payer: Medicaid Other | Attending: Oncology

## 2012-01-16 ENCOUNTER — Ambulatory Visit: Payer: Self-pay

## 2012-01-16 DIAGNOSIS — R1311 Dysphagia, oral phase: Secondary | ICD-10-CM | POA: Insufficient documentation

## 2012-01-16 DIAGNOSIS — IMO0001 Reserved for inherently not codable concepts without codable children: Secondary | ICD-10-CM | POA: Insufficient documentation

## 2012-01-17 ENCOUNTER — Telehealth: Payer: Self-pay | Admitting: Oncology

## 2012-01-17 NOTE — Telephone Encounter (Signed)
called pts home lmovm for appts on 06/03 and petscan on 09/24 and 09/25.  Informed pt that he can pick up appts on 05/22 when he comes in for his appt with Barb neff.

## 2012-01-25 ENCOUNTER — Encounter (HOSPITAL_COMMUNITY): Payer: Self-pay | Admitting: Dentistry

## 2012-01-25 ENCOUNTER — Ambulatory Visit (HOSPITAL_COMMUNITY): Payer: Self-pay | Admitting: Dentistry

## 2012-01-25 VITALS — BP 122/76 | HR 71 | Temp 98.5°F

## 2012-01-25 DIAGNOSIS — R252 Cramp and spasm: Secondary | ICD-10-CM

## 2012-01-25 DIAGNOSIS — R432 Parageusia: Secondary | ICD-10-CM

## 2012-01-25 DIAGNOSIS — M264 Malocclusion, unspecified: Secondary | ICD-10-CM

## 2012-01-25 DIAGNOSIS — R200 Anesthesia of skin: Secondary | ICD-10-CM

## 2012-01-25 DIAGNOSIS — K117 Disturbances of salivary secretion: Secondary | ICD-10-CM

## 2012-01-25 DIAGNOSIS — K1233 Oral mucositis (ulcerative) due to radiation: Secondary | ICD-10-CM

## 2012-01-25 DIAGNOSIS — R439 Unspecified disturbances of smell and taste: Secondary | ICD-10-CM

## 2012-01-25 NOTE — Progress Notes (Signed)
Wednesday, Jan 25, 2012   BP: 122/ 76                 P: 71             T: 98.5            Wgt: 170 lbs now. Patient was 180 lbs at start of treatments.  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 Patient had selective extractions with alveoloplasty on 08/31/2011. This was with Dr. Cherly Anderson. Patient subsequently underwent surgical resection with reconstruction involving plating of the left mandible after the partial mandibulectomy. Patient then underwent chemoradiation therapy from 11/15/2011 through 01/05/2012.  Patient now presents for a periodic oral examination after chemoradiation therapy.     REVIEW OF CHIEF COMPLAINTS: DRY MOUTH:  Yes, very dry. HARD TO SWALLOW: No HURT TO SWALLOW: No TASTE CHANGES: Taste is still gone. SORES IN MOUTH: Yes, under his tongue. TRISMUS SYMPTOMS: Having a little problem with trismus, but he is doing the trismus exercises. SYMPTOM RELIEF:  Using salt water and baking soda mix,  Biotene Rinses, and hydrogen peroxide diluted in water. HOME ORAL HYGIENE REGIMEN:  BRUSHING: 2X a day FLOSSING: 1X a day FLUORIDE: Doing fluoride at night and having no problems. TRISMUS EXERCISES: Maximim interincisal opening: 30mm.   DENTAL EXAM: ORAL HYGIENE(PLAQUE):Very good oral hygiene. LOCATION OF MUCOSITIS: Left floor of mouth and tongue. DESCRIPTION OF SALIVA: Decreased, foamy saliva. Moderate xerostomia. ANY EXPOSED BONE: None noted. OTHER WATCHED AREAS: Left tongue and floor of mouth after surgical resection and reconstruction.  DIAGNOSES: 1.  Xerostomia  2.  Dysgeusia 3.  Mucositis 4.  Trismus 5.  Malocclusion 6.  Numbness of left face/chin after surgical resection/reconstruction.   RECOMMENDATIONS: 1. Brush after meals and at bedtime. Use fluoride at bedtime. Consider Rx for Prevident 5000 Plus. 2. Use trismus exercises as directed. 3. Use Biotene Rinse or salt water/baking soda  rinses. 4. Multiple sips of water as needed. 5. Follow up with primary Dentist-Dr. Adria Devon in two months for recall exam and cleaning and evaluation of occlusion for occlusal adjustment. Call if problems before then. 6. Follow up with Dr. Dayton Scrape for discussion of use of Salagen or Evoxac as indicated. Dr. Cindra Eves

## 2012-01-27 ENCOUNTER — Encounter (HOSPITAL_COMMUNITY): Payer: Self-pay | Admitting: Dentistry

## 2012-01-31 ENCOUNTER — Encounter: Payer: Self-pay | Admitting: Radiation Oncology

## 2012-02-01 ENCOUNTER — Ambulatory Visit: Payer: Medicaid Other | Admitting: Nutrition

## 2012-02-01 ENCOUNTER — Encounter: Payer: Self-pay | Admitting: Radiation Oncology

## 2012-02-01 ENCOUNTER — Ambulatory Visit
Admission: RE | Admit: 2012-02-01 | Discharge: 2012-02-01 | Disposition: A | Discharge status: Home / Self Care | Payer: Medicaid Other | Source: Ambulatory Visit | Attending: Radiation Oncology | Admitting: Radiation Oncology

## 2012-02-01 VITALS — BP 127/89 | HR 64 | Resp 20 | Wt 165.6 lb

## 2012-02-01 DIAGNOSIS — C801 Malignant (primary) neoplasm, unspecified: Secondary | ICD-10-CM

## 2012-02-01 NOTE — Progress Notes (Signed)
Thomas Bean returns for nutrition followup.  He reports he is "bummed out" and he forgets to eat.  He has not been administering bolus feedings in the volume that he needs.  He states he has gotten some bad news from the physicians and he is having a hard time processing that information. Food continues to taste off to him.  He describes it as salty, sulfur tasting or metallic tasting.  Therefore, it is difficult for him to try to find foods that he enjoys.  The patient has lost weight.  Weight was documented as 165.8 pounds which is down from 173.1 pounds on May 3rd.  NUTRITION DIAGNOSIS:  Inadequate oral intake continues.   New nutrition diagnosis:  Less than optimal enteral nutrition related to patient's inability to increase oral intake secondary to taste alteration as evidenced by 4% weight loss in the last 2 and 1/2 weeks.  INTERVENTION:  I have educated the patient on the importance of altering textures, temperature, and taste of foods to increase desire to eat.  I have stressed the importance of adequate nutrition to promote continued healing.  I have encouraged the patient to keep a food journal documenting foods that he has tolerated and I have asked him to try to increase his total nutrition supplements to 6 cans of either Osmolite 1.5 or an Ensure Plus or Boost Plus by mouth in addition to 1 scoop of whey protein powder to provide 100% of his estimated needs.  MONITORING, EVALUATION, AND GOALS:  The patient has been unable to consume oral intake and administer tube feedings to minimize weight loss.  He will work to increase oral intake and tube feedings to meet 100% of estimated needs to promote weight maintenance.  NEXT VISIT:  Wednesday, June 12th.    ______________________________ Zenovia Jarred, RD, LDN Clinical Nutrition Specialist BN/MEDQ  D:  02/01/2012  T:  02/01/2012  Job:  [id number]

## 2012-02-01 NOTE — Progress Notes (Signed)
Pt cont to have fatigue, thick saliva, taste is metallic or bland, very dry mouth.  Tube feedings 4-6 cans/day for his nutrition. Saw nutritionist earlier today.  Keeps mouth moist w/Biotene, salt/baking soda rinses. States mouth is either very dry or he has lots of thick saliva.  Exercising jaw.  Sensitive to hot/cold under tongue and back of throat.

## 2012-02-01 NOTE — Progress Notes (Signed)
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 He continues to slowly improve. He is getting most of his nutrition through his PEG tube, 4-6 cans a day. His weight is down 7 pounds over the past month. He saw Dr. Erroll Luna last week and was given a good report. He'll see Dr. Erroll Luna again on July 15. He'll see Dr. Gaylyn Rong on June 3 and Dr. Gaylyn Rong has him scheduled for a PET scan on September 24. He also saw Dr. Kristin Bruins of dental oncology last week.  Physical examination: Alert and oriented. Vital signs: Wt Readings from Last 3 Encounters:  02/01/12 165 lb 9.6 oz (75.116 kg)  02/01/12 165 lb 12.8 oz (75.206 kg)  01/13/12 173 lb 1.6 oz (78.518 kg)   Temp Readings from Last 3 Encounters:  01/25/12 98.5 F (36.9 C) Oral  01/13/12 98.2 F (36.8 C) Oral  12/29/11 98.1 F (36.7 C) Oral   BP Readings from Last 3 Encounters:  02/01/12 127/89  01/25/12 122/76  01/13/12 124/78   Pulse Readings from Last 3 Encounters:  02/01/12 64  01/25/12 71  01/13/12 79   Nodes: There is no palpable lymphadenopathy in the neck. Surgical changes noted along the left neck. Oral cavity surgical changes noted along the left oral cavity. There is irregularity and  induration along the posterior left floor of mouth which is unchanged.. There is no visible or palpable evidence for recurrent disease along his left oral cavity. Oropharynx unremarkable to inspection. Indirect mirror examination unremarkable.  Impression: Satisfactory progress, but I would encourage him to increase his caloric intake. Followup visits as mentioned above. Followup visit with me in August.

## 2012-02-07 ENCOUNTER — Other Ambulatory Visit: Payer: Self-pay | Admitting: Oncology

## 2012-02-07 ENCOUNTER — Other Ambulatory Visit: Payer: Self-pay | Admitting: *Deleted

## 2012-02-07 NOTE — Telephone Encounter (Signed)
Called in Rx for vicodin from Clenton Pare, NP to [facility] on [address]Marland Kitchen

## 2012-02-13 ENCOUNTER — Other Ambulatory Visit (HOSPITAL_BASED_OUTPATIENT_CLINIC_OR_DEPARTMENT_OTHER): Payer: Medicaid Other | Admitting: Lab

## 2012-02-13 ENCOUNTER — Ambulatory Visit (HOSPITAL_BASED_OUTPATIENT_CLINIC_OR_DEPARTMENT_OTHER): Payer: Medicaid Other | Admitting: Oncology

## 2012-02-13 ENCOUNTER — Encounter: Payer: Self-pay | Admitting: Oncology

## 2012-02-13 VITALS — BP 128/83 | HR 70 | Temp 99.5°F | Ht 72.0 in | Wt 162.4 lb

## 2012-02-13 DIAGNOSIS — C029 Malignant neoplasm of tongue, unspecified: Secondary | ICD-10-CM

## 2012-02-13 DIAGNOSIS — R112 Nausea with vomiting, unspecified: Secondary | ICD-10-CM

## 2012-02-13 DIAGNOSIS — D649 Anemia, unspecified: Secondary | ICD-10-CM

## 2012-02-13 DIAGNOSIS — D72819 Decreased white blood cell count, unspecified: Secondary | ICD-10-CM

## 2012-02-13 DIAGNOSIS — C01 Malignant neoplasm of base of tongue: Secondary | ICD-10-CM

## 2012-02-13 LAB — COMPREHENSIVE METABOLIC PANEL
Albumin: 4.4 g/dL (ref 3.5–5.2)
CO2: 28 mEq/L (ref 19–32)
Chloride: 101 mEq/L (ref 96–112)
Sodium: 139 mEq/L (ref 135–145)

## 2012-02-13 LAB — CBC WITH DIFFERENTIAL/PLATELET
BASO%: 0.8 % (ref 0.0–2.0)
Eosinophils Absolute: 0.1 10*3/uL (ref 0.0–0.5)
MONO#: 0.3 10*3/uL (ref 0.1–0.9)
NEUT#: 2.4 10*3/uL (ref 1.5–6.5)
RBC: 3.36 10*6/uL — ABNORMAL LOW (ref 4.20–5.82)
RDW: 15.9 % — ABNORMAL HIGH (ref 11.0–14.6)
WBC: 3.3 10*3/uL — ABNORMAL LOW (ref 4.0–10.3)
lymph#: 0.6 10*3/uL — ABNORMAL LOW (ref 0.9–3.3)
nRBC: 0 % (ref 0–0)

## 2012-02-13 NOTE — Progress Notes (Signed)
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    PAST THERAPY:  1. 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  He developed recurrence in the left submandibular gland in August 2012.  He underwent on 07/27/2011 radical left neck dissection by Dr. Ezzard Standing and Dr. Geryl Rankins. Pathology case number [id number] showed total 15 lymph nodes from left radical neck dissection that was all negative. However the left submandibular gland mass showed a 3.5 cm invasive SCC, moderately differentiates involving the adjacent skeletal muscle tissue and bone a tissue and extending into the inked margin. There was no evidence of any lymphatic invasion or perineural invasion.  He underwent further resection by Dr. Mary Sella at [facility] on 09/29/2011.   2. He received adjuvant chemoradiation therapy with q 3 week Cisplatin given 11/15/11-12/05/11 (received 2 of 3 doses; 3rd dose held due to febrile neutropenia and development of flap abscess). He received concurrent XRT  11/15/11 through 01/05/12.  CURRENT THERAPY:  Watchful observation  INTERIM HISTORY:  Thomas Bean returns to clinic today for regular follow up. Side effects of treatment are slowly improving; he states fatigue is getting better. Able to swallow small sips of water and can take bites of ice cream. States that he has a dry mouth still. Still taking all of his nutrition through PEG tube. He averages about 6 cans of Osmolite daily. Weight down by about 3 lbs in 1 month.   He denies productive cough, dysuria, pyuria, cellulitis.  Still has no taste. The PEG tube is without pain/discharge/erythema. His tinnitus is stable.  He does have some muffled hearing; but this is improving. Pain improving; now off Oxycontin and take Vicodin about once a day.  Patient  denies fatigue, headache, visual changes, confusion, drenching night sweats, palpable lymph node swelling, mucositis, odynophagia, dysphagia, nausea vomiting, jaundice, chest pain, palpitation, shortness of breath, dyspnea on exertion, productive cough, gum bleeding, epistaxis, hematemesis, hemoptysis, abdominal pain, abdominal swelling, early satiety, melena, hematochezia, hematuria, skin rash, spontaneous bleeding, joint swelling, joint pain, heat or cold intolerance, bowel bladder incontinence, back pain, focal motor weakness, paresthesia, depression, suicidal or homocidal ideation, feeling hopelessness.   MEDICAL HISTORY: Past Medical History  Diagnosis Date  . Hemorrhoids   . PONV (postoperative nausea and vomiting)   . Hypertension     no medications at present  . GERD (gastroesophageal reflux disease)     no medication  . Depression     treated approx 12 years ago, no treatment at this time  . Hiatal hernia   . Cancer 12/2010    left side of tongue T1 lesion excised 4/12  . Neutropenia 12/23/2011  . Abscess 12/27/2011  . Hx of radiation therapy 11/15/11 -01/05/12    left lateral tongue    SURGICAL HISTORY:  Past Surgical History  Procedure Date  . Inguinal herniorrhapy   . Tonsillectomy and adenoidectomy   . Excision of tongue mass     April 2012  . Hernia repair   . Neck mass excision 07/27/2011  . Radical neck dissection 07/27/2011    Procedure: RADICAL NECK DISSECTION;  Surgeon: Drema Halon, MD;  Location: [facility] OR;  Service: ENT;  Laterality: Left;  Marland Kitchen Mandibulectomy 09/2011    and scapular free flap    MEDICATIONS: Current Outpatient Prescriptions  Medication Sig Dispense Refill  .  emollient (BIAFINE) cream Apply 1 application topically as needed. For radiation burns.      . hydrocodone-acetaminophen (LORCET-HD) 5-500 MG per capsule Take 1 capsule by mouth every 6 (six) hours as needed.      Marland Kitchen HYDROcodone-acetaminophen (VICODIN) 5-500 MG per tablet TAKE 1 TABLET  BY MOUTH EVERY 6 HOURS AS NEEDED FOR PAIN  30 tablet  0  . ibuprofen (ADVIL,MOTRIN) 200 MG tablet Take 400 mg by mouth every 6 (six) hours as needed. Pain       . loratadine (CLARITIN) 10 MG tablet Take 10 mg by mouth daily as needed. FOR ALLERGIES         ALLERGIES:  is allergic to codeine phosphate and tape.  REVIEW OF SYSTEMS:  The rest of the 14-point review of system was negative.   Filed Vitals:   02/13/12 1047  BP: 128/83  Pulse: 70  Temp: 99.5 F (37.5 C)   Wt Readings from Last 3 Encounters:  02/13/12 162 lb 6.4 oz (73.664 kg)  02/01/12 165 lb 9.6 oz (75.116 kg)  02/01/12 165 lb 12.8 oz (75.206 kg)   ECOG Performance status: 1  PHYSICAL EXAMINATION:   General:  well-nourished in no acute distress.  Eyes:  no scleral icterus.  ENT:  There was filing defect in left mandible.  External flap in left mandible from left axilla. The flap is still edematous (but stable compared to prior to start of chemo), without purulent discharge, erythema, or pain on palpation.  Trach stoma is now closed with no purulent discharge.  Lymphatics:  Negative cervical, supraclavicular or axillary adenopathy.  Respiratory: lungs were clear bilaterally without wheezing or crackles.  Cardiovascular:  Regular rate and rhythm, S1/S2, without murmur, rub or gallop.  There was no pedal edema.  GI:  abdomen was soft, flat, nontender, nondistended, without organomegaly. PEG tube dry/clean/intact.  Muscoloskeletal:  no spinal tenderness of palpation of vertebral spine.  Skin exam was without echymosis, petichae.  Left axilla site where the flap was harvested with no erythema, purulent discharge or pain on palpation.    Patient was able to get on and off exam table without assistance.  Gait was normal.  Patient was alerted and oriented.  Attention was good.   Language was appropriate.  Mood was normal without depression.  Speech was not pressured.  Thought content was not tangential.     LABORATORY/RADIOLOGY  DATA:  Lab Results  Component Value Date   WBC 3.3* 02/13/2012   HGB 10.5* 02/13/2012   HCT 31.1* 02/13/2012   PLT 172 02/13/2012   GLUCOSE 119* 01/13/2012   CHOL 160 02/14/2008   TRIG 442* 02/14/2008   HDL 26.5* 02/14/2008   LDLDIRECT 64.8 02/14/2008   ALT 9 01/13/2012   AST 16 01/13/2012   NA 139 01/13/2012   K 4.2 01/13/2012   CL 101 01/13/2012   CREATININE 0.74 01/13/2012   BUN 13 01/13/2012   CO2 31 01/13/2012   TSH 2.38 02/14/2008   ASSESSMENT AND PLAN:   1. Smoking: none at this time.  2. Recurrent left lateral tongue squamous cell carcinoma: S/P chemoradiation therapy. He had grade 1 nausea/vomiting, grade 2 tinnitus, grade 1 fatigue, grade 2 neutropenia while on treatment. Side effects slowly improving. He developed febrile neutropenia with flap abscess. Received 2/3 planned doses of Cisplatin. He will have a PET scan in September then follow-up with Dr Gaylyn Rong thereafter.  3. Leukopenia: WBC remains low, but ANC normal. Likely due to recent chemotherapy and XRT. Anticipate counts will  continue to slowly recover now that treatment is complete.  4. Anemia: Hemoglobin is slowly improving. No active bleeding. No transfusion indicated.   5.  Calorie/Protein malnutrition: stable weight.  He is trying his best to take in oral.  He is doing swallowing exercises. He has a follow-up with Vernell Leep later this month and will see ST next month.  6.  Mucositis:  Resolved. Doing mouth rinses PRN.   7.  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  This is stable.   8.  Nausea/vomiting:  He has Compazine/Zofran/Ativan prn.   9.  Mouth pain from resection and ongoing chemoradiation:  Improving. Now off Oxycontin and taking Vicodin about once a day.  10.  Follow up: In September after PET scan.  The length of time of the face-to-face encounter was 15 minutes. More than 50% of time was spent counseling and coordination of care.

## 2012-02-22 ENCOUNTER — Ambulatory Visit: Payer: Medicaid Other | Admitting: Nutrition

## 2012-02-22 NOTE — Progress Notes (Signed)
Mr. Nebel returns for nutrition followup.  His weight has increased slightly to 164 pounds from 162.4 pounds June 3rd, however, is still down from 165.8 pounds on last visit.  Patient continues to struggle with finding foods that he can chew and manipulate.  Foods continue to taste different and often terrible to him.  He reports his food temperature tolerance is improving.  He continues Osmolite 1.5 via his feeding tube, 5-6 cans daily and using 2-3 scoops of a protein powder (20 gm protein per scoop) every day. He complains of uncontrolled pain but is only using ibuprofen.  NUTRITION DIAGNOSIS:  Inadequate oral intake continues. Diagnosis of less than optimal enteral nutrition infusion has improved.  INTERVENTION:  I have again reinforced and educated the patient on strategies for dealing with his current taste alterations.  I have encouraged him to keep a journal of foods he tolerates so that he can see that he is progressing.  I have asked him to please contact his physician about some alternate pain control as I believe this might be impacting his ability to increase his oral intake.  He is to continue 5-6 cans of Osmolite 1.5 via his feeding tube or drink Ensure Plus or Boost Plus by mouth.  He should continue a minimum of 2 scoops of protein powder daily.  If he is unable to consume protein foods by mouth.  He can increase protein powder to 3 scoops maximum per day. 5-6 cans of Osmolite 1.5 + 2 scoops a protein powder provides between 1975 to 2330 calories, 114 - 129 grams of protein and approximately 905 mL of free water. This meets minimum estimated nutrition needs.  Patient will continue foods by mouth to provide additional calories and protein.  MONITORING/EVALUATION (GOALS):  The patient is tolerating some oral intake and continues to tolerate tube feedings to promote some weight gain.  He will continue to work to increase oral intake and tube feedings to meet 100% of his estimated needs to  promote weight maintenance. When oral intake increases, patient can decrease tube feedings.  NEXT VISIT:  Monday July 8th, after his speech therapy appointment.    ______________________________ Zenovia Jarred, RD, LDN Clinical Nutrition Specialist BN/MEDQ  D:  02/22/2012  T:  02/22/2012  Job:  [id number]

## 2012-02-29 ENCOUNTER — Other Ambulatory Visit: Payer: Self-pay | Admitting: Oncology

## 2012-03-02 ENCOUNTER — Other Ambulatory Visit: Payer: Self-pay | Admitting: Oncology

## 2012-03-02 NOTE — Telephone Encounter (Signed)
Call from pt 1.  Requests refill on Hydrocodone and also 2.  Reports new lump at base of his neck x 1 week.   Called in refill per Clenton Pare, NP.   Instructed pt to call his ENT at [facility] for the new lump on his neck.  Pt states has f/u visit there w/ Dr. Manson Passey on 7/15.  Instructed pt to call them and inform of new lump to see if they want to see him sooner.  Pt verbalized understanding.

## 2012-03-12 ENCOUNTER — Other Ambulatory Visit: Payer: Self-pay | Admitting: Oncology

## 2012-03-13 ENCOUNTER — Other Ambulatory Visit: Payer: Self-pay | Admitting: Oncology

## 2012-03-14 ENCOUNTER — Telehealth: Payer: Self-pay | Admitting: *Deleted

## 2012-03-14 DIAGNOSIS — R911 Solitary pulmonary nodule: Secondary | ICD-10-CM

## 2012-03-14 HISTORY — DX: Solitary pulmonary nodule: R91.1

## 2012-03-14 NOTE — Telephone Encounter (Signed)
Patient confirmed over phone on 03-14-2012

## 2012-03-19 ENCOUNTER — Ambulatory Visit: Payer: Medicaid Other

## 2012-03-19 ENCOUNTER — Ambulatory Visit: Payer: Medicaid Other | Attending: Oncology

## 2012-03-19 ENCOUNTER — Ambulatory Visit: Payer: Medicaid Other | Admitting: Nutrition

## 2012-03-19 DIAGNOSIS — R1311 Dysphagia, oral phase: Secondary | ICD-10-CM | POA: Insufficient documentation

## 2012-03-19 DIAGNOSIS — IMO0001 Reserved for inherently not codable concepts without codable children: Secondary | ICD-10-CM | POA: Insufficient documentation

## 2012-03-19 NOTE — Progress Notes (Signed)
Thomas Bean returns for nutrition followup.  Patient reports he "has more cancer". His weight was documented as 162.8 pounds today which is down slightly from 164 pounds on June 12th. He is status post a speech consult.  He states that he really does not have trouble swallowing.  He has difficulty chewing and manipulating food in his mouth secondary to his surgery and the fact that he has no saliva.  He eats soft moist foods like yogurt, ice cream and milkshakes. He tells me he is using 5-6 cans of Osmolite 1.5 via PEG daily with 2 scoops of protein powder providing an additional 40 g of protein.  He flushes his feeding tube with 250-300 mL of free water approximately 4-5 times a day.  He is drinking three 16 ounce water glasses daily by mouth.  NUTRITION DIAGNOSIS:  Inadequate oral intake continues.  Diagnosis of less than optimal enteral nutrition infusion has improved.  INTERVENTION:  I have educated the patient to increase Osmolite 1.5 to 6 cans daily with 2 scoops of protein powder.  He is to continue foods by mouth as tolerated.  He will continue free water as described to meet 100% of his estimated nutrition needs.  Once the patient meets with his physicians and determines his plan of care, he will contact me if needed for future nutrition information.  MONITORING/EVALUATION/GOALS:  The patient continues to tolerate some oral intake and tube feedings, however has continued to have weight loss.  He will increase tube feedings and oral intake to meet 100% of his estimated needs.  NEXT VISIT:  The patient will call me once he meets with his physicians, or sooner if needed.    ______________________________ Zenovia Jarred, RD, LDN Clinical Nutrition Specialist BN/MEDQ  D:  03/19/2012  T:  03/19/2012  Job:  [id number]

## 2012-03-22 ENCOUNTER — Ambulatory Visit (HOSPITAL_BASED_OUTPATIENT_CLINIC_OR_DEPARTMENT_OTHER): Payer: Medicaid Other | Admitting: Oncology

## 2012-03-22 VITALS — BP 131/82 | HR 85 | Temp 96.9°F | Ht 72.0 in | Wt 160.3 lb

## 2012-03-22 DIAGNOSIS — R911 Solitary pulmonary nodule: Secondary | ICD-10-CM

## 2012-03-22 DIAGNOSIS — C029 Malignant neoplasm of tongue, unspecified: Secondary | ICD-10-CM

## 2012-03-22 DIAGNOSIS — C50919 Malignant neoplasm of unspecified site of unspecified female breast: Secondary | ICD-10-CM

## 2012-03-22 DIAGNOSIS — D649 Anemia, unspecified: Secondary | ICD-10-CM

## 2012-03-22 DIAGNOSIS — C01 Malignant neoplasm of base of tongue: Secondary | ICD-10-CM

## 2012-03-22 MED ORDER — CEVIMELINE HCL 30 MG PO CAPS: 30.0000 mg | ORAL_CAPSULE | Freq: Three times a day (TID) | ORAL | Status: DC

## 2012-03-22 NOTE — Patient Instructions (Addendum)
1.  Recurrence of head/neck cancer: - Restaging scan to make sure cancer has not spread to distal location. - If only local recurrence, consider surgery.   - There is no role for repeat chemoradiation if local recurrence. - If there is distant disease, consider palliative chemotherapy.  This situation would not be curable unfortunately.

## 2012-03-22 NOTE — Progress Notes (Signed)
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   PAST THERAPY:  1. 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 He developed recurrence in the left submandibular gland in August 2012. He underwent on 07/27/2011 radical left neck dissection by Dr. Ezzard Standing and Dr. Geryl Rankins. Pathology case number [id number] showed total 15 lymph nodes from left radical neck dissection that was all negative. However the left submandibular gland mass showed a 3.5 cm invasive SCC, moderately differentiates involving the adjacent skeletal muscle tissue and bone a tissue and extending into the inked margin. There was no evidence of any lymphatic invasion or perineural invasion. He underwent further resection by Dr. Mary Sella at [facility] on 09/29/2011.  2. He received adjuvant chemoradiation therapy with q 3 week Cisplatin given 11/15/11-12/05/11 (received 2 of 3 doses; 3rd dose held due to febrile neutropenia and development of flap abscess). He received concurrent XRT 11/15/11 through 01/05/12.   CURRENT THERAPY: Watchful observation  INTERVAL HISTORY: Thomas Bean 51 y.o. male returns for follow up by himself.  About 3 weeks ago, he noticed a right cervical node level IV.  He underwent US-guided biopsy with Dr. Manson Passey at [facility] with pathology consistent with squamous cell carcinoma.  He underwent restaging CT neck and chest on 03/14/2012.  There was a new 2.5cm necrotic right level 3/4 node.  He also has an 18mm left upper lobe nodule.  The plan from Dr. Manson Passey is to proceed with right cervical neck dissection and left lung nodule biopsy.   He still is PEG tube dependent with 5-6 cans of Osmolite 1.5/day.  He is not able to eat much by mouth due to severe xerostomia.   There is no pain with the original left neck.  His right neck node does not cause him pain. His strength seemed to have improved compared to prior.  He is independent of activities of daily living.  However, he is not yet to be able to go back to work due to mild generalized fatigue.  He does have some tinnitus from chemo before but denied hearing loss.   Patient denies fever, anorexia, weight loss, fatigue, headache, visual changes, confusion, drenching night sweats, mucositis, odynophagia, dysphagia, nausea vomiting, jaundice, chest pain, palpitation, shortness of breath, dyspnea on exertion, productive cough, gum bleeding, epistaxis, hematemesis, hemoptysis, abdominal pain, abdominal swelling, early satiety, melena, hematochezia, hematuria, skin rash, spontaneous bleeding, joint swelling, joint pain, heat or cold intolerance, bowel bladder incontinence, back pain, focal motor weakness, paresthesia, depression, suicidal or homocidal ideation, feeling hopelessness.   Past Medical History  Diagnosis Date  . Hemorrhoids   . PONV (postoperative nausea and vomiting)   . Hypertension     no medications at present  . GERD (gastroesophageal reflux disease)     no medication  . Depression     treated approx 12 years ago, no treatment at this time  . Hiatal hernia   . Cancer 12/2010    left side of tongue T1 lesion excised 4/12  . Neutropenia 12/23/2011  . Abscess 12/27/2011  . Hx of radiation therapy 11/15/11 -01/05/12    left lateral tongue    Past Surgical History  Procedure Date  . Inguinal herniorrhapy   . Tonsillectomy and adenoidectomy   . Excision of tongue mass  April 2012  . Hernia repair   . Neck mass excision 07/27/2011  . Radical neck dissection 07/27/2011    Procedure: RADICAL NECK DISSECTION;  Surgeon: Drema Halon, MD;  Location: [facility] OR;  Service: ENT;  Laterality: Left;  Marland Kitchen Mandibulectomy 09/2011    and scapular free flap    Current Outpatient Prescriptions    Medication Sig Dispense Refill  . HYDROcodone-acetaminophen (VICODIN) 5-500 MG per tablet TAKE 1 TABLET BY MOUTH EVERY 6 HOURS AS NEEDED FOR PAIN  30 tablet  0  . ibuprofen (ADVIL,MOTRIN) 200 MG tablet Take 400 mg by mouth every 6 (six) hours as needed. Pain       . cevimeline (EVOXAC) 30 MG capsule Take 1 capsule (30 mg total) by mouth 3 (three) times daily.  90 capsule  3    ALLERGIES:  is allergic to codeine phosphate and tape.  REVIEW OF SYSTEMS:  The rest of the 14-point review of system was negative.   Filed Vitals:   03/22/12 1024  BP: 131/82  Pulse: 85  Temp: 96.9 F (36.1 C)   Wt Readings from Last 3 Encounters:  03/22/12 160 lb 4.8 oz (72.712 kg)  03/19/12 162 lb 12.8 oz (73.846 kg)  02/22/12 164 lb (74.39 kg)   ECOG Performance status: 1  PHYSICAL EXAMINATION:  General:  well-nourished man, in no acute distress.  Eyes:  no scleral icterus.  ENT:  There were no oropharyngeal lesions on my unaided exam.  Neck was without thyromegaly.  There was a flap in the left jaw which has healed.  Lymphatics: positive for 3cm right level III/IV node.  Respiratory: lungs were clear bilaterally without wheezing or crackles.  Cardiovascular:  Regular rate and rhythm, S1/S2, without murmur, rub or gallop.  There was no pedal edema.  GI:  abdomen was soft, flat, nontender, nondistended, without organomegaly.  PEG tube was dry, clean, intact.  Muscoloskeletal:  no spinal tenderness of palpation of vertebral spine.  Skin exam was without echymosis, petichae.  Neuro exam was nonfocal.  Patient was able to get on and off exam table without assistance.  Gait was normal.  Patient was alerted and oriented.  Attention was good.   Language was appropriate.  Mood was normal without depression.  Speech was not pressured.  Thought content was not tangential.     LABORATORY/RADIOLOGY DATA:  Lab Results  Component Value Date   WBC 3.3* 02/13/2012   HGB 10.5* 02/13/2012   HCT 31.1* 02/13/2012   PLT 172  02/13/2012   GLUCOSE 113* 02/13/2012   CHOL 160 02/14/2008   TRIG 442* 02/14/2008   HDL 26.5* 02/14/2008   LDLDIRECT 64.8 02/14/2008   ALKPHOS 53 02/13/2012   ALT <8 02/13/2012   AST 12 02/13/2012   NA 139 02/13/2012   K 4.0 02/13/2012   CL 101 02/13/2012   CREATININE 0.70 02/13/2012   BUN 13 02/13/2012   CO2 28 02/13/2012      ASSESSMENT AND PLAN:   1. Smoking: none at this time.   2. Recurrent left lateral tongue squamous cell carcinoma: S/P chemoradiation therapy. He now has right cervical node met and possible lung met vs primary lung cancer (given his history of smoking).    - I encouraged him to proceed with right neck dissection and left lobe biopsy at [facility].  We then discussed the following possible outcomes.  The first one is that he has path evidence of lung met from HNSCC.  In this case, I would restage fully  to ascertain extend of disease.  If he only has one solitary lung met, then options include wedge resection, lobectomy, stereotactic radiation, versus chemotherapy. If he has extensive met, then systemic chemotherapy such as Carboplatin/Taxol/Erbitux with goal of palliative.  On the other hand, if his lung nodule is bronchogenic carcinoma, then lobectomy is the best option.   3. Anemia: Hemoglobin is slowly improving. No active bleeding. No transfusion indicated.   4. Calorie/Protein malnutrition: stable weight.  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  He would like med for xerostomia.  I prescribed cevimeline.  He told me that he had optho exam about 1-2 years ago; and there was no glaucoma.  I advised him to stop cevimeline and contact us if he develops eye pain, diplopia, blurry vision or any concerning symptoms.   5. Mouth pain from treatment:  He is on Vicodin prn.   6. Follow up: in about 2-3 weeks to finalize treatment plan.      The length of time of the face-to-face encounter was 25 minutes. More than 50% of time was spent counseling and coordination of care.

## 2012-03-23 ENCOUNTER — Telehealth: Payer: Self-pay | Admitting: Oncology

## 2012-03-23 NOTE — Telephone Encounter (Signed)
lmonvm for pt confirming appt for 7/26 and mailed schedule.

## 2012-03-27 HISTORY — PX: MODIFIED RADICAL NECK DISSECTION: SHX2045

## 2012-04-03 ENCOUNTER — Telehealth: Payer: Self-pay | Admitting: Oncology

## 2012-04-03 NOTE — Telephone Encounter (Signed)
Pt called and cancelled appt for 04/06/12, notified RN

## 2012-04-06 ENCOUNTER — Ambulatory Visit: Payer: Self-pay | Admitting: Oncology

## 2012-04-16 ENCOUNTER — Encounter: Payer: Self-pay | Admitting: Radiation Oncology

## 2012-04-16 DIAGNOSIS — Z923 Personal history of irradiation: Secondary | ICD-10-CM | POA: Insufficient documentation

## 2012-04-17 ENCOUNTER — Ambulatory Visit
Admission: RE | Admit: 2012-04-17 | Discharge: 2012-04-17 | Disposition: A | Discharge status: Home / Self Care | Payer: Medicaid Other | Source: Ambulatory Visit | Attending: Radiation Oncology | Admitting: Radiation Oncology

## 2012-04-17 VITALS — BP 152/85 | HR 94 | Temp 98.6°F | Resp 20 | Wt 149.8 lb

## 2012-04-17 DIAGNOSIS — C801 Malignant (primary) neoplasm, unspecified: Secondary | ICD-10-CM | POA: Insufficient documentation

## 2012-04-17 DIAGNOSIS — C029 Malignant neoplasm of tongue, unspecified: Secondary | ICD-10-CM

## 2012-04-17 DIAGNOSIS — C770 Secondary and unspecified malignant neoplasm of lymph nodes of head, face and neck: Secondary | ICD-10-CM | POA: Insufficient documentation

## 2012-04-17 DIAGNOSIS — R911 Solitary pulmonary nodule: Secondary | ICD-10-CM

## 2012-04-17 DIAGNOSIS — J984 Other disorders of lung: Secondary | ICD-10-CM | POA: Insufficient documentation

## 2012-04-17 NOTE — Progress Notes (Signed)
Please see the Nurse Progress Note in the MD Initial Consult Encounter for this patient. 

## 2012-04-17 NOTE — Progress Notes (Signed)
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 Grays returns today for review with consideration of further postoperative ration therapy to his right neck and possible lung SB RT in the management of his recurrent squamous cell carcinoma of the left lung. I last saw the patient on 02/01/2012 at which time his 1 month following completion of postoperative chemoradiation for his T4 N0 squamous cell carcinoma involving left lateral tongue. He was making satisfactory progress. By mid June he noted a mass "just one finger width" about his right clavicle. This was was brought to the attention of Dr. Erroll Luna who apparently performed a needle aspiration biopsy which was diagnostic for recurrent squamous cell carcinoma. His staging workup included a CT scan of the neck on 03/14/2012 showing interval development of a 2.5 cm necrotic right level 3/4 lymph node along with previous postoperative changes. A CT the chest showed a 1.8 x 1.8 cm cavitary lesion within the left upper lobe concerning for metastatic deposit although a primary tumor could not be entirely excluded. Biopsy of his left lung mass on 04/10/2012 was diagnostic for squamous cell carcinoma but it could not be determined whether not this represented a solitary metastasis or a separate lung primary by immunohistochemical staining. On 03/27/2012 the patient underwent a right modified radical neck dissection with sacrifice of the sternocleidomastoid muscle and internal jugular vein, levels I-V. On review of his pathology he was found to have tumor involving skeletal muscle and soft tissue with extensive perineural invasion but no lymphovascular space invasion with 18 lymph nodes free of metastatic disease. Clinically, there appear to be extension of tumor into the wall of the carotid but pathologically this showed fibrosis  and inflammation. The right lower neck mass was felt to represent a replaced lymph node with metastatic moderate to poorly differentiated squamous cell carcinoma. The patient has been doing relatively well postoperatively. He was seen in consultation by Dr. Marcelline Mates and offered lung SB RT and perhaps postoperative radiation therapy to his right neck. The patient has not yet seen Dr. Gaylyn Rong of medical oncology here in [place] since his surgery. He has not had a PET scan for restaging. He does have vague right scalp sensitivity which was present prior to his surgery. Prior to his surgery he did have ear pain which is improved.  Physical examination: Alert and oriented. Wt Readings from Last 3 Encounters:  04/17/12 149 lb 12.8 oz (67.949 kg)  03/22/12 160 lb 4.8 oz (72.712 kg)  03/19/12 162 lb 12.8 oz (73.846 kg)   Temp Readings from Last 3 Encounters:  04/17/12 98.6 F (37 C) Oral  03/22/12 96.9 F (36.1 C) Oral  02/13/12 99.5 F (37.5 C) Oral   BP Readings from Last 3 Encounters:  04/17/12 152/85  03/22/12 131/82  02/13/12 128/83   Pulse Readings from Last 3 Encounters:  04/17/12 94  03/22/12 85  02/13/12 70   Head and neck examination: His right neck wound is healing well. There is no palpable evidence for recurrent disease along the left or right neck. On inspection oral cavity there is slight irregularity and induration along his left floor of mouth but no obvious tumor. His left oral commissure wound is well-healed. Chest: Lungs clear. Abdomen: Without hepatomegaly. Extremities: Without edema.  Laboratory data: White blood count 7.0 K, Hemoglobin 10.0, platelet count 203K from 03/29/2012.   Outside CT scans from [facility]  have not yet been reviewed and are being forwarded at the time this dictation.  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 I reviewed his IMRT doses and his contralateral (right) neck  received between 4500-5600 cGy. I spoke with Dr. Manson Passey earlier today and he feels that the patient is at significant risk for local regional recurrence within his right neck. He'll be re planned for further radiation therapy/IMRT to his right neck with consideration of sensitizing chemotherapy through Dr. Gaylyn Rong. I told the patient that I would feel comfortable with him being treated either at [facility], or here [facility], he prefers to be treated closer to home. He'll cancel his CT simulation appointment for his lung SB RT. We'll also spent some time discussing the likelihood that he has a solitary metastasis rather than a new lung primary, however he does have a smoking history and one can certainly justify an aggressive approach provided that a PET scan is without evidence for other sites of metastatic disease. We'll get him set up for a PET scan here in [place] along consultation with Dr. Gaylyn Rong. I will first get him set up for stereotactic radiosurgery to his left lung lesion and then have him undergo IMRT treatment planning. I explained to Mr. Winker that there is significant risk with reirradiation of the right neck including both vascular, nerve, and soft tissue damage. Complications can be fatal. His understands and wishes to proceed as outlined. Consent is signed today.  Plan: As discussed above. I'll have him see Dr. Gaylyn Rong, undergo a PET scan, and get him scheduled for SB RT to the left lung and then IMRT to the right neck. We may consider twice a day radiation therapy in conjunction with chemotherapy.  1 hour spent face-to-face with the patient, primarily counseling the patient and coordinating his care.

## 2012-04-17 NOTE — Progress Notes (Signed)
Pt c/o soreness, tenderness in his right neck, post-op. Taking Oxy-IR bid, Hydrocodone prn. He states he alternates these meds; gets fair pain relief. Pt also c/o "scalp pain, right-sided headaches since his neck dissection". Takes Advil for this pain w/good relief. Pt continues to use peg tube for nutrition, states he's had difficulty swallowing since recent neck surgery, has thick saliva.  Pt denies cough, sob s/p left lung bx. He states he "had pneumothorax after the biopsy".

## 2012-04-18 ENCOUNTER — Other Ambulatory Visit: Payer: Self-pay | Admitting: Oncology

## 2012-04-18 ENCOUNTER — Telehealth: Payer: Self-pay | Admitting: Oncology

## 2012-04-18 DIAGNOSIS — C029 Malignant neoplasm of tongue, unspecified: Secondary | ICD-10-CM

## 2012-04-18 NOTE — Telephone Encounter (Signed)
S/w the pt and he isaware of his appts on 05/03/2012

## 2012-04-18 NOTE — Addendum Note (Signed)
Encounter addended by: Glennie Hawk, RN on: 04/18/2012 12:47 PM<BR>     Documentation filed: Charges VN

## 2012-04-20 ENCOUNTER — Telehealth: Payer: Self-pay | Admitting: *Deleted

## 2012-04-20 NOTE — Telephone Encounter (Signed)
Per Dr Dayton Scrape wants pt to have PET scan next week if possible. Called nuclear med to reschedule pt's PET scan. Currently scheduled for 06/05/12. Per Almira Coaster, nuclear med dept, pt can be rescheduled for PET on 04/25/12, arrive 6:45 am. Called pt to confirm appt. Pt states he can come for PET scan on 04/25/12. Pt understands to arrive at 6:45 am, NPO after midnight on 04/24/12. Pt is not diabetic.

## 2012-04-23 ENCOUNTER — Ambulatory Visit
Admission: RE | Admit: 2012-04-23 | Discharge: 2012-04-23 | Disposition: A | Discharge status: Home / Self Care | Payer: Medicaid Other | Source: Ambulatory Visit | Attending: Radiation Oncology | Admitting: Radiation Oncology

## 2012-04-23 ENCOUNTER — Telehealth: Payer: Self-pay | Admitting: Radiation Oncology

## 2012-04-23 ENCOUNTER — Ambulatory Visit: Payer: Medicaid Other | Attending: Oncology

## 2012-04-23 ENCOUNTER — Ambulatory Visit: Payer: Medicaid Other

## 2012-04-23 DIAGNOSIS — IMO0001 Reserved for inherently not codable concepts without codable children: Secondary | ICD-10-CM | POA: Insufficient documentation

## 2012-04-23 DIAGNOSIS — R1311 Dysphagia, oral phase: Secondary | ICD-10-CM | POA: Insufficient documentation

## 2012-04-23 NOTE — Telephone Encounter (Signed)
Met with patient to discuss RO billing and EPP has been updated.   Indigent Approved 50-50%  Family Size: 1  HH INC:18708.00 MOD POV: 18,671.25-19,102.13 Valid Dates:04/23/2012-10/24/2012  [facility] $400  Dx: 162.5 Lower lobe, lung  Attending Rad: Dr. Dayton Scrape

## 2012-04-24 ENCOUNTER — Telehealth: Payer: Self-pay | Admitting: Radiation Oncology

## 2012-04-25 ENCOUNTER — Encounter (HOSPITAL_COMMUNITY): Payer: Self-pay

## 2012-04-25 ENCOUNTER — Encounter (HOSPITAL_COMMUNITY)
Admission: RE | Admit: 2012-04-25 | Discharge: 2012-04-25 | Disposition: A | Discharge status: Home / Self Care | Payer: Medicaid Other | Source: Ambulatory Visit | Attending: Radiation Oncology | Admitting: Radiation Oncology

## 2012-04-25 DIAGNOSIS — C760 Malignant neoplasm of head, face and neck: Secondary | ICD-10-CM | POA: Insufficient documentation

## 2012-04-25 DIAGNOSIS — C7951 Secondary malignant neoplasm of bone: Secondary | ICD-10-CM | POA: Insufficient documentation

## 2012-04-25 DIAGNOSIS — C770 Secondary and unspecified malignant neoplasm of lymph nodes of head, face and neck: Secondary | ICD-10-CM

## 2012-04-25 DIAGNOSIS — K8020 Calculus of gallbladder without cholecystitis without obstruction: Secondary | ICD-10-CM | POA: Insufficient documentation

## 2012-04-25 DIAGNOSIS — J438 Other emphysema: Secondary | ICD-10-CM | POA: Insufficient documentation

## 2012-04-25 DIAGNOSIS — R911 Solitary pulmonary nodule: Secondary | ICD-10-CM

## 2012-04-25 DIAGNOSIS — J984 Other disorders of lung: Secondary | ICD-10-CM | POA: Insufficient documentation

## 2012-04-25 HISTORY — DX: Secondary malignant neoplasm of bone: C79.51

## 2012-04-25 MED ORDER — FLUDEOXYGLUCOSE F - 18 (FDG) INJECTION
16.5000 | Freq: Once | INTRAVENOUS | Status: AC | PRN
  Administered 2012-04-25: 16.5 via INTRAVENOUS

## 2012-04-25 NOTE — Telephone Encounter (Signed)
VOID

## 2012-05-01 ENCOUNTER — Encounter: Payer: Self-pay | Admitting: Radiation Oncology

## 2012-05-01 ENCOUNTER — Encounter: Payer: Self-pay | Admitting: *Deleted

## 2012-05-01 NOTE — Progress Notes (Signed)
[facility] Psychosocial Distress Screening Clinical Social Work  Clinical Social Work was referred by distress screening protocol.  The patient scored a 8 on the Psychosocial Distress Thermometer which indicates severe distress. Clinical Social Worker contacted the patient to assess for distress and other psychosocial needs. Mr. Lemelin stated that he feels he is coping adequately, although he feels that "there is always something new".  The patient was referring to his cancer metastasizing to lung and possibly bone?  He states he depends on his strong faith and humor when he begins to feel anxious or sad.  Mr. Schwager feels relieved to continue to receive treatment at [facility] and not [facility], mainly due to transportation and he feels that he has developed strong relationships with healthcare providers at [facility].  At this time, his biggest cause of distress is fear of the unknown and feels this fear has grown based on the most recent findings.  He plans to discuss his concerns and has questions for his upcoming appointment with Dr. Dayton Scrape.  CSW and patient discussed possible coping skills and support programs available at [facility].  CSW also explained role of support team.  Mr. Paget was interested in completing advance directives and plans to follow up with CSW after radiation oncology appointment.    Clinical Social Worker follow up needed: yes  If yes, follow up plan: Patient plans to contact CSW when ready to schedule appointment.  Kathrin Penner, MSW, [facility] Clinical Social Worker [facility] [phone]

## 2012-05-02 ENCOUNTER — Ambulatory Visit: Admission: RE | Admit: 2012-05-02 | Payer: Medicaid Other | Source: Ambulatory Visit | Admitting: Radiation Oncology

## 2012-05-02 ENCOUNTER — Encounter: Payer: Self-pay | Admitting: Radiation Oncology

## 2012-05-02 ENCOUNTER — Ambulatory Visit
Admission: RE | Admit: 2012-05-02 | Discharge: 2012-05-02 | Disposition: A | Discharge status: Home / Self Care | Payer: Medicaid Other | Source: Ambulatory Visit | Attending: Radiation Oncology | Admitting: Radiation Oncology

## 2012-05-02 ENCOUNTER — Other Ambulatory Visit (HOSPITAL_COMMUNITY): Payer: Self-pay

## 2012-05-02 VITALS — BP 126/86 | HR 87 | Temp 98.2°F | Resp 18 | Ht 72.0 in | Wt 149.0 lb

## 2012-05-02 DIAGNOSIS — C7951 Secondary malignant neoplasm of bone: Secondary | ICD-10-CM | POA: Insufficient documentation

## 2012-05-02 DIAGNOSIS — C029 Malignant neoplasm of tongue, unspecified: Secondary | ICD-10-CM | POA: Insufficient documentation

## 2012-05-02 DIAGNOSIS — C50919 Malignant neoplasm of unspecified site of unspecified female breast: Secondary | ICD-10-CM | POA: Insufficient documentation

## 2012-05-02 DIAGNOSIS — C7952 Secondary malignant neoplasm of bone marrow: Secondary | ICD-10-CM | POA: Insufficient documentation

## 2012-05-02 NOTE — Progress Notes (Signed)
See progress note under physician encounter. 

## 2012-05-02 NOTE — Progress Notes (Signed)
Patient presents to the clinic today accompanied by his dad for a reconsult with Dr. Dayton Scrape. Patient is alert and oriented to person, place, and time. No distress noted. Steady gait noted. Pleasant affect noted. Patient reports constant throbbing pain in his right shoulder all the way up to his right jaw bone 5 on a scale of 0-10 follow vicodin at 0630. Patient reports that 3-4 hours after taking pain medication is pain is as high as 7 on a scale of 0-10. Patient denies cough. Patient denies shortness of breath. Patient reports that he is having difficulty swallowing. Patient reports he has to swallow approximately 4 times to get one sip of water down. Patient reports working with Verdie Mosher once a month on swallowing exercises. Dry mouth continues but, reports it has improved. However, patient reports foam has returned. Patient denies pain in the hip. Patient recalls a times over a year ago when his "back went out" in the affect area displayed on the pet. Weight stable compared to 04/17/2012. Patient reports a good appetite. Patient reports that he takes nothing by mouth. Patient uses PEG tube 100% of time. Patient reports sleeping without difficulty with ativan. Patient reports that if he didn't take his ativan and his pain medication he would be awake with pain. Reported all findings to Dr. Dayton Scrape.

## 2012-05-02 NOTE — Progress Notes (Signed)
Followup note: [name] returns today for review after completing his staging workup with a PET scan. Please refer to my dictated followup note from 04/17/2012. His PET scan from 04/25/2012 shows the known left lower lobe cavitary nodule now measuring 2.9 x 2.4 centimeter with a SUV of 19.2. Unfortunately, he appears to have a left posterior iliac bone metastasis with a SUV Max of 7.8 responding to a subtle lucency seen on his CT scan. Both the lung and iliac activity is new compared to his treatment planning PET scan back in 07/25/2011. There is no activity seen along the left neck but there is ill-defined right neck hypermetabolism felt to be related to recent surgery. The patient is to see Dr. Gaylyn Rong of medical oncology tomorrow.  Physical examination: Alert and oriented. Wt Readings from Last 3 Encounters:  05/02/12 149 lb (67.586 kg)  04/17/12 149 lb 12.8 oz (67.949 kg)  03/22/12 160 lb 4.8 oz (72.712 kg)   Temp Readings from Last 3 Encounters:  05/02/12 98.2 F (36.8 C) Oral  04/17/12 98.6 F (37 C) Oral  03/22/12 96.9 F (36.1 C) Oral   BP Readings from Last 3 Encounters:  05/02/12 126/86  04/17/12 152/85  03/22/12 131/82   Pulse Readings from Last 3 Encounters:  05/02/12 87  04/17/12 94  03/22/12 85   There is a fair amount of induration along the right neck but no palpable evidence for persistent/recurrent disease. Surgical changes are noted along the left neck with no palpable evidence for recurrent disease.  Impression: The patient appears to have metastatic disease to bone based on his recent PET scan. I did speak with radiology who feels that they may be able to biopsy the left iliac lesion even though it is small. I reviewed the PET scan with both the patient and his father. I presented his case to my associates, the general consensus is that we should proceed with systemic therapy and hold radiation therapy in reserve for development of symptomatic disease, progression of  his lung disease, bone disease, or recurrent disease along his right neck. Even though he is at high-risk for recurrent disease along his right neck, I would prefer that he had systemic therapy which may prolong his survival. Retreatment of his right neck would certainly be at an increased risk because of his previous radiation therapy. Again, he'll see Dr. Gaylyn Rong of medical oncology tomorrow. Dr. Gaylyn Rong and the patient can decide on whether or not they want to confirm the presence of metastatic disease with biopsy of his left iliac bone lesion.   30 minutes was spent face-to-face with the patient and his father, primarily counseling the patient.  CC: Dr. Georgie Chard, Dr. Narda Bonds, Dr. Birdie Sons, Dr. Dian Queen and Dr. Jethro Bolus

## 2012-05-02 NOTE — Progress Notes (Signed)
Submitted PATIENT MEASURE OF DISTRESS worksheet with a score of 8 to social work. Patient denies need to see social worker today but, left contact number. Patient seen by Roselee Nova, LCSW on 05/01/2012. See note.

## 2012-05-03 ENCOUNTER — Ambulatory Visit (HOSPITAL_BASED_OUTPATIENT_CLINIC_OR_DEPARTMENT_OTHER): Payer: Medicaid Other | Admitting: Oncology

## 2012-05-03 ENCOUNTER — Other Ambulatory Visit (HOSPITAL_BASED_OUTPATIENT_CLINIC_OR_DEPARTMENT_OTHER): Payer: Medicaid Other | Admitting: Lab

## 2012-05-03 ENCOUNTER — Telehealth: Payer: Self-pay | Admitting: Oncology

## 2012-05-03 VITALS — BP 118/79 | HR 91 | Temp 98.2°F | Resp 20 | Ht 72.0 in | Wt 148.4 lb

## 2012-05-03 DIAGNOSIS — C01 Malignant neoplasm of base of tongue: Secondary | ICD-10-CM

## 2012-05-03 DIAGNOSIS — C7800 Secondary malignant neoplasm of unspecified lung: Secondary | ICD-10-CM

## 2012-05-03 DIAGNOSIS — D638 Anemia in other chronic diseases classified elsewhere: Secondary | ICD-10-CM

## 2012-05-03 DIAGNOSIS — C7951 Secondary malignant neoplasm of bone: Secondary | ICD-10-CM

## 2012-05-03 DIAGNOSIS — C029 Malignant neoplasm of tongue, unspecified: Secondary | ICD-10-CM

## 2012-05-03 LAB — CBC WITH DIFFERENTIAL/PLATELET
Basophils Absolute: 0 10*3/uL (ref 0.0–0.1)
Eosinophils Absolute: 0.1 10*3/uL (ref 0.0–0.5)
HCT: 28.8 % — ABNORMAL LOW (ref 38.4–49.9)
HGB: 9.7 g/dL — ABNORMAL LOW (ref 13.0–17.1)
LYMPH%: 14 % (ref 14.0–49.0)
MONO#: 0.4 10*3/uL (ref 0.1–0.9)
NEUT#: 2.6 10*3/uL (ref 1.5–6.5)
Platelets: 204 10*3/uL (ref 140–400)
RBC: 3.13 10*6/uL — ABNORMAL LOW (ref 4.20–5.82)
WBC: 3.6 10*3/uL — ABNORMAL LOW (ref 4.0–10.3)

## 2012-05-03 LAB — COMPREHENSIVE METABOLIC PANEL
Albumin: 3.8 g/dL (ref 3.5–5.2)
BUN: 13 mg/dL (ref 6–23)
CO2: 32 mEq/L (ref 19–32)
Glucose, Bld: 80 mg/dL (ref 70–99)
Sodium: 140 mEq/L (ref 135–145)
Total Bilirubin: 0.3 mg/dL (ref 0.3–1.2)
Total Protein: 6.8 g/dL (ref 6.0–8.3)

## 2012-05-03 LAB — TSH: TSH: 1.064 u[IU]/mL (ref 0.350–4.500)

## 2012-05-03 NOTE — Progress Notes (Signed)
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  He now developed biopsy proven metastatic disease in 03/2012 with met in right neck, left lung mass and left illac.   PAST THERAPY:  1. 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 He developed recurrence in the left submandibular gland in August 2012. He underwent on 07/27/2011 radical left neck dissection by Dr. Ezzard Standing and Dr. Geryl Rankins. Pathology case number [id number] showed total 15 lymph nodes from left radical neck dissection that was all negative. However the left submandibular gland mass showed a 3.5 cm invasive SCC, moderately differentiates involving the adjacent skeletal muscle tissue and bone a tissue and extending into the inked margin. There was no evidence of any lymphatic invasion or perineural invasion. He underwent further resection by Dr. Mary Sella at [facility] on 09/29/2011.  2. He received adjuvant chemoradiation therapy with q 3 week Cisplatin given 11/15/11-12/05/11 (received 2 of 3 doses; 3rd dose held due to febrile neutropenia and development of flap abscess). He received concurrent XRT 11/15/11 through 01/05/12.  3.  For right neck recurrence in 03/2012; he underwent palliative right neck dissection by Dr. Manson Passey at [facility].   CURRENT THERAPY: due to start palliative chemotherapy soon.   INTERVAL HISTORY: Thomas Bean 51 y.o. male returns for regular follow up with his father.  He reports mild fatigue after the most recent right neck dissection.  He is still able to live by himself and is independent of all activities of daily living.  He still is not able to eat regular foods by mouth due to dysphagia.  He uses PEG tube for about 6 cans of Osmolyte a day.   He is able to drink by mouth and eat puree foods.  He denied SOB, hemoptysis, left iliac bone pain, lower extremity weakness, lower extremity paraesthesia, bowel/bladder incontinence.   Patient denies fever, anorexia, headache, visual changes, confusion, drenching night sweats, palpable lymph node swelling, mucositis, odynophagia, dysphagia, nausea vomiting, jaundice, chest pain, palpitation, shortness of breath, dyspnea on exertion, productive cough, gum bleeding, epistaxis, hematemesis, hemoptysis, abdominal pain, abdominal swelling, early satiety, melena, hematochezia, hematuria, skin rash, spontaneous bleeding, joint swelling, joint pain, heat or cold intolerance, bowel bladder incontinence, back pain, focal motor weakness, paresthesia, depression, suicidal or homicidal ideation, feeling hopelessness.   Past Medical History  Diagnosis Date  . Hemorrhoids   . PONV (postoperative nausea and vomiting)   . Hypertension     no medications at present  . GERD (gastroesophageal reflux disease)     no medication  . Depression     treated approx 12 years ago, no treatment at this time  . Hiatal hernia   . Cancer 12/2010    left side of tongue T1 lesion excised 4/12  . Neutropenia 12/23/2011  . Abscess 12/27/2011  . Hx of radiation therapy 11/15/11 -01/05/12    left lateral tongue  . Lesion of left lung 03/14/12    CT chest, [facility], squamous cell carcinoma  . Bone metastasis 04/25/12    PET scan-left iliac bone    Past Surgical History  Procedure Date  . Inguinal herniorrhapy   . Tonsillectomy and adenoidectomy   . Excision of tongue mass     April 2012  . Hernia repair   .  Neck mass excision 07/27/2011  . Radical neck dissection 07/27/2011    Procedure: RADICAL NECK DISSECTION;  Surgeon: Drema Halon, MD;  Location: [facility] OR;  Service: ENT;  Laterality: Left;  Marland Kitchen Mandibulectomy 09/2011    and scapular free flap  . Modified radical neck dissection 03/27/12    excision of lip lesion, lip  benign, right neck squamous cell ca    Current Outpatient Prescriptions  Medication Sig Dispense Refill  . HYDROcodone-acetaminophen (NORCO/VICODIN) 5-325 MG per tablet Take 1 tablet by mouth every 4 (four) hours as needed.      Marland Kitchen ibuprofen (ADVIL,MOTRIN) 200 MG tablet Take 400 mg by mouth every 6 (six) hours as needed. Pain       . LORazepam (ATIVAN) 0.5 MG tablet Take 0.5 mg by mouth every 8 (eight) hours as needed.      Marland Kitchen omeprazole (PRILOSEC) 10 MG capsule Take 10 mg by mouth daily.        ALLERGIES:  is allergic to codeine phosphate and tape.  REVIEW OF SYSTEMS:  The rest of the 14-point review of system was negative.   Filed Vitals:   05/03/12 0922  BP: 118/79  Pulse: 91  Temp: 98.2 F (36.8 C)  Resp: 20   Wt Readings from Last 3 Encounters:  05/03/12 148 lb 6.4 oz (67.314 kg)  05/02/12 149 lb (67.586 kg)  04/17/12 149 lb 12.8 oz (67.949 kg)   ECOG Performance status: 1  PHYSICAL EXAMINATION:   General: thin-appearing man, in no acute distress. Eyes: no scleral icterus. ENT: There were no oropharyngeal lesions on my unaided exam. Neck was without thyromegaly. There was a flap in the left jaw which has healed.  Right neck was fibrotic from recent resection.  There was no erythema, purulent discharge, pain on palpation. Lymphatics: positive for 3cm right level III/IV node. Respiratory: lungs were clear bilaterally without wheezing or crackles. Cardiovascular: Regular rate and rhythm, S1/S2, without murmur, rub or gallop. There was no pedal edema. GI: abdomen was soft, flat, nontender, nondistended, without organomegaly. PEG tube was dry, clean, intact. Muscoloskeletal: no spinal tenderness of palpation of vertebral spine. Skin exam was without echymosis, petichae. Neuro exam was nonfocal. Patient was able to get on and off exam table without assistance. Gait was normal. Patient was alerted and oriented. Attention was good. Language was appropriate. Mood was normal without  depression. Speech was not pressured. Thought content was not tangential.    LABORATORY/RADIOLOGY DATA:  Lab Results  Component Value Date   WBC 3.6* 05/03/2012   HGB 9.7* 05/03/2012   HCT 28.8* 05/03/2012   PLT 204 05/03/2012   GLUCOSE 113* 02/13/2012   CHOL 160 02/14/2008   TRIG 442* 02/14/2008   HDL 26.5* 02/14/2008   LDLDIRECT 64.8 02/14/2008   ALKPHOS 53 02/13/2012   ALT <8 02/13/2012   AST 12 02/13/2012   NA 139 02/13/2012   K 4.0 02/13/2012   CL 101 02/13/2012   CREATININE 0.70 02/13/2012   BUN 13 02/13/2012   CO2 28 02/13/2012   IMAGING:  I personally reviewed the following PET scan and discussed the result of patient and his father.   Nm Pet Image Restag (ps) Skull Base To Thigh  04/25/2012  *RADIOLOGY REPORT*  Clinical Data: Subsequent treatment strategy for history head neck cancer with new lung nodule.  Lesion of left lung.  NUCLEAR MEDICINE PET CT SKULL BASE TO THIGH  Technique:  Technique:  16.5 mCi F-18 FDG was injected intravenously. CT data was obtained and  used for attenuation correction and anatomic localization only.  (This was not acquired as a diagnostic CT examination.) Additional exam technical data entered on technologist worksheet.  Comparison: PET of 07/25/2011.  Chest film of 12/26/2011. Clinic note of 04/17/2012  Findings: Neck: Resolution of previously described hypermetabolic left submandibular adenopathy.  Right greater than left but relatively diffuse hypermetabolism involves the mucosal surfaces and soft tissues of the neck.  Somewhat more focal hypermetabolism is identified in the region of the postoperative changes within the lower right neck and supraclavicular regions.  No convincing evidence of concurrent adenopathy.  Chest:  A high right mediastinal node measures 5 mm and a S.U.V. max of 2.7 on image 72 of series 2.  Favored to be reactive.  The superior segment left lower lobe partially cavitary pleural- based nodule measures 2.9 x 2.4 cm and a S.U.V. max of 19.2 on image 88 of  series 2.  This is new since the prior PET of 07/25/2011.  Abdomen/Pelvis:  No extra osseous hypermetabolism within the abdomen or pelvis.  Skelton:  A focus of hypermetabolism within the left posterior iliac bone measures a S.U.V. max of 7.8 on image 210 and likely corresponds to subtle lucency.  CT images performed for attenuation correction demonstrate surgical change about the left mandible.  Bilateral neck dissection.  Ill definition of fat planes secondary to prior radiation therapy.  No convincing evidence of well-defined adenopathy.  Equivocal small node in the right level II station on image 44.  Moderate centrilobular emphysema.  Suspect reticular nodular opacity the right lung base, including on image 120 of series 2. Relatively mild.  Low density right renal lesions which are likely cysts.  Age advanced aortic atherosclerosis.  Gallstones.  Chronic calcified process about the posterior spleen is not hypermetabolic and not significantly changed.  The gastrostomy tube is appropriately positioned.  Apparent bladder wall thickening is favored to be due to underdistension.  IMPRESSION:  1.  Response to therapy within the head neck, without convincing evidence of residual hypermetabolic disease.  Relatively ill- defined right greater than left hypermetabolism is felt to be treatment related, without well-defined adenopathy. 2.  Superior segment left lower lobe cavitary hypermetabolic lung nodule.  This could represent an isolated metastasis or metachronous primary.  Given interval development in 8 months, isolated squamous cell metastasis is slightly favored. 3.  Left iliac bone metastasis. 4.  Right base mild airspace disease.  Question infection or aspiration. 5.  Cholelithiasis.  Original Report Authenticated By: Consuello Bossier, M.D.    ASSESSMENT AND PLAN:   1. Smoking: none at this time.   2. Recurrent left lateral tongue squamous cell carcinoma: He now has metastatic disease.    Left lung mass  and left iliac bone lesion:  Both active on PET scan.  I have high clinical suspicion for metastatic disease from head/neck cancer.   His left lung mass was biopsied in 03/2012 with squamous cell.  I have high pretest probability that he has metastatic disease.  He does not want left iliac biopsy either.    I recommended palliative chemotherapy.  Unfortunately, there is no curative measure for metastatic head/neck cancer.   Without chemotherapy, life expectancy is limited about 6 months.  With chemotherapy, life expectancy can potentially increase by 4-6 months (on average).   Chemo:  Carboplatin/Taxol/Erbitux:  Once a week; 3 weeks on; 1 week of.  Will repeat scan after 3 months to assess response.   This combination chemo has potential side effects which  include but not limited to mucositis, cytopenia, infection, infusion reaction, neuropathy, myalgia, arthralgia, pneumonitis, skin rash, electrolytes abnormality, infusion reaction, diarrhea.  Mr. Furuya and his father expressed informed understanding and wished to proceed.   3. Anemia:  This is from anemia of chronic disease.  Hemoglobin is slowly improving. No active bleeding. No transfusion indicated.   4. Calorie/Protein malnutrition: stable weight. 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  5. Mouth pain from treatment: He is on Vicodin prn.  6. Follow up: on 05/14/2012 for start of chemo.  He would like one week to recover before starting chemo.   We will see him once a week during the first week to ensure toleration before spacing visit out to once monthly.      The length of time of the face-to-face encounter was 25 minutes. More than 50% of time was spent counseling and coordination of care.

## 2012-05-03 NOTE — Patient Instructions (Addendum)
A.  Result of PET scan  Findings:   Neck: Resolution of previously described hypermetabolic  left submandibular adenopathy. Right greater than left but  relatively diffuse hypermetabolism involves the mucosal surfaces  and soft tissues of the neck. Somewhat more focal hypermetabolism  is identified in the region of the postoperative changes within the  lower right neck and supraclavicular regions. No convincing  evidence of concurrent adenopathy.   Chest: A high right mediastinal node measures 5 mm and a S.U.V.  max of 2.7 on image 72 of series 2. Favored to be reactive.  The superior segment left lower lobe partially cavitary pleural-  based nodule measures 2.9 x 2.4 cm and a S.U.V. max of 19.2 on  image 88 of series 2. This is new since the prior PET of  07/25/2011.   Abdomen/Pelvis: No extra osseous hypermetabolism within the  abdomen or pelvis.   Skel[place]: A focus of hypermetabolism within the left posterior  iliac bone measures a S.U.V. max of 7.8 on image 210 and likely  corresponds to subtle lucency.   CT images performed for attenuation correction demonstrate surgical  change about the left mandible. Bilateral neck dissection. Ill  definition of fat planes secondary to prior radiation therapy. No  convincing evidence of well-defined adenopathy. Equivocal small  node in the right level II station on image 44.   Moderate centrilobular emphysema. Suspect reticular nodular  opacity the right lung base, including on image 120 of series 2.  Relatively mild. Low density right renal lesions which are likely  cysts. Age advanced aortic atherosclerosis. Gallstones. Chronic  calcified process about the posterior spleen is not hypermetabolic  and not significantly changed. The gastrostomy tube is  appropriately positioned. Apparent bladder wall thickening is  favored to be due to underdistension.   IMPRESSION:   1. Response to therapy within the head neck, without convincing    evidence of residual hypermetabolic disease. Relatively ill-  defined right greater than left hypermetabolism is felt to be  treatment related, without well-defined adenopathy.  2. Superior segment left lower lobe cavitary hypermetabolic lung  nodule. This could represent an isolated metastasis or  metachronous primary. Given interval development in 8 months,  isolated squamous cell metastasis is slightly favored.  3. Left iliac bone metastasis.  4. Right base mild airspace disease. Question infection or  aspiration.  5. Cholelithiasis.  B.  WORK UP:  Left lung mass and left iliac bone lesion:  Both active on PET scan.  I have high clinical suspicion for metastatic disease from head/neck cancer.   I still endorse biopsy of either the lung or bone lesion to prove metastatic disease (and also rule out lung cancer with met to the bone which is treated differently than metastatic head/neck cancer).  C.  Treatment:  Assume metastatic head/neck cancer, I recommended palliative chemotherapy.  Unfortunately, there is no curative measure for metastatic head/neck cancer.   Without chemotherapy, life expectancy is limited.  With chemotherapy, life expectancy can potentially increase by 4-6 months (on average).   Chemo:  Carboplatin/Taxol/Erbitux:  Once a week; 3 weeks on; 1 week of.  Will repeat scan after 3 months to assess response.   This combination chemo has potential side effects which include but not limited to mucositis, cytopenia, infection, infusion reaction, neuropathy, myalgia, arthralgia, pneumonitis, skin rash, electrolytes abnormality, infusion reaction, diarrhea.

## 2012-05-03 NOTE — Telephone Encounter (Signed)
Talked to pt , he is aware of appt on 05/15/12, advised him to get a new appt calendar for month of September 2013

## 2012-05-09 ENCOUNTER — Telehealth: Payer: Self-pay | Admitting: *Deleted

## 2012-05-09 ENCOUNTER — Other Ambulatory Visit: Payer: Self-pay | Admitting: Oncology

## 2012-05-09 DIAGNOSIS — C029 Malignant neoplasm of tongue, unspecified: Secondary | ICD-10-CM

## 2012-05-09 MED ORDER — HYDROCODONE-ACETAMINOPHEN 7.5-500 MG/15ML PO SOLN: 15.0000 mL | Freq: Four times a day (QID) | ORAL | Status: DC | PRN

## 2012-05-09 NOTE — Telephone Encounter (Signed)
Called pt informed him of new Rx for Hydrocodone elixir 7.5mg  per dose,  rx available to pick up .Thomas Bean  Pt verbalized understanding.  Rx placed in book in injection room.

## 2012-05-09 NOTE — Telephone Encounter (Signed)
Pt left VM states needs refill on Hydrocodone.  Currently taking 1 1/2 tabs of Hydrocodone 5/325 mg for pain.  States one pill does not help.  Asking if can get refill for hydrocodone 7.5mg ?

## 2012-05-11 ENCOUNTER — Telehealth: Payer: Self-pay | Admitting: *Deleted

## 2012-05-14 ENCOUNTER — Other Ambulatory Visit: Payer: Self-pay | Admitting: Oncology

## 2012-05-15 ENCOUNTER — Other Ambulatory Visit (HOSPITAL_BASED_OUTPATIENT_CLINIC_OR_DEPARTMENT_OTHER): Payer: Medicaid Other | Admitting: Lab

## 2012-05-15 ENCOUNTER — Encounter: Payer: Self-pay | Admitting: *Deleted

## 2012-05-15 ENCOUNTER — Ambulatory Visit (HOSPITAL_BASED_OUTPATIENT_CLINIC_OR_DEPARTMENT_OTHER): Payer: Medicaid Other

## 2012-05-15 VITALS — BP 125/70 | HR 78 | Temp 97.4°F | Resp 20

## 2012-05-15 DIAGNOSIS — Z5112 Encounter for antineoplastic immunotherapy: Secondary | ICD-10-CM

## 2012-05-15 DIAGNOSIS — Z5111 Encounter for antineoplastic chemotherapy: Secondary | ICD-10-CM

## 2012-05-15 DIAGNOSIS — C01 Malignant neoplasm of base of tongue: Secondary | ICD-10-CM

## 2012-05-15 DIAGNOSIS — C029 Malignant neoplasm of tongue, unspecified: Secondary | ICD-10-CM

## 2012-05-15 DIAGNOSIS — C7951 Secondary malignant neoplasm of bone: Secondary | ICD-10-CM

## 2012-05-15 DIAGNOSIS — C7800 Secondary malignant neoplasm of unspecified lung: Secondary | ICD-10-CM

## 2012-05-15 LAB — COMPREHENSIVE METABOLIC PANEL (CC13)
ALT: 10 U/L (ref 0–55)
AST: 14 U/L (ref 5–34)
Albumin: 3.8 g/dL (ref 3.5–5.0)
Calcium: 9.6 mg/dL (ref 8.4–10.4)
Chloride: 103 mEq/L (ref 98–107)
Potassium: 4 mEq/L (ref 3.5–5.1)

## 2012-05-15 LAB — CBC WITH DIFFERENTIAL/PLATELET
BASO%: 0.8 % (ref 0.0–2.0)
HCT: 30.7 % — ABNORMAL LOW (ref 38.4–49.9)
MCHC: 31.9 g/dL — ABNORMAL LOW (ref 32.0–36.0)
MONO#: 0.3 10*3/uL (ref 0.1–0.9)
RBC: 3.35 10*6/uL — ABNORMAL LOW (ref 4.20–5.82)
WBC: 3.6 10*3/uL — ABNORMAL LOW (ref 4.0–10.3)
lymph#: 0.6 10*3/uL — ABNORMAL LOW (ref 0.9–3.3)
nRBC: 0 % (ref 0–0)

## 2012-05-15 MED ORDER — DEXAMETHASONE SODIUM PHOSPHATE 4 MG/ML IJ SOLN
20.0000 mg | Freq: Once | INTRAMUSCULAR | Status: AC
  Administered 2012-05-15: 20 mg via INTRAVENOUS

## 2012-05-15 MED ORDER — CARBOPLATIN CHEMO INJECTION 450 MG/45ML
300.0000 mg | Freq: Once | INTRAVENOUS | Status: AC
  Administered 2012-05-15: 300 mg via INTRAVENOUS
  Filled 2012-05-15: qty 30

## 2012-05-15 MED ORDER — ONDANSETRON 16 MG/50ML IVPB (CHCC)
16.0000 mg | Freq: Once | INTRAVENOUS | Status: AC
  Administered 2012-05-15: 16 mg via INTRAVENOUS

## 2012-05-15 MED ORDER — PACLITAXEL CHEMO INJECTION 300 MG/50ML
80.0000 mg/m2 | Freq: Once | INTRAVENOUS | Status: AC
  Administered 2012-05-15: 150 mg via INTRAVENOUS
  Filled 2012-05-15: qty 25

## 2012-05-15 MED ORDER — CETUXIMAB CHEMO IV INJECTION 200 MG/100ML
400.0000 mg/m2 | Freq: Once | INTRAVENOUS | Status: AC
  Administered 2012-05-15: 700 mg via INTRAVENOUS
  Filled 2012-05-15: qty 350

## 2012-05-15 MED ORDER — DIPHENHYDRAMINE HCL 50 MG/ML IJ SOLN
50.0000 mg | Freq: Once | INTRAMUSCULAR | Status: AC
  Administered 2012-05-15: 50 mg via INTRAVENOUS

## 2012-05-15 MED ORDER — FAMOTIDINE IN NACL 20-0.9 MG/50ML-% IV SOLN
20.0000 mg | Freq: Once | INTRAVENOUS | Status: AC
  Administered 2012-05-15: 20 mg via INTRAVENOUS

## 2012-05-15 MED ORDER — SODIUM CHLORIDE 0.9 % IV SOLN
Freq: Once | INTRAVENOUS | Status: AC
  Administered 2012-05-15: 11:00:00 via INTRAVENOUS

## 2012-05-15 NOTE — Patient Instructions (Addendum)
[facility] Discharge Instructions for Patients Receiving Chemotherapy  Today you received the following chemotherapy agents Taxol/Carboplatin/Erbitux  To help prevent nausea and vomiting after your treatment, we encourage you to take your nausea medication as directed by Dr Gaylyn Rong.    If you develop nausea and vomiting that is not controlled by your nausea medication, call the clinic. If it is after clinic hours your family physician or the after hours number for the clinic or go to the Emergency Department.   BELOW ARE SYMPTOMS THAT SHOULD BE REPORTED IMMEDIATELY:  *FEVER GREATER THAN 100.5 F  *CHILLS WITH OR WITHOUT FEVER  NAUSEA AND VOMITING THAT IS NOT CONTROLLED WITH YOUR NAUSEA MEDICATION  *UNUSUAL SHORTNESS OF BREATH  *UNUSUAL BRUISING OR BLEEDING  TENDERNESS IN MOUTH AND THROAT WITH OR WITHOUT PRESENCE OF ULCERS  *URINARY PROBLEMS  *BOWEL PROBLEMS  UNUSUAL RASH Items with * indicate a potential emergency and should be followed up as soon as possible.  One of the nurses will contact you 24 hours after your treatment. Please let the nurse know about any problems that you may have experienced. Feel free to call the clinic you have any questions or concerns. The clinic phone number is [phone].

## 2012-05-15 NOTE — Progress Notes (Signed)
[facility]  Clinical Social Work  Clinical Social Work met with patient in infusion room today to complete advance directives. Patient identified his son as his healthcare agent.  CSW sent documents to medical records to be scanned into patient's chart. To access, please go to Chart Review > Media Tab > look under Advance Directives.  [name] plans to contact me with any updates or questions/concerns.  Kathrin Penner, MSW, [facility] Clinical Social Worker [facility] [phone]

## 2012-05-15 NOTE — Telephone Encounter (Signed)
No note

## 2012-05-16 ENCOUNTER — Telehealth: Payer: Self-pay | Admitting: *Deleted

## 2012-05-16 ENCOUNTER — Other Ambulatory Visit: Payer: Self-pay | Admitting: *Deleted

## 2012-05-16 DIAGNOSIS — C770 Secondary and unspecified malignant neoplasm of lymph nodes of head, face and neck: Secondary | ICD-10-CM

## 2012-05-16 DIAGNOSIS — C800 Disseminated malignant neoplasm, unspecified: Secondary | ICD-10-CM

## 2012-05-16 DIAGNOSIS — C029 Malignant neoplasm of tongue, unspecified: Secondary | ICD-10-CM

## 2012-05-16 MED ORDER — HYDROCODONE-ACETAMINOPHEN 7.5-325 MG/15ML PO SOLN: 15.0000 mL | Freq: Four times a day (QID) | ORAL | Status: DC | PRN

## 2012-05-16 NOTE — Telephone Encounter (Signed)
Called patient for follow up on first treatment of Taxol/Carbo/Erbitux on 05/15/12. Patient states he actually feels good this morning. Took Zofran as preventative last night. Has completed a feeding via PEG this morning and has no complaints with nausea/vomiting/diarrhea. Complains of redness of the face, like having mild sunburn, explained to patient this could be secondary to steroids rec'd in pre-meds yesterday. Patient also warned to look out for rash caused by Erbitux. Patient verbalized understanding, no questions, and was informed to call us back should he have any further questions.

## 2012-05-16 NOTE — Telephone Encounter (Signed)
Message copied by Kathlynn Grate on Wed May 16, 2012 10:12 AM ------      Message from: [place], [place] E      Created: Tue May 15, 2012  1:22 PM       [phone], call between 9-12noon.      1st time Taxol/Carbo/Erbitux

## 2012-05-16 NOTE — Telephone Encounter (Signed)
Lortab solution 7.5/500 mg solution is no longer available per pharmacist.  Asked if this may be changed to lortab 7.5/325 mg solution.  Verbal order received and read back from Dr. Gaylyn Rong to change this solution.

## 2012-05-21 ENCOUNTER — Ambulatory Visit (HOSPITAL_BASED_OUTPATIENT_CLINIC_OR_DEPARTMENT_OTHER): Payer: Medicaid Other | Admitting: Oncology

## 2012-05-21 ENCOUNTER — Ambulatory Visit (HOSPITAL_BASED_OUTPATIENT_CLINIC_OR_DEPARTMENT_OTHER): Payer: Medicaid Other

## 2012-05-21 ENCOUNTER — Other Ambulatory Visit (HOSPITAL_BASED_OUTPATIENT_CLINIC_OR_DEPARTMENT_OTHER): Payer: Medicaid Other | Admitting: Oncology

## 2012-05-21 ENCOUNTER — Encounter: Payer: Self-pay | Admitting: Oncology

## 2012-05-21 ENCOUNTER — Other Ambulatory Visit (HOSPITAL_BASED_OUTPATIENT_CLINIC_OR_DEPARTMENT_OTHER): Payer: Medicaid Other | Admitting: Lab

## 2012-05-21 VITALS — BP 119/67 | HR 90 | Temp 98.0°F | Resp 20 | Ht 73.0 in | Wt 145.8 lb

## 2012-05-21 DIAGNOSIS — C7800 Secondary malignant neoplasm of unspecified lung: Secondary | ICD-10-CM

## 2012-05-21 DIAGNOSIS — C080 Malignant neoplasm of submandibular gland: Secondary | ICD-10-CM

## 2012-05-21 DIAGNOSIS — L270 Generalized skin eruption due to drugs and medicaments taken internally: Secondary | ICD-10-CM

## 2012-05-21 DIAGNOSIS — C770 Secondary and unspecified malignant neoplasm of lymph nodes of head, face and neck: Secondary | ICD-10-CM

## 2012-05-21 DIAGNOSIS — C029 Malignant neoplasm of tongue, unspecified: Secondary | ICD-10-CM

## 2012-05-21 DIAGNOSIS — Z5111 Encounter for antineoplastic chemotherapy: Secondary | ICD-10-CM

## 2012-05-21 DIAGNOSIS — C7951 Secondary malignant neoplasm of bone: Secondary | ICD-10-CM

## 2012-05-21 DIAGNOSIS — C7952 Secondary malignant neoplasm of bone marrow: Secondary | ICD-10-CM

## 2012-05-21 LAB — CBC WITH DIFFERENTIAL/PLATELET
Basophils Absolute: 0 10*3/uL (ref 0.0–0.1)
EOS%: 4.8 % (ref 0.0–7.0)
HGB: 9.5 g/dL — ABNORMAL LOW (ref 13.0–17.1)
MCH: 29.7 pg (ref 27.2–33.4)
MCV: 91.3 fL (ref 79.3–98.0)
MONO%: 6.6 % (ref 0.0–14.0)
RBC: 3.2 10*6/uL — ABNORMAL LOW (ref 4.20–5.82)
RDW: 12.9 % (ref 11.0–14.6)

## 2012-05-21 LAB — COMPREHENSIVE METABOLIC PANEL (CC13)
AST: 27 U/L (ref 5–34)
Albumin: 3.8 g/dL (ref 3.5–5.0)
Alkaline Phosphatase: 64 U/L (ref 40–150)
BUN: 12 mg/dL (ref 7.0–26.0)
Potassium: 4.1 mEq/L (ref 3.5–5.1)

## 2012-05-21 MED ORDER — DIPHENHYDRAMINE HCL 50 MG/ML IJ SOLN
50.0000 mg | Freq: Once | INTRAMUSCULAR | Status: AC
  Administered 2012-05-21: 50 mg via INTRAVENOUS

## 2012-05-21 MED ORDER — SODIUM CHLORIDE 0.9 % IV SOLN
Freq: Once | INTRAVENOUS | Status: AC
  Administered 2012-05-21: 11:00:00 via INTRAVENOUS

## 2012-05-21 MED ORDER — CLOBETASOL PROPIONATE 0.05 % EX CREA: TOPICAL_CREAM | Freq: Two times a day (BID) | CUTANEOUS | Status: DC

## 2012-05-21 MED ORDER — ONDANSETRON 16 MG/50ML IVPB (CHCC)
16.0000 mg | Freq: Once | INTRAVENOUS | Status: AC
  Administered 2012-05-21: 16 mg via INTRAVENOUS

## 2012-05-21 MED ORDER — DEXAMETHASONE SODIUM PHOSPHATE 4 MG/ML IJ SOLN
20.0000 mg | Freq: Once | INTRAMUSCULAR | Status: AC
  Administered 2012-05-21: 20 mg via INTRAVENOUS

## 2012-05-21 MED ORDER — FAMOTIDINE IN NACL 20-0.9 MG/50ML-% IV SOLN
20.0000 mg | Freq: Once | INTRAVENOUS | Status: AC
  Administered 2012-05-21: 20 mg via INTRAVENOUS

## 2012-05-21 MED ORDER — CETUXIMAB CHEMO IV INJECTION 200 MG/100ML
250.0000 mg/m2 | Freq: Once | INTRAVENOUS | Status: AC
  Administered 2012-05-21: 500 mg via INTRAVENOUS
  Filled 2012-05-21: qty 250

## 2012-05-21 MED ORDER — SODIUM CHLORIDE 0.9 % IV SOLN
300.0000 mg | Freq: Once | INTRAVENOUS | Status: AC
  Administered 2012-05-21: 300 mg via INTRAVENOUS
  Filled 2012-05-21: qty 30

## 2012-05-21 MED ORDER — PACLITAXEL CHEMO INJECTION 300 MG/50ML
80.0000 mg/m2 | Freq: Once | INTRAVENOUS | Status: AC
  Administered 2012-05-21: 150 mg via INTRAVENOUS
  Filled 2012-05-21: qty 25

## 2012-05-21 NOTE — Patient Instructions (Addendum)
[facility] Discharge Instructions for Patients Receiving Chemotherapy  Today you received the following chemotherapy agents Erbitux, Taxol and Carbo.  To help prevent nausea and vomiting after your treatment, we encourage you to take your nausea medication. Begin taking your nausea medication as often as prescribed for by Dr. Gaylyn Rong.    If you develop nausea and vomiting that is not controlled by your nausea medication, call the clinic. If it is after clinic hours your family physician or the after hours number for the clinic or go to the Emergency Department.   BELOW ARE SYMPTOMS THAT SHOULD BE REPORTED IMMEDIATELY:  *FEVER GREATER THAN 100.5 F  *CHILLS WITH OR WITHOUT FEVER  NAUSEA AND VOMITING THAT IS NOT CONTROLLED WITH YOUR NAUSEA MEDICATION  *UNUSUAL SHORTNESS OF BREATH  *UNUSUAL BRUISING OR BLEEDING  TENDERNESS IN MOUTH AND THROAT WITH OR WITHOUT PRESENCE OF ULCERS  *URINARY PROBLEMS  *BOWEL PROBLEMS  UNUSUAL RASH Items with * indicate a potential emergency and should be followed up as soon as possible.  One of the nurses will contact you 24 hours after your treatment. Please let the nurse know about any problems that you may have experienced. Feel free to call the clinic you have any questions or concerns. The clinic phone number is [phone].   I have been informed and understand all the instructions given to me. I know to contact the clinic, my physician, or go to the Emergency Department if any problems should occur. I do not have any questions at this time, but understand that I may call the clinic during office hours   should I have any questions or need assistance in obtaining follow up care.    __________________________________________  _____________  __________ Signature of Patient or Authorized Representative            Date                   Time    __________________________________________ Nurse's Signature

## 2012-05-21 NOTE — Patient Instructions (Addendum)
Use moisturizer such as Lubriderm or Eucerin twice a day. Use sunscreen to all exposed skin. I have prescribed Clobetasol cream to be applied to your rash on the chest and back twice a day.

## 2012-05-21 NOTE — Progress Notes (Signed)
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  He now developed biopsy proven metastatic disease in 03/2012 with met in right neck, left lung mass and left illac.   PAST THERAPY:  1. 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 He developed recurrence in the left submandibular gland in August 2012. He underwent on 07/27/2011 radical left neck dissection by Dr. Ezzard Standing and Dr. Geryl Rankins. Pathology case number [id number] showed total 15 lymph nodes from left radical neck dissection that was all negative. However the left submandibular gland mass showed a 3.5 cm invasive SCC, moderately differentiates involving the adjacent skeletal muscle tissue and bone a tissue and extending into the inked margin. There was no evidence of any lymphatic invasion or perineural invasion. He underwent further resection by Dr. Mary Sella at [facility] on 09/29/2011.  2. He received adjuvant chemoradiation therapy with q 3 week Cisplatin given 11/15/11-12/05/11 (received 2 of 3 doses; 3rd dose held due to febrile neutropenia and development of flap abscess). He received concurrent XRT 11/15/11 through 01/05/12.  3.  For right neck recurrence in 03/2012; he underwent palliative right neck dissection by Dr. Manson Passey at [facility].   CURRENT THERAPY: Palliative chemotherapy with Carboplatin/Taxol/Erbitux started on 05/03/12.  INTERVAL HISTORY: [name] 51 y.o. male returns for regular follow up with his father.  He continues to have mild fatigue. He is still able to live by himself and is independent of all activities of daily living.  He still is not able to eat regular foods by mouth due to dysphagia.  He uses PEG tube for about 4-5 cans of Osmolyte a day.  He  is able to drink by mouth and eat puree foods.  He denied SOB, hemoptysis, left iliac bone pain, lower extremity weakness, lower extremity paraesthesia, bowel/bladder incontinence. He has not noted any rashes. He denies neuropathic pain to his feet an fingertips.  Patient denies fever, anorexia, headache, visual changes, confusion, drenching night sweats, palpable lymph node swelling, mucositis, odynophagia, dysphagia, nausea vomiting, jaundice, chest pain, palpitation, shortness of breath, dyspnea on exertion, productive cough, gum bleeding, epistaxis, hematemesis, hemoptysis, abdominal pain, abdominal swelling, early satiety, melena, hematochezia, hematuria, skin rash, spontaneous bleeding, joint swelling, joint pain, heat or cold intolerance, bowel bladder incontinence, back pain, focal motor weakness, paresthesia, depression, suicidal or homicidal ideation, feeling hopelessness.   Past Medical History  Diagnosis Date  . Hemorrhoids   . PONV (postoperative nausea and vomiting)   . Hypertension     no medications at present  . GERD (gastroesophageal reflux disease)     no medication  . Depression     treated approx 12 years ago, no treatment at this time  . Hiatal hernia   . Cancer 12/2010    left side of tongue T1 lesion excised 4/12  . Neutropenia 12/23/2011  . Abscess 12/27/2011  . Hx of radiation therapy 11/15/11 -01/05/12    left lateral tongue  . Lesion of left lung 03/14/12    CT chest, [facility], squamous cell carcinoma  . Bone metastasis 04/25/12    PET scan-left iliac bone    Past Surgical History  Procedure Date  . Inguinal herniorrhapy   . Tonsillectomy and adenoidectomy   . Excision of tongue mass  April 2012  . Hernia repair   . Neck mass excision 07/27/2011  . Radical neck dissection 07/27/2011    Procedure: RADICAL NECK DISSECTION;  Surgeon: Drema Halon, MD;  Location: [facility] OR;  Service: ENT;  Laterality: Left;  Marland Kitchen Mandibulectomy 09/2011    and scapular free flap    . Modified radical neck dissection 03/27/12    excision of lip lesion, lip benign, right neck squamous cell ca    Current Outpatient Prescriptions  Medication Sig Dispense Refill  . clobetasol cream (TEMOVATE) 0.05 % Apply topically 2 (two) times daily. Apply to rash BID  60 g  2  . hydrocodone-acetaminophen (HYCET) 7.5-325 MG/15ML solution Take 15 mLs by mouth every 6 (six) hours as needed for pain.  480 mL  0  . ibuprofen (ADVIL,MOTRIN) 200 MG tablet Take 400 mg by mouth every 6 (six) hours as needed. Pain       . LORazepam (ATIVAN) 0.5 MG tablet Take 0.5 mg by mouth every 8 (eight) hours as needed.      Marland Kitchen omeprazole (PRILOSEC) 10 MG capsule Take 10 mg by mouth daily.       No current facility-administered medications for this visit.   Facility-Administered Medications Ordered in Other Visits  Medication Dose Route Frequency Provider Last Rate Last Dose  . 0.9 %  sodium chloride infusion   Intravenous Once Exie Parody, MD 20 mL/hr at 05/21/12 1115    . CARBOplatin (PARAPLATIN) 300 mg in sodium chloride 0.9 % 100 mL chemo infusion  300 mg Intravenous Once Exie Parody, MD      . cetuximab (ERBITUX) chemo infusion 500 mg  250 mg/m2 (Treatment Plan Actual) Intravenous Once Exie Parody, MD      . dexamethasone (DECADRON) injection 20 mg  20 mg Intravenous Once Exie Parody, MD   20 mg at 05/21/12 1125  . diphenhydrAMINE (BENADRYL) injection 50 mg  50 mg Intravenous Once Exie Parody, MD   50 mg at 05/21/12 1125  . famotidine (PEPCID) IVPB 20 mg  20 mg Intravenous Once Exie Parody, MD   20 mg at 05/21/12 1142  . ondansetron (ZOFRAN) IVPB 16 mg  16 mg Intravenous Once Exie Parody, MD   16 mg at 05/21/12 1125  . PACLitaxel (TAXOL) 150 mg in dextrose 5 % 250 mL chemo infusion (</= 80mg /m2)  80 mg/m2 (Treatment Plan Actual) Intravenous Once Exie Parody, MD        ALLERGIES:  is allergic to codeine phosphate and tape.  REVIEW OF SYSTEMS:  The rest of the 14-point review of system was negative.   Filed Vitals:    05/21/12 0928  BP: 119/67  Pulse: 90  Temp: 98 F (36.7 C)  Resp: 20   Wt Readings from Last 3 Encounters:  05/21/12 145 lb 12.8 oz (66.134 kg)  05/03/12 148 lb 6.4 oz (67.314 kg)  05/02/12 149 lb (67.586 kg)   ECOG Performance status: 1  PHYSICAL EXAMINATION:   General: thin-appearing man, in no acute distress. Eyes: no scleral icterus. ENT: There were no oropharyngeal lesions on my unaided exam. Neck was without thyromegaly. There was a flap in the left jaw which has healed.  Right neck was fibrotic from recent resection.  There was no erythema, purulent discharge, pain on palpation. Lymphatics: positive for 3cm right level III/IV node. Respiratory: lungs were clear bilaterally without wheezing or crackles. Cardiovascular: Regular rate and rhythm, S1/S2, without murmur, rub or gallop. There was no  pedal edema. GI: abdomen was soft, flat, nontender, nondistended, without organomegaly. PEG tube was dry, clean, intact. Muscoloskeletal: no spinal tenderness of palpation of vertebral spine. Skin exam was without echymosis, petichae. Faint maculopapular rash to chest and upper back. Neuro exam was nonfocal. Patient was able to get on and off exam table without assistance. Gait was normal. Patient was alerted and oriented. Attention was good. Language was appropriate. Mood was normal without depression. Speech was not pressured. Thought content was not tangential.    LABORATORY/RADIOLOGY DATA:  Lab Results  Component Value Date   WBC 2.7* 05/21/2012   HGB 9.5* 05/21/2012   HCT 29.2* 05/21/2012   PLT 203 05/21/2012   GLUCOSE 101* 05/15/2012   CHOL 160 02/14/2008   TRIG 442* 02/14/2008   HDL 26.5* 02/14/2008   LDLDIRECT 64.8 02/14/2008   ALKPHOS 64 05/15/2012   ALT 10 05/15/2012   AST 14 05/15/2012   NA 141 05/15/2012   K 4.0 05/15/2012   CL 103 05/15/2012   CREATININE 0.7 05/15/2012   BUN 12.0 05/15/2012   CO2 28 05/15/2012    ASSESSMENT AND PLAN:   1. Smoking: none at this time.   2. Recurrent left lateral  tongue squamous cell carcinoma: He now has metastatic disease.    Left lung mass and left iliac bone lesion:  Both active on PET scan.  I have high clinical suspicion for metastatic disease from head/neck cancer.   His left lung mass was biopsied in 03/2012 with squamous cell.  I have high pretest probability that he has metastatic disease.  He does not want left iliac biopsy either.   On palliative chemotherapy with Carboplatin/Taxol/Erbitux 3 weeks on and 1 week off. He has grade 1 rash which is not dose limiting. Recommend that he proceed with chemotherapy today without dose modification. Plan to repeat scan after 3 months to assess response.   3. Anemia:  This is from anemia of chronic disease.  Hemoglobin is stable. No active bleeding. No transfusion indicated.   4. Calorie/Protein malnutrition: stable weight. 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   5. Mouth pain from treatment: He is on Vicodin prn.   6. Rash from Erbitux: Discussed skin care with the patient. Recommend that he begin using moisturizer and sunscreen routinely. I have recommended that he use Hydrocortisone cream to the rash on his chest and back BID. May need Doxycycline if rash worsens.   7. Follow up: 1 week. We will see him once a week during the first week to ensure toleration before spacing visit out to once monthly.    The length of time of the face-to-face encounter was 15 minutes. More than 50% of time was spent counseling and coordination of care.

## 2012-05-22 ENCOUNTER — Other Ambulatory Visit: Payer: Self-pay | Admitting: Certified Registered Nurse Anesthetist

## 2012-05-22 ENCOUNTER — Encounter: Payer: Self-pay | Admitting: Oncology

## 2012-05-22 NOTE — Progress Notes (Signed)
Put disability form on nurse's desk. °

## 2012-05-24 ENCOUNTER — Telehealth: Payer: Self-pay | Admitting: *Deleted

## 2012-05-24 ENCOUNTER — Encounter: Payer: Self-pay | Admitting: *Deleted

## 2012-05-24 ENCOUNTER — Other Ambulatory Visit: Payer: Self-pay | Admitting: Oncology

## 2012-05-24 ENCOUNTER — Encounter: Payer: Self-pay | Admitting: Oncology

## 2012-05-24 DIAGNOSIS — R21 Rash and other nonspecific skin eruption: Secondary | ICD-10-CM

## 2012-05-24 DIAGNOSIS — C029 Malignant neoplasm of tongue, unspecified: Secondary | ICD-10-CM

## 2012-05-24 HISTORY — DX: Rash and other nonspecific skin eruption: R21

## 2012-05-24 MED ORDER — HYDROCORTISONE 0.5 % EX CREA: TOPICAL_CREAM | Freq: Two times a day (BID) | CUTANEOUS | Status: DC | PRN

## 2012-05-24 MED ORDER — CLINDAMYCIN PHOSPHATE 1 % EX SOLN: Freq: Two times a day (BID) | CUTANEOUS | Status: DC

## 2012-05-24 MED ORDER — DOXYCYCLINE HYCLATE 100 MG PO TABS: 100.0000 mg | ORAL_TABLET | Freq: Two times a day (BID) | ORAL | Status: AC

## 2012-05-24 NOTE — Telephone Encounter (Signed)
1. Please advised him talk to a pharmacist to get a OTC hydrocortisone cream that is OK to use on face twice a day prn.   2.  I've prescribed via his listed pharmacy:  Cleocin T cream (twice a day alternating with Hydrocortisone;  and Doxycycline 100mg  PO BID.   Thanks.

## 2012-05-24 NOTE — Progress Notes (Signed)
Disability form signed by Dr. Gaylyn Rong and returned to Axel Filler in [facility] dept.Marland Kitchen

## 2012-05-24 NOTE — Telephone Encounter (Signed)
Pt reports ongoing rash to chest and back since Monday seen by Belenda Cruise NP and using clobetasol BID as directed.  New Rash on Face started yesterday w/ white heads and feels painful like a "wind burn."  Clobetasol says not to use on Face.  Pt asks if he can use on face for this new rash?  He is using Biafine gel he got from XRT, is this okay on facial rash? Informed pt facial rash can be caused by Erbitux.  I will notify Dr. Gaylyn Rong and call him back with instructions.

## 2012-05-24 NOTE — Telephone Encounter (Signed)
Called pt back with instructions on new prescriptions as directed by Dr. Gaylyn Rong.  Pt verbalized understanding.

## 2012-05-25 ENCOUNTER — Other Ambulatory Visit: Payer: Self-pay | Admitting: Oncology

## 2012-05-25 ENCOUNTER — Other Ambulatory Visit: Payer: Self-pay | Admitting: *Deleted

## 2012-05-25 DIAGNOSIS — C800 Disseminated malignant neoplasm, unspecified: Secondary | ICD-10-CM

## 2012-05-25 DIAGNOSIS — C770 Secondary and unspecified malignant neoplasm of lymph nodes of head, face and neck: Secondary | ICD-10-CM

## 2012-05-25 DIAGNOSIS — C029 Malignant neoplasm of tongue, unspecified: Secondary | ICD-10-CM

## 2012-05-25 MED ORDER — HYDROCODONE-ACETAMINOPHEN 7.5-500 MG/15ML PO SOLN: 15.0000 mL | Freq: Four times a day (QID) | ORAL | Status: DC | PRN

## 2012-05-25 MED ORDER — HYDROCODONE-ACETAMINOPHEN 7.5-325 MG/15ML PO SOLN: 15.0000 mL | Freq: Four times a day (QID) | ORAL | Status: DC | PRN

## 2012-05-25 NOTE — Telephone Encounter (Signed)
Pt called to request refill on liquid hydrocodone.  Called in refill to [facility] and notified pt.

## 2012-05-27 NOTE — Patient Instructions (Addendum)
1.  Diagnosis: metastatic head/neck cancer.  2.  Treatment:  Weekly Carboplatin/Taxol/Erbitux; 3 weeks on, 1 week off. 3.  Skin rash:  Due to Erbitux  - Hold off on Erbitux this week until much better.    - Take Doxycyclin 100mg  by mouth twice daily, cleocin T gel alternating with hydrocortisone cream 4 times a day.

## 2012-05-28 ENCOUNTER — Ambulatory Visit: Payer: Medicaid Other | Admitting: Nutrition

## 2012-05-28 ENCOUNTER — Ambulatory Visit (HOSPITAL_BASED_OUTPATIENT_CLINIC_OR_DEPARTMENT_OTHER): Payer: Medicaid Other | Admitting: Oncology

## 2012-05-28 ENCOUNTER — Ambulatory Visit (HOSPITAL_BASED_OUTPATIENT_CLINIC_OR_DEPARTMENT_OTHER): Payer: Medicaid Other

## 2012-05-28 ENCOUNTER — Ambulatory Visit: Payer: Self-pay

## 2012-05-28 ENCOUNTER — Other Ambulatory Visit: Payer: Self-pay | Admitting: *Deleted

## 2012-05-28 ENCOUNTER — Telehealth: Payer: Self-pay | Admitting: Oncology

## 2012-05-28 ENCOUNTER — Other Ambulatory Visit (HOSPITAL_BASED_OUTPATIENT_CLINIC_OR_DEPARTMENT_OTHER): Payer: Medicaid Other

## 2012-05-28 VITALS — BP 130/80 | HR 97 | Temp 97.7°F | Resp 20 | Ht 73.0 in | Wt 145.5 lb

## 2012-05-28 DIAGNOSIS — C080 Malignant neoplasm of submandibular gland: Secondary | ICD-10-CM

## 2012-05-28 DIAGNOSIS — C7952 Secondary malignant neoplasm of bone marrow: Secondary | ICD-10-CM

## 2012-05-28 DIAGNOSIS — C7951 Secondary malignant neoplasm of bone: Secondary | ICD-10-CM

## 2012-05-28 DIAGNOSIS — R21 Rash and other nonspecific skin eruption: Secondary | ICD-10-CM

## 2012-05-28 DIAGNOSIS — C7800 Secondary malignant neoplasm of unspecified lung: Secondary | ICD-10-CM

## 2012-05-28 DIAGNOSIS — C029 Malignant neoplasm of tongue, unspecified: Secondary | ICD-10-CM

## 2012-05-28 DIAGNOSIS — Z5111 Encounter for antineoplastic chemotherapy: Secondary | ICD-10-CM

## 2012-05-28 DIAGNOSIS — D638 Anemia in other chronic diseases classified elsewhere: Secondary | ICD-10-CM

## 2012-05-28 LAB — COMPREHENSIVE METABOLIC PANEL (CC13)
AST: 19 U/L (ref 5–34)
Albumin: 3.8 g/dL (ref 3.5–5.0)
BUN: 8 mg/dL (ref 7.0–26.0)
CO2: 28 mEq/L (ref 22–29)
Calcium: 9.4 mg/dL (ref 8.4–10.4)
Chloride: 102 mEq/L (ref 98–107)
Glucose: 81 mg/dl (ref 70–99)
Potassium: 3.6 mEq/L (ref 3.5–5.1)

## 2012-05-28 LAB — CBC WITH DIFFERENTIAL/PLATELET
Basophils Absolute: 0 10*3/uL (ref 0.0–0.1)
Eosinophils Absolute: 0.1 10*3/uL (ref 0.0–0.5)
HCT: 27.4 % — ABNORMAL LOW (ref 38.4–49.9)
HGB: 8.7 g/dL — ABNORMAL LOW (ref 13.0–17.1)
MONO#: 0.2 10*3/uL (ref 0.1–0.9)
NEUT#: 2.2 10*3/uL (ref 1.5–6.5)
RDW: 13.2 % (ref 11.0–14.6)
lymph#: 0.5 10*3/uL — ABNORMAL LOW (ref 0.9–3.3)

## 2012-05-28 MED ORDER — FAMOTIDINE IN NACL 20-0.9 MG/50ML-% IV SOLN
20.0000 mg | Freq: Once | INTRAVENOUS | Status: AC
  Administered 2012-05-28: 20 mg via INTRAVENOUS

## 2012-05-28 MED ORDER — SODIUM CHLORIDE 0.9 % IV SOLN
300.0000 mg | Freq: Once | INTRAVENOUS | Status: AC
  Administered 2012-05-28: 300 mg via INTRAVENOUS
  Filled 2012-05-28: qty 30

## 2012-05-28 MED ORDER — PACLITAXEL CHEMO INJECTION 300 MG/50ML
80.0000 mg/m2 | Freq: Once | INTRAVENOUS | Status: AC
  Administered 2012-05-28: 150 mg via INTRAVENOUS
  Filled 2012-05-28: qty 25

## 2012-05-28 MED ORDER — SODIUM CHLORIDE 0.9 % IV SOLN
Freq: Once | INTRAVENOUS | Status: AC
  Administered 2012-05-28: 10:00:00 via INTRAVENOUS

## 2012-05-28 MED ORDER — ONDANSETRON 16 MG/50ML IVPB (CHCC)
16.0000 mg | Freq: Once | INTRAVENOUS | Status: AC
  Administered 2012-05-28: 16 mg via INTRAVENOUS

## 2012-05-28 MED ORDER — DEXAMETHASONE SODIUM PHOSPHATE 4 MG/ML IJ SOLN
20.0000 mg | Freq: Once | INTRAMUSCULAR | Status: AC
  Administered 2012-05-28: 20 mg via INTRAVENOUS

## 2012-05-28 MED ORDER — DIPHENHYDRAMINE HCL 50 MG/ML IJ SOLN
50.0000 mg | Freq: Once | INTRAMUSCULAR | Status: AC
  Administered 2012-05-28: 50 mg via INTRAVENOUS

## 2012-05-28 MED ORDER — CLINDAMYCIN PHOSPHATE 1 % EX GEL: Freq: Two times a day (BID) | CUTANEOUS | Status: DC

## 2012-05-28 NOTE — Progress Notes (Signed)
Thomas Bean has metastatic cancer, status post neck dissection.  He is now receiving palliative chemotherapy to include carbo/Taxol and Erbitux.  He reports he continues to utilize Osmolite 1.5 via PEG approximately 6 cans daily, along with some protein powder.  He is receiving approximately 2330 calories, 149 g of protein, and 2586 mL of free water via tube.  In addition, he drinks some water by mouth.  He complains of reflux when he uses protein powder.  He often uses MiraLAX to help with constipation.  Current weight is 145.5 pounds documented September 16th, which has decreased from 162.8 pounds July 8th.  Current BMI is 19.2.  NUTRITION DIAGNOSIS:  Inadequate oral intake continues.  Diagnosis of less than optimal enteral nutrition infusion has resolved.  INTERVENTION:  I have educated the patient to continue 6 cans of Osmolite 1.5 with approximately 1 to 1-1/2 scoops of protein powder daily.  He is to continue to flush his feeding tube as tolerated, along with the free water by mouth to meet 100% of his estimated needs to promote weight maintenance.  I have answered his questions today and given him strategies for decreasing reflux when using protein supplement.   MONITORING/EVALUATION/GOALS:  The patient tolerates tube feeding at goal rate, however, has continued to have weight loss.  He will work to consistently utilize Osmolite 1.5 at goal rate to meet 100% of his estimated needs to minimize further weight loss.  NEXT VISIT:  Monday, September 30th during chemotherapy.   ______________________________ Zenovia Jarred, RD, CSO, LDN Clinical Nutrition Specialist BN/MEDQ  D:  05/28/2012  T:  05/28/2012  Job:  [id number]

## 2012-05-28 NOTE — Patient Instructions (Addendum)
[facility] Discharge Instructions for Patients Receiving Chemotherapy  Today you received the following chemotherapy agents  Taxol, Carboplatin. The erbitux was not given because of rash.  To help prevent nausea and vomiting after your treatment, we encourage you to take your nausea medication if needed. Begin taking it at 4pm and take it as often as prescribed.   If you develop nausea and vomiting that is not controlled by your nausea medication, call the clinic. If it is after clinic hours your family physician or the after hours number for the clinic or go to the Emergency Department.   BELOW ARE SYMPTOMS THAT SHOULD BE REPORTED IMMEDIATELY:  *FEVER GREATER THAN 100.5 F  *CHILLS WITH OR WITHOUT FEVER  NAUSEA AND VOMITING THAT IS NOT CONTROLLED WITH YOUR NAUSEA MEDICATION  *UNUSUAL SHORTNESS OF BREATH  *UNUSUAL BRUISING OR BLEEDING  TENDERNESS IN MOUTH AND THROAT WITH OR WITHOUT PRESENCE OF ULCERS  *URINARY PROBLEMS  *BOWEL PROBLEMS  UNUSUAL RASH Items with * indicate a potential emergency and should be followed up as soon as possible.  One of the nurses will contact you 24 hours after your treatment. Please let the nurse know about any problems that you may have experienced. Feel free to call the clinic you have any questions or concerns. The clinic phone number is [phone].   I have been informed and understand all the instructions given to me. I know to contact the clinic, my physician, or go to the Emergency Department if any problems should occur. I do not have any questions at this time, but understand that I may call the clinic during office hours   should I have any questions or need assistance in obtaining follow up care.    __________________________________________  _____________  __________ Signature of Patient or Authorized Representative            Date                   Time    __________________________________________ Nurse's  Signature

## 2012-05-28 NOTE — Progress Notes (Signed)
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 He now developed biopsy proven metastatic disease in 03/2012 with met in right neck, left lung mass and left illac.   PAST THERAPY:  1. 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 He developed recurrence in the left submandibular gland in August 2012. He underwent on 07/27/2011 radical left neck dissection by Dr. Ezzard Standing and Dr. Geryl Rankins. Pathology case number [id number] showed total 15 lymph nodes from left radical neck dissection that was all negative. However the left submandibular gland mass showed a 3.5 cm invasive SCC, moderately differentiates involving the adjacent skeletal muscle tissue and bone a tissue and extending into the inked margin. There was no evidence of any lymphatic invasion or perineural invasion. He underwent further resection by Dr. Mary Sella at [facility] on 09/29/2011.  2. He received adjuvant chemoradiation therapy with q 3 week Cisplatin given 11/15/11-12/05/11 (received 2 of 3 doses; 3rd dose held due to febrile neutropenia and development of flap abscess). He received concurrent XRT 11/15/11 through 01/05/12.  3. For right neck recurrence in 03/2012; he underwent palliative right neck dissection by Dr. Manson Passey at [facility]   CURRENT THERAPY: started on 05/15/2012  palliative chemo weekly Carboplatin/Taxol/Erbitux; 3 weeks on; 1 week off   INTERVAL HISTORY: EMILLIANO Bean 51 y.o. male returns for regular follow up with his father.  He developed skin rash on the face, chest last week.  He started taking Cleocin T solution, Hydrocortisone, and Doxycycline with some improvement.  He still has dysphagia from past resection and radiation. He uses PEG tube for feeding.   His weight is relatively stable.  He has mild fatigue; however, he is still independent of activities of daily living.  Patient denies fever, anorexia, weight loss, headache, visual changes, confusion, drenching night sweats, palpable lymph node swelling, mucositis, odynophagia, dysphagia, nausea vomiting, jaundice, chest pain, palpitation, shortness of breath, dyspnea on exertion, productive cough, gum bleeding, epistaxis, hematemesis, hemoptysis, abdominal pain, abdominal swelling, early satiety, melena, hematochezia, hematuria, skin rash, spontaneous bleeding, joint swelling, joint pain, heat or cold intolerance, bowel bladder incontinence, back pain, focal motor weakness, paresthesia, depression, suicidal or homicidal ideation, feeling hopelessness.   Past Medical History  Diagnosis Date  . Hemorrhoids   . PONV (postoperative nausea and vomiting)   . Hypertension     no medications at present  . GERD (gastroesophageal reflux disease)     no medication  . Depression     treated approx 12 years ago, no treatment at this time  . Hiatal hernia   . Cancer 12/2010    left side of tongue T1 lesion excised 4/12  . Neutropenia 12/23/2011  . Abscess 12/27/2011  . Hx of radiation therapy 11/15/11 -01/05/12    left lateral tongue  . Lesion of left lung 03/14/12    CT chest, [facility], squamous cell carcinoma  . Bone metastasis 04/25/12    PET scan-left iliac bone  . Facial rash 05/24/2012    Past Surgical History  Procedure Date  . Inguinal herniorrhapy   . Tonsillectomy and adenoidectomy   . Excision of tongue mass     April 2012  . Hernia repair   . Neck mass excision 07/27/2011  . Radical neck dissection 07/27/2011  Procedure: RADICAL NECK DISSECTION;  Surgeon: Drema Halon, MD;  Location: [facility] OR;  Service: ENT;  Laterality: Left;  Marland Kitchen Mandibulectomy 09/2011    and scapular free flap  . Modified radical neck dissection 03/27/12    excision of lip lesion, lip benign, right neck squamous  cell ca    Current Outpatient Prescriptions  Medication Sig Dispense Refill  . clobetasol cream (TEMOVATE) 0.05 % Apply topically 2 (two) times daily. Apply to rash BID  60 g  2  . doxycycline (VIBRA-TABS) 100 MG tablet Take 1 tablet (100 mg total) by mouth 2 (two) times daily.  60 tablet  3  . gabapentin (NEURONTIN) 300 MG capsule Take 300 mg by mouth at bedtime.      . hydrocodone-acetaminophen (HYCET) 7.5-325 MG/15ML solution Take 15 mLs by mouth every 6 (six) hours as needed for pain.  480 mL  0  . hydrocortisone cream 0.5 % Apply topically 2 (two) times daily as needed. Apply to facial rash twice daily as needed.  30 g  0  . ibuprofen (ADVIL,MOTRIN) 200 MG tablet Take 400 mg by mouth every 6 (six) hours as needed. Pain       . LORazepam (ATIVAN) 0.5 MG tablet Take 0.5 mg by mouth every 8 (eight) hours as needed.      Marland Kitchen omeprazole (PRILOSEC) 10 MG capsule Take 10 mg by mouth daily.      . ondansetron (ZOFRAN) 8 MG tablet Take 8 mg by mouth every 12 (twelve) hours as needed.      . prochlorperazine (COMPAZINE) 10 MG tablet Take 10 mg by mouth every 6 (six) hours as needed.      . clindamycin (CLEOCIN-T) 1 % gel Apply topically 2 (two) times daily. Apply to facial and chest skin rash.  30 g  3   No current facility-administered medications for this visit.   Facility-Administered Medications Ordered in Other Visits  Medication Dose Route Frequency Provider Last Rate Last Dose  . 0.9 %  sodium chloride infusion   Intravenous Once Exie Parody, MD      . CARBOplatin (PARAPLATIN) 300 mg in sodium chloride 0.9 % 100 mL chemo infusion  300 mg Intravenous Once Exie Parody, MD   300 mg at 05/28/12 1207  . dexamethasone (DECADRON) injection 20 mg  20 mg Intravenous Once Exie Parody, MD   20 mg at 05/28/12 1029  . diphenhydrAMINE (BENADRYL) injection 50 mg  50 mg Intravenous Once Exie Parody, MD   50 mg at 05/28/12 1029  . famotidine (PEPCID) IVPB 20 mg  20 mg Intravenous Once Exie Parody, MD   20 mg at  05/28/12 1045  . ondansetron (ZOFRAN) IVPB 16 mg  16 mg Intravenous Once Exie Parody, MD   16 mg at 05/28/12 1029  . PACLitaxel (TAXOL) 150 mg in dextrose 5 % 250 mL chemo infusion (</= 80mg /m2)  80 mg/m2 (Treatment Plan Actual) Intravenous Once Exie Parody, MD   150 mg at 05/28/12 1102    ALLERGIES:  is allergic to codeine phosphate and tape.  REVIEW OF SYSTEMS:  The rest of the 14-point review of system was negative.   Filed Vitals:   05/28/12 0854  BP: 130/80  Pulse: 97  Temp: 97.7 F (36.5 C)  Resp: 20   Wt Readings from Last 3 Encounters:  05/28/12 145 lb 8 oz (65.998 kg)  05/21/12 145 lb 12.8 oz (66.134 kg)  05/03/12 148 lb 6.4 oz (67.314 kg)  ECOG Performance status: 1  PHYSICAL EXAMINATION:   General: thin-appearing man, in no acute distress. Eyes: no scleral icterus. ENT: There were no oropharyngeal lesions on my unaided exam. Neck was without thyromegaly. There was a flap in the left jaw which has healed. Right neck was fibrotic from recent resection. There was no erythema, purulent discharge, pain on palpation. Lymphatics: positive for 3cm right level III/IV node. Respiratory: lungs were clear bilaterally without wheezing or crackles. Cardiovascular: Regular rate and rhythm, S1/S2, without murmur, rub or gallop. There was no pedal edema. GI: abdomen was soft, flat, nontender, nondistended, without organomegaly. PEG tube was dry, clean, intact. Muscoloskeletal: no spinal tenderness of palpation of vertebral spine. Skin exam was without echymosis, petichae. There was aciniform skin rash on the face, trunk, and scalp. Neuro exam was nonfocal. Patient was able to get on and off exam table without assistance. Gait was normal. Patient was alerted and oriented. Attention was good. Language was appropriate. Mood was normal without depression. Speech was not pressured. Thought content was not tangential.      LABORATORY/RADIOLOGY DATA:  Lab Results  Component Value Date   WBC 3.0*  05/28/2012   HGB 8.7* 05/28/2012   HCT 27.4* 05/28/2012   PLT 195 05/28/2012   GLUCOSE 81 05/28/2012   CHOL 160 02/14/2008   TRIG 442* 02/14/2008   HDL 26.5* 02/14/2008   LDLDIRECT 64.8 02/14/2008   ALKPHOS 55 05/28/2012   ALT 19 05/28/2012   AST 19 05/28/2012   NA 139 05/28/2012   K 3.6 05/28/2012   CL 102 05/28/2012   CREATININE 0.6* 05/28/2012   BUN 8.0 05/28/2012   CO2 28 05/28/2012     ASSESSMENT AND PLAN:    1. Smoking: none at this time.  2. Recurrent left lateral tongue squamous cell carcinoma: He now has metastatic disease.  Left lung mass and left iliac bone lesion were both active on PET scan. -Chemo: Carboplatin/Taxol/Erbitux: Once a week; 3 weeks on; 1 week of. Will repeat scan after 3 months to assess response.  -  He has grade 1 fatigue; grade 3 skin rash. I recommend proceeding with chemotherapy with carboplatin Taxol today however holding off on the Erbitux for 2 weeks.  3. Anemia: This is from anemia of chronic disease. Hemoglobin is slowly improving. No active bleeding. No transfusion indicated.   4. Calorie/Protein malnutrition: stable weight. 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   5. Mouth pain from treatment: He is on Vicodin prn.   6.  Skin rash:  Due to Erbitux. He has been taking hydrocortisone and Cleocin T. solution which has alcohol. I advised him to switch Cleocin T. solution his solution to gel form to avoid further drying skin.  I advised him to rotate Cleocin T. solution with cortisone cream to relieve his itching. He also has doxycycline 100 mg by mouth twice a day.  6. Follow up: in 2 weeks to start the 2nd cycle of chemo.   The length of time of the face-to-face encounter was 25 minutes. More than 50% of time was spent counseling and coordination of care.

## 2012-05-28 NOTE — Telephone Encounter (Signed)
Made and printed for pt. °

## 2012-06-04 ENCOUNTER — Telehealth: Payer: Self-pay | Admitting: *Deleted

## 2012-06-04 MED ORDER — LORAZEPAM 0.5 MG PO TABS: 0.5000 mg | ORAL_TABLET | Freq: Four times a day (QID) | ORAL | Status: DC | PRN

## 2012-06-04 MED ORDER — HYDROCODONE-ACETAMINOPHEN 5-325 MG PO TABS: 1.0000 | ORAL_TABLET | Freq: Four times a day (QID) | ORAL | Status: DC | PRN

## 2012-06-04 NOTE — Telephone Encounter (Signed)
Pt called requesting refill on his Hydrocodone and Ativan.  Wants hydrocodone changed to pills instead of liquid.  Called in the hydrocodone pills and ativan refill to pt's pharmacy per Dr. Gaylyn Rong.  Notified pt of refills. He verbalized understanding.

## 2012-06-05 ENCOUNTER — Inpatient Hospital Stay (HOSPITAL_COMMUNITY): Admission: RE | Admit: 2012-06-05 | Payer: Self-pay | Source: Ambulatory Visit

## 2012-06-06 ENCOUNTER — Other Ambulatory Visit: Payer: Self-pay | Admitting: Lab

## 2012-06-06 ENCOUNTER — Ambulatory Visit: Payer: Self-pay | Admitting: Oncology

## 2012-06-07 ENCOUNTER — Other Ambulatory Visit: Payer: Self-pay | Admitting: Oncology

## 2012-06-10 NOTE — Patient Instructions (Addendum)
1.  Diagnosis:  Metastatic head/neck cancer. 2.  Treatment:  Weekly Carboplatin/Taxol/Erbitux; 3 weeks on, 1 week off. 3.  Impression:  Finished cycl#1 with mild-moderate skin rash due to Erbitux.  There is also slight decrease in neutrophil and platelet counts.  4.  Recommendation:  Resume Erbitux since rash has improved.  Chemo Carboplatin/Taxol dose reduction by 25% from baseline.  If despite dose reduction, and then there is still cytopenia, I may consider decreasing the frequency of therapy with weekly chemotherapy, 2 weeks on, 1 week off (as opposed to 3 weeks on, 1 week off).  5.  Reassessment:  Repeat CT after the 3rd cycle.

## 2012-06-11 ENCOUNTER — Ambulatory Visit: Payer: Self-pay

## 2012-06-11 ENCOUNTER — Telehealth: Payer: Self-pay | Admitting: Oncology

## 2012-06-11 ENCOUNTER — Ambulatory Visit: Payer: Medicaid Other | Admitting: Nutrition

## 2012-06-11 ENCOUNTER — Other Ambulatory Visit (HOSPITAL_BASED_OUTPATIENT_CLINIC_OR_DEPARTMENT_OTHER): Payer: Medicaid - Dental | Admitting: Lab

## 2012-06-11 ENCOUNTER — Telehealth: Payer: Self-pay | Admitting: *Deleted

## 2012-06-11 ENCOUNTER — Ambulatory Visit (HOSPITAL_BASED_OUTPATIENT_CLINIC_OR_DEPARTMENT_OTHER): Payer: Medicaid Other

## 2012-06-11 ENCOUNTER — Ambulatory Visit (HOSPITAL_BASED_OUTPATIENT_CLINIC_OR_DEPARTMENT_OTHER): Payer: Medicaid Other | Admitting: Oncology

## 2012-06-11 VITALS — BP 139/80 | HR 94 | Temp 97.6°F | Resp 18 | Ht 73.0 in | Wt 144.8 lb

## 2012-06-11 DIAGNOSIS — R21 Rash and other nonspecific skin eruption: Secondary | ICD-10-CM

## 2012-06-11 DIAGNOSIS — D638 Anemia in other chronic diseases classified elsewhere: Secondary | ICD-10-CM

## 2012-06-11 DIAGNOSIS — C01 Malignant neoplasm of base of tongue: Secondary | ICD-10-CM

## 2012-06-11 DIAGNOSIS — C029 Malignant neoplasm of tongue, unspecified: Secondary | ICD-10-CM

## 2012-06-11 DIAGNOSIS — C7800 Secondary malignant neoplasm of unspecified lung: Secondary | ICD-10-CM

## 2012-06-11 DIAGNOSIS — C7951 Secondary malignant neoplasm of bone: Secondary | ICD-10-CM

## 2012-06-11 DIAGNOSIS — Z5111 Encounter for antineoplastic chemotherapy: Secondary | ICD-10-CM

## 2012-06-11 DIAGNOSIS — Z5112 Encounter for antineoplastic immunotherapy: Secondary | ICD-10-CM

## 2012-06-11 DIAGNOSIS — C7952 Secondary malignant neoplasm of bone marrow: Secondary | ICD-10-CM

## 2012-06-11 DIAGNOSIS — C760 Malignant neoplasm of head, face and neck: Secondary | ICD-10-CM

## 2012-06-11 LAB — CBC WITH DIFFERENTIAL/PLATELET
BASO%: 0.9 % (ref 0.0–2.0)
EOS%: 2.8 % (ref 0.0–7.0)
HCT: 30.9 % — ABNORMAL LOW (ref 38.4–49.9)
LYMPH%: 23.3 % (ref 14.0–49.0)
MCH: 30.2 pg (ref 27.2–33.4)
MCHC: 31.4 g/dL — ABNORMAL LOW (ref 32.0–36.0)
MONO%: 10.7 % (ref 0.0–14.0)
NEUT%: 62.3 % (ref 39.0–75.0)
Platelets: 141 10*3/uL (ref 140–400)

## 2012-06-11 LAB — COMPREHENSIVE METABOLIC PANEL (CC13)
AST: 19 U/L (ref 5–34)
Alkaline Phosphatase: 59 U/L (ref 40–150)
BUN: 11 mg/dL (ref 7.0–26.0)
Calcium: 9.8 mg/dL (ref 8.4–10.4)
Chloride: 107 mEq/L (ref 98–107)
Creatinine: 0.6 mg/dL — ABNORMAL LOW (ref 0.7–1.3)

## 2012-06-11 MED ORDER — PACLITAXEL CHEMO INJECTION 300 MG/50ML
60.0000 mg/m2 | Freq: Once | INTRAVENOUS | Status: AC
  Administered 2012-06-11: 114 mg via INTRAVENOUS
  Filled 2012-06-11: qty 19

## 2012-06-11 MED ORDER — DIPHENHYDRAMINE HCL 50 MG/ML IJ SOLN
50.0000 mg | Freq: Once | INTRAMUSCULAR | Status: AC
  Administered 2012-06-11: 50 mg via INTRAVENOUS

## 2012-06-11 MED ORDER — DIPHENHYDRAMINE HCL 50 MG/ML IJ SOLN: 50.0000 mg | Freq: Once | INTRAMUSCULAR | Status: DC

## 2012-06-11 MED ORDER — DEXAMETHASONE SODIUM PHOSPHATE 4 MG/ML IJ SOLN
20.0000 mg | Freq: Once | INTRAMUSCULAR | Status: AC
  Administered 2012-06-11: 20 mg via INTRAVENOUS

## 2012-06-11 MED ORDER — SODIUM CHLORIDE 0.9 % IV SOLN
Freq: Once | INTRAVENOUS | Status: AC
  Administered 2012-06-11: 10:00:00 via INTRAVENOUS

## 2012-06-11 MED ORDER — SODIUM CHLORIDE 0.9 % IV SOLN: Freq: Once | INTRAVENOUS | Status: AC

## 2012-06-11 MED ORDER — SODIUM CHLORIDE 0.9 % IV SOLN
225.0000 mg | Freq: Once | INTRAVENOUS | Status: AC
  Administered 2012-06-11: 230 mg via INTRAVENOUS
  Filled 2012-06-11: qty 23

## 2012-06-11 MED ORDER — CETUXIMAB CHEMO IV INJECTION 200 MG/100ML
188.5000 mg/m2 | Freq: Once | INTRAVENOUS | Status: AC
  Administered 2012-06-11: 300 mg via INTRAVENOUS
  Filled 2012-06-11: qty 150

## 2012-06-11 MED ORDER — FAMOTIDINE IN NACL 20-0.9 MG/50ML-% IV SOLN
20.0000 mg | Freq: Once | INTRAVENOUS | Status: AC
  Administered 2012-06-11: 20 mg via INTRAVENOUS

## 2012-06-11 MED ORDER — ONDANSETRON 16 MG/50ML IVPB (CHCC)
16.0000 mg | Freq: Once | INTRAVENOUS | Status: AC
  Administered 2012-06-11: 16 mg via INTRAVENOUS

## 2012-06-11 NOTE — Progress Notes (Signed)
Ok to treat with ANC 1.3 per Dr. Lodema Pilot note on 06/11/2022.

## 2012-06-11 NOTE — Progress Notes (Signed)
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 He now developed biopsy proven metastatic disease in 03/2012 with met in right neck, left lung mass and left illac.   PAST THERAPY:  1. 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 He developed recurrence in the left submandibular gland in August 2012. He underwent on 07/27/2011 radical left neck dissection by Dr. Ezzard Standing and Dr. Geryl Rankins. Pathology case number [id number] showed total 15 lymph nodes from left radical neck dissection that was all negative. However the left submandibular gland mass showed a 3.5 cm invasive SCC, moderately differentiates involving the adjacent skeletal muscle tissue and bone a tissue and extending into the inked margin. There was no evidence of any lymphatic invasion or perineural invasion. He underwent further resection by Dr. Mary Sella at [facility] on 09/29/2011.  2. He received adjuvant chemoradiation therapy with q 3 week Cisplatin given 11/15/11-12/05/11 (received 2 of 3 doses; 3rd dose held due to febrile neutropenia and development of flap abscess). He received concurrent XRT 11/15/11 through 01/05/12.  3. For right neck recurrence in 03/2012; he underwent palliative right neck dissection by Dr. Manson Passey at [facility]  CURRENT THERAPY: started on 05/15/2012 palliative chemo weekly Carboplatin/Taxol/Erbitux; 3 weeks on; 1 week off   INTERVAL HISTORY: AMAHD MORINO 51 y.o. male returns for regular follow up with his father.  He has mild fatigue.  He is still 100% PEG tube dependent because of inability to open his jaw wide due to past treatments.  He has less pain in the mouth.  He takes Lortab elixir a few times a day.  His skin rash on the scalp, face,  chest has significantly improved having 2 weeks off of Erbitux and using Doxycycline and CleocinT gel.  He denied fever, cough, SOB, chest pain, abd pain, PEG tube infection/pain, diarrhea, bleeding symptoms.  Past Medical History  Diagnosis Date  . Hemorrhoids   . PONV (postoperative nausea and vomiting)   . Hypertension     no medications at present  . GERD (gastroesophageal reflux disease)     no medication  . Depression     treated approx 12 years ago, no treatment at this time  . Hiatal hernia   . Cancer 12/2010    left side of tongue T1 lesion excised 4/12  . Neutropenia 12/23/2011  . Abscess 12/27/2011  . Hx of radiation therapy 11/15/11 -01/05/12    left lateral tongue  . Lesion of left lung 03/14/12    CT chest, [facility], squamous cell carcinoma  . Bone metastasis 04/25/12    PET scan-left iliac bone  . Facial rash 05/24/2012    Past Surgical History  Procedure Date  . Inguinal herniorrhapy   . Tonsillectomy and adenoidectomy   . Excision of tongue mass     April 2012  . Hernia repair   . Neck mass excision 07/27/2011  . Radical neck dissection 07/27/2011    Procedure: RADICAL NECK DISSECTION;  Surgeon: Drema Halon, MD;  Location: [facility] OR;  Service: ENT;  Laterality: Left;  Marland Kitchen Mandibulectomy 09/2011    and scapular free flap  . Modified radical neck dissection 03/27/12    excision of lip lesion, lip benign, right neck squamous cell ca    Current Outpatient Prescriptions  Medication Sig Dispense Refill  . clindamycin (CLEOCIN-T) 1 % gel Apply topically 2 (two) times daily. Apply to facial and chest skin rash.  30 g  3  . clobetasol cream (TEMOVATE) 0.05 % Apply topically 2 (two) times daily. Apply to rash BID  60 g  2  . doxycycline (VIBRAMYCIN) 100 MG capsule Take 100 mg by mouth 2 (two) times daily.      Marland Kitchen gabapentin (NEURONTIN) 300 MG capsule Take 300 mg by mouth at bedtime.      Marland Kitchen HYDROcodone-acetaminophen (NORCO) 5-325 MG per tablet Take 1 tablet by mouth every 6  (six) hours as needed for pain.  90 tablet  0  . hydrocortisone cream 0.5 % Apply topically 2 (two) times daily as needed. Apply to facial rash twice daily as needed.  30 g  0  . ibuprofen (ADVIL,MOTRIN) 200 MG tablet Take 400 mg by mouth every 6 (six) hours as needed. Pain       . LORazepam (ATIVAN) 0.5 MG tablet Take 1 tablet (0.5 mg total) by mouth every 6 (six) hours as needed (Nausea).  30 tablet  2  . omeprazole (PRILOSEC) 10 MG capsule Take 10 mg by mouth daily.      . ondansetron (ZOFRAN) 8 MG tablet Take 8 mg by mouth every 12 (twelve) hours as needed.      . prochlorperazine (COMPAZINE) 10 MG tablet Take 10 mg by mouth every 6 (six) hours as needed.        ALLERGIES:  is allergic to codeine phosphate and tape.  REVIEW OF SYSTEMS:  The rest of the 14-point review of system was negative.   Filed Vitals:   06/11/12 0829  BP: 139/80  Pulse: 94  Temp: 97.6 F (36.4 C)  Resp: 18   Wt Readings from Last 3 Encounters:  06/11/12 144 lb 12.8 oz (65.681 kg)  05/28/12 145 lb 8 oz (65.998 kg)  05/21/12 145 lb 12.8 oz (66.134 kg)   ECOG Performance status: 1  PHYSICAL EXAMINATION:   General: thin-appearing man, in no acute distress. Eyes: no scleral icterus. ENT: There were no oropharyngeal lesions on my unaided exam. Neck was without thyromegaly. There was a flap in the left jaw which has healed. Right neck was fibrotic from recent resection. There was no erythema, purulent discharge, pain on palpation. Lymphatics: no clear palpable adenopathy.  Respiratory: lungs were clear bilaterally without wheezing or crackles. Cardiovascular: Regular rate and rhythm, S1/S2, without murmur, rub or gallop. There was no pedal edema. GI: abdomen was soft, flat, nontender, nondistended, without organomegaly. PEG tube was dry, clean, intact. Muscoloskeletal: no spinal tenderness of palpation of vertebral spine. Skin exam was without echymosis, petichae. There was less numerous aciniform skin rash on the  face, trunk, and scalp. The existing lesions were more healed than 2 weeks ago.  Neuro exam was nonfocal. Patient was able to get on and off exam table without assistance. Gait was normal. Patient was alerted and oriented. Attention was good. Language was appropriate. Mood was normal without depression. Speech was not pressured. Thought content was not tangential.     LABORATORY/RADIOLOGY DATA:  Lab Results  Component Value Date   WBC 2.2* 06/11/2012   HGB 9.7* 06/11/2012   HCT 30.9* 06/11/2012   PLT 141 06/11/2012   GLUCOSE 81 05/28/2012   CHOL 160 02/14/2008   TRIG 442* 02/14/2008   HDL 26.5* 02/14/2008   LDLDIRECT 64.8 02/14/2008   ALKPHOS 55 05/28/2012   ALT 19 05/28/2012  AST 19 05/28/2012   NA 139 05/28/2012   K 3.6 05/28/2012   CL 102 05/28/2012   CREATININE 0.6* 05/28/2012   BUN 8.0 05/28/2012   CO2 28 05/28/2012     ASSESSMENT AND PLAN:   1. Smoking: none at this time.  2. Recurrent left lateral tongue squamous cell carcinoma: He now has metastatic disease. Left lung mass and left iliac bone lesion were both active on PET scan.  -Chemo: Carboplatin/Taxol/Erbitux: Once a week; 3 weeks on; 1 week of. Will repeat scan after 3 months to assess response.   - He now has grade 1 fatigue; grade 1-2 skin rash, grade 2 neutropenia and anemia. I recommend proceeding with chemotherapy with carboplatin Taxol and Erbitux with 25% dose reduction.  If this dose reduction is still not sufficient, I may consider changing chemo to 2 weeks one, 1 week off every 3 week cycle as opposed to every 4 week-cycle.  3. Anemia: This is from anemia of chronic disease. Hemoglobin is slowly improving. No active bleeding. No transfusion indicated.  4. Calorie/Protein malnutrition: stable weight. 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  He will see Vernell Leep today to see if he can increase feeding solution.  5. Mouth pain from treatment: He is on Vicodin prn.  6. Skin rash: Due to Erbitux. This is now grade  1-2.  I reduced Erbitux dose today.  7.  Bone met:  I sent a message to Dr. Kristin Bruins to see patient can be cleared from dental standpoint to receive Zometa in the near future.  6. Follow up: once per cycle.   The length of time of the face-to-face encounter was 25 minutes. More than 50% of time was spent counseling and coordination of care.

## 2012-06-11 NOTE — Telephone Encounter (Signed)
gv and printed pt appt schedule...Thomas Bean-mailed Marcelino Duster to add chemos..the patient is aware he needs to pick up updated schedule at nxt appt.Thomas KitchenMarland KitchenMarland Kitchen

## 2012-06-11 NOTE — Patient Instructions (Signed)
[facility] Discharge Instructions for Patients Receiving Chemotherapy  Today you received the following chemotherapy agents Erbitux/Taxol/Carboplatin To help prevent nausea and vomiting after your treatment, we encourage you to take your nausea medication as prescribed.   If you develop nausea and vomiting that is not controlled by your nausea medication, call the clinic. If it is after clinic hours your family physician or the after hours number for the clinic or go to the Emergency Department.   BELOW ARE SYMPTOMS THAT SHOULD BE REPORTED IMMEDIATELY:  *FEVER GREATER THAN 100.5 F  *CHILLS WITH OR WITHOUT FEVER  NAUSEA AND VOMITING THAT IS NOT CONTROLLED WITH YOUR NAUSEA MEDICATION  *UNUSUAL SHORTNESS OF BREATH  *UNUSUAL BRUISING OR BLEEDING  TENDERNESS IN MOUTH AND THROAT WITH OR WITHOUT PRESENCE OF ULCERS  *URINARY PROBLEMS  *BOWEL PROBLEMS  UNUSUAL RASH Items with * indicate a potential emergency and should be followed up as soon as possible.  One of the nurses will contact you 24 hours after your treatment. Please let the nurse know about any problems that you may have experienced. Feel free to call the clinic you have any questions or concerns. The clinic phone number is [phone].   I have been informed and understand all the instructions given to me. I know to contact the clinic, my physician, or go to the Emergency Department if any problems should occur. I do not have any questions at this time, but understand that I may call the clinic during office hours   should I have any questions or need assistance in obtaining follow up care.    __________________________________________  _____________  __________ Signature of Patient or Authorized Representative            Date                   Time    __________________________________________ Nurse's Signature    

## 2012-06-11 NOTE — Progress Notes (Signed)
He reports he is doing well.  He continues Osmolite 1.5 six cans daily via his PEG.  He is using protein powder with bolus feedings throughout the day.  He reports improved tolerance.  He states he is drinking a lot of water by mouth but he is not eating food by mouth. Current weight has declined slightly to 144.8 pounds from 145.5 pounds, September 16th.  NUTRITION DIAGNOSIS:  Inadequate oral intake continues.  INTERVENTION:  The patient was educated to continue 6 cans of Osmolite 1.5 with 1 to 1-1/2 scoops of protein powder daily along with free water flushes and water by mouth as tolerated to provide 100% of patient's estimated needs.  This is approximately 2330 calories, 149 g of protein, and 2586 mL free water.  MONITORING/EVALUATION/GOALS:  The patient has tolerated tube feeding at goal rate.  His weight has declined slightly.  He will work to maintain present weight.  NEXT VISIT:  Monday, October 7th, during chemotherapy.    ______________________________ Zenovia Jarred, RD, CSO, LDN Clinical Nutrition Specialist BN/MEDQ  D:  06/11/2012  T:  06/11/2012  Job:  [phone] [id number]

## 2012-06-11 NOTE — Telephone Encounter (Signed)
Per staff message and POF I have scheduled appts.  JMW  

## 2012-06-12 ENCOUNTER — Other Ambulatory Visit: Payer: Self-pay | Admitting: Certified Registered Nurse Anesthetist

## 2012-06-18 ENCOUNTER — Encounter: Payer: Self-pay | Admitting: Oncology

## 2012-06-18 ENCOUNTER — Ambulatory Visit: Payer: Self-pay

## 2012-06-18 ENCOUNTER — Ambulatory Visit: Payer: Medicaid Other | Admitting: Nutrition

## 2012-06-18 ENCOUNTER — Ambulatory Visit (HOSPITAL_BASED_OUTPATIENT_CLINIC_OR_DEPARTMENT_OTHER): Payer: Medicaid Other

## 2012-06-18 ENCOUNTER — Other Ambulatory Visit (HOSPITAL_BASED_OUTPATIENT_CLINIC_OR_DEPARTMENT_OTHER): Payer: Medicaid Other

## 2012-06-18 VITALS — BP 137/81 | HR 91 | Temp 98.3°F | Resp 18

## 2012-06-18 DIAGNOSIS — C029 Malignant neoplasm of tongue, unspecified: Secondary | ICD-10-CM

## 2012-06-18 DIAGNOSIS — C01 Malignant neoplasm of base of tongue: Secondary | ICD-10-CM

## 2012-06-18 DIAGNOSIS — Z5112 Encounter for antineoplastic immunotherapy: Secondary | ICD-10-CM

## 2012-06-18 DIAGNOSIS — C7800 Secondary malignant neoplasm of unspecified lung: Secondary | ICD-10-CM

## 2012-06-18 DIAGNOSIS — C7951 Secondary malignant neoplasm of bone: Secondary | ICD-10-CM

## 2012-06-18 DIAGNOSIS — Z5111 Encounter for antineoplastic chemotherapy: Secondary | ICD-10-CM

## 2012-06-18 DIAGNOSIS — C770 Secondary and unspecified malignant neoplasm of lymph nodes of head, face and neck: Secondary | ICD-10-CM

## 2012-06-18 DIAGNOSIS — C760 Malignant neoplasm of head, face and neck: Secondary | ICD-10-CM

## 2012-06-18 LAB — CBC WITH DIFFERENTIAL/PLATELET
BASO%: 1.1 % (ref 0.0–2.0)
Eosinophils Absolute: 0.1 10*3/uL (ref 0.0–0.5)
LYMPH%: 18.5 % (ref 14.0–49.0)
MCHC: 32.7 g/dL (ref 32.0–36.0)
MCV: 95.2 fL (ref 79.3–98.0)
MONO#: 0.1 10*3/uL (ref 0.1–0.9)
MONO%: 5.2 % (ref 0.0–14.0)
NEUT#: 2 10*3/uL (ref 1.5–6.5)
RBC: 3.15 10*6/uL — ABNORMAL LOW (ref 4.20–5.82)
RDW: 16.8 % — ABNORMAL HIGH (ref 11.0–14.6)
WBC: 2.7 10*3/uL — ABNORMAL LOW (ref 4.0–10.3)
nRBC: 0 % (ref 0–0)

## 2012-06-18 LAB — COMPREHENSIVE METABOLIC PANEL (CC13)
ALT: 24 U/L (ref 0–55)
AST: 25 U/L (ref 5–34)
CO2: 27 mEq/L (ref 22–29)
Creatinine: 0.6 mg/dL — ABNORMAL LOW (ref 0.7–1.3)
Sodium: 140 mEq/L (ref 136–145)
Total Bilirubin: 0.5 mg/dL (ref 0.20–1.20)
Total Protein: 6.8 g/dL (ref 6.4–8.3)

## 2012-06-18 MED ORDER — DEXTROSE 5 % IV SOLN
60.0000 mg/m2 | Freq: Once | INTRAVENOUS | Status: AC
  Administered 2012-06-18: 114 mg via INTRAVENOUS
  Filled 2012-06-18: qty 19

## 2012-06-18 MED ORDER — DEXAMETHASONE SODIUM PHOSPHATE 4 MG/ML IJ SOLN
20.0000 mg | Freq: Once | INTRAMUSCULAR | Status: AC
  Administered 2012-06-18: 20 mg via INTRAVENOUS

## 2012-06-18 MED ORDER — SODIUM CHLORIDE 0.9 % IV SOLN
225.0000 mg | Freq: Once | INTRAVENOUS | Status: AC
  Administered 2012-06-18: 230 mg via INTRAVENOUS
  Filled 2012-06-18: qty 23

## 2012-06-18 MED ORDER — ONDANSETRON 16 MG/50ML IVPB (CHCC)
16.0000 mg | Freq: Once | INTRAVENOUS | Status: AC
  Administered 2012-06-18: 16 mg via INTRAVENOUS

## 2012-06-18 MED ORDER — SODIUM CHLORIDE 0.9 % IV SOLN
Freq: Once | INTRAVENOUS | Status: AC
  Administered 2012-06-18: 10:00:00 via INTRAVENOUS

## 2012-06-18 MED ORDER — FAMOTIDINE IN NACL 20-0.9 MG/50ML-% IV SOLN
20.0000 mg | Freq: Once | INTRAVENOUS | Status: AC
  Administered 2012-06-18: 20 mg via INTRAVENOUS

## 2012-06-18 MED ORDER — DIPHENHYDRAMINE HCL 50 MG/ML IJ SOLN
50.0000 mg | Freq: Once | INTRAMUSCULAR | Status: AC
  Administered 2012-06-18: 50 mg via INTRAVENOUS

## 2012-06-18 MED ORDER — CETUXIMAB CHEMO IV INJECTION 200 MG/100ML
187.5000 mg/m2 | Freq: Once | INTRAVENOUS | Status: AC
  Administered 2012-06-18: 300 mg via INTRAVENOUS
  Filled 2012-06-18: qty 150

## 2012-06-18 NOTE — Progress Notes (Signed)
I spoke with Thomas Bean in the chemotherapy area. Patient continues Osmolite 1.5 six times daily via his PEG. He continues to use protein powder in his feeding tube. He is also drinking water by mouth as well as flushing through his PEG. Last weight was documented 144.8 on September 30. There is no new weight since then. Patient has no nutrition concerns.  Nutrition diagnosis: Inadequate oral intake continues.  Intervention: The patient was educated to continue 6 cans of Osmolite 1.5 with 1 to 1-1/2 scoops of protein powder daily along with free water flushes and water by mouth as tolerated. This provides approximately 2330 calories, 149 g protein, and 2586 mL free water. This should be 100% of patient's estimated needs.  Monitoring, evaluation, goals: The patient continues to tolerate tube feedings with free water flushes and water by mouth. Patient will continue current nutrition support to provide weight maintenance.  Next visit: Monday, October 14.

## 2012-06-18 NOTE — Patient Instructions (Addendum)
[facility] Discharge Instructions for Patients Receiving Chemotherapy  Today you received the following chemotherapy agents Erbitux, Taxol and Carboplatin  To help prevent nausea and vomiting after your treatment, we encourage you to take your nausea medication as prescribed.   If you develop nausea and vomiting that is not controlled by your nausea medication, call the clinic. If it is after clinic hours your family physician or the after hours number for the clinic or go to the Emergency Department.   BELOW ARE SYMPTOMS THAT SHOULD BE REPORTED IMMEDIATELY:  *FEVER GREATER THAN 100.5 F  *CHILLS WITH OR WITHOUT FEVER  NAUSEA AND VOMITING THAT IS NOT CONTROLLED WITH YOUR NAUSEA MEDICATION  *UNUSUAL SHORTNESS OF BREATH  *UNUSUAL BRUISING OR BLEEDING  TENDERNESS IN MOUTH AND THROAT WITH OR WITHOUT PRESENCE OF ULCERS  *URINARY PROBLEMS  *BOWEL PROBLEMS  UNUSUAL RASH Items with * indicate a potential emergency and should be followed up as soon as possible.  One of the nurses will contact you 24 hours after your treatment. Please let the nurse know about any problems that you may have experienced. Feel free to call the clinic you have any questions or concerns. The clinic phone number is [phone].   I have been informed and understand all the instructions given to me. I know to contact the clinic, my physician, or go to the Emergency Department if any problems should occur. I do not have any questions at this time, but understand that I may call the clinic during office hours   should I have any questions or need assistance in obtaining follow up care.    __________________________________________  _____________  __________ Signature of Patient or Authorized Representative            Date                   Time    __________________________________________ Nurse's Signature    

## 2012-06-18 NOTE — Progress Notes (Signed)
Ok to treat per Dr. Gaylyn Rong.

## 2012-06-19 ENCOUNTER — Ambulatory Visit (HOSPITAL_COMMUNITY): Payer: Medicaid Other | Admitting: Dentistry

## 2012-06-19 ENCOUNTER — Encounter: Payer: Self-pay | Admitting: Oncology

## 2012-06-19 ENCOUNTER — Encounter (HOSPITAL_COMMUNITY): Payer: Self-pay | Admitting: Dentistry

## 2012-06-19 VITALS — BP 111/70 | HR 83 | Temp 99.2°F

## 2012-06-19 DIAGNOSIS — K08199 Complete loss of teeth due to other specified cause, unspecified class: Secondary | ICD-10-CM

## 2012-06-19 DIAGNOSIS — M264 Malocclusion, unspecified: Secondary | ICD-10-CM

## 2012-06-19 DIAGNOSIS — K117 Disturbances of salivary secretion: Secondary | ICD-10-CM

## 2012-06-19 DIAGNOSIS — K0530 Chronic periodontitis, unspecified: Secondary | ICD-10-CM

## 2012-06-19 DIAGNOSIS — K08109 Complete loss of teeth, unspecified cause, unspecified class: Secondary | ICD-10-CM

## 2012-06-19 DIAGNOSIS — K036 Deposits [accretions] on teeth: Secondary | ICD-10-CM

## 2012-06-19 DIAGNOSIS — K08409 Partial loss of teeth, unspecified cause, unspecified class: Secondary | ICD-10-CM

## 2012-06-19 MED ORDER — SODIUM FLUORIDE 1.1 % DT CREA: TOPICAL_CREAM | DENTAL | Status: AC

## 2012-06-19 NOTE — Progress Notes (Signed)
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 The patient was then noted to have lung and bone metastases also. Patient has been receiving chemotherapy with Dr. Gaylyn Bean. Patient with anticipated use of Zometa therapy. Dental consultation requested for a pre-Zometa dental evaluation.  Patient Active Problem List  Diagnosis  . DEPRESSION  . HYPERTENSION  . HEMORRHOIDS  . INGUINAL HERNIA  . DIVERTICULOSIS, COLON  . IRRITABLE BOWEL SYNDROME  . BACK PAIN  . Tongue lesion  . Squamous cell carcinoma of tongue  . Anxiety  . Cancer of head, face, or neck lymph nodes, secondary  . Neutropenia  . Abscess  . Neutropenic fever  . Mucositis  . Cancer  . Hx of radiation therapy  . Lesion of left lung  . Bone metastasis  . Facial rash   Medical Hx Update:  Past Medical History  Diagnosis Date  . Hemorrhoids   . PONV (postoperative nausea and vomiting)   . Hypertension     no medications at present  . GERD (gastroesophageal reflux disease)     no medication  . Depression     treated approx 12 years ago, no treatment at this time  . Hiatal hernia   . Cancer 12/2010    left side of tongue T1 lesion excised 4/12  . Neutropenia 12/23/2011  . Abscess 12/27/2011  . Hx of radiation therapy 11/15/11 -01/05/12    left lateral tongue  . Lesion of left lung 03/14/12    CT chest, [facility], squamous cell carcinoma  . Bone metastasis 04/25/12    PET scan-left iliac bone  . Facial rash 05/24/2012  . ALLERGIES/ADVERSE DRUG REACTIONS: Allergies  Allergen Reactions  . Codeine Phosphate     REACTION: rash, swelling  . Tape     Blisters; please use paper tape    MEDICATIONS: Current Outpatient Prescriptions  Medication Sig Dispense Refill  . clindamycin (CLEOCIN-T) 1 % gel Apply topically 2 (two) times daily. Apply to facial and chest skin rash.  30 g  3  . clobetasol cream (TEMOVATE) 0.05 % Apply topically 2 (two) times daily. Apply to rash BID  60 g  2  . doxycycline (VIBRAMYCIN) 100 MG capsule Take 100 mg by mouth 2 (two) times daily.      Marland Kitchen gabapentin (NEURONTIN) 300 MG capsule Take 300 mg by mouth at bedtime.      Marland Kitchen HYDROcodone-acetaminophen (NORCO) 5-325 MG per tablet Take 1 tablet by mouth every 6 (six) hours as needed for pain.  90 tablet  0  . hydrocortisone cream 0.5 % Apply topically 2 (two) times daily as needed. Apply to facial rash twice daily as needed.  30 g  0  . LORazepam (ATIVAN) 0.5 MG tablet Take 1 tablet (0.5 mg total) by mouth every 6 (six) hours as needed (Nausea).  30 tablet  2  . omeprazole (PRILOSEC) 10 MG capsule Take 10 mg by mouth daily.      . ondansetron (ZOFRAN) 8 MG tablet Take 8 mg by mouth every 12 (twelve) hours as needed.      Marland Kitchen  prochlorperazine (COMPAZINE) 10 MG tablet Take 10 mg by mouth every 6 (six) hours as needed.      Marland Kitchen ibuprofen (ADVIL,MOTRIN) 200 MG tablet Take 400 mg by mouth every 6 (six) hours as needed. Pain       . sodium fluoride (PREVIDENT 5000 PLUS) 1.1 % CREA dental cream Apply thin ribbon of cream to tooth brush. Brush teeth for 2 minutes. Spit out excess-DO NOT swallow. DO NOT rinse afterwards. Repeat nightly.  1 Tube  prn   No current facility-administered medications for this visit.   Facility-Administered Medications Ordered in Other Visits  Medication Dose Route Frequency Provider Last Rate Last Dose  . 0.9 %  sodium chloride infusion   Intravenous Once Thomas Parody, MD      . CARBOplatin (PARAPLATIN) 230 mg in sodium chloride 0.9 % 100 mL chemo infusion  230 mg Intravenous Once Thomas Parody, MD   230 mg at 06/18/12 1256  . cetuximab (ERBITUX) chemo infusion 300 mg  187.5 mg/m2 (Treatment  Plan Actual) Intravenous Once Thomas Parody, MD   300 mg at 06/18/12 1045  . PACLitaxel (TAXOL) 114 mg in dextrose 5 % 250 mL chemo infusion (</= 80mg /m2)  60 mg/m2 (Treatment Plan Actual) Intravenous Once Thomas Parody, MD   114 mg at 06/18/12 1148    Lab Results  Component Value Date   WBC 2.7* 06/18/2012   HGB 9.8* 06/18/2012   HCT 30.0* 06/18/2012   MCV 95.2 06/18/2012   PLT 114* 06/18/2012   BMET    Component Value Date/Time   NA 140 06/18/2012 0907   NA 140 05/03/2012 0905   K 3.9 06/18/2012 0907   K 4.0 05/03/2012 0905   CL 104 06/18/2012 0907   CL 102 05/03/2012 0905   CO2 27 06/18/2012 0907   CO2 32 05/03/2012 0905   GLUCOSE 104* 06/18/2012 0907   GLUCOSE 80 05/03/2012 0905   BUN 11.0 06/18/2012 0907   BUN 13 05/03/2012 0905   CREATININE 0.6* 06/18/2012 0907   CREATININE 0.64 05/03/2012 0905   CALCIUM 9.6 06/18/2012 0907   CALCIUM 9.4 05/03/2012 0905   GFRNONAA >90 12/29/2011 0350   GFRAA >90 12/29/2011 0350    DENTAL EXAM: General: Patient is a well-developed, slightly built male in no acute distress. Patient has lost significant weight since last visit. Vitals: As above. Extraoral Exam: Patient with bilateral neck dissections.  Patient denies acute TMJ Symptoms. Patient is still doing his trismus exercises. Intraoral  Exam:  Patient has severe xerostomia. The left mandible is consistent with previous surgical resection and reconstruction efforts.  Patient recently had a lip procedure at [facility] while undergoing right neck dissection. Dentition:  Dentition as before. Caries:  No caries noted. Endodontic:  There are no periapical radiolucencies. The patient denies acute toothache symptoms C&B:  crown on #30 with no acute changes Prosthodontic:No partials Occlusion:  Patient has a malocclusion secondary to surgical resection changes occlusion.  This is stable however.  Radiographic Interpretation: Orthopantogram was obtained. No significant changes noted from last orthopantogram  taken on 01/25/2012.  Assessments: 1. Severe xerostomia 2. Minimal plaque accumulations 3. Multiple missing teeth secondary to surgical resection 4. Malocclusion secondary to surgical resection and reconstruction. 5 . No acute dental pain 6. No tooth mobility.  Plan:  1. Patient is cleared for Zometa therapy. 2. Patient to continue brushing after meals and at bedtime. Patient views fluoride therapy with the fluoride trays or use the PreviDent 5000 on toothbrush at bedtime.  Prescription was faxed to [facility] pharmacy for Prevident 5000 Plus. PRN refills. 3. Patient to consider use of chlorhexidine rinses to use as a disinfectant for the mouth and to decrease plaque amounts. 4. Patient to call if he wishes to be referred to a primary dentist for exam and cleaning otherwise he will call if other problems arise.    Charlynne Pander, DDS

## 2012-06-19 NOTE — Patient Instructions (Addendum)
1. Patient to continue brushing after meals and at bedtime. Patient to continue fluoride therapy with the fluoride trays or use the PreviDent 5000 on toothbrush at bedtime.  Prescription was faxed to [facility] pharmacy for Prevident 5000 Plus. PRN refills. 2. Patient to consider use of chlorhexidine rinses to use as a disinfectant for the mouth and to decrease plaque amounts. 3. Patient to call if he wishes to be referred to a primary dentist for exam and cleaning otherwise he will call if other problems arise.  Dr. Kristin Bruins

## 2012-06-20 ENCOUNTER — Telehealth: Payer: Self-pay | Admitting: *Deleted

## 2012-06-20 MED ORDER — NYSTATIN 100000 UNIT/GM EX POWD: Freq: Four times a day (QID) | CUTANEOUS | Status: DC

## 2012-06-20 MED ORDER — GABAPENTIN 300 MG PO CAPS: 300.0000 mg | ORAL_CAPSULE | Freq: Three times a day (TID) | ORAL | Status: DC

## 2012-06-20 NOTE — Telephone Encounter (Signed)
Pt emailed and called regarding a red, burning, stinging sun burn type rash under both arms.  Started about a week ago and progressively getting worse.  Does not look the same as the "Erbitux" rash pt has on face and chest.  He has been on doxycycline x 1 month and asks if this could be a yeast or fungal rash due to antibiotic use??  He had also emailed a request for refill on his Gabapentin.   Verbal order from Dr. Gaylyn Rong for refill on Gabapentin and for pt to try Nystatin Powder underarms and call back if it does not get better.   Called pt back and informed of refill for Gabapentin and new order to try Nystatin Powder for underarm rash.  Instructed to call back if rash does not improve on Nystatin.  He verbalized understanding.

## 2012-06-22 ENCOUNTER — Telehealth: Payer: Self-pay | Admitting: *Deleted

## 2012-06-22 ENCOUNTER — Other Ambulatory Visit: Payer: Self-pay | Admitting: Oncology

## 2012-06-22 ENCOUNTER — Other Ambulatory Visit: Payer: Self-pay | Admitting: *Deleted

## 2012-06-22 NOTE — Telephone Encounter (Signed)
Called pt regarding refill request for hydrocodone as it is a few days early.  Pt states still has enough pills left for a few more days was just requesting early d/t having to get some other refills at same time.  Pt will be here for chemo on Monday and I instructed him to ask for rx on Monday when he is here.  He verbalized understanding.   Asked how his rash under his arms is doing since starting on nystatin powder.  He says it is a little better, not any worse.  Asked him to notify infusion room nurse on Monday if it is any worse or just not any better.  She can notify Dr. Gaylyn Rong.  He verbalized understanding.

## 2012-06-22 NOTE — Telephone Encounter (Signed)
I thought pt has already got this a few days ago?

## 2012-06-25 ENCOUNTER — Other Ambulatory Visit (HOSPITAL_BASED_OUTPATIENT_CLINIC_OR_DEPARTMENT_OTHER): Payer: Medicaid Other

## 2012-06-25 ENCOUNTER — Ambulatory Visit: Payer: Medicaid Other | Admitting: Nutrition

## 2012-06-25 ENCOUNTER — Other Ambulatory Visit: Payer: Self-pay | Admitting: Oncology

## 2012-06-25 ENCOUNTER — Ambulatory Visit: Payer: Self-pay | Admitting: Lab

## 2012-06-25 ENCOUNTER — Ambulatory Visit (HOSPITAL_BASED_OUTPATIENT_CLINIC_OR_DEPARTMENT_OTHER): Payer: Medicaid Other

## 2012-06-25 VITALS — BP 138/85 | HR 90 | Temp 97.7°F

## 2012-06-25 DIAGNOSIS — C7951 Secondary malignant neoplasm of bone: Secondary | ICD-10-CM

## 2012-06-25 DIAGNOSIS — Z5111 Encounter for antineoplastic chemotherapy: Secondary | ICD-10-CM

## 2012-06-25 DIAGNOSIS — C760 Malignant neoplasm of head, face and neck: Secondary | ICD-10-CM

## 2012-06-25 DIAGNOSIS — C7952 Secondary malignant neoplasm of bone marrow: Secondary | ICD-10-CM

## 2012-06-25 DIAGNOSIS — C029 Malignant neoplasm of tongue, unspecified: Secondary | ICD-10-CM

## 2012-06-25 DIAGNOSIS — K1230 Oral mucositis (ulcerative), unspecified: Secondary | ICD-10-CM

## 2012-06-25 DIAGNOSIS — C01 Malignant neoplasm of base of tongue: Secondary | ICD-10-CM

## 2012-06-25 DIAGNOSIS — C787 Secondary malignant neoplasm of liver and intrahepatic bile duct: Secondary | ICD-10-CM

## 2012-06-25 DIAGNOSIS — C7800 Secondary malignant neoplasm of unspecified lung: Secondary | ICD-10-CM

## 2012-06-25 LAB — CBC WITH DIFFERENTIAL/PLATELET
Basophils Absolute: 0 10*3/uL (ref 0.0–0.1)
Eosinophils Absolute: 0.1 10*3/uL (ref 0.0–0.5)
HCT: 29.1 % — ABNORMAL LOW (ref 38.4–49.9)
HGB: 9.2 g/dL — ABNORMAL LOW (ref 13.0–17.1)
MCH: 30.8 pg (ref 27.2–33.4)
MCV: 97.3 fL (ref 79.3–98.0)
NEUT#: 1.6 10*3/uL (ref 1.5–6.5)
NEUT%: 71.4 % (ref 39.0–75.0)
RDW: 17.1 % — ABNORMAL HIGH (ref 11.0–14.6)
lymph#: 0.4 10*3/uL — ABNORMAL LOW (ref 0.9–3.3)

## 2012-06-25 LAB — BASIC METABOLIC PANEL
BUN: 10 mg/dL (ref 6–23)
CO2: 30 mEq/L (ref 19–32)
Chloride: 99 mEq/L (ref 96–112)
Creatinine, Ser: 0.47 mg/dL — ABNORMAL LOW (ref 0.50–1.35)
Glucose, Bld: 88 mg/dL (ref 70–99)
Potassium: 3.1 mEq/L — ABNORMAL LOW (ref 3.5–5.3)

## 2012-06-25 MED ORDER — CETUXIMAB CHEMO IV INJECTION 200 MG/100ML
187.5000 mg/m2 | Freq: Once | INTRAVENOUS | Status: DC
  Filled 2012-06-25: qty 150

## 2012-06-25 MED ORDER — PACLITAXEL CHEMO INJECTION 300 MG/50ML
60.0000 mg/m2 | Freq: Once | INTRAVENOUS | Status: AC
  Administered 2012-06-25: 114 mg via INTRAVENOUS
  Filled 2012-06-25: qty 19

## 2012-06-25 MED ORDER — SODIUM CHLORIDE 0.9 % IV SOLN
Freq: Once | INTRAVENOUS | Status: AC
  Administered 2012-06-25: 10:00:00 via INTRAVENOUS

## 2012-06-25 MED ORDER — DEXAMETHASONE SODIUM PHOSPHATE 4 MG/ML IJ SOLN
20.0000 mg | Freq: Once | INTRAMUSCULAR | Status: AC
  Administered 2012-06-25: 20 mg via INTRAVENOUS

## 2012-06-25 MED ORDER — SODIUM CHLORIDE 0.9 % IV SOLN
225.0000 mg | Freq: Once | INTRAVENOUS | Status: AC
  Administered 2012-06-25: 230 mg via INTRAVENOUS
  Filled 2012-06-25: qty 23

## 2012-06-25 MED ORDER — FAMOTIDINE IN NACL 20-0.9 MG/50ML-% IV SOLN
20.0000 mg | Freq: Once | INTRAVENOUS | Status: AC
  Administered 2012-06-25: 20 mg via INTRAVENOUS

## 2012-06-25 MED ORDER — DIPHENHYDRAMINE HCL 50 MG/ML IJ SOLN
50.0000 mg | Freq: Once | INTRAMUSCULAR | Status: AC
  Administered 2012-06-25: 50 mg via INTRAVENOUS

## 2012-06-25 MED ORDER — HYDROCODONE-ACETAMINOPHEN 5-325 MG PO TABS: 1.0000 | ORAL_TABLET | Freq: Four times a day (QID) | ORAL | Status: DC | PRN

## 2012-06-25 MED ORDER — ONDANSETRON 16 MG/50ML IVPB (CHCC)
16.0000 mg | Freq: Once | INTRAVENOUS | Status: AC
  Administered 2012-06-25: 16 mg via INTRAVENOUS

## 2012-06-25 NOTE — Progress Notes (Signed)
0920-Okay to treat today with WBC 2.2-dhp, rn 0930- Pt c/o red, painful areas under his arms.  Has red areas under both arms.  Blistered areas-dhp, rn 1005-Dr. Ha at chairside to assess pt.  Per Dr. Gaylyn Rong, will hold Erbitux today-dhp, rn

## 2012-06-25 NOTE — Progress Notes (Signed)
Patient reports he continues to do well. He has not been weighed here recently. He states he weighs at home and his weight is stable at 145-147 pounds. He denies problems with constipation. He is tolerating Osmolite 1.5 6 times daily along with one and one-half scoops of protein powder via his PEG. He continues to drink water by mouth as well as flushing through hisPEG. He has no new nutrition concerns.  Nutrition diagnosis: Inadequate oral intake continues.  Intervention: The patient was educated to continue 6 cans of Osmolite 1.5 with one and one half scoops of protein powder daily along with free water flushes and water by mouth as tolerated. This provides approximately 2330 calories, 149 g protein, 2586 mL of free water. This meets 100% of patient's estimated needs.  Monitoring, evaluation, goals: The patient continues to tolerate tube feedings with free water flushes and water by mouth. He has been able to promote weight maintenance.  Next visit: Monday, October 28, during chemotherapy.

## 2012-06-25 NOTE — Patient Instructions (Signed)
[facility] Discharge Instructions for Patients Receiving Chemotherapy  Today you received the following chemotherapy agents Taxol and Carboplatin  To help prevent nausea and vomiting after your treatment, we encourage you to take your nausea medication as prescribed.  Use Cleocin T (clindomyacin gel) under arms twice daily, can also use over the counter hydrocortisone cream (lowest strength)   If you develop nausea and vomiting that is not controlled by your nausea medication, call the clinic. If it is after clinic hours your family physician or the after hours number for the clinic or go to the Emergency Department.   BELOW ARE SYMPTOMS THAT SHOULD BE REPORTED IMMEDIATELY:  *FEVER GREATER THAN 100.5 F  *CHILLS WITH OR WITHOUT FEVER  NAUSEA AND VOMITING THAT IS NOT CONTROLLED WITH YOUR NAUSEA MEDICATION  *UNUSUAL SHORTNESS OF BREATH  *UNUSUAL BRUISING OR BLEEDING  TENDERNESS IN MOUTH AND THROAT WITH OR WITHOUT PRESENCE OF ULCERS  *URINARY PROBLEMS  *BOWEL PROBLEMS  UNUSUAL RASH Items with * indicate a potential emergency and should be followed up as soon as possible.  One of the nurses will contact you 24 hours after your treatment. Please let the nurse know about any problems that you may have experienced. Feel free to call the clinic you have any questions or concerns. The clinic phone number is [phone].   I have been informed and understand all the instructions given to me. I know to contact the clinic, my physician, or go to the Emergency Department if any problems should occur. I do not have any questions at this time, but understand that I may call the clinic during office hours   should I have any questions or need assistance in obtaining follow up care.    __________________________________________  _____________  __________ Signature of Patient or Authorized Representative            Date                    Time    __________________________________________ Nurse's Signature

## 2012-06-26 ENCOUNTER — Encounter: Payer: Self-pay | Admitting: Oncology

## 2012-06-26 ENCOUNTER — Other Ambulatory Visit: Payer: Self-pay | Admitting: Certified Registered Nurse Anesthetist

## 2012-07-02 ENCOUNTER — Telehealth: Payer: Self-pay | Admitting: Oncology

## 2012-07-02 ENCOUNTER — Ambulatory Visit: Payer: Medicaid Other | Attending: Oncology

## 2012-07-02 ENCOUNTER — Other Ambulatory Visit: Payer: Self-pay | Admitting: *Deleted

## 2012-07-02 ENCOUNTER — Other Ambulatory Visit: Payer: Self-pay | Admitting: Oncology

## 2012-07-02 ENCOUNTER — Other Ambulatory Visit (HOSPITAL_BASED_OUTPATIENT_CLINIC_OR_DEPARTMENT_OTHER): Payer: Medicaid Other

## 2012-07-02 ENCOUNTER — Ambulatory Visit: Payer: Self-pay

## 2012-07-02 ENCOUNTER — Ambulatory Visit (HOSPITAL_BASED_OUTPATIENT_CLINIC_OR_DEPARTMENT_OTHER): Payer: Medicaid Other | Admitting: Oncology

## 2012-07-02 VITALS — BP 121/75 | HR 88 | Temp 97.0°F | Resp 20 | Ht 73.0 in | Wt 144.3 lb

## 2012-07-02 DIAGNOSIS — IMO0001 Reserved for inherently not codable concepts without codable children: Secondary | ICD-10-CM | POA: Insufficient documentation

## 2012-07-02 DIAGNOSIS — C779 Secondary and unspecified malignant neoplasm of lymph node, unspecified: Secondary | ICD-10-CM

## 2012-07-02 DIAGNOSIS — C760 Malignant neoplasm of head, face and neck: Secondary | ICD-10-CM

## 2012-07-02 DIAGNOSIS — C029 Malignant neoplasm of tongue, unspecified: Secondary | ICD-10-CM

## 2012-07-02 DIAGNOSIS — C50919 Malignant neoplasm of unspecified site of unspecified female breast: Secondary | ICD-10-CM

## 2012-07-02 DIAGNOSIS — C021 Malignant neoplasm of border of tongue: Secondary | ICD-10-CM

## 2012-07-02 DIAGNOSIS — C7951 Secondary malignant neoplasm of bone: Secondary | ICD-10-CM

## 2012-07-02 DIAGNOSIS — C7800 Secondary malignant neoplasm of unspecified lung: Secondary | ICD-10-CM

## 2012-07-02 DIAGNOSIS — C7952 Secondary malignant neoplasm of bone marrow: Secondary | ICD-10-CM

## 2012-07-02 DIAGNOSIS — R1311 Dysphagia, oral phase: Secondary | ICD-10-CM | POA: Insufficient documentation

## 2012-07-02 DIAGNOSIS — L270 Generalized skin eruption due to drugs and medicaments taken internally: Secondary | ICD-10-CM

## 2012-07-02 DIAGNOSIS — Z85819 Personal history of malignant neoplasm of unspecified site of lip, oral cavity, and pharynx: Secondary | ICD-10-CM | POA: Insufficient documentation

## 2012-07-02 DIAGNOSIS — K117 Disturbances of salivary secretion: Secondary | ICD-10-CM

## 2012-07-02 LAB — BASIC METABOLIC PANEL (CC13)
CO2: 28 mEq/L (ref 22–29)
Calcium: 9.5 mg/dL (ref 8.4–10.4)
Chloride: 105 mEq/L (ref 98–107)
Sodium: 141 mEq/L (ref 136–145)

## 2012-07-02 LAB — CBC WITH DIFFERENTIAL/PLATELET
BASO%: 1.3 % (ref 0.0–2.0)
MCHC: 33.5 g/dL (ref 32.0–36.0)
MONO#: 0.1 10*3/uL (ref 0.1–0.9)
RBC: 2.79 10*6/uL — ABNORMAL LOW (ref 4.20–5.82)
WBC: 1.3 10*3/uL — ABNORMAL LOW (ref 4.0–10.3)
lymph#: 0.4 10*3/uL — ABNORMAL LOW (ref 0.9–3.3)

## 2012-07-02 NOTE — Telephone Encounter (Signed)
gv pt appt schedule for October thru December including ct scan for 10/24. This office is not able to schedule dermatology appt due to pt's ins. I called 3 dermatology offices and the 1st two will not accept pt's ins [facility]) and the 3rd office - [facility] will accept ins but per [facility] referral needs to be made by primary listed on card. Pt aware and will call [facility] to get referral to [facility]. Message to [facility].

## 2012-07-05 ENCOUNTER — Encounter: Payer: Self-pay | Admitting: Oncology

## 2012-07-05 ENCOUNTER — Ambulatory Visit (HOSPITAL_COMMUNITY)
Admission: RE | Admit: 2012-07-05 | Discharge: 2012-07-05 | Disposition: A | Discharge status: Home / Self Care | Payer: Medicaid Other | Source: Ambulatory Visit | Attending: Oncology | Admitting: Oncology

## 2012-07-05 ENCOUNTER — Encounter (HOSPITAL_COMMUNITY): Payer: Self-pay

## 2012-07-05 DIAGNOSIS — Z1289 Encounter for screening for malignant neoplasm of other sites: Secondary | ICD-10-CM | POA: Insufficient documentation

## 2012-07-05 DIAGNOSIS — C029 Malignant neoplasm of tongue, unspecified: Secondary | ICD-10-CM

## 2012-07-05 DIAGNOSIS — C801 Malignant (primary) neoplasm, unspecified: Secondary | ICD-10-CM | POA: Insufficient documentation

## 2012-07-05 HISTORY — DX: Secondary malignant neoplasm of unspecified lung: C78.00

## 2012-07-05 MED ORDER — IOHEXOL 300 MG/ML  SOLN
125.0000 mL | Freq: Once | INTRAMUSCULAR | Status: AC | PRN
  Administered 2012-07-05: 125 mL via INTRAVENOUS

## 2012-07-05 NOTE — Progress Notes (Signed)
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 He now developed biopsy proven metastatic disease in 03/2012 with met in right neck, left lung mass and left illac.   PAST THERAPY:  1. 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 He developed recurrence in the left submandibular gland in August 2012. He underwent on 07/27/2011 radical left neck dissection by Dr. Ezzard Standing and Dr. Geryl Rankins. Pathology case number [id number] showed total 15 lymph nodes from left radical neck dissection that was all negative. However the left submandibular gland mass showed a 3.5 cm invasive SCC, moderately differentiates involving the adjacent skeletal muscle tissue and bone a tissue and extending into the inked margin. There was no evidence of any lymphatic invasion or perineural invasion. He underwent further resection by Dr. Mary Sella at [facility] on 09/29/2011.  2. He received adjuvant chemoradiation therapy with q 3 week Cisplatin given 11/15/11-12/05/11 (received 2 of 3 doses; 3rd dose held due to febrile neutropenia and development of flap abscess). He received concurrent XRT 11/15/11 through 01/05/12.  3. For right neck recurrence in 03/2012; he underwent palliative right neck dissection by Dr. Manson Passey at [facility].   CURRENT THERAPY: started on 05/15/2012 palliative chemo weekly Carboplatin/Taxol/Erbitux; 3 weeks on; 1 week off. Erbitux was placed on hold with the last cycle due to rash.   INTERVAL HISTORY: Thomas Bean 51 y.o. male returns for regular follow up by himself.  He has mild fatigue.  He is still 100% PEG tube dependent because of inability to open his jaw wide due to past treatments.  He will see ST today. He has less pain in the mouth.   He takes Lortab elixir a few times a day.  His skin rash on the scalp, face, chest has significantly improved having 2 weeks off of Erbitux and using Hydrocortisone and CleocinT gel. Rash to bilateral axillae is still present. Skin burns, but he thinks it is slightly better. He denied fever, cough, SOB, chest pain, abd pain, PEG tube infection/pain, diarrhea, bleeding symptoms. He has been seen by the dentist with clearance for Zometa.  Past Medical History  Diagnosis Date  . Hemorrhoids   . PONV (postoperative nausea and vomiting)   . Hypertension     no medications at present  . GERD (gastroesophageal reflux disease)     no medication  . Depression     treated approx 12 years ago, no treatment at this time  . Hiatal hernia   . Neutropenia 12/23/2011  . Abscess 12/27/2011  . Hx of radiation therapy 11/15/11 -01/05/12    left lateral tongue  . Lesion of left lung 03/14/12    CT chest, [facility], squamous cell carcinoma  . Facial rash 05/24/2012  . Cancer 12/2010    left side of tongue T1 lesion excised 4/12  . Bone metastasis 04/25/12    PET scan-left iliac bone  . Lung metastasis dx'd 03/2012    Past Surgical History  Procedure Date  . Inguinal herniorrhapy   . Tonsillectomy and adenoidectomy   . Excision of tongue mass     April 2012  . Hernia repair   . Neck mass excision 07/27/2011  . Radical neck dissection 07/27/2011    Procedure: RADICAL NECK DISSECTION;  Surgeon: Drema Halon,  MD;  Location: [facility] OR;  Service: ENT;  Laterality: Left;  Marland Kitchen Mandibulectomy 09/2011    and scapular free flap  . Modified radical neck dissection 03/27/12    excision of lip lesion, lip benign, right neck squamous cell ca    Current Outpatient Prescriptions  Medication Sig Dispense Refill  . clindamycin (CLEOCIN-T) 1 % gel Apply topically 2 (two) times daily. Apply to facial and chest skin rash.  30 g  3  . clobetasol cream (TEMOVATE) 0.05 % Apply topically 2 (two) times daily. Apply to rash BID  60 g   2  . doxycycline (VIBRAMYCIN) 100 MG capsule Take 100 mg by mouth 2 (two) times daily.      Marland Kitchen gabapentin (NEURONTIN) 300 MG capsule Take 1 capsule (300 mg total) by mouth 3 (three) times daily.  90 capsule  3  . HYDROcodone-acetaminophen (NORCO) 5-325 MG per tablet Take 1 tablet by mouth every 6 (six) hours as needed for pain.  120 tablet  0  . hydrocortisone cream 0.5 % Apply topically 2 (two) times daily as needed. Apply to facial rash twice daily as needed.  30 g  0  . ibuprofen (ADVIL,MOTRIN) 200 MG tablet Take 400 mg by mouth every 6 (six) hours as needed. Pain       . LORazepam (ATIVAN) 0.5 MG tablet Take 1 tablet (0.5 mg total) by mouth every 6 (six) hours as needed (Nausea).  30 tablet  2  . nystatin (MYCOSTATIN) powder Apply topically 4 (four) times daily. Apply to Rash underarms as directed.  Stop using if rash worsens and notify Dr. Lodema Pilot office.  30 g  0  . omeprazole (PRILOSEC) 10 MG capsule Take 10 mg by mouth daily.      . ondansetron (ZOFRAN) 8 MG tablet Take 8 mg by mouth every 12 (twelve) hours as needed.      . prochlorperazine (COMPAZINE) 10 MG tablet Take 10 mg by mouth every 6 (six) hours as needed.      . sodium fluoride (PREVIDENT 5000 PLUS) 1.1 % CREA dental cream Apply thin ribbon of cream to tooth brush. Brush teeth for 2 minutes. Spit out excess-DO NOT swallow. DO NOT rinse afterwards. Repeat nightly.  1 Tube  prn   No current facility-administered medications for this visit.   Facility-Administered Medications Ordered in Other Visits  Medication Dose Route Frequency Provider Last Rate Last Dose  . iohexol (OMNIPAQUE) 300 MG/ML solution 125 mL  125 mL Intravenous Once PRN Medication Radiologist, MD   125 mL at 07/05/12 1219    ALLERGIES:  is allergic to codeine phosphate and tape.  REVIEW OF SYSTEMS:  The rest of the 14-point review of system was negative.   Filed Vitals:   07/02/12 1332  BP: 121/75  Pulse: 88  Temp: 97 F (36.1 C)  Resp: 20   Wt Readings  from Last 3 Encounters:  07/02/12 144 lb 4.8 oz (65.454 kg)  06/11/12 144 lb 12.8 oz (65.681 kg)  05/28/12 145 lb 8 oz (65.998 kg)   ECOG Performance status: 1  PHYSICAL EXAMINATION:   General: thin-appearing man, in no acute distress. Eyes: no scleral icterus. ENT: There were no oropharyngeal lesions on my unaided exam. Neck was without thyromegaly. There was a flap in the left jaw which has healed. Right neck was fibrotic from recent resection. There was no erythema, purulent discharge, pain on palpation. Lymphatics: no clear palpable adenopathy.  Respiratory: lungs were clear bilaterally without wheezing or crackles. Cardiovascular: Regular  rate and rhythm, S1/S2, without murmur, rub or gallop. There was no pedal edema. GI: abdomen was soft, flat, nontender, nondistended, without organomegaly. PEG tube was dry, clean, intact. Muscoloskeletal: no spinal tenderness of palpation of vertebral spine. Skin exam was without echymosis, petichae. There was less numerous aciniform skin rash on the face, trunk, and scalp. The existing lesions were more healed than 2 weeks ago.  Red, excoriated rash to bilateral axillae. No drainage noted. Neuro exam was nonfocal. Patient was able to get on and off exam table without assistance. Gait was normal. Patient was alerted and oriented. Attention was good. Language was appropriate. Mood was normal without depression. Speech was not pressured. Thought content was not tangential.     LABORATORY/RADIOLOGY DATA:  Lab Results  Component Value Date   WBC 1.3* 07/02/2012   HGB 9.1* 07/02/2012   HCT 27.1* 07/02/2012   PLT 189 07/02/2012   GLUCOSE 99 07/02/2012   CHOL 160 02/14/2008   TRIG 442* 02/14/2008   HDL 26.5* 02/14/2008   LDLDIRECT 64.8 02/14/2008   ALKPHOS 53 06/18/2012   ALT 24 06/18/2012   AST 25 06/18/2012   NA 141 07/02/2012   K 3.6 07/02/2012   CL 105 07/02/2012   CREATININE 0.6* 07/02/2012   BUN 10.0 07/02/2012   CO2 28 07/02/2012     ASSESSMENT AND  PLAN:   1. Smoking: none at this time.   2. Recurrent left lateral tongue squamous cell carcinoma: He now has metastatic disease. Left lung mass and left iliac bone lesion were both active on PET scan.  -Chemo: Carboplatin/Taxol/Erbitux: Once a week; 3 weeks on; 1 week of. Erbitux was held with the last cycle due to rash.  - He now has grade 1 fatigue; grade 1-2 skin rash, grade 2 neutropenia and anemia. Carboplatin Taxol were reduced by 25%. Despite dose reduction, he is still neutropenia. Will obtain a CT of the neck, chest, abdomen, and pelvis later this week. May need to consider changing chemo to 2 weeks one, 1 week off every 3 week cycle as opposed to every 4 week-cycle. Decision will  Be made after CT scans.   3. Anemia: This is from anemia of chronic disease. Hemoglobin is slowly improving. No active bleeding. No transfusion indicated.   4. Calorie/Protein malnutrition: stable weight. 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  He will see Vernell Leep today to see if he can increase feeding solution.   5. Mouth pain from treatment: He is on Vicodin prn.   6. Skin rash: Due to Erbitux. This is now grade 1-2.  I reduced Erbitux dose today. Rash to axillae is not consistent in appearance to rash typically seen with Erbitux. Mild improvement with Hydrocortisone and Clindamycin gel. Will refer to Dermatology.   7.  Bone met: He is cleared for Zometa from a dental standpoint. Will plan to start when he resumes chemotherapy.   8. Follow up: 1 week.  The length of time of the face-to-face encounter was 25 minutes. More than 50% of time was spent counseling and coordination of care.

## 2012-07-09 ENCOUNTER — Encounter: Payer: Self-pay | Admitting: Nutrition

## 2012-07-09 ENCOUNTER — Other Ambulatory Visit (HOSPITAL_BASED_OUTPATIENT_CLINIC_OR_DEPARTMENT_OTHER): Payer: Medicaid Other

## 2012-07-09 ENCOUNTER — Telehealth: Payer: Self-pay | Admitting: Oncology

## 2012-07-09 ENCOUNTER — Ambulatory Visit (HOSPITAL_BASED_OUTPATIENT_CLINIC_OR_DEPARTMENT_OTHER): Payer: Medicaid Other | Admitting: Oncology

## 2012-07-09 ENCOUNTER — Ambulatory Visit: Payer: Self-pay | Admitting: Lab

## 2012-07-09 ENCOUNTER — Ambulatory Visit: Payer: Self-pay

## 2012-07-09 VITALS — BP 122/77 | HR 79 | Temp 98.1°F | Resp 20 | Ht 73.0 in | Wt 146.2 lb

## 2012-07-09 DIAGNOSIS — C029 Malignant neoplasm of tongue, unspecified: Secondary | ICD-10-CM

## 2012-07-09 DIAGNOSIS — C7951 Secondary malignant neoplasm of bone: Secondary | ICD-10-CM

## 2012-07-09 DIAGNOSIS — D638 Anemia in other chronic diseases classified elsewhere: Secondary | ICD-10-CM

## 2012-07-09 DIAGNOSIS — C7800 Secondary malignant neoplasm of unspecified lung: Secondary | ICD-10-CM

## 2012-07-09 DIAGNOSIS — C01 Malignant neoplasm of base of tongue: Secondary | ICD-10-CM

## 2012-07-09 DIAGNOSIS — C7952 Secondary malignant neoplasm of bone marrow: Secondary | ICD-10-CM

## 2012-07-09 LAB — CBC WITH DIFFERENTIAL/PLATELET
BASO%: 2.9 % — ABNORMAL HIGH (ref 0.0–2.0)
Basophils Absolute: 0.1 10*3/uL (ref 0.0–0.1)
EOS%: 0.6 % (ref 0.0–7.0)
HCT: 29 % — ABNORMAL LOW (ref 38.4–49.9)
MCH: 31.8 pg (ref 27.2–33.4)
MCHC: 32.4 g/dL (ref 32.0–36.0)
MCV: 98 fL (ref 79.3–98.0)
MONO%: 16.6 % — ABNORMAL HIGH (ref 0.0–14.0)
NEUT%: 43.3 % (ref 39.0–75.0)
lymph#: 0.6 10*3/uL — ABNORMAL LOW (ref 0.9–3.3)

## 2012-07-09 NOTE — Telephone Encounter (Signed)
gv pt new appt schedule for November and December. Per 10/28 chemo change to wkly 2wks on and 1wk off to start 11/4. appts adjusted accordingly. Also per pof no tx today.

## 2012-07-09 NOTE — Patient Instructions (Addendum)
1.  Diagnosis:  Metastatic head/neck cancer. 2.  CT scan:  Smaller lung lesion on CT chest; but slightly bigger bone lesion on the left iliac. 3.  Recommendation:  Continue with chemo Carbo/Taxol/Erbitux; weekly; 3 weeks on, 1 week off.  4.  I will refer you back to Dr. Dayton Scrape to see if he would consider irradiating your left iliac bone met.  We will need to be off of chemo while on radiation to decrease risk of low blood count. 5.  In the future, if and when there is disease progression, I can refer you to head/neck cancer doctor in [facility] to see if there is any clinical trial.

## 2012-07-10 NOTE — Progress Notes (Signed)
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 He now developed biopsy proven metastatic disease in 03/2012 with met in right neck, left lung mass and left illac.   PAST THERAPY:  1. 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 He developed recurrence in the left submandibular gland in August 2012. He underwent on 07/27/2011 radical left neck dissection by Dr. Ezzard Standing and Dr. Geryl Rankins. Pathology case number [id number] showed total 15 lymph nodes from left radical neck dissection that was all negative. However the left submandibular gland mass showed a 3.5 cm invasive SCC, moderately differentiates involving the adjacent skeletal muscle tissue and bone a tissue and extending into the inked margin. There was no evidence of any lymphatic invasion or perineural invasion. He underwent further resection by Dr. Mary Sella at [facility] on 09/29/2011.  2. He received adjuvant chemoradiation therapy with q 3 week Cisplatin given 11/15/11-12/05/11 (received 2 of 3 doses; 3rd dose held due to febrile neutropenia and development of flap abscess). He received concurrent XRT 11/15/11 through 01/05/12.  3. For right neck recurrence in 03/2012; he underwent palliative right neck dissection by Dr. Manson Passey at [facility].   CURRENT THERAPY: started on 05/15/2012 palliative chemo weekly Carboplatin/Taxol/Erbitux; 3 weeks on; 1 week off  INTERVAL HISTORY: Thomas Bean 51 y.o. male returns for regular follow up with his father.  Being off of cetuximab for the last few weeks, his bilateral axilary skin rash has significantly improved.  He only has dark skin over the areas without erythema, purulent discharge, pruritus, or pain. The skin rash on his face, and chest  has also  He has mild fatigue being on chemo.  He is still independent of activities of daily living.  However, he does not leave the house much.  He has fibrosis of the right neck from recent neck dissection but with mild pain.  He does not feel any discreet mass.  He denied cough, chest pain, SOB, palpitation, fever.  He continues to use PEG tube for feeding.  His weight is relatively stable.   Patient denies headache, visual changes, confusion, drenching night sweats, palpable lymph node swelling, mucositis, odynophagia, dysphagia, nausea vomiting, jaundice, chest pain, palpitation, shortness of breath, dyspnea on exertion, productive cough, gum bleeding, epistaxis, hematemesis, hemoptysis, abdominal pain, abdominal swelling, early satiety, melena, hematochezia, hematuria, skin rash, spontaneous bleeding, joint swelling, joint pain, heat or cold intolerance, bowel bladder incontinence, back pain, focal motor weakness, paresthesia, depression.    Past Medical History  Diagnosis Date  . Hemorrhoids   . PONV (postoperative nausea and vomiting)   . Hypertension     no medications at present  . GERD (gastroesophageal reflux disease)     no medication  . Depression     treated approx 12 years ago, no treatment at this time  . Hiatal hernia   . Neutropenia 12/23/2011  . Abscess 12/27/2011  . Hx of radiation therapy 11/15/11 -01/05/12    left lateral tongue  . Lesion of left lung 03/14/12    CT chest, [facility], squamous cell carcinoma  . Facial rash 05/24/2012  . Cancer 12/2010    left side of tongue T1 lesion excised 4/12  . Bone metastasis 04/25/12    PET scan-left iliac bone  . Lung metastasis  dx'd 03/2012    Past Surgical History  Procedure Date  . Inguinal herniorrhapy   . Tonsillectomy and adenoidectomy   . Excision of tongue mass     April 2012  . Hernia repair   . Neck mass excision 07/27/2011  . Radical neck dissection 07/27/2011    Procedure: RADICAL NECK DISSECTION;  Surgeon:  Drema Halon, MD;  Location: [facility] OR;  Service: ENT;  Laterality: Left;  Marland Kitchen Mandibulectomy 09/2011    and scapular free flap  . Modified radical neck dissection 03/27/12    excision of lip lesion, lip benign, right neck squamous cell ca    Current Outpatient Prescriptions  Medication Sig Dispense Refill  . clindamycin (CLEOCIN-T) 1 % gel Apply topically 2 (two) times daily. Apply to facial and chest skin rash.  30 g  3  . gabapentin (NEURONTIN) 300 MG capsule Take 1 capsule (300 mg total) by mouth 3 (three) times daily.  90 capsule  3  . HYDROcodone-acetaminophen (NORCO) 5-325 MG per tablet Take 1 tablet by mouth every 6 (six) hours as needed for pain.  120 tablet  0  . hydrocortisone cream 0.5 % Apply topically 2 (two) times daily as needed. Apply to facial rash twice daily as needed.  30 g  0  . ibuprofen (ADVIL,MOTRIN) 200 MG tablet Take 400 mg by mouth every 6 (six) hours as needed. Pain       . LORazepam (ATIVAN) 0.5 MG tablet Take 1 tablet (0.5 mg total) by mouth every 6 (six) hours as needed (Nausea).  30 tablet  2  . omeprazole (PRILOSEC) 10 MG capsule Take 10 mg by mouth daily.      . ondansetron (ZOFRAN) 8 MG tablet Take 8 mg by mouth every 12 (twelve) hours as needed.      . prochlorperazine (COMPAZINE) 10 MG tablet Take 10 mg by mouth every 6 (six) hours as needed.      . sodium fluoride (PREVIDENT 5000 PLUS) 1.1 % CREA dental cream Apply thin ribbon of cream to tooth brush. Brush teeth for 2 minutes. Spit out excess-DO NOT swallow. DO NOT rinse afterwards. Repeat nightly.  1 Tube  prn  . clobetasol cream (TEMOVATE) 0.05 % Apply topically 2 (two) times daily. Apply to rash BID  60 g  2    ALLERGIES:  is allergic to codeine phosphate and tape.  REVIEW OF SYSTEMS:  The rest of the 14-point review of system was negative.   Filed Vitals:   07/09/12 1339  BP: 122/77  Pulse: 79  Temp: 98.1 F (36.7 C)  Resp: 20   Wt Readings from Last 3 Encounters:  07/09/12 146 lb 3.2 oz  (66.316 kg)  07/02/12 144 lb 4.8 oz (65.454 kg)  06/11/12 144 lb 12.8 oz (65.681 kg)   ECOG Performance status: 1  PHYSICAL EXAMINATION:   General: thin-appearing man, in no acute distress. Eyes: no scleral icterus. ENT: There were no oropharyngeal lesions on my unaided exam. Neck was without thyromegaly. There was a flap in the left jaw which has healed. Right neck was fibrotic from resection. There was no erythema, purulent discharge, pain on palpation. Lymphatics: no clear palpable adenopathy. Respiratory: lungs were clear bilaterally without wheezing or crackles. Cardiovascular: Regular rate and rhythm, S1/S2, without murmur, rub or gallop. There was no pedal edema. GI: abdomen was soft, flat, nontender, nondistended, without organomegaly. PEG tube was dry, clean, intact. Muscoloskeletal: no spinal tenderness of palpation of vertebral spine. Skin exam was without echymosis, petichae.  There was mild aciniform skin rash on the face, trunk, and scalp. There was dry, dark skin in the bilateral axilla where the skin rash used to be.  Neuro exam was nonfocal. Patient was able to get on and off exam table without assistance. Gait was normal. Patient was alerted and oriented. Attention was good. Language was appropriate. Mood was normal without depression. Speech was not pressured. Thought content was not tangential.    LABORATORY/RADIOLOGY DATA:  Lab Results  Component Value Date   WBC 1.8* 07/09/2012   HGB 9.4* 07/09/2012   HCT 29.0* 07/09/2012   PLT 169 07/09/2012   GLUCOSE 99 07/02/2012   CHOL 160 02/14/2008   TRIG 442* 02/14/2008   HDL 26.5* 02/14/2008   LDLDIRECT 64.8 02/14/2008   ALKPHOS 53 06/18/2012   ALT 24 06/18/2012   AST 25 06/18/2012   NA 141 07/02/2012   K 3.6 07/02/2012   CL 105 07/02/2012   CREATININE 0.6* 07/02/2012   BUN 10.0 07/02/2012   CO2 28 07/02/2012    Ct Soft Tissue Neck W Contrast  07/05/2012  *RADIOLOGY REPORT*  Clinical Data: Squamous cell cancer of the tongue.   Lung and bone metastases.  Status post radiation and chemotherapy.  CT NECK WITH CONTRAST  Technique:  Multidetector CT imaging of the neck was performed with intravenous contrast.  Contrast: OMNIPAQUE IOHEXOL 300 MG/ML  SOLN  Comparison: CT of the neck with contrast 07/11/2011.  Findings: The patient is status post resection of the left submandibular gland.  The patient is status post resection of the submandibular glands bilaterally.  Postradiation she mentioned are present within the submandibular and submental space.  Asymmetric fat on the left likely represents a myocutaneous flap.  The left mandible has been partially resected and reconstructed. The mandibular graft is intact.  No residual or recurrent tumor is evident.  The hardware is intact.  No focal mucosal or submucosal lesions are evident.  No significant adenopathy is present.  Mild degenerative changes are present in the lower cervical spine, most evident at C6-7.  There is no evidence for residual or recurrent tumor.  IMPRESSION:  1.  Status post bilateral mandibular gland resection. 2.  Status post partial left mandibulectomy and ORIF. 3.  No evidence for residual or recurrent tumor. 4.  A myocutaneous flap is in place.   Original Report Authenticated By: Jamesetta Orleans. MATTERN, M.D.    Ct Chest W Contrast  07/05/2012  *RADIOLOGY REPORT*  Clinical Data:  Submandibular carcinoma.  CT CHEST, ABDOMEN AND PELVIS WITH CONTRAST  Technique:  Multidetector CT imaging of the chest, abdomen and pelvis was performed following the standard protocol during bolus administration of intravenous contrast.  Contrast: OMNIPAQUE IOHEXOL 300 MG/ML  SOLN  Comparison:  PET CT scan 04/25/2012, CT scan 07/05/2012  CT CHEST  Findings:  No axillary or supraclavicular lymphadenopathy.  No mediastinal or hilar lymphadenopathy.  Small amount of gas within the mediastinum primarily at the level of the carina adjacent to the left main stem bronchus but also  scattered more superiorly and inferiorly.  This along the course of the esophagus as well as the bronchus.  Review of the lung windows demonstrates decrease in the size of the left lower lobe cavitary lesion.  There is now a thin-walled cavitary lesion measuring 2.6 x 2.7 cm where previously there was a thickened nodular cavitary lesion.  There is no evidence of pneumonitis or pneumothorax within the left or right hemithorax.  IMPRESSION:  1.  Interval  decrease in size of left lower lobe cavitary mass following therapy. 2. Gas within the mediastinum adjacent to the esophagus and left mainstem bronchus.  The etiology of this gas is not readily apparent but no infection evident within the mediastinum. Favor airway as the source.  CT ABDOMEN AND PELVIS  Findings:  No focal hepatic lesion.  The gallbladder is has a mildly thickened wall.  The pancreas is normal.  There is a peripherally calcified cystic lesion with multi lobulations within the spleen unchanged.  The adrenal glands and kidneys are unchanged with nonenhancing cysts in the right kidney.  The stomach is normal.  A percutaneous gastrostomy tube in the stomach.  The small bowel and colon are normal.  Abdominal aorta is normal caliber.  No retroperitoneal periportal lymphadenopathy.  No free fluid the pelvis.  No pelvic lymphadenopathy.  A lytic lesion within the posterior left aspect of the iliac bone is larger than on the PET CT exam measuring 12 mm compared to 5 mm.  IMPRESSION:  1.  No evidence of metastasis in the abdomen or pelvic soft tissues. 2.  Lytic lesion in the posterior aspect of the left iliac bone is increased size compared to prior.  This was hypermetabolic on comparison PET CT scan.  Findings discussed with Dr. Gaylyn Rong on the 07/05/2012 at 1530 hours.   Original Report Authenticated By: Genevive Bi, M.D.      ASSESSMENT AND PLAN:   1. Smoking: none at this time.   2. 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  This last CT scan showed mixed response with smaller lung lesion with with slight enlargement of the left iliac bone lesion (which could be due to sclerotic response).  - As he has having benefit from chemo (without obvious disease progression), I recommended him to continue chemo Carboplatin/Taxol/Erbitux.  - He had grade 3 skin rash in bilateral axilla, grade 2 neutropenia, grade 1 anemia despite previous dose reduction.  I recommended to reduce chemo Carbplatin/Taxol further to 60% and continue Erbitux at 75%.  I recommended changing chemo from 3 weeks on, 1 week off to 2 weeks of, 1 week off.  I will delay chemo for 1 week due to neutropenia. -He expressed informed understanding and wished to continue with chemo as recommended.  A repeat staging CT is due in mid/late Jan 2014 but sooner if he has concerning symptoms.  If and when he has disease progression, we may referring him to [facility] or [facility] for potential clinical trial.   3.  Bone met: limited; slightly enlarged on the restaging CT which could be sclerotic change in response to chemo.  However, I will refer him to Dr. Dayton Scrape to see if there is role for prophylactic radiation.  He will continue with Zometa every 6 weeks to decrease risk of skeletal-related event.   4.  Gas within mediastinum:  Unclear etiology.  He is not symptomatic without chest pain, fever, cough, pleurisy.  He has not had recent operation.  Given metastatic disease, and asymptomatic state, I do not think that surgical intervention is indicated.  He did not want referral to CVTS yet.   5. Anemia: This is from anemia of chronic disease. There is o active bleeding. No transfusion indicated.   6. Calorie/Protein malnutrition: stable weight. 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 He will see Vernell Leep today to see if he can increase feeding solution.   7.  Mouth pain from treatment: He is on Vicodin prn.   8. Skin rash:  Due to Erbitux.  He is on Cleocin cream and Hydrocortisone cream prn.  He did continue Doxycycline due to potential role in skin rash.   9.  Follow up: once per cycle.      The length of time of the face-to-face encounter was 25 minutes. More than 50% of time was spent counseling and coordination of care.

## 2012-07-12 ENCOUNTER — Ambulatory Visit: Payer: Self-pay | Admitting: Oncology

## 2012-07-12 ENCOUNTER — Other Ambulatory Visit: Payer: Self-pay | Admitting: Lab

## 2012-07-13 ENCOUNTER — Other Ambulatory Visit: Payer: Self-pay | Admitting: Oncology

## 2012-07-13 ENCOUNTER — Other Ambulatory Visit: Payer: Self-pay | Admitting: Pharmacist

## 2012-07-13 ENCOUNTER — Encounter: Payer: Self-pay | Admitting: Oncology

## 2012-07-13 DIAGNOSIS — C029 Malignant neoplasm of tongue, unspecified: Secondary | ICD-10-CM

## 2012-07-16 ENCOUNTER — Ambulatory Visit: Payer: Self-pay

## 2012-07-16 ENCOUNTER — Other Ambulatory Visit (HOSPITAL_BASED_OUTPATIENT_CLINIC_OR_DEPARTMENT_OTHER): Payer: Medicaid Other

## 2012-07-16 ENCOUNTER — Ambulatory Visit (HOSPITAL_BASED_OUTPATIENT_CLINIC_OR_DEPARTMENT_OTHER): Payer: Medicaid Other

## 2012-07-16 ENCOUNTER — Ambulatory Visit: Payer: Medicaid Other | Admitting: Nutrition

## 2012-07-16 VITALS — BP 116/66 | HR 78 | Temp 98.0°F | Resp 20

## 2012-07-16 DIAGNOSIS — Z5111 Encounter for antineoplastic chemotherapy: Secondary | ICD-10-CM

## 2012-07-16 DIAGNOSIS — C770 Secondary and unspecified malignant neoplasm of lymph nodes of head, face and neck: Secondary | ICD-10-CM

## 2012-07-16 DIAGNOSIS — C7951 Secondary malignant neoplasm of bone: Secondary | ICD-10-CM

## 2012-07-16 DIAGNOSIS — C01 Malignant neoplasm of base of tongue: Secondary | ICD-10-CM

## 2012-07-16 DIAGNOSIS — C029 Malignant neoplasm of tongue, unspecified: Secondary | ICD-10-CM

## 2012-07-16 DIAGNOSIS — C760 Malignant neoplasm of head, face and neck: Secondary | ICD-10-CM

## 2012-07-16 DIAGNOSIS — Z5112 Encounter for antineoplastic immunotherapy: Secondary | ICD-10-CM

## 2012-07-16 LAB — CBC WITH DIFFERENTIAL/PLATELET
Basophils Absolute: 0 10*3/uL (ref 0.0–0.1)
Eosinophils Absolute: 0 10*3/uL (ref 0.0–0.5)
HCT: 31.2 % — ABNORMAL LOW (ref 38.4–49.9)
LYMPH%: 27 % (ref 14.0–49.0)
MCHC: 32.1 g/dL (ref 32.0–36.0)
MONO#: 0.3 10*3/uL (ref 0.1–0.9)
NEUT#: 1.6 10*3/uL (ref 1.5–6.5)
NEUT%: 62.3 % (ref 39.0–75.0)
Platelets: 164 10*3/uL (ref 140–400)
WBC: 2.6 10*3/uL — ABNORMAL LOW (ref 4.0–10.3)

## 2012-07-16 LAB — COMPREHENSIVE METABOLIC PANEL (CC13)
Albumin: 4 g/dL (ref 3.5–5.0)
BUN: 10 mg/dL (ref 7.0–26.0)
CO2: 30 mEq/L — ABNORMAL HIGH (ref 22–29)
Calcium: 9.6 mg/dL (ref 8.4–10.4)
Chloride: 107 mEq/L (ref 98–107)
Glucose: 88 mg/dl (ref 70–99)
Potassium: 3.7 mEq/L (ref 3.5–5.1)

## 2012-07-16 MED ORDER — CETUXIMAB CHEMO IV INJECTION 200 MG/100ML
187.5000 mg/m2 | Freq: Once | INTRAVENOUS | Status: AC
  Administered 2012-07-16: 300 mg via INTRAVENOUS
  Filled 2012-07-16: qty 150

## 2012-07-16 MED ORDER — PACLITAXEL CHEMO INJECTION 300 MG/50ML
48.0000 mg/m2 | Freq: Once | INTRAVENOUS | Status: AC
  Administered 2012-07-16: 90 mg via INTRAVENOUS
  Filled 2012-07-16: qty 15

## 2012-07-16 MED ORDER — SODIUM CHLORIDE 0.9 % IV SOLN
Freq: Once | INTRAVENOUS | Status: AC
  Administered 2012-07-16: 11:00:00 via INTRAVENOUS

## 2012-07-16 MED ORDER — DIPHENHYDRAMINE HCL 50 MG/ML IJ SOLN
50.0000 mg | Freq: Once | INTRAMUSCULAR | Status: AC
  Administered 2012-07-16: 50 mg via INTRAVENOUS

## 2012-07-16 MED ORDER — ONDANSETRON 16 MG/50ML IVPB (CHCC)
16.0000 mg | Freq: Once | INTRAVENOUS | Status: AC
  Administered 2012-07-16: 16 mg via INTRAVENOUS

## 2012-07-16 MED ORDER — DEXAMETHASONE SODIUM PHOSPHATE 4 MG/ML IJ SOLN
20.0000 mg | Freq: Once | INTRAMUSCULAR | Status: AC
  Administered 2012-07-16: 20 mg via INTRAVENOUS

## 2012-07-16 MED ORDER — SODIUM CHLORIDE 0.9 % IV SOLN
180.0000 mg | Freq: Once | INTRAVENOUS | Status: AC
  Administered 2012-07-16: 180 mg via INTRAVENOUS
  Filled 2012-07-16: qty 18

## 2012-07-16 MED ORDER — FAMOTIDINE IN NACL 20-0.9 MG/50ML-% IV SOLN
20.0000 mg | Freq: Once | INTRAVENOUS | Status: AC
  Administered 2012-07-16: 20 mg via INTRAVENOUS

## 2012-07-16 MED ORDER — ZOLEDRONIC ACID 4 MG/5ML IV CONC
4.0000 mg | Freq: Once | INTRAVENOUS | Status: AC
  Administered 2012-07-16: 4 mg via INTRAVENOUS
  Filled 2012-07-16: qty 5

## 2012-07-16 NOTE — Progress Notes (Signed)
1020-OK to treat with chemo today despite WBC-2.6 and today's lab counts per K. Curcio NP.  Proceed with Zometa today as ordered.

## 2012-07-16 NOTE — Patient Instructions (Addendum)
[facility] Discharge Instructions for Patients Receiving Chemotherapy  Today you received the following chemotherapy agents Zometa/Erbitux/Taxol/Carbo  To help prevent nausea and vomiting after your treatment, we encourage you to take your nausea medication.   If you develop nausea and vomiting that is not controlled by your nausea medication, call the clinic. If it is after clinic hours your family physician or the after hours number for the clinic or go to the Emergency Department.   BELOW ARE SYMPTOMS THAT SHOULD BE REPORTED IMMEDIATELY:  *FEVER GREATER THAN 100.5 F  *CHILLS WITH OR WITHOUT FEVER  NAUSEA AND VOMITING THAT IS NOT CONTROLLED WITH YOUR NAUSEA MEDICATION  *UNUSUAL SHORTNESS OF BREATH  *UNUSUAL BRUISING OR BLEEDING  TENDERNESS IN MOUTH AND THROAT WITH OR WITHOUT PRESENCE OF ULCERS  *URINARY PROBLEMS  *BOWEL PROBLEMS  UNUSUAL RASH Items with * indicate a potential emergency and should be followed up as soon as possible.  Feel free to call the clinic you have any questions or concerns. The clinic phone number is [phone].

## 2012-07-16 NOTE — Progress Notes (Signed)
Patient continues to report that he is doing well. His weight is up slightly to 146.2 pounds October 28 from 144.3 pounds October 21. He is tolerating bolus tube feedings utilizing Osmolite 1.5 spread throughout the day typically in 5 feedings. He does continue to use a whey protein powder, 2 scoops daily. He is drinking a lot of water by mouth as well as flushing water through his PEG as educated. He denies problems with his bowels. Patient reports that he has begun using glutamine via his PEG with M.D. Approval.  Nutrition diagnosis: Inadequate oral intake continues.  Intervention: Patient was encouraged to continue a minimum of 6 cans of Osmolite 1.5 with 1 1/2 - 2 scoops protein powder daily along with free water flushes and water by mouth as tolerated providing approximately 2330 calories, 149 g protein, 2586 mL of free water.  This does meet 100% of patient's estimated needs.  Monitoring, evaluation, goals: The patient continues to tolerate tube feedings with free water flushes and water by mouth. He has had a slight weight gain.  Next visit: Monday, December 2, during chemotherapy.

## 2012-07-17 ENCOUNTER — Ambulatory Visit
Admission: RE | Admit: 2012-07-17 | Discharge: 2012-07-17 | Disposition: A | Discharge status: Home / Self Care | Payer: Medicaid Other | Source: Ambulatory Visit | Attending: Radiation Oncology | Admitting: Radiation Oncology

## 2012-07-17 ENCOUNTER — Other Ambulatory Visit: Payer: Self-pay | Admitting: Certified Registered Nurse Anesthetist

## 2012-07-17 ENCOUNTER — Encounter: Payer: Self-pay | Admitting: Radiation Oncology

## 2012-07-17 VITALS — BP 135/68 | HR 74 | Temp 98.1°F | Resp 20 | Ht 72.0 in | Wt 144.6 lb

## 2012-07-17 DIAGNOSIS — C029 Malignant neoplasm of tongue, unspecified: Secondary | ICD-10-CM | POA: Insufficient documentation

## 2012-07-17 DIAGNOSIS — C7952 Secondary malignant neoplasm of bone marrow: Secondary | ICD-10-CM | POA: Insufficient documentation

## 2012-07-17 DIAGNOSIS — C770 Secondary and unspecified malignant neoplasm of lymph nodes of head, face and neck: Secondary | ICD-10-CM

## 2012-07-17 DIAGNOSIS — Z9221 Personal history of antineoplastic chemotherapy: Secondary | ICD-10-CM | POA: Insufficient documentation

## 2012-07-17 DIAGNOSIS — Z931 Gastrostomy status: Secondary | ICD-10-CM | POA: Insufficient documentation

## 2012-07-17 DIAGNOSIS — J984 Other disorders of lung: Secondary | ICD-10-CM | POA: Insufficient documentation

## 2012-07-17 DIAGNOSIS — C7951 Secondary malignant neoplasm of bone: Secondary | ICD-10-CM | POA: Insufficient documentation

## 2012-07-17 DIAGNOSIS — C801 Malignant (primary) neoplasm, unspecified: Secondary | ICD-10-CM | POA: Insufficient documentation

## 2012-07-17 NOTE — Progress Notes (Signed)
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 is a pleasant 51 year old male who is seen today for the courtesy of Dr. Gaylyn Rong for consideration of short course palliative radiation therapy to his left iliac bone in the management of his metastatic squamous cell carcinoma of the tongue to bone. He is been undergoing hemotherapy with carboplatin/Taxol/Erbitux since 05/15/2012. His staging workup 07/05/2012 showed interval decrease in the size of his left lower lobe cavitary mass, but local progression of a lytic lesion along the posterior aspect of the left iliac bone compared to his PET scan on 04/25/2012. He does report mild discomfort along his left SI joint region which she attributed to moderate weight loss and losing "padding" along his posterior pelvis. His quality of life has been good. He remains PEG tube dependent and has seen nutrition to see if he can increase his caloric intake.  Physical examination: Alert and oriented. Wt Readings from Last 3 Encounters:  07/17/12 144 lb 9.6 oz (65.59 kg)  07/09/12 146 lb 3.2 oz (66.316 kg)  07/02/12 144 lb 4.8 oz (65.454 kg)   Temp Readings from Last 3 Encounters:  07/17/12 98.1 F (36.7 C) Oral  07/16/12 98 F (36.7 C) Oral  07/09/12 98.1 F (36.7 C) Oral   BP Readings from Last 3 Encounters:  07/17/12 135/68  07/16/12 116/66  07/09/12 122/77   Pulse Readings from Last 3 Encounters:  07/17/12 74  07/16/12 78  07/09/12 79   Nodes: There is no palpable lymphadenopathy in the neck. Chest: Lungs clear. Abdomen: PEG tube, without masses organomegaly. Back is slight discomfort described along the lateral aspect of the left SI joint. Extremities: Without edema. Neurologic examination: Grossly nonfocal.  Laboratory data: Lab Results  Component Value Date   WBC 2.6*  07/16/2012   HGB 10.0* 07/16/2012   HCT 31.2* 07/16/2012   MCV 100.3* 07/16/2012   PLT 164 07/16/2012   CMP     Component Value Date/Time   NA 142 07/16/2012 0952   NA 138 06/25/2012 0858   K 3.7 07/16/2012 0952   K 3.1* 06/25/2012 0858   CL 107 07/16/2012 0952   CL 99 06/25/2012 0858   CO2 30* 07/16/2012 0952   CO2 30 06/25/2012 0858   GLUCOSE 88 07/16/2012 0952   GLUCOSE 88 06/25/2012 0858   BUN 10.0 07/16/2012 0952   BUN 10 06/25/2012 0858   CREATININE 0.7 07/16/2012 0952   CREATININE 0.47* 06/25/2012 0858   CALCIUM 9.6 07/16/2012 0952   CALCIUM 9.6 06/25/2012 0858   PROT 6.5 07/16/2012 0952   PROT 6.8 05/03/2012 0905   ALBUMIN 4.0 07/16/2012 0952   ALBUMIN 3.8 05/03/2012 0905   AST 13 07/16/2012 0952   AST 14 05/03/2012 0905   ALT 10 07/16/2012 0952   ALT 11 05/03/2012 0905   ALKPHOS 47 07/16/2012 0952   ALKPHOS 54 05/03/2012 0905   BILITOT 0.42 07/16/2012 0952   BILITOT 0.3 05/03/2012 0905   GFRNONAA >90 12/29/2011 0350   GFRAA >90 12/29/2011 0350   Impression: Metastatic squamous cell carcinoma the tongue to bone with a favorable response to chemotherapy except along his left iliac bone. I feel he would benefit from a short course of 3 fractions of radiation therapy directed to his left iliac bone. I would not expect this to significantly affect his blood counts. He'll undergo simulation this afternoon and  begin his radiation therapy early next week. We discussed the potential acute and late toxicities of radiation therapy which should be extremely well tolerated. Consent is signed today.  30 minutes was spent face-to-face with the patient, primarily counseling the patient in reviewing his scans.

## 2012-07-17 NOTE — Progress Notes (Signed)
Reconsult recurrent squamous cell  Carcinoma oral tongue ,metastatic  To the right neck, left lung mass and left iliac  Radiation therapy 11/15/11-01/05/12 left lateral tongue,  Chemotherapy Carboplatin/Taxol/Erbitux,  Palliative started 05/15/12,; 3 weeks on,1 week off, premed with zofran, dexamethasone, started  Here to discuss palliative radiation to left iliac   Alert,oriented x3, using peg tube 6 cans osmolite flushing free water  Before and after, as well as glutamine Pain left jaw, and right mandibular a 5 on a 1/10  Pain scale.  07/16/12 IV Zometa  ,

## 2012-07-23 ENCOUNTER — Ambulatory Visit (HOSPITAL_BASED_OUTPATIENT_CLINIC_OR_DEPARTMENT_OTHER): Payer: Medicaid Other

## 2012-07-23 ENCOUNTER — Inpatient Hospital Stay: Admission: RE | Admit: 2012-07-23 | Payer: Self-pay | Source: Ambulatory Visit

## 2012-07-23 ENCOUNTER — Ambulatory Visit
Admission: RE | Admit: 2012-07-23 | Discharge: 2012-07-23 | Disposition: A | Discharge status: Home / Self Care | Payer: Medicaid Other | Source: Ambulatory Visit | Attending: Radiation Oncology | Admitting: Radiation Oncology

## 2012-07-23 ENCOUNTER — Inpatient Hospital Stay
Admission: RE | Admit: 2012-07-23 | Discharge: 2012-07-23 | Disposition: A | Discharge status: Home / Self Care | Payer: Self-pay | Source: Ambulatory Visit | Attending: Radiation Oncology | Admitting: Radiation Oncology

## 2012-07-23 ENCOUNTER — Other Ambulatory Visit (HOSPITAL_BASED_OUTPATIENT_CLINIC_OR_DEPARTMENT_OTHER): Payer: Medicaid Other

## 2012-07-23 VITALS — BP 107/65 | HR 76 | Temp 98.4°F | Resp 20

## 2012-07-23 DIAGNOSIS — C785 Secondary malignant neoplasm of large intestine and rectum: Secondary | ICD-10-CM

## 2012-07-23 DIAGNOSIS — C760 Malignant neoplasm of head, face and neck: Secondary | ICD-10-CM

## 2012-07-23 DIAGNOSIS — C7951 Secondary malignant neoplasm of bone: Secondary | ICD-10-CM

## 2012-07-23 DIAGNOSIS — C029 Malignant neoplasm of tongue, unspecified: Secondary | ICD-10-CM

## 2012-07-23 DIAGNOSIS — C01 Malignant neoplasm of base of tongue: Secondary | ICD-10-CM

## 2012-07-23 DIAGNOSIS — Z5111 Encounter for antineoplastic chemotherapy: Secondary | ICD-10-CM

## 2012-07-23 DIAGNOSIS — Z5112 Encounter for antineoplastic immunotherapy: Secondary | ICD-10-CM

## 2012-07-23 LAB — CBC WITH DIFFERENTIAL/PLATELET
Basophils Absolute: 0 10*3/uL (ref 0.0–0.1)
Eosinophils Absolute: 0.1 10*3/uL (ref 0.0–0.5)
HCT: 30.1 % — ABNORMAL LOW (ref 38.4–49.9)
LYMPH%: 29.6 % (ref 14.0–49.0)
MONO#: 0.2 10*3/uL (ref 0.1–0.9)
NEUT#: 1.3 10*3/uL — ABNORMAL LOW (ref 1.5–6.5)
NEUT%: 57.3 % (ref 39.0–75.0)
Platelets: 140 10*3/uL (ref 140–400)
WBC: 2.3 10*3/uL — ABNORMAL LOW (ref 4.0–10.3)

## 2012-07-23 LAB — BASIC METABOLIC PANEL (CC13)
BUN: 12 mg/dL (ref 7.0–26.0)
CO2: 29 mEq/L (ref 22–29)
Calcium: 9.7 mg/dL (ref 8.4–10.4)
Creatinine: 0.6 mg/dL — ABNORMAL LOW (ref 0.7–1.3)
Glucose: 86 mg/dl (ref 70–99)

## 2012-07-23 MED ORDER — DIPHENHYDRAMINE HCL 50 MG/ML IJ SOLN
50.0000 mg | Freq: Once | INTRAMUSCULAR | Status: AC
  Administered 2012-07-23: 50 mg via INTRAVENOUS

## 2012-07-23 MED ORDER — DEXAMETHASONE SODIUM PHOSPHATE 4 MG/ML IJ SOLN
20.0000 mg | Freq: Once | INTRAMUSCULAR | Status: AC
  Administered 2012-07-23: 20 mg via INTRAVENOUS

## 2012-07-23 MED ORDER — FAMOTIDINE IN NACL 20-0.9 MG/50ML-% IV SOLN
20.0000 mg | Freq: Once | INTRAVENOUS | Status: AC
  Administered 2012-07-23: 20 mg via INTRAVENOUS

## 2012-07-23 MED ORDER — ONDANSETRON 16 MG/50ML IVPB (CHCC)
16.0000 mg | Freq: Once | INTRAVENOUS | Status: AC
  Administered 2012-07-23: 16 mg via INTRAVENOUS

## 2012-07-23 MED ORDER — SODIUM CHLORIDE 0.9 % IV SOLN
Freq: Once | INTRAVENOUS | Status: AC
  Administered 2012-07-23: 14:00:00 via INTRAVENOUS

## 2012-07-23 MED ORDER — PACLITAXEL CHEMO INJECTION 300 MG/50ML
48.0000 mg/m2 | Freq: Once | INTRAVENOUS | Status: AC
  Administered 2012-07-23: 90 mg via INTRAVENOUS
  Filled 2012-07-23: qty 15

## 2012-07-23 MED ORDER — SODIUM CHLORIDE 0.9 % IV SOLN
180.0000 mg | Freq: Once | INTRAVENOUS | Status: AC
  Administered 2012-07-23: 180 mg via INTRAVENOUS
  Filled 2012-07-23: qty 18

## 2012-07-23 MED ORDER — CETUXIMAB CHEMO IV INJECTION 200 MG/100ML
300.0000 mg | Freq: Once | INTRAVENOUS | Status: AC
  Administered 2012-07-23: 300 mg via INTRAVENOUS
  Filled 2012-07-23: qty 150

## 2012-07-23 NOTE — Progress Notes (Signed)
Okay to treat today, despite labs, per Dr. Gaylyn Rong.

## 2012-07-23 NOTE — Progress Notes (Signed)
Simulation verification note: The patient was similar to verification today in the management of his metastatic squamous cell carcinoma of the tongue to bone. His isocenter is in good position and the target is in alignment. Multileaf collimators are in proper position.

## 2012-07-23 NOTE — Patient Instructions (Addendum)
[facility] Discharge Instructions for Patients Receiving Chemotherapy  Today you received the following chemotherapy agents Erbitux/Taxol/Carboplatin To help prevent nausea and vomiting after your treatment, we encourage you to take your nausea medication as prescribed.   If you develop nausea and vomiting that is not controlled by your nausea medication, call the clinic. If it is after clinic hours your family physician or the after hours number for the clinic or go to the Emergency Department.   BELOW ARE SYMPTOMS THAT SHOULD BE REPORTED IMMEDIATELY:  *FEVER GREATER THAN 100.5 F  *CHILLS WITH OR WITHOUT FEVER  NAUSEA AND VOMITING THAT IS NOT CONTROLLED WITH YOUR NAUSEA MEDICATION  *UNUSUAL SHORTNESS OF BREATH  *UNUSUAL BRUISING OR BLEEDING  TENDERNESS IN MOUTH AND THROAT WITH OR WITHOUT PRESENCE OF ULCERS  *URINARY PROBLEMS  *BOWEL PROBLEMS  UNUSUAL RASH Items with * indicate a potential emergency and should be followed up as soon as possible.  One of the nurses will contact you 24 hours after your treatment. Please let the nurse know about any problems that you may have experienced. Feel free to call the clinic you have any questions or concerns. The clinic phone number is [phone].   I have been informed and understand all the instructions given to me. I know to contact the clinic, my physician, or go to the Emergency Department if any problems should occur. I do not have any questions at this time, but understand that I may call the clinic during office hours   should I have any questions or need assistance in obtaining follow up care.    __________________________________________  _____________  __________ Signature of Patient or Authorized Representative            Date                   Time    __________________________________________ Nurse's Signature    

## 2012-07-24 ENCOUNTER — Ambulatory Visit
Admission: RE | Admit: 2012-07-24 | Discharge: 2012-07-24 | Disposition: A | Discharge status: Home / Self Care | Payer: Medicaid Other | Source: Ambulatory Visit | Attending: Radiation Oncology | Admitting: Radiation Oncology

## 2012-07-25 ENCOUNTER — Ambulatory Visit
Admission: RE | Admit: 2012-07-25 | Discharge: 2012-07-25 | Disposition: A | Discharge status: Home / Self Care | Payer: Medicaid Other | Source: Ambulatory Visit | Attending: Radiation Oncology | Admitting: Radiation Oncology

## 2012-07-26 ENCOUNTER — Ambulatory Visit
Admission: RE | Admit: 2012-07-26 | Discharge: 2012-07-26 | Disposition: A | Discharge status: Home / Self Care | Payer: Medicaid Other | Source: Ambulatory Visit | Attending: Radiation Oncology | Admitting: Radiation Oncology

## 2012-07-26 ENCOUNTER — Encounter: Payer: Self-pay | Admitting: Radiation Oncology

## 2012-07-26 VITALS — BP 104/58 | HR 66 | Temp 97.5°F | Wt 141.2 lb

## 2012-07-26 DIAGNOSIS — C7951 Secondary malignant neoplasm of bone: Secondary | ICD-10-CM

## 2012-07-26 NOTE — Progress Notes (Signed)
Patient completed 3 fractions of radiation to left ilium.Doing well.Pain level "4" takes hydrocodone for pain.Reviewed possible side effects.Givne appointment card to schedule one month follow up with Dr.Murray.

## 2012-07-26 NOTE — Progress Notes (Signed)
[facility]    Radiation Oncology [address] [place]     Maryln Gottron, M.D. [place], [place] [address]               Billie Lade, M.D., Ph.D. Phone: [phone]      Molli Hazard A. Kathrynn Running, M.D. Fax: [phone]      Radene Gunning, M.D., Ph.D.         Lurline Hare, M.D.         Grayland Jack, M.D Weekly Treatment Management Note  Name: Thomas Bean     MRN: [id number]        CSN: [id number] Date: 07/26/2012      DOB: 1961/02/17  CC: [name], MD         [place]    Status: Outpatient  Diagnosis: The encounter diagnosis was Bone metastasis.  Current Dose: 18.0 Gy  Current Fraction: 3/3  Planned Dose: 18.0 Gy  Narrative: [name] was seen today for weekly treatment management. The chart was checked and CBCT  were reviewed. He has tolerated his radiation therapy well.  He has noticed some mild loose bowels but no actual diarrhea. He has not noticed any change in his pain thus far.  Codeine phosphate and Tape-allergies  Current Outpatient Prescriptions  Medication Sig Dispense Refill  . clindamycin (CLINDAGEL) 1 % gel Apply topically as needed. Apply to facial and chest skin rash.      . clobetasol cream (TEMOVATE) 0.05 % Apply topically as needed. Apply to rash BID      . gabapentin (NEURONTIN) 300 MG capsule Take 1 capsule (300 mg total) by mouth 3 (three) times daily.  90 capsule  3  . HYDROcodone-acetaminophen (NORCO) 5-325 MG per tablet Take 1 tablet by mouth every 6 (six) hours as needed for pain.  120 tablet  0  . hydrocortisone cream 0.5 % Apply topically 2 (two) times daily as needed. Apply to axilla rash twice daily as needed.      Marland Kitchen LORazepam (ATIVAN) 0.5 MG tablet TAKE 1 TABLET BY MOUTH EVERY 6 HOURS AS NEEDED FOR NAUSEA  30 tablet  0  . omeprazole (PRILOSEC) 10 MG capsule Take 10 mg by mouth daily.      . ondansetron (ZOFRAN) 8 MG tablet Take 8 mg by mouth every 12 (twelve) hours as needed.      . prochlorperazine  (COMPAZINE) 10 MG tablet Take 10 mg by mouth every 6 (six) hours as needed.      . prochlorperazine (COMPAZINE) 10 MG tablet TAKE 1 TABLET BY MOUTH EVERY 6 HOURS AS NEEDED(NAUSEA OR VOMITING)  30 tablet  0  . sodium fluoride (PREVIDENT 5000 PLUS) 1.1 % CREA dental cream Apply thin ribbon of cream to tooth brush. Brush teeth for 2 minutes. Spit out excess-DO NOT swallow. DO NOT rinse afterwards. Repeat nightly.  1 Tube  prn   Labs:  Lab Results  Component Value Date   WBC 2.3* 07/23/2012   HGB 9.5* 07/23/2012   HCT 30.1* 07/23/2012   MCV 99.7* 07/23/2012   PLT 140 07/23/2012   Lab Results  Component Value Date   CREATININE 0.6* 07/23/2012   BUN 12.0 07/23/2012   NA 142 07/23/2012   K 3.7 07/23/2012   CL 106 07/23/2012   CO2 29 07/23/2012   Lab Results  Component Value Date   ALT 10 07/16/2012   AST 13 07/16/2012   BILITOT 0.42 07/16/2012    Physical Examination:  weight is  141 lb 3.2 oz (64.048 kg). His temperature is 97.5 F (36.4 C). His blood pressure is 104/58 and his pulse is 66.    Wt Readings from Last 3 Encounters:  07/26/12 141 lb 3.2 oz (64.048 kg)  07/17/12 144 lb 9.6 oz (65.59 kg)  07/09/12 146 lb 3.2 oz (66.316 kg)    Surgical changes and reconstruction noted in the left face area Lungs - Normal respiratory effort, chest expands symmetrically. Lungs are clear to auscultation, no crackles or wheezes.  Heart has regular rhythm and rate  Abdomen is soft and non tender with normal bowel sounds  Assessment:  Patient tolerated treatments well  Plan: Routine followup in one month with Dr. Maryln Gottron.

## 2012-07-27 ENCOUNTER — Other Ambulatory Visit: Payer: Self-pay | Admitting: *Deleted

## 2012-07-27 ENCOUNTER — Other Ambulatory Visit: Payer: Self-pay | Admitting: Oncology

## 2012-07-27 ENCOUNTER — Ambulatory Visit: Payer: Medicaid Other

## 2012-07-27 DIAGNOSIS — K1230 Oral mucositis (ulcerative), unspecified: Secondary | ICD-10-CM

## 2012-07-27 MED ORDER — HYDROCODONE-ACETAMINOPHEN 5-325 MG PO TABS: 1.0000 | ORAL_TABLET | Freq: Four times a day (QID) | ORAL | Status: DC | PRN

## 2012-07-27 NOTE — Telephone Encounter (Signed)
Called pt to notify of refill hydrocodone called into [facility].  He verbalized understanding.

## 2012-07-30 ENCOUNTER — Ambulatory Visit: Payer: Self-pay | Admitting: Oncology

## 2012-07-30 ENCOUNTER — Encounter: Payer: Self-pay | Admitting: Radiation Oncology

## 2012-07-30 ENCOUNTER — Ambulatory Visit: Payer: Self-pay

## 2012-07-30 ENCOUNTER — Other Ambulatory Visit: Payer: Self-pay | Admitting: Lab

## 2012-07-30 ENCOUNTER — Ambulatory Visit: Payer: Medicaid Other

## 2012-07-30 NOTE — Addendum Note (Signed)
Encounter addended by: Jestine Bicknell Bruner Lean Fayson, RN on: 07/30/2012  1:48 PM<BR>     Documentation filed: Charges VN

## 2012-07-30 NOTE — Progress Notes (Signed)
Simulation/treatment planning note: On 07/17/2012 [name] was taken to the CT simulator. He was placed supine. A Vac Loc immobilization device  was constructed. His pelvis was scanned. The CT data set was sent to the [facility] planning software. I contoured in his tumor along the left ilium. He was set up to left lateral, AP, and PA fields. 3 separate multileaf collimators were designed. A total of for complex treatment devices were utilized. I prescribed dose of 1800 cGy in 3 sessions. I requesting daily cone beam CT for image guidance because of the hyperfractionated course of treatment.

## 2012-07-30 NOTE — Progress Notes (Signed)
[facility] Radiation Oncology End of Treatment Note  Name:Thomas Bean  Date: 07/30/2012 NWG:[id number] DOB:[birth year]   Status:outpatient    CC: [name], MD  Dr. Jethro Bolus  REFERRING PHYSICIAN:  Dr. 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 Daily cone beam CT for image guidance               NARRATIVE:   The patient tolerated treatment well with no toxicity related to his therapy.                         PLAN: Routine followup in one month. Patient instructed to call if questions or worsening complaints in interim.

## 2012-07-31 ENCOUNTER — Ambulatory Visit: Payer: Medicaid Other

## 2012-08-01 ENCOUNTER — Ambulatory Visit: Payer: Medicaid Other

## 2012-08-02 ENCOUNTER — Ambulatory Visit: Payer: Medicaid Other

## 2012-08-03 ENCOUNTER — Ambulatory Visit: Payer: Medicaid Other

## 2012-08-06 ENCOUNTER — Ambulatory Visit (HOSPITAL_BASED_OUTPATIENT_CLINIC_OR_DEPARTMENT_OTHER): Payer: Medicaid Other | Admitting: Oncology

## 2012-08-06 ENCOUNTER — Ambulatory Visit (HOSPITAL_BASED_OUTPATIENT_CLINIC_OR_DEPARTMENT_OTHER): Payer: Medicaid Other

## 2012-08-06 ENCOUNTER — Telehealth: Payer: Self-pay | Admitting: Oncology

## 2012-08-06 ENCOUNTER — Encounter: Payer: Self-pay | Admitting: Oncology

## 2012-08-06 ENCOUNTER — Other Ambulatory Visit (HOSPITAL_BASED_OUTPATIENT_CLINIC_OR_DEPARTMENT_OTHER): Payer: Medicaid Other

## 2012-08-06 ENCOUNTER — Ambulatory Visit: Payer: Medicaid Other

## 2012-08-06 ENCOUNTER — Other Ambulatory Visit: Payer: Self-pay | Admitting: Lab

## 2012-08-06 VITALS — BP 118/73 | HR 80 | Temp 97.3°F | Resp 18 | Ht 72.0 in | Wt 139.8 lb

## 2012-08-06 DIAGNOSIS — C760 Malignant neoplasm of head, face and neck: Secondary | ICD-10-CM

## 2012-08-06 DIAGNOSIS — C029 Malignant neoplasm of tongue, unspecified: Secondary | ICD-10-CM

## 2012-08-06 DIAGNOSIS — C7951 Secondary malignant neoplasm of bone: Secondary | ICD-10-CM

## 2012-08-06 DIAGNOSIS — C01 Malignant neoplasm of base of tongue: Secondary | ICD-10-CM

## 2012-08-06 DIAGNOSIS — Z5112 Encounter for antineoplastic immunotherapy: Secondary | ICD-10-CM

## 2012-08-06 DIAGNOSIS — E46 Unspecified protein-calorie malnutrition: Secondary | ICD-10-CM

## 2012-08-06 DIAGNOSIS — D638 Anemia in other chronic diseases classified elsewhere: Secondary | ICD-10-CM

## 2012-08-06 LAB — BASIC METABOLIC PANEL (CC13)
CO2: 29 mEq/L (ref 22–29)
Chloride: 106 mEq/L (ref 98–107)
Glucose: 79 mg/dl (ref 70–99)
Potassium: 3.8 mEq/L (ref 3.5–5.1)
Sodium: 140 mEq/L (ref 136–145)

## 2012-08-06 LAB — CBC WITH DIFFERENTIAL/PLATELET
Basophils Absolute: 0 10*3/uL (ref 0.0–0.1)
Eosinophils Absolute: 0 10*3/uL (ref 0.0–0.5)
HCT: 31.7 % — ABNORMAL LOW (ref 38.4–49.9)
MCV: 101.9 fL — ABNORMAL HIGH (ref 79.3–98.0)
MONO#: 0.3 10*3/uL (ref 0.1–0.9)
MONO%: 12.6 % (ref 0.0–14.0)
Platelets: 138 10*3/uL — ABNORMAL LOW (ref 140–400)
RBC: 3.11 10*6/uL — ABNORMAL LOW (ref 4.20–5.82)
RDW: 17.4 % — ABNORMAL HIGH (ref 11.0–14.6)
nRBC: 0 % (ref 0–0)

## 2012-08-06 MED ORDER — ONDANSETRON 16 MG/50ML IVPB (CHCC)
16.0000 mg | Freq: Once | INTRAVENOUS | Status: AC
  Administered 2012-08-06: 16 mg via INTRAVENOUS

## 2012-08-06 MED ORDER — FAMOTIDINE IN NACL 20-0.9 MG/50ML-% IV SOLN
20.0000 mg | Freq: Once | INTRAVENOUS | Status: AC
  Administered 2012-08-06: 20 mg via INTRAVENOUS

## 2012-08-06 MED ORDER — CARBOPLATIN CHEMO INJECTION 450 MG/45ML
180.0000 mg | Freq: Once | INTRAVENOUS | Status: AC
  Administered 2012-08-06: 180 mg via INTRAVENOUS
  Filled 2012-08-06: qty 18

## 2012-08-06 MED ORDER — DIPHENHYDRAMINE HCL 50 MG/ML IJ SOLN
50.0000 mg | Freq: Once | INTRAMUSCULAR | Status: AC
  Administered 2012-08-06: 50 mg via INTRAVENOUS

## 2012-08-06 MED ORDER — PACLITAXEL CHEMO INJECTION 300 MG/50ML
48.0000 mg/m2 | Freq: Once | INTRAVENOUS | Status: AC
  Administered 2012-08-06: 90 mg via INTRAVENOUS
  Filled 2012-08-06: qty 15

## 2012-08-06 MED ORDER — CETUXIMAB CHEMO IV INJECTION 200 MG/100ML
187.5000 mg/m2 | Freq: Once | INTRAVENOUS | Status: AC
  Administered 2012-08-06: 300 mg via INTRAVENOUS
  Filled 2012-08-06: qty 150

## 2012-08-06 MED ORDER — DEXAMETHASONE SODIUM PHOSPHATE 4 MG/ML IJ SOLN
20.0000 mg | Freq: Once | INTRAMUSCULAR | Status: AC
  Administered 2012-08-06: 20 mg via INTRAVENOUS

## 2012-08-06 MED ORDER — SODIUM CHLORIDE 0.9 % IV SOLN
Freq: Once | INTRAVENOUS | Status: AC
  Administered 2012-08-06: 14:00:00 via INTRAVENOUS

## 2012-08-06 NOTE — Progress Notes (Signed)
CBC reviewed by Clenton Pare, PA: OK Treat with chemo despite ANC 1.1 WBC 2.0.

## 2012-08-06 NOTE — Progress Notes (Signed)
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 He now developed biopsy proven metastatic disease in 03/2012 with met in right neck, left lung mass and left illac.   PAST THERAPY:  1. 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 He developed recurrence in the left submandibular gland in August 2012. He underwent on 07/27/2011 radical left neck dissection by Dr. Ezzard Standing and Dr. Geryl Rankins. Pathology case number [id number] showed total 15 lymph nodes from left radical neck dissection that was all negative. However the left submandibular gland mass showed a 3.5 cm invasive SCC, moderately differentiates involving the adjacent skeletal muscle tissue and bone a tissue and extending into the inked margin. There was no evidence of any lymphatic invasion or perineural invasion. He underwent further resection by Dr. Mary Sella at [facility] on 09/29/2011.  2. He received adjuvant chemoradiation therapy with q 3 week Cisplatin given 11/15/11-12/05/11 (received 2 of 3 doses; 3rd dose held due to febrile neutropenia and development of flap abscess). He received concurrent XRT 11/15/11 through 01/05/12.  3. For right neck recurrence in 03/2012; he underwent palliative right neck dissection by Dr. Manson Passey at [facility].   CURRENT THERAPY: started on 05/15/2012 palliative chemo weekly Carboplatin/Taxol/Erbitux; 3 weeks on; 1 week off. Schedule was changed to 2 weeks on and 1 week off due to neutropenia.  INTERVAL HISTORY: Thomas Bean 51 y.o. male returns for regular follow up by himself.  He completed a short course or XRT to his left iliac bone met. Pain is now improved. He has a mild rash to his face and chest, but significantly improved from prior. He is  still independent of activities of daily living.  However, he does not leave the house much.  He has fibrosis of the right neck from recent neck dissection but with mild pain.  He does not feel any discreet mass.  He denied cough, chest pain, SOB, palpitation, fever.  He continues to use PEG tube for feeding.  His weight is relatively stable.   Patient denies headache, visual changes, confusion, drenching night sweats, palpable lymph node swelling, mucositis, odynophagia, dysphagia, nausea vomiting, jaundice, chest pain, palpitation, shortness of breath, dyspnea on exertion, productive cough, gum bleeding, epistaxis, hematemesis, hemoptysis, abdominal pain, abdominal swelling, early satiety, melena, hematochezia, hematuria, skin rash, spontaneous bleeding, joint swelling, joint pain, heat or cold intolerance, bowel bladder incontinence, back pain, focal motor weakness, paresthesia, depression.    Past Medical History  Diagnosis Date  . Hemorrhoids   . PONV (postoperative nausea and vomiting)   . Hypertension     no medications at present  . GERD (gastroesophageal reflux disease)     no medication  . Depression     treated approx 12 years ago, no treatment at this time  . Hiatal hernia   . Neutropenia 12/23/2011  . Abscess 12/27/2011  . Hx of radiation therapy 11/15/11 -01/05/12    left lateral tongue  . Lesion of left lung 03/14/12    CT chest, [facility], squamous cell carcinoma  . Facial rash 05/24/2012  . Cancer 12/2010    left side of tongue T1 lesion excised 4/12  . Lung metastasis dx'd 03/2012  . Bone metastasis 04/25/12    PET scan-left iliac bone    Past Surgical  History  Procedure Date  . Inguinal herniorrhapy   . Tonsillectomy and adenoidectomy   . Excision of tongue mass     April 2012  . Hernia repair   . Neck mass excision 07/27/2011  . Radical neck dissection 07/27/2011    Procedure: RADICAL NECK DISSECTION;  Surgeon: Drema Halon, MD;  Location: [facility] OR;  Service: ENT;   Laterality: Left;  Marland Kitchen Mandibulectomy 09/2011    and scapular free flap  . Modified radical neck dissection 03/27/12    excision of lip lesion, lip benign, right neck squamous cell ca    Current Outpatient Prescriptions  Medication Sig Dispense Refill  . clindamycin (CLINDAGEL) 1 % gel Apply topically as needed. Apply to facial and chest skin rash.      . clobetasol cream (TEMOVATE) 0.05 % Apply topically as needed. Apply to rash BID      . gabapentin (NEURONTIN) 300 MG capsule Take 1 capsule (300 mg total) by mouth 3 (three) times daily.  90 capsule  3  . HYDROcodone-acetaminophen (NORCO/VICODIN) 5-325 MG per tablet Take 1 tablet by mouth every 6 (six) hours as needed for pain.  120 tablet  0  . hydrocortisone cream 0.5 % Apply topically 2 (two) times daily as needed. Apply to axilla rash twice daily as needed.      Marland Kitchen LORazepam (ATIVAN) 0.5 MG tablet TAKE 1 TABLET BY MOUTH EVERY 6 HOURS AS NEEDED FOR NAUSEA  30 tablet  0  . omeprazole (PRILOSEC) 10 MG capsule Take 10 mg by mouth daily.      . ondansetron (ZOFRAN) 8 MG tablet Take 8 mg by mouth every 12 (twelve) hours as needed.      . prochlorperazine (COMPAZINE) 10 MG tablet Take 10 mg by mouth every 6 (six) hours as needed.      . prochlorperazine (COMPAZINE) 10 MG tablet TAKE 1 TABLET BY MOUTH EVERY 6 HOURS AS NEEDED(NAUSEA OR VOMITING)  30 tablet  0  . sodium fluoride (PREVIDENT 5000 PLUS) 1.1 % CREA dental cream Apply thin ribbon of cream to tooth brush. Brush teeth for 2 minutes. Spit out excess-DO NOT swallow. DO NOT rinse afterwards. Repeat nightly.  1 Tube  prn   No current facility-administered medications for this visit.   Facility-Administered Medications Ordered in Other Visits  Medication Dose Route Frequency Provider Last Rate Last Dose  . [COMPLETED] 0.9 %  sodium chloride infusion   Intravenous Once Exie Parody, MD 20 mL/hr at 08/06/12 1340    . CARBOplatin (PARAPLATIN) 180 mg in sodium chloride 0.9 % 100 mL chemo infusion  180  mg Intravenous Once Exie Parody, MD      . cetuximab (ERBITUX) chemo infusion 300 mg  187.5 mg/m2 (Treatment Plan Actual) Intravenous Once Exie Parody, MD      . [address] dexamethasone (DECADRON) injection 20 mg  20 mg Intravenous Once Exie Parody, MD   20 mg at 08/06/12 1341  . [COMPLETED] diphenhydrAMINE (BENADRYL) injection 50 mg  50 mg Intravenous Once Exie Parody, MD   50 mg at 08/06/12 1341  . famotidine (PEPCID) IVPB 20 mg  20 mg Intravenous Once Exie Parody, MD      . [COMPLETED] ondansetron (ZOFRAN) IVPB 16 mg  16 mg Intravenous Once Exie Parody, MD   16 mg at 08/06/12 1341  . PACLitaxel (TAXOL) 90 mg in dextrose 5 % 250 mL chemo infusion (</= 80mg /m2)  48 mg/m2 (Treatment Plan Actual) Intravenous Once Huan T  Gaylyn Rong, MD        ALLERGIES:  is allergic to codeine phosphate and tape.  REVIEW OF SYSTEMS:  The rest of the 14-point review of system was negative.   Filed Vitals:   08/06/12 1129  BP: 118/73  Pulse: 80  Temp: 97.3 F (36.3 C)  Resp: 18   Wt Readings from Last 3 Encounters:  08/06/12 139 lb 12.8 oz (63.413 kg)  07/26/12 141 lb 3.2 oz (64.048 kg)  07/17/12 144 lb 9.6 oz (65.59 kg)   ECOG Performance status: 1  PHYSICAL EXAMINATION:   General: thin-appearing man, in no acute distress. Eyes: no scleral icterus. ENT: There were no oropharyngeal lesions on my unaided exam. Neck was without thyromegaly. There was a flap in the left jaw which has healed. Right neck was fibrotic from resection. There was no erythema, purulent discharge, pain on palpation. Lymphatics: no clear palpable adenopathy. Respiratory: lungs were clear bilaterally without wheezing or crackles. Cardiovascular: Regular rate and rhythm, S1/S2, without murmur, rub or gallop. There was no pedal edema. GI: abdomen was soft, flat, nontender, nondistended, without organomegaly. PEG tube was dry, clean, intact. Muscoloskeletal: no spinal tenderness of palpation of vertebral spine. Skin exam was without echymosis, petichae.  There was mild aciniform skin rash on the face, trunk, and scalp. There was dry, dark skin in the bilateral axilla where the skin rash used to be.  Neuro exam was nonfocal. Patient was able to get on and off exam table without assistance. Gait was normal. Patient was alerted and oriented. Attention was good. Language was appropriate. Mood was normal without depression. Speech was not pressured. Thought content was not tangential.    LABORATORY/RADIOLOGY DATA:  Lab Results  Component Value Date   WBC 2.0* 08/06/2012   HGB 10.2* 08/06/2012   HCT 31.7* 08/06/2012   PLT 138* 08/06/2012   GLUCOSE 86 07/23/2012   CHOL 160 02/14/2008   TRIG 442* 02/14/2008   HDL 26.5* 02/14/2008   LDLDIRECT 64.8 02/14/2008   ALKPHOS 47 07/16/2012   ALT 10 07/16/2012   AST 13 07/16/2012   NA 142 07/23/2012   K 3.7 07/23/2012   CL 106 07/23/2012   CREATININE 0.6* 07/23/2012   BUN 12.0 07/23/2012   CO2 29 07/23/2012    ASSESSMENT AND PLAN:   1. Smoking: none at this time.   2. Recurrent left lateral tongue squamous cell carcinoma: He now has metastatic disease to the lung and bone.  This last CT scan showed mixed response with smaller lung lesion with with slight enlargement of the left iliac bone lesion (which could be due to sclerotic response).  - As he has having benefit from chemo (without obvious disease progression), I recommended him to continue chemo Carboplatin/Taxol/Erbitux. If ANC is <1.1 next week, then will hold his dose. - He developed grade 3 skin rash in bilateral axilla, grade 2 neutropenia, grade 1 anemia despite previous dose reduction.  Chemo was further reduced to  Carbplatin/Taxol further to 60% and continue Erbitux at 75%.  Chemo is now 2 weeks of, 1 week off.   - A repeat staging CT is due in mid/late Jan 2014 but sooner if he has concerning symptoms.  If and when he has disease progression, we may referring him to [facility] or [facility] for potential clinical trial.   3.  Bone met: S/P XRT to his  left iliac. Pain is better. He will continue with Zometa every 6 weeks to decrease risk of skeletal-related event.   4.  Gas within mediastinum:  Unclear etiology.  He is not symptomatic without chest pain, fever, cough, pleurisy.  He has not had recent operation.  Given metastatic disease, and asymptomatic state, I do not think that surgical intervention is indicated.  He did not want referral to CVTS yet.   5. Anemia: This is from anemia of chronic disease. There is no active bleeding. No transfusion indicated.   6. Calorie/Protein malnutrition: stable weight. 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 He will see Vernell Leep today to see if he can increase feeding solution.   7. Mouth pain from treatment: He is on Vicodin prn.   8. Skin rash: Due to Erbitux.  He is on Cleocin cream and Hydrocortisone cream prn.   9.  Follow up: once per cycle.      The length of time of the face-to-face encounter was 25 minutes. More than 50% of time was spent counseling and coordination of care.

## 2012-08-06 NOTE — Patient Instructions (Addendum)
[facility] Discharge Instructions for Patients Receiving Chemotherapy  Today you received the following chemotherapy agents Erbitux/Taxol/Carbo.  To help prevent nausea and vomiting after your treatment, we encourage you to take your nausea medication as directed.    If you develop nausea and vomiting that is not controlled by your nausea medication, call the clinic. If it is after clinic hours your family physician or the after hours number for the clinic or go to the Emergency Department.   BELOW ARE SYMPTOMS THAT SHOULD BE REPORTED IMMEDIATELY:  *FEVER GREATER THAN 100.5 F  *CHILLS WITH OR WITHOUT FEVER  NAUSEA AND VOMITING THAT IS NOT CONTROLLED WITH YOUR NAUSEA MEDICATION  *UNUSUAL SHORTNESS OF BREATH  *UNUSUAL BRUISING OR BLEEDING  TENDERNESS IN MOUTH AND THROAT WITH OR WITHOUT PRESENCE OF ULCERS  *URINARY PROBLEMS  *BOWEL PROBLEMS  UNUSUAL RASH Items with * indicate a potential emergency and should be followed up as soon as possible.  Feel free to call the clinic you have any questions or concerns. The clinic phone number is [phone].

## 2012-08-06 NOTE — Telephone Encounter (Signed)
gv and printed pt appt schedule for DEC

## 2012-08-07 ENCOUNTER — Encounter: Payer: Self-pay | Admitting: Oncology

## 2012-08-07 ENCOUNTER — Ambulatory Visit: Payer: Medicaid Other

## 2012-08-07 ENCOUNTER — Other Ambulatory Visit: Payer: Self-pay | Admitting: Certified Registered Nurse Anesthetist

## 2012-08-08 ENCOUNTER — Ambulatory Visit: Payer: Medicaid Other

## 2012-08-13 ENCOUNTER — Ambulatory Visit: Payer: Medicaid Other

## 2012-08-13 ENCOUNTER — Ambulatory Visit: Payer: Medicaid Other | Admitting: Nutrition

## 2012-08-13 ENCOUNTER — Ambulatory Visit (HOSPITAL_BASED_OUTPATIENT_CLINIC_OR_DEPARTMENT_OTHER): Payer: Medicaid Other

## 2012-08-13 ENCOUNTER — Other Ambulatory Visit: Payer: Self-pay | Admitting: Oncology

## 2012-08-13 ENCOUNTER — Other Ambulatory Visit (HOSPITAL_BASED_OUTPATIENT_CLINIC_OR_DEPARTMENT_OTHER): Payer: Medicaid Other | Admitting: Lab

## 2012-08-13 VITALS — BP 102/65 | HR 76 | Temp 98.4°F

## 2012-08-13 DIAGNOSIS — L270 Generalized skin eruption due to drugs and medicaments taken internally: Secondary | ICD-10-CM

## 2012-08-13 DIAGNOSIS — C01 Malignant neoplasm of base of tongue: Secondary | ICD-10-CM

## 2012-08-13 DIAGNOSIS — C7951 Secondary malignant neoplasm of bone: Secondary | ICD-10-CM

## 2012-08-13 DIAGNOSIS — C760 Malignant neoplasm of head, face and neck: Secondary | ICD-10-CM

## 2012-08-13 DIAGNOSIS — C770 Secondary and unspecified malignant neoplasm of lymph nodes of head, face and neck: Secondary | ICD-10-CM

## 2012-08-13 DIAGNOSIS — C029 Malignant neoplasm of tongue, unspecified: Secondary | ICD-10-CM

## 2012-08-13 DIAGNOSIS — Z5112 Encounter for antineoplastic immunotherapy: Secondary | ICD-10-CM

## 2012-08-13 LAB — COMPREHENSIVE METABOLIC PANEL (CC13)
BUN: 14 mg/dL (ref 7.0–26.0)
CO2: 25 mEq/L (ref 22–29)
Calcium: 9.5 mg/dL (ref 8.4–10.4)
Chloride: 104 mEq/L (ref 98–107)
Creatinine: 0.6 mg/dL — ABNORMAL LOW (ref 0.7–1.3)
Glucose: 86 mg/dl (ref 70–99)

## 2012-08-13 LAB — CBC WITH DIFFERENTIAL/PLATELET
Eosinophils Absolute: 0.1 10*3/uL (ref 0.0–0.5)
HCT: 32.5 % — ABNORMAL LOW (ref 38.4–49.9)
LYMPH%: 27.8 % (ref 14.0–49.0)
MONO#: 0.2 10*3/uL (ref 0.1–0.9)
NEUT#: 1.2 10*3/uL — ABNORMAL LOW (ref 1.5–6.5)
NEUT%: 58.5 % (ref 39.0–75.0)
Platelets: 125 10*3/uL — ABNORMAL LOW (ref 140–400)
WBC: 2.1 10*3/uL — ABNORMAL LOW (ref 4.0–10.3)

## 2012-08-13 MED ORDER — FAMOTIDINE IN NACL 20-0.9 MG/50ML-% IV SOLN
20.0000 mg | Freq: Once | INTRAVENOUS | Status: AC
  Administered 2012-08-13: 20 mg via INTRAVENOUS

## 2012-08-13 MED ORDER — SODIUM CHLORIDE 0.9 % IV SOLN
180.0000 mg | Freq: Once | INTRAVENOUS | Status: AC
  Administered 2012-08-13: 180 mg via INTRAVENOUS
  Filled 2012-08-13: qty 18

## 2012-08-13 MED ORDER — DEXAMETHASONE SODIUM PHOSPHATE 4 MG/ML IJ SOLN
20.0000 mg | Freq: Once | INTRAMUSCULAR | Status: AC
  Administered 2012-08-13: 20 mg via INTRAVENOUS

## 2012-08-13 MED ORDER — SODIUM CHLORIDE 0.9 % IV SOLN
Freq: Once | INTRAVENOUS | Status: AC
  Administered 2012-08-13: 13:00:00 via INTRAVENOUS

## 2012-08-13 MED ORDER — DIPHENHYDRAMINE HCL 50 MG/ML IJ SOLN
50.0000 mg | Freq: Once | INTRAMUSCULAR | Status: AC
  Administered 2012-08-13: 50 mg via INTRAVENOUS

## 2012-08-13 MED ORDER — ZOLEDRONIC ACID 4 MG/100ML IV SOLN
4.0000 mg | Freq: Once | INTRAVENOUS | Status: AC
  Administered 2012-08-13: 4 mg via INTRAVENOUS
  Filled 2012-08-13: qty 100

## 2012-08-13 MED ORDER — ONDANSETRON 16 MG/50ML IVPB (CHCC)
16.0000 mg | Freq: Once | INTRAVENOUS | Status: AC
  Administered 2012-08-13: 16 mg via INTRAVENOUS

## 2012-08-13 MED ORDER — PACLITAXEL CHEMO INJECTION 300 MG/50ML
48.0000 mg/m2 | Freq: Once | INTRAVENOUS | Status: AC
  Administered 2012-08-13: 90 mg via INTRAVENOUS
  Filled 2012-08-13: qty 15

## 2012-08-13 MED ORDER — SODIUM CHLORIDE 0.9 % IV SOLN: Freq: Once | INTRAVENOUS | Status: DC

## 2012-08-13 MED ORDER — CETUXIMAB CHEMO IV INJECTION 200 MG/100ML
187.5000 mg/m2 | Freq: Once | INTRAVENOUS | Status: AC
  Administered 2012-08-13: 300 mg via INTRAVENOUS
  Filled 2012-08-13: qty 150

## 2012-08-13 MED ORDER — METHYLPREDNISOLONE SODIUM SUCC 40 MG IJ SOLR
40.0000 mg | Freq: Once | INTRAMUSCULAR | Status: AC
Start: 2012-08-13 — End: 2012-08-13
  Administered 2012-08-13: 40 mg via INTRAVENOUS

## 2012-08-13 NOTE — Progress Notes (Signed)
Patient reports that he has tolerated 5 cans of Osmolite 1.5 this past week. He is continuing to use 2 scoops of whey protein powder daily. His weight has decreased to 139.8 pounds November 25 from 144.6 pounds November 5. Patient continues to complain of heartburn. He denies other nutritional side effects.  Nutrition diagnosis: Inadequate oral intake continues. New nutrition diagnosis of inadequate enteral infusion related to patient's intolerance as evidenced by patient experiencing weight loss and less than adequate calories and protein.  Intervention: Patient was educated to increase Osmolite 1.5 to goal rate of 6 cans daily as tolerated. He is to continue whey protein powder as tolerated. I have educated him on a different strategy of administering tube feedings to help with heartburn. Patient is willing to try a slower infusion using a gravity drip.  Monitoring, evaluation, goals: Patient tolerates tube feeding however administered less than his goal rate past week. He has experienced weight loss. He will work to increase tube feedings back to goal rate for weight maintenance.  Next visit: Monday, December 16, during chemotherapy.

## 2012-08-13 NOTE — Patient Instructions (Addendum)
[facility] Discharge Instructions for Patients Receiving Chemotherapy  Today you received the following chemotherapy agents: taxol, carboplatin, erbitux, zometa  To help prevent nausea and vomiting after your treatment, we encourage you to take your nausea medication. Take it as often as prescribed.     If you develop nausea and vomiting that is not controlled by your nausea medication, call the clinic. If it is after clinic hours your family physician or the after hours number for the clinic or go to the Emergency Department.   BELOW ARE SYMPTOMS THAT SHOULD BE REPORTED IMMEDIATELY:  *FEVER GREATER THAN 100.5 F  *CHILLS WITH OR WITHOUT FEVER  NAUSEA AND VOMITING THAT IS NOT CONTROLLED WITH YOUR NAUSEA MEDICATION  *UNUSUAL SHORTNESS OF BREATH  *UNUSUAL BRUISING OR BLEEDING  TENDERNESS IN MOUTH AND THROAT WITH OR WITHOUT PRESENCE OF ULCERS  *URINARY PROBLEMS  *BOWEL PROBLEMS  UNUSUAL RASH Items with * indicate a potential emergency and should be followed up as soon as possible.  Feel free to call the clinic you have any questions or concerns. The clinic phone number is [phone].   I have been informed and understand all the instructions given to me. I know to contact the clinic, my physician, or go to the Emergency Department if any problems should occur. I do not have any questions at this time, but understand that I may call the clinic during office hours   should I have any questions or need assistance in obtaining follow up care.    __________________________________________  _____________  __________ Signature of Patient or Authorized Representative            Date                   Time    __________________________________________ Nurse's Signature

## 2012-08-13 NOTE — Progress Notes (Signed)
Patient went to bathroom, pushed up sleeves to wash his hands and noticed redness on his arm at and above IV site.  Notified Chrystie Nose as he was returning to his seat.  Patient said the taxol started burning at the beginning but he didn't worry about it because it had done that on other administrations with no problem.  Patients arm was red along IV site and had three red hard bumps app 1 cm each above IV site.  Notified Adella Hare.    Taxol was stopped.  Normal Saline infused.  MD notified.  Clenton Pare to the bedside.  Ordered 40 solumedrol, hydrocortisone cream, warm pack.  IV discontinued.  New IV placed left antecubital.  Taxol restarted at 1657.  Finished infusion without any problems.  Upon discharge, all symptoms of the local reaction had dissipated.

## 2012-08-14 ENCOUNTER — Telehealth: Payer: Self-pay | Admitting: Oncology

## 2012-08-14 ENCOUNTER — Ambulatory Visit: Payer: Medicaid Other

## 2012-08-14 ENCOUNTER — Other Ambulatory Visit: Payer: Self-pay | Admitting: Oncology

## 2012-08-14 DIAGNOSIS — C029 Malignant neoplasm of tongue, unspecified: Secondary | ICD-10-CM

## 2012-08-14 DIAGNOSIS — C770 Secondary and unspecified malignant neoplasm of lymph nodes of head, face and neck: Secondary | ICD-10-CM

## 2012-08-14 DIAGNOSIS — C801 Malignant (primary) neoplasm, unspecified: Secondary | ICD-10-CM

## 2012-08-14 DIAGNOSIS — C7952 Secondary malignant neoplasm of bone marrow: Secondary | ICD-10-CM

## 2012-08-14 NOTE — Telephone Encounter (Signed)
Called and s.w. Tina in IR to advise about PAC placement.Marland KitchenMarland KitchenInetta Fermo stated that she would call pt.

## 2012-08-15 ENCOUNTER — Ambulatory Visit: Payer: Medicaid Other

## 2012-08-15 ENCOUNTER — Telehealth: Payer: Self-pay | Admitting: *Deleted

## 2012-08-15 ENCOUNTER — Encounter: Payer: Self-pay | Admitting: Oncology

## 2012-08-15 NOTE — Telephone Encounter (Signed)
Called pt in response to email for refill on ativan.  Informed pt it was called in by Triage RN yesterday and to check w/ [facility] today.  Instructed to call back if any problems.

## 2012-08-16 ENCOUNTER — Other Ambulatory Visit: Payer: Self-pay | Admitting: Radiology

## 2012-08-16 ENCOUNTER — Ambulatory Visit: Payer: Medicaid Other

## 2012-08-17 ENCOUNTER — Ambulatory Visit: Payer: Medicaid Other

## 2012-08-20 ENCOUNTER — Ambulatory Visit: Payer: Medicaid Other

## 2012-08-20 ENCOUNTER — Other Ambulatory Visit: Payer: Self-pay | Admitting: Oncology

## 2012-08-20 ENCOUNTER — Other Ambulatory Visit: Payer: Self-pay | Admitting: Lab

## 2012-08-20 ENCOUNTER — Ambulatory Visit: Payer: Self-pay

## 2012-08-20 ENCOUNTER — Ambulatory Visit (HOSPITAL_COMMUNITY)
Admission: RE | Admit: 2012-08-20 | Discharge: 2012-08-20 | Disposition: A | Discharge status: Home / Self Care | Payer: Medicaid Other | Source: Ambulatory Visit | Attending: Oncology | Admitting: Oncology

## 2012-08-20 ENCOUNTER — Encounter (HOSPITAL_COMMUNITY): Payer: Self-pay

## 2012-08-20 ENCOUNTER — Other Ambulatory Visit: Payer: Self-pay | Admitting: *Deleted

## 2012-08-20 DIAGNOSIS — I10 Essential (primary) hypertension: Secondary | ICD-10-CM | POA: Insufficient documentation

## 2012-08-20 DIAGNOSIS — C7952 Secondary malignant neoplasm of bone marrow: Secondary | ICD-10-CM | POA: Insufficient documentation

## 2012-08-20 DIAGNOSIS — K219 Gastro-esophageal reflux disease without esophagitis: Secondary | ICD-10-CM | POA: Insufficient documentation

## 2012-08-20 DIAGNOSIS — C7951 Secondary malignant neoplasm of bone: Secondary | ICD-10-CM | POA: Insufficient documentation

## 2012-08-20 DIAGNOSIS — C029 Malignant neoplasm of tongue, unspecified: Secondary | ICD-10-CM | POA: Insufficient documentation

## 2012-08-20 DIAGNOSIS — C7800 Secondary malignant neoplasm of unspecified lung: Secondary | ICD-10-CM | POA: Insufficient documentation

## 2012-08-20 DIAGNOSIS — Z87891 Personal history of nicotine dependence: Secondary | ICD-10-CM | POA: Insufficient documentation

## 2012-08-20 DIAGNOSIS — C50919 Malignant neoplasm of unspecified site of unspecified female breast: Secondary | ICD-10-CM | POA: Insufficient documentation

## 2012-08-20 LAB — APTT: aPTT: 27 seconds (ref 24–37)

## 2012-08-20 LAB — CBC WITH DIFFERENTIAL/PLATELET
Lymphocytes Relative: 20 % (ref 12–46)
Lymphs Abs: 0.4 10*3/uL — ABNORMAL LOW (ref 0.7–4.0)
MCV: 101 fL — ABNORMAL HIGH (ref 78.0–100.0)
Neutrophils Relative %: 68 % (ref 43–77)
Platelets: 99 10*3/uL — ABNORMAL LOW (ref 150–400)
RBC: 2.97 MIL/uL — ABNORMAL LOW (ref 4.22–5.81)
WBC: 1.8 10*3/uL — ABNORMAL LOW (ref 4.0–10.5)

## 2012-08-20 LAB — PROTIME-INR: Prothrombin Time: 13 seconds (ref 11.6–15.2)

## 2012-08-20 MED ORDER — MIDAZOLAM HCL 2 MG/2ML IJ SOLN
INTRAMUSCULAR | Status: AC
  Filled 2012-08-20: qty 4

## 2012-08-20 MED ORDER — LIDOCAINE-PRILOCAINE 2.5-2.5 % EX CREA: TOPICAL_CREAM | CUTANEOUS | Status: DC | PRN

## 2012-08-20 MED ORDER — FENTANYL CITRATE 0.05 MG/ML IJ SOLN
INTRAMUSCULAR | Status: AC
  Filled 2012-08-20: qty 4

## 2012-08-20 MED ORDER — FENTANYL CITRATE 0.05 MG/ML IJ SOLN
INTRAMUSCULAR | Status: AC | PRN
  Administered 2012-08-20 (×3): 50 ug via INTRAVENOUS

## 2012-08-20 MED ORDER — CEFAZOLIN SODIUM 1-5 GM-% IV SOLN
INTRAVENOUS | Status: AC
  Filled 2012-08-20: qty 50

## 2012-08-20 MED ORDER — MIDAZOLAM HCL 2 MG/2ML IJ SOLN
INTRAMUSCULAR | Status: AC | PRN
  Administered 2012-08-20 (×4): 1 mg via INTRAVENOUS

## 2012-08-20 MED ORDER — CEFAZOLIN SODIUM 1-5 GM-% IV SOLN
1.0000 g | Freq: Once | INTRAVENOUS | Status: AC
  Administered 2012-08-20: 1 g via INTRAVENOUS

## 2012-08-20 MED ORDER — HEPARIN SOD (PORK) LOCK FLUSH 100 UNIT/ML IV SOLN
500.0000 [IU] | Freq: Once | INTRAVENOUS | Status: AC
  Administered 2012-08-20: 500 [IU] via INTRAVENOUS

## 2012-08-20 MED ORDER — LIDOCAINE HCL 1 % IJ SOLN
INTRAMUSCULAR | Status: AC
  Filled 2012-08-20: qty 20

## 2012-08-20 MED ORDER — SODIUM CHLORIDE 0.9 % IV SOLN: INTRAVENOUS | Status: DC

## 2012-08-20 NOTE — Telephone Encounter (Signed)
Pt called, he had PAC placed today and asking about Rx for EMLA cream.   Rx sent to pharmacy and I called pt w/ instructions on how to use EMLA.  He verbalized understanding.

## 2012-08-20 NOTE — H&P (Signed)
Chief Complaint: "I'm here for a port" Referring Physician:Curcio HPI: Thomas Bean is an 51 y.o. 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 He now developed biopsy proven metastatic disease in 03/2012 with met in right neck, left lung mass and left iliac. He has been having trouble with IV access for his chemo and is referred for port placement. PMHx and meds reviewed. Pt has had radical (R)neck dissection and (L)sided mass excision with flap. All well healed.    Past Medical History:  Past Medical History  Diagnosis Date  . Hemorrhoids   . PONV (postoperative nausea and vomiting)   . Hypertension     no medications at present  . GERD (gastroesophageal reflux disease)     no medication  . Depression     treated approx 12 years ago, no treatment at this time  . Hiatal hernia   . Neutropenia 12/23/2011  . Abscess 12/27/2011  . Hx of radiation therapy 11/15/11 -01/05/12    left lateral tongue  . Lesion of left lung 03/14/12    CT chest, [facility], squamous cell carcinoma  . Facial rash 05/24/2012  . Cancer 12/2010    left side of tongue T1 lesion excised 4/12  . Lung metastasis dx'd 03/2012  . Bone metastasis 04/25/12    PET scan-left iliac bone    Past Surgical History:  Past Surgical History  Procedure Date  . Inguinal herniorrhapy   . Tonsillectomy and adenoidectomy   . Excision of tongue mass     April 2012  . Hernia repair   . Neck mass excision 07/27/2011  . Radical neck dissection 07/27/2011    Procedure: RADICAL NECK DISSECTION;  Surgeon: Drema Halon, MD;  Location: [facility] OR;  Service: ENT;  Laterality: Left;  Marland Kitchen Mandibulectomy 09/2011    and scapular free flap  . Modified radical neck dissection 03/27/12    excision of lip lesion, lip benign, right neck squamous cell ca    Family History:  Family History  Problem Relation Age of Onset  . Adopted: Yes  . Other      biological parents may  have had cerebral aneurysm   . Vision loss Paternal Aunt     Social History:  reports that he quit smoking about 13 months ago. His smoking use included Cigarettes. He has a 5 pack-year smoking history. He has never used smokeless tobacco. He reports that he drinks alcohol. He reports that he does not use illicit drugs.  Allergies:  Allergies  Allergen Reactions  . Codeine Phosphate     REACTION: rash, swelling  . Tape     Blisters; please use paper tape    Medications: clindamycin (CLINDAGEL) 1 % gel    05/28/2012  05/28/2013   Sig - Route: Apply topically as needed. Apply to facial and chest skin rash. - Topical   Class: Historical Med   clobetasol cream (TEMOVATE) 0.05 %    05/21/2012  05/21/2013   Sig - Route: Apply topically as needed. Apply to rash BID - Topical   Class: Historical Med   gabapentin (NEURONTIN) 300 MG capsule  90 capsule  3  06/20/2012     Sig - Route: Take 1 capsule (300 mg total) by mouth 3 (three) times daily. - Oral   HYDROcodone-acetaminophen (NORCO/VICODIN) 5-325 MG per tablet  120 tablet  0  07/27/2012     Sig - Route: Take 1 tablet by mouth every 6 (six) hours  as needed for pain. - Oral   Class: Phone In   hydrocortisone cream 0.5 %    05/24/2012  05/24/2013   Sig - Route: Apply topically 2 (two) times daily as needed. Apply to axilla rash twice daily as needed. - Topical   Class: Historical Med   omeprazole (PRILOSEC) 10 MG capsule        Sig - Route: Take 10 mg by mouth daily. - Oral   Class: Historical Med   ondansetron (ZOFRAN) 8 MG tablet        Sig - Route: Take 8 mg by mouth every 12 (twelve) hours as needed. - Oral   Class: Historical Med   prochlorperazine (COMPAZINE) 10 MG tablet        Sig - Route: Take 10 mg by mouth every 6 (six) hours as needed. - Oral   Class: Historical Med   prochlorperazine (COMPAZINE) 10 MG tablet  30 tablet  0  07/13/2012     Sig: TAKE 1 TABLET BY MOUTH EVERY 6 HOURS AS NEEDED(NAUSEA OR VOMITING)   sodium fluoride  (PREVIDENT 5000 PLUS) 1.1 % CREA dental cream  1 Tube  prn  06/19/2012  06/18/2013   Sig: Apply thin ribbon of cream to tooth brush. Brush teeth for 2 minutes. Spit out excess-DO NOT swallow. DO NOT rinse afterwards. Repeat nightly.   Class: Fax   LORazepam (ATIVAN) 0.5 MG tablet (Discontinued)  30 tablet  0  07/13/2012  08/14/2012   Sig: TAKE 1 TABLET BY MOUTH EVERY 6 HOURS AS NEEDED FOR NAUSEA   Class: Print         Medications at End of Encounter         Disp  Refills  Start  End    clindamycin (CLINDAGEL) 1 % gel    05/28/2012  05/28/2013    Sig - Route: Apply topically as needed. Apply to facial and chest skin rash. - Topical    Class: Historical Med    clobetasol cream (TEMOVATE) 0.05 %    05/21/2012  05/21/2013    Sig - Route: Apply topically as needed. Apply to rash BID - Topical    Class: Historical Med    gabapentin (NEURONTIN) 300 MG capsule  90 capsule  3  06/20/2012      Sig - Route: Take 1 capsule (300 mg total) by mouth 3 (three) times daily. - Oral    HYDROcodone-acetaminophen (NORCO/VICODIN) 5-325 MG per tablet  120 tablet  0  07/27/2012      Sig - Route: Take 1 tablet by mouth every 6 (six) hours as needed for pain. - Oral    Class: Phone In    hydrocortisone cream 0.5 %    05/24/2012  05/24/2013    Sig - Route: Apply topically 2 (two) times daily as needed. Apply to axilla rash twice daily as needed. - Topical    Class: Historical Med    omeprazole (PRILOSEC) 10 MG capsule         Sig - Route: Take 10 mg by mouth daily. - Oral    Class: Historical Med    ondansetron (ZOFRAN) 8 MG tablet         Sig - Route: Take 8 mg by mouth every 12 (twelve) hours as needed. - Oral    Class: Historical Med    prochlorperazine (COMPAZINE) 10 MG tablet         Sig - Route: Take 10 mg by mouth every 6 (six) hours as needed. -  Oral    Class: Historical Med    prochlorperazine (COMPAZINE) 10 MG tablet  30 tablet  0  07/13/2012      Sig: TAKE 1 TABLET BY MOUTH EVERY 6 HOURS AS NEEDED(NAUSEA  OR VOMITING)    sodium fluoride (PREVIDENT 5000 PLUS) 1.1 % CREA dental cream  1 Tube  prn  06/19/2012  06/18/2013    Sig: Apply thin ribbon of cream to tooth brush. Brush teeth for 2 minutes      Please HPI for pertinent positives, otherwise complete 10 system ROS negative.  Physical Exam: Blood pressure 115/69, pulse 79, temperature 98.2 F (36.8 C), temperature source Oral, resp. rate 18, SpO2 99.00%. There is no height or weight on file to calculate BMI.   General Appearance:  Alert, cooperative, no distress, appears stated age  Head:  Normocephalic, without obvious abnormality, atraumatic  ENT: Unremarkable  Neck: Supple, trachea midline. Right and left neck scars well healed.  Lungs:   Clear to auscultation bilaterally, no w/r/r, respirations unlabored without use of accessory muscles.  Chest Wall:  No tenderness or deformity  Heart:  Regular rate and rhythm, S1, S2 normal, no murmur, rub or gallop. Carotids 2+ without bruit.  Neurologic: Normal affect, no gross deficits.   Results for orders placed during the hospital encounter of 08/20/12 (from the past 48 hour(s))  APTT     Status: Normal   Collection Time   08/20/12  8:30 AM      Component Value Range Comment   aPTT 27  24 - 37 seconds   PROTIME-INR     Status: Normal   Collection Time   08/20/12  8:30 AM      Component Value Range Comment   Prothrombin Time 13.0  11.6 - 15.2 seconds    INR 0.99  0.00 - 1.49   CBC WITH DIFFERENTIAL     Status: Abnormal (Preliminary result)   Collection Time   08/20/12  8:46 AM      Component Value Range Comment   WBC 1.8 (*) 4.0 - 10.5 K/uL    RBC 2.97 (*) 4.22 - 5.81 MIL/uL    Hemoglobin 10.1 (*) 13.0 - 17.0 g/dL    HCT 16.1 (*) 09.6 - 52.0 %    MCV 101.0 (*) 78.0 - 100.0 fL    MCH 34.0  26.0 - 34.0 pg    MCHC 33.7  30.0 - 36.0 g/dL    RDW 04.5 (*) 40.9 - 15.5 %    Platelets PENDING  150 - 400 K/uL    Neutrophils Relative 68  43 - 77 %    Neutro Abs 1.3 (*) 1.7 - 7.7 K/uL     Lymphocytes Relative 20  12 - 46 %    Lymphs Abs 0.4 (*) 0.7 - 4.0 K/uL    Monocytes Relative 8  3 - 12 %    Monocytes Absolute 0.1  0.1 - 1.0 K/uL    Eosinophils Relative 4  0 - 5 %    Eosinophils Absolute 0.1  0.0 - 0.7 K/uL    Basophils Relative 0  0 - 1 %    Basophils Absolute 0.0  0.0 - 0.1 K/uL    No results found.  Assessment/Plan Metastatic tongue cancer Discussed port placement procedure, including risks and complications. Labs reviewed. Consent signed in chart  Brayton El PA-C 08/20/2012, 9:07 AM

## 2012-08-20 NOTE — H&P (Signed)
Agree with PA note.  Hx of prior right neck dissection, no IJ seen on US.  Prior left neck surgery including rotational free flap, however, the left IJ is patent and the overlying skin intact.   Will proceed with placement of left IJ line.  Signed,  Heath K. McCullough, MD Vascular & Interventional Radiologist [facility]  

## 2012-08-20 NOTE — Procedures (Signed)
Interventional Radiology Procedure Note  Procedure: Placement of a LEFT IJ approach single lumen PowerPort.  Tip is positioned at the superior cavoatrial junction and catheter is ready for immediate use.  Complications: No immediate Recommendations:  - Ok to shower tomorrow - Do not submerge for 7 days - Routine line care   Signed,  Sterling Big, MD Vascular & Interventional Radiologist [facility]

## 2012-08-20 NOTE — Discharge Instructions (Signed)

## 2012-08-21 ENCOUNTER — Telehealth: Payer: Self-pay | Admitting: *Deleted

## 2012-08-21 ENCOUNTER — Ambulatory Visit: Payer: Medicaid Other

## 2012-08-21 NOTE — Telephone Encounter (Signed)
Ns at [address] Faxed prior authorization for gabapentin.  Request to Managed Care.

## 2012-08-23 ENCOUNTER — Encounter: Payer: Self-pay | Admitting: Radiation Oncology

## 2012-08-24 ENCOUNTER — Other Ambulatory Visit: Payer: Self-pay | Admitting: Pharmacist

## 2012-08-24 DIAGNOSIS — C029 Malignant neoplasm of tongue, unspecified: Secondary | ICD-10-CM

## 2012-08-27 ENCOUNTER — Other Ambulatory Visit: Payer: Self-pay | Admitting: Lab

## 2012-08-27 ENCOUNTER — Ambulatory Visit: Payer: Medicaid Other | Admitting: Nutrition

## 2012-08-27 ENCOUNTER — Telehealth: Payer: Self-pay | Admitting: Oncology

## 2012-08-27 ENCOUNTER — Ambulatory Visit (HOSPITAL_BASED_OUTPATIENT_CLINIC_OR_DEPARTMENT_OTHER): Payer: Medicaid Other

## 2012-08-27 ENCOUNTER — Ambulatory Visit (HOSPITAL_BASED_OUTPATIENT_CLINIC_OR_DEPARTMENT_OTHER): Payer: Medicaid Other | Admitting: Oncology

## 2012-08-27 ENCOUNTER — Other Ambulatory Visit (HOSPITAL_BASED_OUTPATIENT_CLINIC_OR_DEPARTMENT_OTHER): Payer: Medicaid Other

## 2012-08-27 VITALS — BP 122/77 | HR 87 | Temp 96.9°F | Resp 18 | Ht 72.0 in | Wt 135.9 lb

## 2012-08-27 DIAGNOSIS — C7951 Secondary malignant neoplasm of bone: Secondary | ICD-10-CM

## 2012-08-27 DIAGNOSIS — L270 Generalized skin eruption due to drugs and medicaments taken internally: Secondary | ICD-10-CM

## 2012-08-27 DIAGNOSIS — C029 Malignant neoplasm of tongue, unspecified: Secondary | ICD-10-CM

## 2012-08-27 DIAGNOSIS — M898X9 Other specified disorders of bone, unspecified site: Secondary | ICD-10-CM

## 2012-08-27 DIAGNOSIS — Z5111 Encounter for antineoplastic chemotherapy: Secondary | ICD-10-CM

## 2012-08-27 DIAGNOSIS — K1230 Oral mucositis (ulcerative), unspecified: Secondary | ICD-10-CM

## 2012-08-27 DIAGNOSIS — C770 Secondary and unspecified malignant neoplasm of lymph nodes of head, face and neck: Secondary | ICD-10-CM

## 2012-08-27 DIAGNOSIS — E46 Unspecified protein-calorie malnutrition: Secondary | ICD-10-CM

## 2012-08-27 DIAGNOSIS — C7800 Secondary malignant neoplasm of unspecified lung: Secondary | ICD-10-CM

## 2012-08-27 DIAGNOSIS — C01 Malignant neoplasm of base of tongue: Secondary | ICD-10-CM

## 2012-08-27 DIAGNOSIS — D61810 Antineoplastic chemotherapy induced pancytopenia: Secondary | ICD-10-CM

## 2012-08-27 DIAGNOSIS — C760 Malignant neoplasm of head, face and neck: Secondary | ICD-10-CM

## 2012-08-27 DIAGNOSIS — E889 Metabolic disorder, unspecified: Secondary | ICD-10-CM

## 2012-08-27 DIAGNOSIS — T451X5A Adverse effect of antineoplastic and immunosuppressive drugs, initial encounter: Secondary | ICD-10-CM

## 2012-08-27 LAB — CBC WITH DIFFERENTIAL/PLATELET
Basophils Absolute: 0 10*3/uL (ref 0.0–0.1)
HCT: 33.3 % — ABNORMAL LOW (ref 38.4–49.9)
HGB: 10.8 g/dL — ABNORMAL LOW (ref 13.0–17.1)
MONO#: 0.3 10*3/uL (ref 0.1–0.9)
NEUT#: 1.2 10*3/uL — ABNORMAL LOW (ref 1.5–6.5)
NEUT%: 60.2 % (ref 39.0–75.0)
WBC: 2 10*3/uL — ABNORMAL LOW (ref 4.0–10.3)
lymph#: 0.5 10*3/uL — ABNORMAL LOW (ref 0.9–3.3)

## 2012-08-27 LAB — BASIC METABOLIC PANEL (CC13)
BUN: 10 mg/dL (ref 7.0–26.0)
Chloride: 106 mEq/L (ref 98–107)
Glucose: 85 mg/dl (ref 70–99)
Potassium: 3.7 mEq/L (ref 3.5–5.1)

## 2012-08-27 LAB — MAGNESIUM (CC13): Magnesium: 2.3 mg/dl (ref 1.5–2.5)

## 2012-08-27 MED ORDER — DIPHENHYDRAMINE HCL 50 MG/ML IJ SOLN
50.0000 mg | Freq: Once | INTRAMUSCULAR | Status: AC
  Administered 2012-08-27: 50 mg via INTRAVENOUS

## 2012-08-27 MED ORDER — HEPARIN SOD (PORK) LOCK FLUSH 100 UNIT/ML IV SOLN
500.0000 [IU] | Freq: Once | INTRAVENOUS | Status: AC | PRN
  Administered 2012-08-27: 500 [IU]
  Filled 2012-08-27: qty 5

## 2012-08-27 MED ORDER — OSMOLITE 1.5 CAL PO LIQD: ORAL | Status: DC

## 2012-08-27 MED ORDER — SODIUM CHLORIDE 0.9 % IV SOLN
Freq: Once | INTRAVENOUS | Status: AC
  Administered 2012-08-27: 12:00:00 via INTRAVENOUS

## 2012-08-27 MED ORDER — PACLITAXEL CHEMO INJECTION 300 MG/50ML
48.0000 mg/m2 | Freq: Once | INTRAVENOUS | Status: AC
  Administered 2012-08-27: 90 mg via INTRAVENOUS
  Filled 2012-08-27: qty 15

## 2012-08-27 MED ORDER — ONDANSETRON 16 MG/50ML IVPB (CHCC)
16.0000 mg | Freq: Once | INTRAVENOUS | Status: AC
  Administered 2012-08-27: 16 mg via INTRAVENOUS

## 2012-08-27 MED ORDER — CETUXIMAB CHEMO IV INJECTION 200 MG/100ML
187.5000 mg/m2 | Freq: Once | INTRAVENOUS | Status: AC
  Administered 2012-08-27: 300 mg via INTRAVENOUS
  Filled 2012-08-27: qty 150

## 2012-08-27 MED ORDER — SODIUM CHLORIDE 0.9 % IJ SOLN
10.0000 mL | INTRAMUSCULAR | Status: DC | PRN
  Administered 2012-08-27: 10 mL
  Filled 2012-08-27: qty 10

## 2012-08-27 MED ORDER — FAMOTIDINE IN NACL 20-0.9 MG/50ML-% IV SOLN
20.0000 mg | Freq: Once | INTRAVENOUS | Status: AC
  Administered 2012-08-27: 20 mg via INTRAVENOUS

## 2012-08-27 MED ORDER — DEXAMETHASONE SODIUM PHOSPHATE 4 MG/ML IJ SOLN
20.0000 mg | Freq: Once | INTRAMUSCULAR | Status: AC
  Administered 2012-08-27: 20 mg via INTRAVENOUS

## 2012-08-27 MED ORDER — SODIUM CHLORIDE 0.9 % IV SOLN
180.0000 mg | Freq: Once | INTRAVENOUS | Status: AC
  Administered 2012-08-27: 180 mg via INTRAVENOUS
  Filled 2012-08-27: qty 18

## 2012-08-27 MED ORDER — HYDROCODONE-ACETAMINOPHEN 5-325 MG PO TABS: 1.0000 | ORAL_TABLET | Freq: Four times a day (QID) | ORAL | Status: DC | PRN

## 2012-08-27 NOTE — Telephone Encounter (Signed)
appts made and printed for pt aom °

## 2012-08-27 NOTE — Patient Instructions (Addendum)
1.  Metastatic head/neck cancer. 2.  Treatment: weekly Carboplatin/Taxol/Erbitux; two weeks on, 1 week off.  3.  Next restaging in Feb 2014 but sooner if concerning symptoms.

## 2012-08-27 NOTE — Progress Notes (Signed)
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 He now developed biopsy proven metastatic disease in 03/2012 with met in right neck, left lung mass and left illac.   PAST THERAPY:  1. 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 He developed recurrence in the left submandibular gland in August 2012. He underwent on 07/27/2011 radical left neck dissection by Dr. Ezzard Standing and Dr. Geryl Rankins. Pathology case number [id number] showed total 15 lymph nodes from left radical neck dissection that was all negative. However the left submandibular gland mass showed a 3.5 cm invasive SCC, moderately differentiates involving the adjacent skeletal muscle tissue and bone a tissue and extending into the inked margin. There was no evidence of any lymphatic invasion or perineural invasion. He underwent further resection by Dr. Mary Sella at [facility] on 09/29/2011.  2. He received adjuvant chemoradiation therapy with q 3 week Cisplatin given 11/15/11-12/05/11 (received 2 of 3 doses; 3rd dose held due to febrile neutropenia and development of flap abscess). He received concurrent XRT 11/15/11 through 01/05/12.  3. For right neck recurrence in 03/2012; he underwent palliative right neck dissection by Dr. Manson Passey at [facility].   CURRENT THERAPY: started on 05/15/2012 palliative chemo weekly Carboplatin/Taxol/Erbitux; 3 weeks on; 1 week off.  Due to prolonged cytopenia, his chemo has been changed to weekly; 2 weeks on, 1 wee off.   INTERVAL HISTORY: Thomas Bean 51 y.o. male returns for regular follow up with his mother.  He reported stable, mild skin rash on the face, scalp, and chest.  Since he stopped doxycycline, he has not had any more rash in the axilla.   He has mild fatigue; and spends lot of awake time at rest.  He is still independent of personal hygiene activities.  He continues to use PEG tube since he has jaw pain and can not open wide enough to eat by mouth.  He has been having hernia pain and has not been able to take more than 4 cans/day of Osmolyte.  He continues to lose weight.  He has been trying PEG tube feeding by gravity as opposed to using a syringe.    Patient denies fever, headache, visual changes, confusion, drenching night sweats, palpable lymph node swelling, mucositis, odynophagia, dysphagia, nausea vomiting, jaundice, chest pain, palpitation, shortness of breath, dyspnea on exertion, productive cough, gum bleeding, epistaxis, hematemesis, hemoptysis, abdominal pain, abdominal swelling, early satiety, melena, hematochezia, hematuria, spontaneous bleeding, joint swelling, joint pain, heat or cold intolerance, bowel bladder incontinence, back pain, focal motor weakness.  His mom thinks that he's slightly depressed.  However, patient himself denied this.     Past Medical History  Diagnosis Date  . Hemorrhoids   . PONV (postoperative nausea and vomiting)   . Hypertension     no medications at present  . GERD (gastroesophageal reflux disease)     no medication  . Depression     treated approx 12 years ago, no treatment at this time  . Hiatal hernia   . Neutropenia 12/23/2011  . Abscess 12/27/2011  . Hx of radiation therapy 11/15/11 -01/05/12    left lateral tongue  . Lesion of left lung 03/14/12    CT chest, [facility], squamous cell carcinoma  . Facial rash 05/24/2012  .  Cancer 12/2010    left side of tongue T1 lesion excised 4/12  . Lung metastasis dx'd 03/2012  . Bone metastasis 04/25/12    PET scan-left iliac bone  . History of radiation therapy 07/24/12-07/26/12    left iliac bone, 18Gy/3 sessions    Past Surgical History  Procedure Date  . Inguinal herniorrhapy   . Tonsillectomy and adenoidectomy   . Excision of tongue mass      April 2012  . Hernia repair   . Neck mass excision 07/27/2011  . Radical neck dissection 07/27/2011    Procedure: RADICAL NECK DISSECTION;  Surgeon: Drema Halon, MD;  Location: [facility] OR;  Service: ENT;  Laterality: Left;  Marland Kitchen Mandibulectomy 09/2011    and scapular free flap  . Modified radical neck dissection 03/27/12    excision of lip lesion, lip benign, right neck squamous cell ca    Current Outpatient Prescriptions  Medication Sig Dispense Refill  . clindamycin (CLINDAGEL) 1 % gel Apply topically as needed. Apply to facial and chest skin rash.      . gabapentin (NEURONTIN) 300 MG capsule Take 1 capsule (300 mg total) by mouth 3 (three) times daily.  90 capsule  3  . HYDROcodone-acetaminophen (NORCO/VICODIN) 5-325 MG per tablet Take 1 tablet by mouth every 6 (six) hours as needed for pain.  120 tablet  0  . hydrocortisone cream 0.5 % Apply topically 2 (two) times daily as needed. Apply to axilla rash twice daily as needed.      . lidocaine-prilocaine (EMLA) cream Apply topically as needed. Apply to [facility] site one hour prior to needle stick to numb skin.  30 g  3  . LORazepam (ATIVAN) 0.5 MG tablet TAKE 1 TABLET BY MOUTH EVERY 6 HOURS AS NEEDED FOR NAUSEA  30 tablet  1  . omeprazole (PRILOSEC) 10 MG capsule Take 10 mg by mouth daily.      . ondansetron (ZOFRAN) 8 MG tablet Take 8 mg by mouth every 12 (twelve) hours as needed.      . prochlorperazine (COMPAZINE) 10 MG tablet Take 10 mg by mouth every 6 (six) hours as needed.      . prochlorperazine (COMPAZINE) 10 MG tablet TAKE 1 TABLET BY MOUTH EVERY 6 HOURS AS NEEDED(NAUSEA OR VOMITING)  30 tablet  0  . sodium fluoride (PREVIDENT 5000 PLUS) 1.1 % CREA dental cream Apply thin ribbon of cream to tooth brush. Brush teeth for 2 minutes. Spit out excess-DO NOT swallow. DO NOT rinse afterwards. Repeat nightly.  1 Tube  prn  . clobetasol cream (TEMOVATE) 0.05 % Apply topically as needed. Apply to rash BID      . Nutritional  Supplements (FEEDING SUPPLEMENT, OSMOLITE 1.5 CAL,) LIQD Begin Osmolite 1.5 at 60 ml/hr for 12 hours nocturnal continuous feeding daily. Flush with 60 ml free water before and after continuous feedings. Continue 1 can of Osmolite 1.5 via gravity feeding TID with 60 ml free water before and after each bolus feeding. Please send pump to patient.  1422 mL  0   No current facility-administered medications for this visit.   Facility-Administered Medications Ordered in Other Visits  Medication Dose Route Frequency Provider Last Rate Last Dose  . CARBOplatin (PARAPLATIN) 180 mg in sodium chloride 0.9 % 100 mL chemo infusion  180 mg Intravenous Once Exie Parody, MD   180 mg at 08/27/12 1500  . sodium chloride 0.9 % injection 10 mL  10 mL Intracatheter PRN Exie Parody, MD  10 mL at 08/27/12 1554    ALLERGIES:  is allergic to codeine phosphate and tape.  REVIEW OF SYSTEMS:  The rest of the 14-point review of system was negative.   Filed Vitals:   08/27/12 1031  BP: 122/77  Pulse: 87  Temp: 96.9 F (36.1 C)  Resp: 18   Wt Readings from Last 3 Encounters:  08/27/12 135 lb 14.4 oz (61.644 kg)  08/06/12 139 lb 12.8 oz (63.413 kg)  07/26/12 141 lb 3.2 oz (64.048 kg)   ECOG Performance status: 1-2  PHYSICAL EXAMINATION:   General: thin-appearing man, in no acute distress. Eyes: no scleral icterus. ENT: There were no oropharyngeal lesions on my unaided exam. Neck was without thyromegaly. There was a flap in the left jaw which has healed. Right neck was fibrotic from resection. There was no erythema, purulent discharge, pain on palpation. Lymphatics: no clear palpable adenopathy. Respiratory: lungs were clear bilaterally without wheezing or crackles. Cardiovascular: Regular rate and rhythm, S1/S2, without murmur, rub or gallop. There was no pedal edema. GI: abdomen was soft, flat, nontender, nondistended, without organomegaly. PEG tube was dry, clean, intact. Muscoloskeletal: no spinal tenderness of  palpation of vertebral spine. Skin exam was without echymosis, petichae. There was mild aciniform skin rash on the face, trunk, and scalp.  Neuro exam was nonfocal. Patient was able to get on and off exam table without assistance. Gait was normal. Patient was alerted and oriented. Attention was good. Language was appropriate. Mood was normal without depression. Speech was not pressured. Thought content was not tangential.    LABORATORY/RADIOLOGY DATA:  Lab Results  Component Value Date   WBC 2.0* 08/27/2012   HGB 10.8* 08/27/2012   HCT 33.3* 08/27/2012   PLT 115* 08/27/2012   GLUCOSE 86 08/13/2012   CHOL 160 02/14/2008   TRIG 442* 02/14/2008   HDL 26.5* 02/14/2008   LDLDIRECT 64.8 02/14/2008   ALKPHOS 55 08/13/2012   ALT 22 08/13/2012   AST 28 08/13/2012   NA 141 08/13/2012   K 3.9 08/13/2012   CL 104 08/13/2012   CREATININE 0.6* 08/13/2012   BUN 14.0 08/13/2012   CO2 25 08/13/2012   INR 0.99 08/20/2012    ASSESSMENT AND PLAN:   1. Smoking: none at this time.   2. Recurrent left lateral tongue squamous cell carcinoma: He now has metastatic disease to the lung and bone.  This last CT scan showed mixed response with smaller lung lesion with with slight enlargement of the left iliac bone lesion (which could be due to sclerotic response).  - stable disease on palliative chemo Carboplatin/Taxol/Erbitux.  - Next restaging CT due in Feb 2014 (4 months from the last one in Oct 2013) unless he has concerning symptoms.  - I recommended continuing chemo without dose modification.  He agreed and would like to continue.  3.  Bone met: limited.  S/p palliative XRT.   He will continue with Zometa every 6 weeks to decrease risk of skeletal-related event.   4. Pancytopenia: This is from chemo.  There is o active bleeding. No transfusion indicated.   5. Calorie/Protein malnutrition: weight worse than before.  I recommended patient to start continuous tube feed at night.  He discussed this with Vernell Leep and  agreed to do so.   6. Mouth pain from treatment: He is on Vicodin prn.   7. Skin rash: Due to Erbitux.  He is on Cleocin cream and Hydrocortisone cream prn.  He did continue Doxycycline due to potential role  in axillary rash.   8.   Follow up: once per cycle.   9.  Code status:  Need to address after restaging CT in Feb 2014.     The length of time of the face-to-face encounter was 25 minutes. More than 50% of time was spent counseling and coordination of care.

## 2012-08-27 NOTE — Progress Notes (Signed)
Patient reports he is tolerating gravity feeds better than bolus feeding. He has had continued weight loss however, with weight documented as 135.9 pounds December 16 from 139.8 pounds November 25. Patient reports tolerance of feeding as sporadic. He tells me he consumes 5 cans of Osmolite 1.5 daily. He complains of decreased energy. He verbalizes no other nutrition related complaints.  Nutrition diagnosis: Inadequate oral intake continues. Diagnosis of inadequate enteral infusion continues.  Intervention: Patient will begin continuous nocturnal tube feedings utilizing Osmolite 1.5 at 60 mL an hour for 12 hours overnight from approximately 6 PM to 6 AM. Patient will flush with 60 mL of free water before and after continuous feedings. In addition, patient will continue gravity feeding utilizing one can of Osmolite 1.5 - 3 times a day daily with approximately 60 mL of free water before and after each gravity feeds. He will continue protein powder of 1-1/2 scoops daily. He is to continue to drink water by mouth. He will attempt to increase oral intake of Ensure Plus to supplement tube feedings. Tube feedings will provide 2330 calories 149 g protein, and 2586 mL of free water once patient increases to goal rate of 6 cans daily with 1-1/2 scoops protein powder. This meets greater than 100% of patient's estimated needs.  Monitoring, evaluation, goals: The patient will tolerate a combination of continuous nocturnal feedings plus gravity feeds plus oral intake to minimize any further weight loss.  Next visit: Monday, January 6, during chemotherapy.

## 2012-08-28 ENCOUNTER — Encounter: Payer: Self-pay | Admitting: Radiation Oncology

## 2012-08-28 ENCOUNTER — Ambulatory Visit
Admission: RE | Admit: 2012-08-28 | Discharge: 2012-08-28 | Disposition: A | Discharge status: Home / Self Care | Payer: Medicaid Other | Source: Ambulatory Visit | Attending: Radiation Oncology | Admitting: Radiation Oncology

## 2012-08-28 VITALS — BP 131/53 | HR 76 | Temp 97.7°F | Resp 20 | Wt 138.4 lb

## 2012-08-28 DIAGNOSIS — C7951 Secondary malignant neoplasm of bone: Secondary | ICD-10-CM

## 2012-08-28 NOTE — Progress Notes (Signed)
Patient here follow up rad tx to left iliac bone 07/24/12-07/26/12, 18Gy/3ssessions, Patient alert,oriented x3, says pain in hip gone, noticed getting better after 10 days once finished with radiation,  Has rash on face from Erbitux given yesterday along with carboplatin/taxol, patient still has jaw and facial pain 3-4 on 1-10 scale;  Constant, manageable with pain meds, gets 5-6 cans osmolite 1.5 cal via peg feedings, top of peg tube flap torn,  Has super glued it ,not held together, , has a new port a cath left subclavian last week,

## 2012-08-28 NOTE — Progress Notes (Signed)
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 His pain is much improved. He continues with his chemotherapy under the direction of Dr. Gaylyn Rong. His next chemotherapy is next Monday. He'll see Dr. Gaylyn Rong on January 6.  Physical examination: Alert and oriented. There is facial erythema secondary to his Erbitux . Wt Readings from Last 3 Encounters:  08/28/12 138 lb 6.4 oz (62.778 kg)  08/27/12 135 lb 14.4 oz (61.644 kg)  08/06/12 139 lb 12.8 oz (63.413 kg)   Temp Readings from Last 3 Encounters:  08/28/12 97.7 F (36.5 C) Oral  08/27/12 96.9 F (36.1 C) Oral  08/20/12 98.2 F (36.8 C) Oral   BP Readings from Last 3 Encounters:  08/28/12 131/53  08/27/12 122/77  08/20/12 125/78   Pulse Readings from Last 3 Encounters:  08/28/12 76  08/27/12 87  08/20/12 83   Nodes: There is no palpable lymphadenopathy in the neck. Oral cavity: Moderate trismus, no evidence for candidiasis.  Laboratory data: Lab Results  Component Value Date   WBC 2.0* 08/27/2012   HGB 10.8* 08/27/2012   HCT 33.3* 08/27/2012   MCV 102.5* 08/27/2012   PLT 115* 08/27/2012   CMP     Component Value Date/Time   NA 141 08/27/2012 1013   NA 138 06/25/2012 0858   K 3.7 08/27/2012 1013   K 3.1* 06/25/2012 0858   CL 106 08/27/2012 1013   CL 99 06/25/2012 0858   CO2 28 08/27/2012 1013   CO2 30 06/25/2012 0858   GLUCOSE 85 08/27/2012 1013   GLUCOSE 88 06/25/2012 0858   BUN 10.0 08/27/2012 1013   BUN 10 06/25/2012 0858   CREATININE 0.6* 08/27/2012 1013   CREATININE 0.47* 06/25/2012 0858   CALCIUM 9.3 08/27/2012 1013   CALCIUM 9.6 06/25/2012 0858   PROT 7.7 08/13/2012 1207   PROT 6.8 05/03/2012 0905   ALBUMIN 4.4 08/13/2012 1207   ALBUMIN 3.8 05/03/2012 0905   AST 28 08/13/2012 1207   AST 14 05/03/2012 0905   ALT 22 08/13/2012 1207   ALT 11 05/03/2012 0905    ALKPHOS 55 08/13/2012 1207   ALKPHOS 54 05/03/2012 0905   BILITOT 0.46 08/13/2012 1207   BILITOT 0.3 05/03/2012 0905   GFRNONAA >90 12/29/2011 0350   GFRAA >90 12/29/2011 0350    Impression: Satisfactory palliation of left iliac bone pain.  Plan: He'll continue with his systemic therapy under the direction of Dr. Gaylyn Rong. I can see the patient as needed.

## 2012-08-29 ENCOUNTER — Other Ambulatory Visit: Payer: Self-pay | Admitting: Oncology

## 2012-09-03 ENCOUNTER — Ambulatory Visit (HOSPITAL_BASED_OUTPATIENT_CLINIC_OR_DEPARTMENT_OTHER): Payer: Medicaid - Dental

## 2012-09-03 ENCOUNTER — Other Ambulatory Visit (HOSPITAL_BASED_OUTPATIENT_CLINIC_OR_DEPARTMENT_OTHER): Payer: Medicaid Other | Admitting: Lab

## 2012-09-03 VITALS — BP 103/77 | HR 65 | Temp 97.7°F | Resp 20

## 2012-09-03 DIAGNOSIS — C029 Malignant neoplasm of tongue, unspecified: Secondary | ICD-10-CM

## 2012-09-03 DIAGNOSIS — C7951 Secondary malignant neoplasm of bone: Secondary | ICD-10-CM

## 2012-09-03 DIAGNOSIS — C760 Malignant neoplasm of head, face and neck: Secondary | ICD-10-CM

## 2012-09-03 DIAGNOSIS — Z5112 Encounter for antineoplastic immunotherapy: Secondary | ICD-10-CM

## 2012-09-03 LAB — CBC WITH DIFFERENTIAL/PLATELET
Basophils Absolute: 0 10*3/uL (ref 0.0–0.1)
EOS%: 5.1 % (ref 0.0–7.0)
Eosinophils Absolute: 0.1 10*3/uL (ref 0.0–0.5)
HCT: 30.3 % — ABNORMAL LOW (ref 38.4–49.9)
HGB: 10 g/dL — ABNORMAL LOW (ref 13.0–17.1)
LYMPH%: 28.2 % (ref 14.0–49.0)
MCH: 33.7 pg — ABNORMAL HIGH (ref 27.2–33.4)
MCV: 102 fL — ABNORMAL HIGH (ref 79.3–98.0)
MONO%: 10.3 % (ref 0.0–14.0)
NEUT#: 1.3 10*3/uL — ABNORMAL LOW (ref 1.5–6.5)
NEUT%: 55.1 % (ref 39.0–75.0)
Platelets: 137 10*3/uL — ABNORMAL LOW (ref 140–400)
RDW: 15.4 % — ABNORMAL HIGH (ref 11.0–14.6)

## 2012-09-03 LAB — BASIC METABOLIC PANEL (CC13)
BUN: 8 mg/dL (ref 7.0–26.0)
CO2: 29 mEq/L (ref 22–29)
Calcium: 9.3 mg/dL (ref 8.4–10.4)
Glucose: 106 mg/dl — ABNORMAL HIGH (ref 70–99)
Sodium: 141 mEq/L (ref 136–145)

## 2012-09-03 MED ORDER — PACLITAXEL CHEMO INJECTION 300 MG/50ML
48.0000 mg/m2 | Freq: Once | INTRAVENOUS | Status: AC
  Administered 2012-09-03: 90 mg via INTRAVENOUS
  Filled 2012-09-03: qty 15

## 2012-09-03 MED ORDER — DEXAMETHASONE SODIUM PHOSPHATE 4 MG/ML IJ SOLN
20.0000 mg | Freq: Once | INTRAMUSCULAR | Status: AC
  Administered 2012-09-03: 20 mg via INTRAVENOUS

## 2012-09-03 MED ORDER — DIPHENHYDRAMINE HCL 50 MG/ML IJ SOLN
50.0000 mg | Freq: Once | INTRAMUSCULAR | Status: AC
  Administered 2012-09-03: 50 mg via INTRAVENOUS

## 2012-09-03 MED ORDER — HEPARIN SOD (PORK) LOCK FLUSH 100 UNIT/ML IV SOLN
500.0000 [IU] | Freq: Once | INTRAVENOUS | Status: AC | PRN
  Administered 2012-09-03: 500 [IU]
  Filled 2012-09-03: qty 5

## 2012-09-03 MED ORDER — SODIUM CHLORIDE 0.9 % IV SOLN
180.0000 mg | Freq: Once | INTRAVENOUS | Status: AC
  Administered 2012-09-03: 180 mg via INTRAVENOUS
  Filled 2012-09-03: qty 18

## 2012-09-03 MED ORDER — FAMOTIDINE IN NACL 20-0.9 MG/50ML-% IV SOLN
20.0000 mg | Freq: Once | INTRAVENOUS | Status: AC
  Administered 2012-09-03: 20 mg via INTRAVENOUS

## 2012-09-03 MED ORDER — SODIUM CHLORIDE 0.9 % IJ SOLN
10.0000 mL | INTRAMUSCULAR | Status: DC | PRN
  Administered 2012-09-03: 10 mL
  Filled 2012-09-03: qty 10

## 2012-09-03 MED ORDER — ONDANSETRON 16 MG/50ML IVPB (CHCC)
16.0000 mg | Freq: Once | INTRAVENOUS | Status: AC
  Administered 2012-09-03: 16 mg via INTRAVENOUS

## 2012-09-03 MED ORDER — SODIUM CHLORIDE 0.9 % IV SOLN
Freq: Once | INTRAVENOUS | Status: AC
  Administered 2012-09-03: 13:00:00 via INTRAVENOUS

## 2012-09-03 MED ORDER — CETUXIMAB CHEMO IV INJECTION 200 MG/100ML
187.5000 mg/m2 | Freq: Once | INTRAVENOUS | Status: AC
  Administered 2012-09-03: 300 mg via INTRAVENOUS
  Filled 2012-09-03: qty 150

## 2012-09-03 NOTE — Patient Instructions (Addendum)
[facility] Discharge Instructions for Patients Receiving Chemotherapy  Today you received the following chemotherapy agents Erbitux, Taxol and Carboplatin.  To help prevent nausea and vomiting after your treatment, we encourage you to take your nausea medication.   If you develop nausea and vomiting that is not controlled by your nausea medication, call the clinic. If it is after clinic hours your family physician or the after hours number for the clinic or go to the Emergency Department.   BELOW ARE SYMPTOMS THAT SHOULD BE REPORTED IMMEDIATELY:  *FEVER GREATER THAN 100.5 F  *CHILLS WITH OR WITHOUT FEVER  NAUSEA AND VOMITING THAT IS NOT CONTROLLED WITH YOUR NAUSEA MEDICATION  *UNUSUAL SHORTNESS OF BREATH  *UNUSUAL BRUISING OR BLEEDING  TENDERNESS IN MOUTH AND THROAT WITH OR WITHOUT PRESENCE OF ULCERS  *URINARY PROBLEMS  *BOWEL PROBLEMS  UNUSUAL RASH Items with * indicate a potential emergency and should be followed up as soon as possible.  One of the nurses will contact you 24 hours after your treatment. Please let the nurse know about any problems that you may have experienced. Feel free to call the clinic you have any questions or concerns. The clinic phone number is [phone].   I have been informed and understand all the instructions given to me. I know to contact the clinic, my physician, or go to the Emergency Department if any problems should occur. I do not have any questions at this time, but understand that I may call the clinic during office hours   should I have any questions or need assistance in obtaining follow up care.    __________________________________________  _____________  __________ Signature of Patient or Authorized Representative            Date                   Time    __________________________________________ Nurse's Signature

## 2012-09-07 ENCOUNTER — Other Ambulatory Visit: Payer: Self-pay | Admitting: Oncology

## 2012-09-10 ENCOUNTER — Ambulatory Visit: Payer: Self-pay

## 2012-09-13 ENCOUNTER — Other Ambulatory Visit: Payer: Self-pay | Admitting: Oncology

## 2012-09-13 ENCOUNTER — Other Ambulatory Visit: Payer: Self-pay | Admitting: *Deleted

## 2012-09-13 NOTE — Telephone Encounter (Signed)
Called in refill on lorazepam to pharmacy and called pt to inform him.  He verbalized understanding.

## 2012-09-17 ENCOUNTER — Ambulatory Visit: Payer: Medicaid - Dental | Admitting: Nutrition

## 2012-09-17 ENCOUNTER — Other Ambulatory Visit (HOSPITAL_BASED_OUTPATIENT_CLINIC_OR_DEPARTMENT_OTHER): Payer: Medicaid Other | Admitting: Lab

## 2012-09-17 ENCOUNTER — Encounter: Payer: Self-pay | Admitting: Oncology

## 2012-09-17 ENCOUNTER — Ambulatory Visit (HOSPITAL_BASED_OUTPATIENT_CLINIC_OR_DEPARTMENT_OTHER): Payer: Medicaid Other

## 2012-09-17 ENCOUNTER — Other Ambulatory Visit: Payer: Self-pay | Admitting: Lab

## 2012-09-17 ENCOUNTER — Ambulatory Visit (HOSPITAL_BASED_OUTPATIENT_CLINIC_OR_DEPARTMENT_OTHER): Payer: Medicaid - Dental | Admitting: Oncology

## 2012-09-17 VITALS — BP 123/75 | HR 107 | Temp 98.4°F | Resp 18 | Ht 72.0 in | Wt 136.7 lb

## 2012-09-17 DIAGNOSIS — C7800 Secondary malignant neoplasm of unspecified lung: Secondary | ICD-10-CM

## 2012-09-17 DIAGNOSIS — C7952 Secondary malignant neoplasm of bone marrow: Secondary | ICD-10-CM

## 2012-09-17 DIAGNOSIS — C7951 Secondary malignant neoplasm of bone: Secondary | ICD-10-CM

## 2012-09-17 DIAGNOSIS — C029 Malignant neoplasm of tongue, unspecified: Secondary | ICD-10-CM

## 2012-09-17 DIAGNOSIS — C760 Malignant neoplasm of head, face and neck: Secondary | ICD-10-CM

## 2012-09-17 DIAGNOSIS — E46 Unspecified protein-calorie malnutrition: Secondary | ICD-10-CM

## 2012-09-17 DIAGNOSIS — T451X5A Adverse effect of antineoplastic and immunosuppressive drugs, initial encounter: Secondary | ICD-10-CM

## 2012-09-17 DIAGNOSIS — Z5112 Encounter for antineoplastic immunotherapy: Secondary | ICD-10-CM

## 2012-09-17 LAB — COMPREHENSIVE METABOLIC PANEL (CC13)
ALT: 12 U/L (ref 0–55)
AST: 17 U/L (ref 5–34)
Albumin: 3.8 g/dL (ref 3.5–5.0)
CO2: 27 mEq/L (ref 22–29)
Calcium: 9.5 mg/dL (ref 8.4–10.4)
Chloride: 107 mEq/L (ref 98–107)
Potassium: 3.7 mEq/L (ref 3.5–5.1)
Sodium: 142 mEq/L (ref 136–145)
Total Protein: 6.9 g/dL (ref 6.4–8.3)

## 2012-09-17 LAB — CBC WITH DIFFERENTIAL/PLATELET
BASO%: 0.7 % (ref 0.0–2.0)
Eosinophils Absolute: 0.1 10*3/uL (ref 0.0–0.5)
LYMPH%: 20.5 % (ref 14.0–49.0)
MCHC: 32.2 g/dL (ref 32.0–36.0)
MONO#: 0.3 10*3/uL (ref 0.1–0.9)
NEUT#: 1.8 10*3/uL (ref 1.5–6.5)
Platelets: 158 10*3/uL (ref 140–400)
RBC: 3.28 10*6/uL — ABNORMAL LOW (ref 4.20–5.82)
RDW: 15.6 % — ABNORMAL HIGH (ref 11.0–14.6)
WBC: 2.7 10*3/uL — ABNORMAL LOW (ref 4.0–10.3)
lymph#: 0.6 10*3/uL — ABNORMAL LOW (ref 0.9–3.3)
nRBC: 0 % (ref 0–0)

## 2012-09-17 MED ORDER — ONDANSETRON 16 MG/50ML IVPB (CHCC)
16.0000 mg | Freq: Once | INTRAVENOUS | Status: AC
  Administered 2012-09-17: 16 mg via INTRAVENOUS

## 2012-09-17 MED ORDER — SODIUM CHLORIDE 0.9 % IV SOLN
180.0000 mg | Freq: Once | INTRAVENOUS | Status: AC
  Administered 2012-09-17: 180 mg via INTRAVENOUS
  Filled 2012-09-17: qty 18

## 2012-09-17 MED ORDER — DIPHENHYDRAMINE HCL 50 MG/ML IJ SOLN
50.0000 mg | Freq: Once | INTRAMUSCULAR | Status: AC
  Administered 2012-09-17: 50 mg via INTRAVENOUS

## 2012-09-17 MED ORDER — HEPARIN SOD (PORK) LOCK FLUSH 100 UNIT/ML IV SOLN
500.0000 [IU] | Freq: Once | INTRAVENOUS | Status: AC | PRN
  Administered 2012-09-17: 500 [IU]
  Filled 2012-09-17: qty 5

## 2012-09-17 MED ORDER — SODIUM CHLORIDE 0.9 % IJ SOLN
10.0000 mL | INTRAMUSCULAR | Status: DC | PRN
  Administered 2012-09-17: 10 mL
  Filled 2012-09-17: qty 10

## 2012-09-17 MED ORDER — PACLITAXEL CHEMO INJECTION 300 MG/50ML
48.0000 mg/m2 | Freq: Once | INTRAVENOUS | Status: AC
  Administered 2012-09-17: 90 mg via INTRAVENOUS
  Filled 2012-09-17: qty 15

## 2012-09-17 MED ORDER — CETUXIMAB CHEMO IV INJECTION 200 MG/100ML
187.5000 mg/m2 | Freq: Once | INTRAVENOUS | Status: AC
  Administered 2012-09-17: 300 mg via INTRAVENOUS
  Filled 2012-09-17: qty 150

## 2012-09-17 MED ORDER — FAMOTIDINE IN NACL 20-0.9 MG/50ML-% IV SOLN
20.0000 mg | Freq: Once | INTRAVENOUS | Status: AC
  Administered 2012-09-17: 20 mg via INTRAVENOUS

## 2012-09-17 MED ORDER — DEXAMETHASONE SODIUM PHOSPHATE 10 MG/ML IJ SOLN
20.0000 mg | Freq: Once | INTRAMUSCULAR | Status: AC
  Administered 2012-09-17: 20 mg via INTRAVENOUS

## 2012-09-17 MED ORDER — SODIUM CHLORIDE 0.9 % IV SOLN
Freq: Once | INTRAVENOUS | Status: AC
  Administered 2012-09-17: 13:00:00 via INTRAVENOUS

## 2012-09-17 NOTE — Progress Notes (Signed)
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 He now developed biopsy proven metastatic disease in 03/2012 with met in right neck, left lung mass and left illac.   PAST THERAPY:  1. 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 He developed recurrence in the left submandibular gland in August 2012. He underwent on 07/27/2011 radical left neck dissection by Dr. Ezzard Standing and Dr. Geryl Rankins. Pathology case number [id number] showed total 15 lymph nodes from left radical neck dissection that was all negative. However the left submandibular gland mass showed a 3.5 cm invasive SCC, moderately differentiates involving the adjacent skeletal muscle tissue and bone a tissue and extending into the inked margin. There was no evidence of any lymphatic invasion or perineural invasion. He underwent further resection by Dr. Mary Sella at [facility] on 09/29/2011.  2. He received adjuvant chemoradiation therapy with q 3 week Cisplatin given 11/15/11-12/05/11 (received 2 of 3 doses; 3rd dose held due to febrile neutropenia and development of flap abscess). He received concurrent XRT 11/15/11 through 01/05/12.  3. For right neck recurrence in 03/2012; he underwent palliative right neck dissection by Dr. Manson Passey at [facility].   CURRENT THERAPY: started on 05/15/2012 palliative chemo weekly Carboplatin/Taxol/Erbitux; 3 weeks on; 1 week off.  Due to prolonged cytopenia, his chemo has been changed to weekly; 2 weeks on, 1 week off.   INTERVAL HISTORY: Thomas Bean 52 y.o. male returns for regular follow up with his father.  He reported stable, mild skin rash on the face, scalp, and chest.  Since he stopped doxycycline, he has not had any more rash in the axilla.   He has mild fatigue; and spends lot of awake time at rest.  He is still independent of personal hygiene activities.  He continues to use PEG tube since he has jaw pain and can not open wide enough to eat by mouth. Taking 5-6 cans of Osmolyte. Weight is up by 1 lbs. He has been trying PEG tube feeding by gravity as opposed to using a syringe.    Patient denies fever, headache, visual changes, confusion, drenching night sweats, palpable lymph node swelling, mucositis, odynophagia, dysphagia, nausea vomiting, jaundice, chest pain, palpitation, shortness of breath, dyspnea on exertion, productive cough, gum bleeding, epistaxis, hematemesis, hemoptysis, abdominal pain, abdominal swelling, early satiety, melena, hematochezia, hematuria, spontaneous bleeding, joint swelling, joint pain, heat or cold intolerance, bowel bladder incontinence, back pain, focal motor weakness.     Past Medical History  Diagnosis Date  . Hemorrhoids   . PONV (postoperative nausea and vomiting)   . Hypertension     no medications at present  . GERD (gastroesophageal reflux disease)     no medication  . Depression     treated approx 12 years ago, no treatment at this time  . Hiatal hernia   . Neutropenia 12/23/2011  . Abscess 12/27/2011  . Hx of radiation therapy 11/15/11 -01/05/12    left lateral tongue  . Lesion of left lung 03/14/12    CT chest, [facility], squamous cell carcinoma  . Facial rash 05/24/2012  . Cancer 12/2010    left side of tongue T1 lesion excised 4/12  . Lung metastasis dx'd 03/2012  . Bone metastasis 04/25/12    PET scan-left iliac  bone  . History of radiation therapy 07/24/12-07/26/12    left iliac bone, 18Gy/3 sessions    Past Surgical History  Procedure Date  . Inguinal herniorrhapy   . Tonsillectomy and adenoidectomy   . Excision of tongue mass     April 2012  . Hernia repair   . Neck mass excision 07/27/2011  . Radical neck dissection 07/27/2011    Procedure: RADICAL NECK DISSECTION;  Surgeon:  Drema Halon, MD;  Location: [facility] OR;  Service: ENT;  Laterality: Left;  Marland Kitchen Mandibulectomy 09/2011    and scapular free flap  . Modified radical neck dissection 03/27/12    excision of lip lesion, lip benign, right neck squamous cell ca    Current Outpatient Prescriptions  Medication Sig Dispense Refill  . clindamycin (CLINDAGEL) 1 % gel Apply topically as needed. Apply to facial and chest skin rash.      . clobetasol cream (TEMOVATE) 0.05 % Apply topically as needed. Apply to rash BID      . gabapentin (NEURONTIN) 300 MG capsule Take 1 capsule (300 mg total) by mouth 3 (three) times daily.  90 capsule  3  . HYDROcodone-acetaminophen (NORCO/VICODIN) 5-325 MG per tablet Take 1 tablet by mouth every 6 (six) hours as needed for pain.  120 tablet  0  . hydrocortisone cream 0.5 % Apply topically 2 (two) times daily as needed. Apply to axilla rash twice daily as needed.      . lidocaine-prilocaine (EMLA) cream Apply topically as needed. Apply to [facility] site one hour prior to needle stick to numb skin.  30 g  3  . LORazepam (ATIVAN) 0.5 MG tablet TAKE 1 TABLET BY MOUTH EVERY 6 HOURS AS NEEDED FOR NAUSEA  30 tablet  0  . Nutritional Supplements (FEEDING SUPPLEMENT, OSMOLITE 1.5 CAL,) LIQD Begin Osmolite 1.5 at 60 ml/hr for 12 hours nocturnal continuous feeding daily. Flush with 60 ml free water before and after continuous feedings. Continue 1 can of Osmolite 1.5 via gravity feeding TID with 60 ml free water before and after each bolus feeding. Please send pump to patient.  1422 mL  0  . omeprazole (PRILOSEC) 10 MG capsule Take 10 mg by mouth daily.      . ondansetron (ZOFRAN) 8 MG tablet Take 8 mg by mouth every 12 (twelve) hours as needed.      . prochlorperazine (COMPAZINE) 10 MG tablet Take 10 mg by mouth every 6 (six) hours as needed.      . prochlorperazine (COMPAZINE) 10 MG tablet TAKE 1 TABLET BY MOUTH EVERY 6 HOURS AS NEEDED(NAUSEA OR VOMITING)  30 tablet  0  . sodium fluoride (PREVIDENT  5000 PLUS) 1.1 % CREA dental cream Apply thin ribbon of cream to tooth brush. Brush teeth for 2 minutes. Spit out excess-DO NOT swallow. DO NOT rinse afterwards. Repeat nightly.  1 Tube  prn    ALLERGIES:  is allergic to codeine phosphate and tape.  REVIEW OF SYSTEMS:  The rest of the 14-point review of system was negative.   Filed Vitals:   09/17/12 1107  BP: 123/75  Pulse: 107  Temp: 98.4 F (36.9 C)  Resp: 18   Wt Readings from Last 3 Encounters:  09/17/12 136 lb 11.2 oz (62.007 kg)  08/28/12 138 lb 6.4 oz (62.778 kg)  08/27/12 135 lb 14.4 oz (61.644 kg)   ECOG Performance status: 1-2  PHYSICAL EXAMINATION:   General: thin-appearing man, in no acute distress. Eyes: no scleral icterus. ENT: There were  no oropharyngeal lesions on my unaided exam. Neck was without thyromegaly. There was a flap in the left jaw which has healed. Right neck was fibrotic from resection. There was no erythema, purulent discharge, pain on palpation. Lymphatics: no clear palpable adenopathy. Respiratory: lungs were clear bilaterally without wheezing or crackles. Cardiovascular: Regular rate and rhythm, S1/S2, without murmur, rub or gallop. There was no pedal edema. GI: abdomen was soft, flat, nontender, nondistended, without organomegaly. PEG tube was dry, clean, intact. Muscoloskeletal: no spinal tenderness of palpation of vertebral spine. Skin exam was without echymosis, petichae. There was mild aciniform skin rash on the face, trunk, and scalp.  Neuro exam was nonfocal. Patient was able to get on and off exam table without assistance. Gait was normal. Patient was alerted and oriented. Attention was good. Language was appropriate. Mood was normal without depression. Speech was not pressured. Thought content was not tangential.    LABORATORY/RADIOLOGY DATA:  Lab Results  Component Value Date   WBC 2.7* 09/17/2012   HGB 11.0* 09/17/2012   HCT 34.2* 09/17/2012   PLT 158 09/17/2012   GLUCOSE 99 09/17/2012   CHOL 160  02/14/2008   TRIG 442* 02/14/2008   HDL 26.5* 02/14/2008   LDLDIRECT 64.8 02/14/2008   ALKPHOS 52 09/17/2012   ALT 12 09/17/2012   AST 17 09/17/2012   NA 142 09/17/2012   K 3.7 09/17/2012   CL 107 09/17/2012   CREATININE 0.6* 09/17/2012   BUN 7.0 09/17/2012   CO2 27 09/17/2012   INR 0.99 08/20/2012    ASSESSMENT AND PLAN:   1. Smoking: none at this time.   2. Recurrent left lateral tongue squamous cell carcinoma: He now has metastatic disease to the lung and bone.  This last CT scan showed mixed response with smaller lung lesion with with slight enlargement of the left iliac bone lesion (which could be due to sclerotic response).  - stable disease on palliative chemo Carboplatin/Taxol/Erbitux.  - Next restaging CT due in Feb 2014 (4 months from the last one in Oct 2013). I have ordered this today to be done mid-February. - I recommended continuing chemo without dose modification.  He agreed and would like to continue.  3.  Bone met: limited.  S/p palliative XRT.   He will continue with Zometa every 6 weeks to decrease risk of skeletal-related event.   4. Pancytopenia: This is from chemo.  There is no active bleeding. No transfusion indicated.   5. Calorie/Protein malnutrition: weight worse than before.  I recommended patient to start continuous tube feed at night.  He discussed this with Vernell Leep and agreed to do so.   6. Mouth pain from treatment: He is on Vicodin prn.   7. Skin rash: Due to Erbitux.  He is on Cleocin cream and Hydrocortisone cream prn.  He has stopped Doxycycline due to potential role in axillary rash.   8.   Follow up: once per cycle.   9.  Code status:  Need to address after restaging CT in Feb 2014.     The length of time of the face-to-face encounter was 15 minutes. More than 50% of time was spent counseling and coordination of care.

## 2012-09-17 NOTE — Progress Notes (Signed)
Patient continues with chemotherapy treatments. He states he is utilizing 6 cans of Osmolite 1.5 via feeding tube daily. He utilizes bolus tube feeding and has refused continuous pump feedings. He is no longer using glutamine or protein powder. He states he does consume soft foods throughout the day. He continues to drink and flush his feeding tube with water. Patient reports tolerating 2 cans of Osmolite 1.5 - 3 times a day. Weight continues to decline and was documented as 136.7 pounds on January 6. Patient exhibits signs of severe malnutrition in the context of chronic illness secondary to greater than 10% weight loss in 6 months and severe muscle mass depletion including protruding and prominent clavicle. 6 cans of Osmolite 1.5 provides 2130 calories, 89.4 g protein, and 1086 mL free water. This is approximately 34 calories per kilogram from tube feedings alone.  Nutrition diagnosis: Inadequate oral intake and inadequate enteral nutrition infusion have improved.  Intervention: Patient is to continue with bolus tube feeding since he is refusing continuous feedings at this time. I've encouraged him to to continue increased oral intake as tolerated. I've also encouraged him to add an additional can of Osmolite 1.5 daily if possible via tube feeding. Patient able to verbalize and teach back strategies for increased nutrition. I've educated him on the importance of continued hydration.  Monitoring, evaluation, goals: The patient will continue to tolerate tube feedings plus oral intake to minimize further weight loss.  Next visit: Monday, January 13, during chemotherapy.

## 2012-09-17 NOTE — Progress Notes (Signed)
Thomas Bean with [employer] left message to say Thomas Bean is patient's case Financial controller. The info I faxed never reached Thomas Bean. She said to call and get the fax number for Thomas Bean. Thomas Bean has met his deductible. I called and left message for Thomas Bean  [phone] for her to call me back with her fax number so I can fax the bills to her for the patient. I did advise her she can leave the fax number on my voicemail.

## 2012-09-17 NOTE — Patient Instructions (Addendum)
[facility] Discharge Instructions for Patients Receiving Chemotherapy  Today you received the following chemotherapy agents Taxol/Carboplatin/Erbitux  To help prevent nausea and vomiting after your treatment, we encourage you to take your nausea medication as prescribed.   If you develop nausea and vomiting that is not controlled by your nausea medication, call the clinic. If it is after clinic hours your family physician or the after hours number for the clinic or go to the Emergency Department.   BELOW ARE SYMPTOMS THAT SHOULD BE REPORTED IMMEDIATELY:  *FEVER GREATER THAN 100.5 F  *CHILLS WITH OR WITHOUT FEVER  NAUSEA AND VOMITING THAT IS NOT CONTROLLED WITH YOUR NAUSEA MEDICATION  *UNUSUAL SHORTNESS OF BREATH  *UNUSUAL BRUISING OR BLEEDING  TENDERNESS IN MOUTH AND THROAT WITH OR WITHOUT PRESENCE OF ULCERS  *URINARY PROBLEMS  *BOWEL PROBLEMS  UNUSUAL RASH Items with * indicate a potential emergency and should be followed up as soon as possible.  Feel free to call the clinic you have any questions or concerns. The clinic phone number is [phone].   I have been informed and understand all the instructions given to me. I know to contact the clinic, my physician, or go to the Emergency Department if any problems should occur. I do not have any questions at this time, but understand that I may call the clinic during office hours   should I have any questions or need assistance in obtaining follow up care.    __________________________________________  _____________  __________ Signature of Patient or Authorized Representative            Date                   Time    __________________________________________ Nurse's Signature

## 2012-09-18 ENCOUNTER — Encounter: Payer: Self-pay | Admitting: Oncology

## 2012-09-18 ENCOUNTER — Telehealth: Payer: Self-pay | Admitting: Oncology

## 2012-09-18 NOTE — Telephone Encounter (Signed)
called pt with  appts and he will p/u contrast and sch on 09/24/12      anne

## 2012-09-23 ENCOUNTER — Other Ambulatory Visit: Payer: Self-pay | Admitting: Oncology

## 2012-09-24 ENCOUNTER — Telehealth: Payer: Self-pay | Admitting: *Deleted

## 2012-09-24 ENCOUNTER — Ambulatory Visit: Payer: Medicaid - Dental | Admitting: Nutrition

## 2012-09-24 ENCOUNTER — Ambulatory Visit (HOSPITAL_BASED_OUTPATIENT_CLINIC_OR_DEPARTMENT_OTHER): Payer: Medicaid Other

## 2012-09-24 ENCOUNTER — Other Ambulatory Visit: Payer: Self-pay | Admitting: *Deleted

## 2012-09-24 ENCOUNTER — Other Ambulatory Visit: Payer: Self-pay | Admitting: Oncology

## 2012-09-24 ENCOUNTER — Other Ambulatory Visit (HOSPITAL_BASED_OUTPATIENT_CLINIC_OR_DEPARTMENT_OTHER): Payer: Medicaid Other

## 2012-09-24 VITALS — BP 120/76 | HR 85 | Temp 98.2°F | Resp 18

## 2012-09-24 DIAGNOSIS — C770 Secondary and unspecified malignant neoplasm of lymph nodes of head, face and neck: Secondary | ICD-10-CM

## 2012-09-24 DIAGNOSIS — Z5111 Encounter for antineoplastic chemotherapy: Secondary | ICD-10-CM

## 2012-09-24 DIAGNOSIS — Z5112 Encounter for antineoplastic immunotherapy: Secondary | ICD-10-CM

## 2012-09-24 DIAGNOSIS — C760 Malignant neoplasm of head, face and neck: Secondary | ICD-10-CM

## 2012-09-24 DIAGNOSIS — C029 Malignant neoplasm of tongue, unspecified: Secondary | ICD-10-CM

## 2012-09-24 DIAGNOSIS — C7951 Secondary malignant neoplasm of bone: Secondary | ICD-10-CM

## 2012-09-24 LAB — CBC WITH DIFFERENTIAL/PLATELET
Basophils Absolute: 0 10*3/uL (ref 0.0–0.1)
Eosinophils Absolute: 0.2 10*3/uL (ref 0.0–0.5)
HCT: 32.8 % — ABNORMAL LOW (ref 38.4–49.9)
HGB: 10.8 g/dL — ABNORMAL LOW (ref 13.0–17.1)
LYMPH%: 28.6 % (ref 14.0–49.0)
MCV: 103.5 fL — ABNORMAL HIGH (ref 79.3–98.0)
MONO#: 0.2 10*3/uL (ref 0.1–0.9)
MONO%: 7.5 % (ref 0.0–14.0)
NEUT#: 1.6 10*3/uL (ref 1.5–6.5)
NEUT%: 57.1 % (ref 39.0–75.0)
Platelets: 177 10*3/uL (ref 140–400)
RBC: 3.17 10*6/uL — ABNORMAL LOW (ref 4.20–5.82)
WBC: 2.8 10*3/uL — ABNORMAL LOW (ref 4.0–10.3)
nRBC: 0 % (ref 0–0)

## 2012-09-24 LAB — BASIC METABOLIC PANEL (CC13)
CO2: 27 mEq/L (ref 22–29)
Chloride: 104 mEq/L (ref 98–107)
Creatinine: 0.5 mg/dL — ABNORMAL LOW (ref 0.7–1.3)
Glucose: 74 mg/dl (ref 70–99)

## 2012-09-24 MED ORDER — SODIUM CHLORIDE 0.9 % IV SOLN: Freq: Once | INTRAVENOUS | Status: DC

## 2012-09-24 MED ORDER — ZOLEDRONIC ACID 4 MG/5ML IV CONC
4.0000 mg | Freq: Once | INTRAVENOUS | Status: AC
  Administered 2012-09-24: 4 mg via INTRAVENOUS
  Filled 2012-09-24: qty 5

## 2012-09-24 MED ORDER — FAMOTIDINE IN NACL 20-0.9 MG/50ML-% IV SOLN
20.0000 mg | Freq: Once | INTRAVENOUS | Status: AC
  Administered 2012-09-24: 20 mg via INTRAVENOUS

## 2012-09-24 MED ORDER — HEPARIN SOD (PORK) LOCK FLUSH 100 UNIT/ML IV SOLN
500.0000 [IU] | Freq: Once | INTRAVENOUS | Status: AC | PRN
  Administered 2012-09-24: 500 [IU]
  Filled 2012-09-24: qty 5

## 2012-09-24 MED ORDER — DEXTROSE 5 % IV SOLN
48.0000 mg/m2 | Freq: Once | INTRAVENOUS | Status: AC
  Administered 2012-09-24: 90 mg via INTRAVENOUS
  Filled 2012-09-24: qty 15

## 2012-09-24 MED ORDER — DIPHENHYDRAMINE HCL 50 MG/ML IJ SOLN: 50.0000 mg | Freq: Once | INTRAMUSCULAR | Status: DC

## 2012-09-24 MED ORDER — DIPHENHYDRAMINE HCL 50 MG/ML IJ SOLN
50.0000 mg | Freq: Once | INTRAMUSCULAR | Status: AC
  Administered 2012-09-24: 50 mg via INTRAVENOUS

## 2012-09-24 MED ORDER — DEXAMETHASONE SODIUM PHOSPHATE 10 MG/ML IJ SOLN
20.0000 mg | Freq: Once | INTRAMUSCULAR | Status: AC
  Administered 2012-09-24: 20 mg via INTRAVENOUS

## 2012-09-24 MED ORDER — ONDANSETRON 16 MG/50ML IVPB (CHCC)
16.0000 mg | Freq: Once | INTRAVENOUS | Status: AC
  Administered 2012-09-24: 16 mg via INTRAVENOUS

## 2012-09-24 MED ORDER — SODIUM CHLORIDE 0.9 % IJ SOLN
10.0000 mL | INTRAMUSCULAR | Status: DC | PRN
  Administered 2012-09-24: 10 mL
  Filled 2012-09-24: qty 10

## 2012-09-24 MED ORDER — CETUXIMAB CHEMO IV INJECTION 200 MG/100ML
187.5000 mg/m2 | Freq: Once | INTRAVENOUS | Status: AC
  Administered 2012-09-24: 300 mg via INTRAVENOUS
  Filled 2012-09-24: qty 150

## 2012-09-24 MED ORDER — SODIUM CHLORIDE 0.9 % IV SOLN
Freq: Once | INTRAVENOUS | Status: AC
  Administered 2012-09-24: 12:00:00 via INTRAVENOUS

## 2012-09-24 MED ORDER — SODIUM CHLORIDE 0.9 % IV SOLN
180.0000 mg | Freq: Once | INTRAVENOUS | Status: AC
  Administered 2012-09-24: 180 mg via INTRAVENOUS
  Filled 2012-09-24: qty 18

## 2012-09-24 NOTE — Patient Instructions (Addendum)
[facility] Discharge Instructions for Patients Receiving Chemotherapy  Today you received the following chemotherapy agents :  Erbitux,  Taxol,  Carboplatin,  Zometa.  To help prevent nausea and vomiting after your treatment, we encourage you to take your nausea medication as instructed by your physician.    If you develop nausea and vomiting that is not controlled by your nausea medication, call the clinic. If it is after clinic hours your family physician or the after hours number for the clinic or go to the Emergency Department.   BELOW ARE SYMPTOMS THAT SHOULD BE REPORTED IMMEDIATELY:  *FEVER GREATER THAN 100.5 F  *CHILLS WITH OR WITHOUT FEVER  NAUSEA AND VOMITING THAT IS NOT CONTROLLED WITH YOUR NAUSEA MEDICATION  *UNUSUAL SHORTNESS OF BREATH  *UNUSUAL BRUISING OR BLEEDING  TENDERNESS IN MOUTH AND THROAT WITH OR WITHOUT PRESENCE OF ULCERS  *URINARY PROBLEMS  *BOWEL PROBLEMS  UNUSUAL RASH Items with * indicate a potential emergency and should be followed up as soon as possible.  One of the nurses will contact you 24 hours after your treatment. Please let the nurse know about any problems that you may have experienced. Feel free to call the clinic you have any questions or concerns. The clinic phone number is [phone].   I have been informed and understand all the instructions given to me. I know to contact the clinic, my physician, or go to the Emergency Department if any problems should occur. I do not have any questions at this time, but understand that I may call the clinic during office hours   should I have any questions or need assistance in obtaining follow up care.    __________________________________________  _____________  __________ Signature of Patient or Authorized Representative            Date                   Time    __________________________________________ Nurse's Signature

## 2012-09-24 NOTE — Progress Notes (Signed)
Patient reports things are going pretty well. He reports improved tolerance of tube feedings since he has increased his nausea medicine. He now tolerates a minimum of 6 cans of Osmolite 1.5 daily. He would like to begin utilizing whey protein powder since he has had better tolerance. He has no other concerns today.  Nutrition diagnosis: Inadequate oral intake and inadequate enteral nutrition infusion have improved.  Intervention: I have educated patient on some additional foods to try to incorporate into his diet. I've encouraged a minimum of 6 cans of Osmolite 1.5 daily. I have educated him on the importance of adequate hydration.  Monitoring, evaluation, goals: Patient will continue to tolerate tube feedings with oral intake to promote weight stabilization.  Next visit: Monday, January 27, during chemotherapy.

## 2012-09-24 NOTE — Telephone Encounter (Signed)
Rx for hydrocodone given to pt in lobby.  He asks if compazine was also refilled and it appears Dr. Gaylyn Rong also refilled this one electronically.  Pt aware.

## 2012-09-24 NOTE — Telephone Encounter (Signed)
Per staff message and POF I have scheduled appts.  JMW  

## 2012-09-24 NOTE — Telephone Encounter (Signed)
Pt came by and wanted feb schedule...emailed michelle to add chemo..the patient aware..he will ck my chart for appts.

## 2012-09-27 ENCOUNTER — Other Ambulatory Visit: Payer: Self-pay | Admitting: Oncology

## 2012-09-28 ENCOUNTER — Encounter: Payer: Self-pay | Admitting: Oncology

## 2012-09-28 NOTE — Progress Notes (Signed)
Spoke with patient again. He is concerned about having the ded again starting 10/13/12 and the amt is now 7920. It was [id number]. I advised I would ask Ms. McNeil at [phone] and have her call him to explain to him. He will meet any deductible anyway. I called [facility] billing and spoke with Verlon Au and I faxed her letter that his active and it needs to be billed again. Her fax is [phone]. He said the same is happening when he goes and gets his meds also. Letter shows he is active 04/12/12-10/12/12. Patient is getting very confused on how often he will be getting the deductible.

## 2012-10-01 ENCOUNTER — Ambulatory Visit: Payer: Self-pay

## 2012-10-08 ENCOUNTER — Ambulatory Visit (HOSPITAL_BASED_OUTPATIENT_CLINIC_OR_DEPARTMENT_OTHER): Payer: Medicaid Other

## 2012-10-08 ENCOUNTER — Other Ambulatory Visit (HOSPITAL_BASED_OUTPATIENT_CLINIC_OR_DEPARTMENT_OTHER): Payer: Medicaid Other

## 2012-10-08 ENCOUNTER — Ambulatory Visit: Payer: Medicaid Other | Admitting: Nutrition

## 2012-10-08 VITALS — BP 136/77 | HR 77 | Temp 98.6°F | Resp 18

## 2012-10-08 DIAGNOSIS — C029 Malignant neoplasm of tongue, unspecified: Secondary | ICD-10-CM

## 2012-10-08 DIAGNOSIS — C01 Malignant neoplasm of base of tongue: Secondary | ICD-10-CM

## 2012-10-08 DIAGNOSIS — C7951 Secondary malignant neoplasm of bone: Secondary | ICD-10-CM

## 2012-10-08 DIAGNOSIS — Z5111 Encounter for antineoplastic chemotherapy: Secondary | ICD-10-CM

## 2012-10-08 DIAGNOSIS — Z5112 Encounter for antineoplastic immunotherapy: Secondary | ICD-10-CM

## 2012-10-08 LAB — CBC WITH DIFFERENTIAL/PLATELET
BASO%: 1.8 % (ref 0.0–2.0)
Eosinophils Absolute: 0.1 10*3/uL (ref 0.0–0.5)
LYMPH%: 28.5 % (ref 14.0–49.0)
MCHC: 32.5 g/dL (ref 32.0–36.0)
MONO#: 0.3 10*3/uL (ref 0.1–0.9)
MONO%: 8.9 % (ref 0.0–14.0)
NEUT#: 1.6 10*3/uL (ref 1.5–6.5)
Platelets: 167 10*3/uL (ref 140–400)
RBC: 3.25 10*6/uL — ABNORMAL LOW (ref 4.20–5.82)
RDW: 15.2 % — ABNORMAL HIGH (ref 11.0–14.6)
WBC: 2.8 10*3/uL — ABNORMAL LOW (ref 4.0–10.3)
nRBC: 0 % (ref 0–0)

## 2012-10-08 LAB — COMPREHENSIVE METABOLIC PANEL (CC13)
ALT: 12 U/L (ref 0–55)
AST: 16 U/L (ref 5–34)
CO2: 28 mEq/L (ref 22–29)
Chloride: 105 mEq/L (ref 98–107)
Creatinine: 0.6 mg/dL — ABNORMAL LOW (ref 0.7–1.3)
Sodium: 140 mEq/L (ref 136–145)
Total Bilirubin: 0.31 mg/dL (ref 0.20–1.20)
Total Protein: 6.7 g/dL (ref 6.4–8.3)

## 2012-10-08 MED ORDER — HEPARIN SOD (PORK) LOCK FLUSH 100 UNIT/ML IV SOLN
500.0000 [IU] | Freq: Once | INTRAVENOUS | Status: AC | PRN
  Administered 2012-10-08: 500 [IU]
  Filled 2012-10-08: qty 5

## 2012-10-08 MED ORDER — DIPHENHYDRAMINE HCL 50 MG/ML IJ SOLN: 50.0000 mg | Freq: Once | INTRAMUSCULAR | Status: DC

## 2012-10-08 MED ORDER — DEXTROSE 5 % IV SOLN
48.0000 mg/m2 | Freq: Once | INTRAVENOUS | Status: AC
  Administered 2012-10-08: 90 mg via INTRAVENOUS
  Filled 2012-10-08: qty 15

## 2012-10-08 MED ORDER — SODIUM CHLORIDE 0.9 % IJ SOLN
10.0000 mL | INTRAMUSCULAR | Status: DC | PRN
  Administered 2012-10-08: 10 mL
  Filled 2012-10-08: qty 10

## 2012-10-08 MED ORDER — DIPHENHYDRAMINE HCL 50 MG/ML IJ SOLN
50.0000 mg | Freq: Once | INTRAMUSCULAR | Status: AC
  Administered 2012-10-08: 50 mg via INTRAVENOUS

## 2012-10-08 MED ORDER — SODIUM CHLORIDE 0.9 % IV SOLN
Freq: Once | INTRAVENOUS | Status: AC
  Administered 2012-10-08: 13:00:00 via INTRAVENOUS

## 2012-10-08 MED ORDER — CETUXIMAB CHEMO IV INJECTION 200 MG/100ML
187.5000 mg/m2 | Freq: Once | INTRAVENOUS | Status: AC
  Administered 2012-10-08: 300 mg via INTRAVENOUS
  Filled 2012-10-08: qty 150

## 2012-10-08 MED ORDER — FAMOTIDINE IN NACL 20-0.9 MG/50ML-% IV SOLN
20.0000 mg | Freq: Once | INTRAVENOUS | Status: AC
  Administered 2012-10-08: 20 mg via INTRAVENOUS

## 2012-10-08 MED ORDER — DEXAMETHASONE SODIUM PHOSPHATE 4 MG/ML IJ SOLN
20.0000 mg | Freq: Once | INTRAMUSCULAR | Status: AC
  Administered 2012-10-08: 20 mg via INTRAVENOUS

## 2012-10-08 MED ORDER — ONDANSETRON 16 MG/50ML IVPB (CHCC)
16.0000 mg | Freq: Once | INTRAVENOUS | Status: AC
  Administered 2012-10-08: 16 mg via INTRAVENOUS

## 2012-10-08 MED ORDER — SODIUM CHLORIDE 0.9 % IV SOLN
180.0000 mg | Freq: Once | INTRAVENOUS | Status: AC
  Administered 2012-10-08: 180 mg via INTRAVENOUS
  Filled 2012-10-08: qty 18

## 2012-10-08 NOTE — Patient Instructions (Addendum)
[facility] Discharge Instructions for Patients Receiving Chemotherapy  Today you received the following chemotherapy agents Erbitux, Carboplatin, Taxol  To help prevent nausea and vomiting after your treatment, we encourage you to take your nausea medication AS DIRECTED.   If you develop nausea and vomiting that is not controlled by your nausea medication, call the clinic. If it is after clinic hours your family physician or the after hours number for the clinic or go to the Emergency Department.   BELOW ARE SYMPTOMS THAT SHOULD BE REPORTED IMMEDIATELY:  *FEVER GREATER THAN 100.5 F  *CHILLS WITH OR WITHOUT FEVER  NAUSEA AND VOMITING THAT IS NOT CONTROLLED WITH YOUR NAUSEA MEDICATION  *UNUSUAL SHORTNESS OF BREATH  *UNUSUAL BRUISING OR BLEEDING  TENDERNESS IN MOUTH AND THROAT WITH OR WITHOUT PRESENCE OF ULCERS  *URINARY PROBLEMS  *BOWEL PROBLEMS  UNUSUAL RASH Items with * indicate a potential emergency and should be followed up as soon as possible.  One of the nurses will contact you 24 hours after your treatment. Please let the nurse know about any problems that you may have experienced. Feel free to call the clinic you have any questions or concerns. The clinic phone number is [phone].   I have been informed and understand all the instructions given to me. I know to contact the clinic, my physician, or go to the Emergency Department if any problems should occur. I do not have any questions at this time, but understand that I may call the clinic during office hours   should I have any questions or need assistance in obtaining follow up care.    __________________________________________  _____________  __________ Signature of Patient or Authorized Representative            Date                   Time    __________________________________________ Nurse's Signature

## 2012-10-08 NOTE — Progress Notes (Signed)
I spoke briefly with patient.  He reports he is doing well. He states he is eating more than he was before. He reports ice cream is tolerated very well. He continues tube feedings of Osmolite 1.5-6 cans daily. Last weight was 136.7 pounds documented January 6.  Nutrition diagnosis: Inadequate oral intake and inadequate enteral nutrition infusion have improved.  Intervention: I have again educated patient on strategies for incorporating more foods into his oral diet. He is to continue to feedings of Osmolite 1.5-6 cans daily.  Monitoring, evaluation, goals: Patient has begun to tolerate increased oral intake along with tube feedings. Weight loss cannot be evaluated since patient has not been weighed since January 6.  Next visit: Monday, February 24, during chemotherapy.

## 2012-10-11 ENCOUNTER — Other Ambulatory Visit: Payer: Self-pay | Admitting: Oncology

## 2012-10-15 ENCOUNTER — Ambulatory Visit (HOSPITAL_BASED_OUTPATIENT_CLINIC_OR_DEPARTMENT_OTHER): Payer: Medicaid Other

## 2012-10-15 ENCOUNTER — Other Ambulatory Visit (HOSPITAL_BASED_OUTPATIENT_CLINIC_OR_DEPARTMENT_OTHER): Payer: Medicaid Other

## 2012-10-15 ENCOUNTER — Other Ambulatory Visit: Payer: Self-pay | Admitting: Oncology

## 2012-10-15 VITALS — BP 117/71 | HR 103 | Temp 98.4°F | Resp 18 | Ht 72.0 in | Wt 138.8 lb

## 2012-10-15 DIAGNOSIS — C760 Malignant neoplasm of head, face and neck: Secondary | ICD-10-CM

## 2012-10-15 DIAGNOSIS — C029 Malignant neoplasm of tongue, unspecified: Secondary | ICD-10-CM

## 2012-10-15 DIAGNOSIS — C01 Malignant neoplasm of base of tongue: Secondary | ICD-10-CM

## 2012-10-15 DIAGNOSIS — Z5111 Encounter for antineoplastic chemotherapy: Secondary | ICD-10-CM

## 2012-10-15 DIAGNOSIS — Z5112 Encounter for antineoplastic immunotherapy: Secondary | ICD-10-CM

## 2012-10-15 DIAGNOSIS — C7951 Secondary malignant neoplasm of bone: Secondary | ICD-10-CM

## 2012-10-15 LAB — BASIC METABOLIC PANEL (CC13)
CO2: 27 mEq/L (ref 22–29)
Chloride: 101 mEq/L (ref 98–107)
Sodium: 137 mEq/L (ref 136–145)

## 2012-10-15 LAB — CBC WITH DIFFERENTIAL/PLATELET
Eosinophils Absolute: 0 10*3/uL (ref 0.0–0.5)
MONO#: 0.2 10*3/uL (ref 0.1–0.9)
NEUT#: 3.1 10*3/uL (ref 1.5–6.5)
RBC: 3.19 10*6/uL — ABNORMAL LOW (ref 4.20–5.82)
RDW: 15 % — ABNORMAL HIGH (ref 11.0–14.6)
WBC: 3.8 10*3/uL — ABNORMAL LOW (ref 4.0–10.3)
nRBC: 0 % (ref 0–0)

## 2012-10-15 LAB — MAGNESIUM: Magnesium: 1.9 mg/dL (ref 1.5–2.5)

## 2012-10-15 MED ORDER — CETUXIMAB CHEMO IV INJECTION 200 MG/100ML
187.5000 mg/m2 | Freq: Once | INTRAVENOUS | Status: AC
  Administered 2012-10-15: 300 mg via INTRAVENOUS
  Filled 2012-10-15: qty 150

## 2012-10-15 MED ORDER — FAMOTIDINE IN NACL 20-0.9 MG/50ML-% IV SOLN
20.0000 mg | Freq: Once | INTRAVENOUS | Status: AC
  Administered 2012-10-15: 20 mg via INTRAVENOUS

## 2012-10-15 MED ORDER — DIPHENHYDRAMINE HCL 50 MG/ML IJ SOLN: 50.0000 mg | Freq: Once | INTRAMUSCULAR | Status: DC

## 2012-10-15 MED ORDER — SODIUM CHLORIDE 0.9 % IJ SOLN
10.0000 mL | INTRAMUSCULAR | Status: DC | PRN
  Administered 2012-10-15: 10 mL
  Filled 2012-10-15: qty 10

## 2012-10-15 MED ORDER — ONDANSETRON 16 MG/50ML IVPB (CHCC)
16.0000 mg | Freq: Once | INTRAVENOUS | Status: AC
  Administered 2012-10-15: 16 mg via INTRAVENOUS

## 2012-10-15 MED ORDER — DIPHENHYDRAMINE HCL 50 MG/ML IJ SOLN
50.0000 mg | Freq: Once | INTRAMUSCULAR | Status: AC
  Administered 2012-10-15: 50 mg via INTRAVENOUS

## 2012-10-15 MED ORDER — HEPARIN SOD (PORK) LOCK FLUSH 100 UNIT/ML IV SOLN
500.0000 [IU] | Freq: Once | INTRAVENOUS | Status: AC | PRN
  Administered 2012-10-15: 500 [IU]
  Filled 2012-10-15: qty 5

## 2012-10-15 MED ORDER — PACLITAXEL CHEMO INJECTION 300 MG/50ML
48.0000 mg/m2 | Freq: Once | INTRAVENOUS | Status: AC
  Administered 2012-10-15: 90 mg via INTRAVENOUS
  Filled 2012-10-15: qty 15

## 2012-10-15 MED ORDER — SODIUM CHLORIDE 0.9 % IV SOLN
180.0000 mg | Freq: Once | INTRAVENOUS | Status: AC
  Administered 2012-10-15: 180 mg via INTRAVENOUS
  Filled 2012-10-15: qty 18

## 2012-10-15 MED ORDER — DEXAMETHASONE SODIUM PHOSPHATE 4 MG/ML IJ SOLN
20.0000 mg | Freq: Once | INTRAMUSCULAR | Status: AC
  Administered 2012-10-15: 20 mg via INTRAVENOUS

## 2012-10-15 MED ORDER — CARBOPLATIN CHEMO INTRADERMAL TEST DOSE 100MCG/0.02ML
100.0000 ug | Freq: Once | INTRADERMAL | Status: AC
Start: 2012-10-15 — End: 2012-10-15
  Administered 2012-10-15: 100 ug via INTRADERMAL
  Filled 2012-10-15: qty 0.01

## 2012-10-15 MED ORDER — SODIUM CHLORIDE 0.9 % IV SOLN
Freq: Once | INTRAVENOUS | Status: AC
  Administered 2012-10-15: 09:00:00 via INTRAVENOUS

## 2012-10-15 NOTE — Patient Instructions (Signed)
[facility] Discharge Instructions for Patients Receiving Chemotherapy  Today you received the following chemotherapy agents :  Taxol, Carboplatin, Erbitux.  To help prevent nausea and vomiting after your treatment, we encourage you to take your nausea medication as instructed by your physician.    If you develop nausea and vomiting that is not controlled by your nausea medication, call the clinic. If it is after clinic hours your family physician or the after hours number for the clinic or go to the Emergency Department.   BELOW ARE SYMPTOMS THAT SHOULD BE REPORTED IMMEDIATELY:  *FEVER GREATER THAN 100.5 F  *CHILLS WITH OR WITHOUT FEVER  NAUSEA AND VOMITING THAT IS NOT CONTROLLED WITH YOUR NAUSEA MEDICATION  *UNUSUAL SHORTNESS OF BREATH  *UNUSUAL BRUISING OR BLEEDING  TENDERNESS IN MOUTH AND THROAT WITH OR WITHOUT PRESENCE OF ULCERS  *URINARY PROBLEMS  *BOWEL PROBLEMS  UNUSUAL RASH Items with * indicate a potential emergency and should be followed up as soon as possible.  One of the nurses will contact you 24 hours after your treatment. Please let the nurse know about any problems that you may have experienced. Feel free to call the clinic you have any questions or concerns. The clinic phone number is [phone].   I have been informed and understand all the instructions given to me. I know to contact the clinic, my physician, or go to the Emergency Department if any problems should occur. I do not have any questions at this time, but understand that I may call the clinic during office hours   should I have any questions or need assistance in obtaining follow up care.    __________________________________________  _____________  __________ Signature of Patient or Authorized Representative            Date                   Time    __________________________________________ Nurse's Signature

## 2012-10-15 NOTE — Progress Notes (Signed)
Carbo skin test performed in left anterior forearm at 0914 am intradermally. 0920  -   Negative. 0930  -   Negative. 0945  -   Negative. Pt tolerated procedure without problems.

## 2012-10-25 ENCOUNTER — Ambulatory Visit (HOSPITAL_COMMUNITY): Payer: Medicaid Other

## 2012-10-25 ENCOUNTER — Encounter: Payer: Self-pay | Admitting: Oncology

## 2012-10-25 ENCOUNTER — Other Ambulatory Visit: Payer: Self-pay | Admitting: Oncology

## 2012-10-26 ENCOUNTER — Ambulatory Visit (HOSPITAL_COMMUNITY): Admission: RE | Admit: 2012-10-26 | Payer: Self-pay | Source: Ambulatory Visit

## 2012-10-27 ENCOUNTER — Other Ambulatory Visit: Payer: Self-pay

## 2012-10-28 NOTE — Patient Instructions (Signed)
1.  Diagnosis:  Metastatic head and neck cancer. 2.  Treatment:  Carboplatin/Taxol/Erbitux.   3.  Status:  Restaging CT scan today showed

## 2012-10-29 ENCOUNTER — Telehealth: Payer: Self-pay | Admitting: *Deleted

## 2012-10-29 ENCOUNTER — Encounter: Payer: Self-pay | Admitting: Oncology

## 2012-10-29 ENCOUNTER — Encounter (HOSPITAL_COMMUNITY): Payer: Self-pay

## 2012-10-29 ENCOUNTER — Other Ambulatory Visit (HOSPITAL_BASED_OUTPATIENT_CLINIC_OR_DEPARTMENT_OTHER): Payer: Self-pay | Admitting: Lab

## 2012-10-29 ENCOUNTER — Ambulatory Visit (HOSPITAL_BASED_OUTPATIENT_CLINIC_OR_DEPARTMENT_OTHER): Payer: Self-pay | Admitting: Oncology

## 2012-10-29 ENCOUNTER — Ambulatory Visit: Payer: Self-pay

## 2012-10-29 ENCOUNTER — Ambulatory Visit (HOSPITAL_COMMUNITY)
Admission: RE | Admit: 2012-10-29 | Discharge: 2012-10-29 | Disposition: A | Discharge status: Home / Self Care | Payer: Self-pay | Source: Ambulatory Visit | Attending: Oncology | Admitting: Oncology

## 2012-10-29 ENCOUNTER — Telehealth: Payer: Self-pay | Admitting: Oncology

## 2012-10-29 VITALS — BP 131/88 | HR 83 | Temp 97.3°F | Resp 20 | Ht 72.0 in | Wt 136.9 lb

## 2012-10-29 DIAGNOSIS — C7800 Secondary malignant neoplasm of unspecified lung: Secondary | ICD-10-CM

## 2012-10-29 DIAGNOSIS — C01 Malignant neoplasm of base of tongue: Secondary | ICD-10-CM

## 2012-10-29 DIAGNOSIS — C7951 Secondary malignant neoplasm of bone: Secondary | ICD-10-CM

## 2012-10-29 DIAGNOSIS — J9819 Other pulmonary collapse: Secondary | ICD-10-CM | POA: Insufficient documentation

## 2012-10-29 DIAGNOSIS — J984 Other disorders of lung: Secondary | ICD-10-CM | POA: Insufficient documentation

## 2012-10-29 DIAGNOSIS — C760 Malignant neoplasm of head, face and neck: Secondary | ICD-10-CM

## 2012-10-29 DIAGNOSIS — N281 Cyst of kidney, acquired: Secondary | ICD-10-CM | POA: Insufficient documentation

## 2012-10-29 DIAGNOSIS — K4090 Unilateral inguinal hernia, without obstruction or gangrene, not specified as recurrent: Secondary | ICD-10-CM | POA: Insufficient documentation

## 2012-10-29 DIAGNOSIS — I700 Atherosclerosis of aorta: Secondary | ICD-10-CM | POA: Insufficient documentation

## 2012-10-29 DIAGNOSIS — Z931 Gastrostomy status: Secondary | ICD-10-CM | POA: Insufficient documentation

## 2012-10-29 DIAGNOSIS — D739 Disease of spleen, unspecified: Secondary | ICD-10-CM | POA: Insufficient documentation

## 2012-10-29 DIAGNOSIS — C770 Secondary and unspecified malignant neoplasm of lymph nodes of head, face and neck: Secondary | ICD-10-CM

## 2012-10-29 DIAGNOSIS — Z923 Personal history of irradiation: Secondary | ICD-10-CM | POA: Insufficient documentation

## 2012-10-29 DIAGNOSIS — K8020 Calculus of gallbladder without cholecystitis without obstruction: Secondary | ICD-10-CM | POA: Insufficient documentation

## 2012-10-29 DIAGNOSIS — T451X5A Adverse effect of antineoplastic and immunosuppressive drugs, initial encounter: Secondary | ICD-10-CM

## 2012-10-29 DIAGNOSIS — Z9089 Acquired absence of other organs: Secondary | ICD-10-CM | POA: Insufficient documentation

## 2012-10-29 DIAGNOSIS — C7952 Secondary malignant neoplasm of bone marrow: Secondary | ICD-10-CM

## 2012-10-29 DIAGNOSIS — C029 Malignant neoplasm of tongue, unspecified: Secondary | ICD-10-CM | POA: Insufficient documentation

## 2012-10-29 LAB — CBC WITH DIFFERENTIAL/PLATELET
BASO%: 0.7 % (ref 0.0–2.0)
Basophils Absolute: 0 10*3/uL (ref 0.0–0.1)
EOS%: 2.2 % (ref 0.0–7.0)
HCT: 33.9 % — ABNORMAL LOW (ref 38.4–49.9)
LYMPH%: 28.5 % (ref 14.0–49.0)
MCH: 34 pg — ABNORMAL HIGH (ref 27.2–33.4)
MCHC: 33 g/dL (ref 32.0–36.0)
MCV: 103 fL — ABNORMAL HIGH (ref 79.3–98.0)
MONO%: 10.2 % (ref 0.0–14.0)
NEUT%: 58.4 % (ref 39.0–75.0)
Platelets: 189 10*3/uL (ref 140–400)
lymph#: 0.8 10*3/uL — ABNORMAL LOW (ref 0.9–3.3)

## 2012-10-29 MED ORDER — LORAZEPAM 0.5 MG PO TABS: 0.5000 mg | ORAL_TABLET | Freq: Four times a day (QID) | ORAL | Status: DC | PRN

## 2012-10-29 MED ORDER — HYDROCODONE-ACETAMINOPHEN 5-325 MG PO TABS: 1.0000 | ORAL_TABLET | Freq: Four times a day (QID) | ORAL | Status: DC | PRN

## 2012-10-29 MED ORDER — IOHEXOL 300 MG/ML  SOLN
125.0000 mL | Freq: Once | INTRAMUSCULAR | Status: AC | PRN
  Administered 2012-10-29: 125 mL via INTRAVENOUS

## 2012-10-29 NOTE — Progress Notes (Signed)
Rx for Hydrocodone and Lorazepam called into [facility] per pt request.

## 2012-10-29 NOTE — Telephone Encounter (Signed)
Called [employer] to ask how pt's Osmolite is being billed.  Pt states he thinks bill being sent to his [employer] who have set up a fund for him.  As this is pt's main source of nutrition,  It is possible Medicaid may cover the cost.  Left VM for billing dept to return call to ask if they can try to bill Medicaid if they are not already doing so.  Waiting for return call.

## 2012-10-29 NOTE — Progress Notes (Signed)
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 He now developed biopsy proven metastatic disease in 03/2012 with met in right neck, left lung mass and left illac.   PAST THERAPY:  1. 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 He developed recurrence in the left submandibular gland in August 2012. He underwent on 07/27/2011 radical left neck dissection by Dr. Ezzard Standing and Dr. Geryl Rankins. Pathology case number [id number] showed total 15 lymph nodes from left radical neck dissection that was all negative. However the left submandibular gland mass showed a 3.5 cm invasive SCC, moderately differentiates involving the adjacent skeletal muscle tissue and bone a tissue and extending into the inked margin. There was no evidence of any lymphatic invasion or perineural invasion. He underwent further resection by Dr. Mary Sella at [facility] on 09/29/2011.  2. He received adjuvant chemoradiation therapy with q 3 week Cisplatin given 11/15/11-12/05/11 (received 2 of 3 doses; 3rd dose held due to febrile neutropenia and development of flap abscess). He received concurrent XRT 11/15/11 through 01/05/12.  3. For right neck recurrence in 03/2012; he underwent palliative right neck dissection by Dr. Manson Passey at [facility].   CURRENT THERAPY: started on 05/15/2012 palliative chemo weekly Carboplatin/Taxol/Erbitux; 3 weeks on; 1 week off.  Due to prolonged cytopenia, his chemo has been changed to weekly; 2 weeks on, 1 wee off.   INTERVAL HISTORY: Thomas Bean 52 y.o. male returns for regular follow up with his father.  He reported that he has been having moderate fatigue.  He is independent of personal hygiene activities.  Otherwise, he spends most of his  awake time sitting around.  He does not leave the house much due to his fatigue.  He has been trying to eat mechanical soft foods.  He still uses his PEG tube for Osmolytes about 5-6 cans/day.  He has skin rash on the chest > face > back.  He has been using Hydrocortisone cream occasionally.  He has not been using doxycycline for Erbitux-induced skin rash due to severe skin rash in the axilla.  He has chronic pain in the jaw due to history of treatments.  He needs Lortab on a routine basis.  He denied fever, headache, SOB, chest pain, abd pain, bleeding, neuropathy, diarrhea.  The rest of the 14-point review of system was negative.    Past Medical History  Diagnosis Date  . Hemorrhoids   . PONV (postoperative nausea and vomiting)   . Hypertension     no medications at present  . GERD (gastroesophageal reflux disease)     no medication  . Depression     treated approx 12 years ago, no treatment at this time  . Hiatal hernia   . Neutropenia 12/23/2011  . Abscess 12/27/2011  . Hx of radiation therapy 11/15/11 -01/05/12    left lateral tongue  . Lesion of left lung 03/14/12    CT chest, [facility], squamous cell carcinoma  . Facial rash 05/24/2012  . History of radiation therapy 07/24/12-07/26/12    left iliac bone, 18Gy/3 sessions  . Cancer 12/2010    left side of tongue T1 lesion excised 4/12  . Lung metastasis dx'd 03/2012  . Bone metastasis 04/25/12    PET scan-left iliac bone  Past Surgical History  Procedure Laterality Date  . Inguinal herniorrhapy    . Tonsillectomy and adenoidectomy    . Excision of tongue mass      April 2012  . Hernia repair    . Neck mass excision  07/27/2011  . Radical neck dissection  07/27/2011    Procedure: RADICAL NECK DISSECTION;  Surgeon: Drema Halon, MD;  Location: [facility] OR;  Service: ENT;  Laterality: Left;  Marland Kitchen Mandibulectomy  09/2011    and scapular free flap  . Modified radical neck dissection  03/27/12    excision of lip lesion, lip benign, right  neck squamous cell ca    Current Outpatient Prescriptions  Medication Sig Dispense Refill  . clindamycin (CLINDAGEL) 1 % gel Apply topically as needed. Apply to facial and chest skin rash.      . clobetasol cream (TEMOVATE) 0.05 % Apply topically as needed. Apply to rash BID      . gabapentin (NEURONTIN) 300 MG capsule TAKE ONE CAPSULE BY MOUTH THREE TIMES DAILY  90 capsule  0  . HYDROcodone-acetaminophen (NORCO/VICODIN) 5-325 MG per tablet Take 1 tablet by mouth every 6 (six) hours as needed for pain.  120 tablet  0  . hydrocortisone cream 0.5 % Apply topically 2 (two) times daily as needed. Apply to axilla rash twice daily as needed.      . lidocaine-prilocaine (EMLA) cream Apply topically as needed. Apply to [facility] site one hour prior to needle stick to numb skin.  30 g  3  . LORazepam (ATIVAN) 0.5 MG tablet Place 1 tablet (0.5 mg total) under the tongue every 6 (six) hours as needed for anxiety (nausea/vomiting).  60 tablet  0  . Nutritional Supplements (FEEDING SUPPLEMENT, OSMOLITE 1.5 CAL,) LIQD Begin Osmolite 1.5 at 60 ml/hr for 12 hours nocturnal continuous feeding daily. Flush with 60 ml free water before and after continuous feedings. Continue 1 can of Osmolite 1.5 via gravity feeding TID with 60 ml free water before and after each bolus feeding. Please send pump to patient.  1422 mL  0  . omeprazole (PRILOSEC) 10 MG capsule Take 10 mg by mouth daily.      . ondansetron (ZOFRAN) 8 MG tablet Take 8 mg by mouth every 12 (twelve) hours as needed.      . prochlorperazine (COMPAZINE) 10 MG tablet Take 10 mg by mouth every 6 (six) hours as needed.      . prochlorperazine (COMPAZINE) 10 MG tablet TAKE 1 TABLET BY MOUTH EVERY 6 HOURS AS NEEDED FOR NAUSEA OR VOMITING  30 tablet  0  . sodium fluoride (PREVIDENT 5000 PLUS) 1.1 % CREA dental cream Apply thin ribbon of cream to tooth brush. Brush teeth for 2 minutes. Spit out excess-DO NOT swallow. DO NOT rinse afterwards. Repeat nightly.  1 Tube   prn   No current facility-administered medications for this visit.    ALLERGIES:  is allergic to codeine phosphate; morphine and related; and tape.  REVIEW OF SYSTEMS:  The rest of the 14-point review of system was negative.   Filed Vitals:   10/29/12 1204  BP: 131/88  Pulse: 83  Temp: 97.3 F (36.3 C)  Resp: 20   Wt Readings from Last 3 Encounters:  10/29/12 136 lb 14.4 oz (62.097 kg)  10/15/12 138 lb 13.1 oz (62.968 kg)  09/17/12 136 lb 11.2 oz (62.007 kg)   ECOG Performance status: 2  PHYSICAL EXAMINATION:   General: thin-appearing man, in no acute distress. Eyes:  no scleral icterus. ENT: There were no oropharyngeal lesions on my unaided exam. Neck was without thyromegaly. There was a flap in the left jaw which has healed. Right neck was fibrotic from resection. There was no erythema, purulent discharge, pain on palpation. Lymphatics: no clear palpable adenopathy. Respiratory: lungs were clear bilaterally without wheezing or crackles. Cardiovascular: Regular rate and rhythm, S1/S2, without murmur, rub or gallop. There was no pedal edema. GI: abdomen was soft, flat, nontender, nondistended, without organomegaly. PEG tube was dry, clean, intact. Muscoloskeletal: no spinal tenderness of palpation of vertebral spine. Skin exam was without echymosis, petichae. There was mild aciniform skin rash on the face, trunk, and scalp, but more on the chest.   Neuro exam was nonfocal. Patient was able to get on and off exam table without assistance. Gait was normal. Patient was alerted and oriented. Attention was good. Language was appropriate. Mood was normal without depression. Speech was not pressured. Thought content was not tangential.    LABORATORY/RADIOLOGY DATA:  Lab Results  Component Value Date   WBC 2.7* 10/29/2012   HGB 11.2* 10/29/2012   HCT 33.9* 10/29/2012   PLT 189 10/29/2012   GLUCOSE 111* 10/15/2012   CHOL 160 02/14/2008   TRIG 442* 02/14/2008   HDL 26.5* 02/14/2008   LDLDIRECT 64.8  02/14/2008   ALKPHOS 44 10/08/2012   ALT 12 10/08/2012   AST 16 10/08/2012   NA 137 10/15/2012   K 3.8 10/15/2012   CL 101 10/15/2012   CREATININE 0.6* 10/15/2012   BUN 9.4 10/15/2012   CO2 27 10/15/2012   INR 0.99 08/20/2012    ASSESSMENT AND PLAN:   1. Smoking: none at this time.   2. Recurrent left lateral tongue squamous cell carcinoma: He now has metastatic disease to the lung and bone.  This last CT scan today showed partial disease response in the left lung mass and iliac.  - He has grade 3 fatigue despite cycle modification.  I discussed with Mr. Crum and his father that the goal of therapy in his situation is palliative and not curative.  If he is having grade 3 fatigue, we should have about 1 month chemo holiday.  He is concerned of potential disease progression.  However, with continuation of chemo, his quality of life will suffer without a break.  - He expressed informed understanding and agreed to one month chemo break.  When we resume chemo, if his fatigue worsens, then we can try even further from the 60% from baseline at this time.   3.  Bone met: limited.  S/p palliative XRT.   He will continue with Zometa every 6 weeks to decrease risk of skeletal-related event when he resumes chemo mid March 2014.    4. Anemia: This is from chemo.  There is no active bleeding. No transfusion indicated.   5. Calorie/Protein malnutrition: weight is stable compared before.  Hopefully, it will get better with chemo holiday and some oral intake.   6. Mouth pain from treatment: He is on Vicodin prn.  I refilled this today.   7. Skin rash: Due to Erbitux.  He is on Cleocin cream and Hydrocortisone cream prn.  I advised him to use more cream on the rash on his chest.  He stopped Doxycycline due to potential role in axillary rash.    8.   Follow up: in about 1 month.   9.  Code status:  I had a discussion with patient and his father.  He had never dicussed  this issue with his family and would like some  time to think it over.     The length of time of the face-to-face encounter was 25 minutes. More than 50% of time was spent counseling and coordination of care.

## 2012-10-29 NOTE — Telephone Encounter (Signed)
gv and printed appt schedule for pt for March...s/w Tia to add tx.Marland KitchenMarland KitchenDone

## 2012-11-05 ENCOUNTER — Encounter: Payer: Self-pay | Admitting: Nutrition

## 2012-11-05 ENCOUNTER — Ambulatory Visit: Payer: Self-pay

## 2012-11-05 ENCOUNTER — Encounter: Payer: Self-pay | Admitting: Oncology

## 2012-11-05 NOTE — Progress Notes (Signed)
Sent bills to patient's case worker for his medicaid deductible. Ms. Thomas Bean- I faxed and received confirmation back.

## 2012-11-19 ENCOUNTER — Encounter: Payer: Self-pay | Admitting: Internal Medicine

## 2012-11-19 ENCOUNTER — Ambulatory Visit (INDEPENDENT_AMBULATORY_CARE_PROVIDER_SITE_OTHER): Payer: Self-pay | Admitting: Internal Medicine

## 2012-11-19 VITALS — BP 136/82 | Temp 97.1°F | Wt 137.0 lb

## 2012-11-19 DIAGNOSIS — F329 Major depressive disorder, single episode, unspecified: Secondary | ICD-10-CM

## 2012-11-19 MED ORDER — SERTRALINE HCL 50 MG PO TABS: 50.0000 mg | ORAL_TABLET | Freq: Every day | ORAL | Status: DC

## 2012-11-19 NOTE — Progress Notes (Signed)
Pt comes to discuss anxiety- Reviewed patient's hx related to tongue CA-- metastatic.  Reviewed recent CT scans-no disease progression.  Pt states he worries about anything and everything. States he cries frequently and has a feeling of being overwhelmed. He understnads that some of this is to be expected given his disease and prognosis.   A/P-- discussed anxiety at length-- total time 25 minutes. Discussed his current diagnosis and palliative treatment. I think we can help with anxiety.  Start zoloft and continue with lorazepam (may need to increase dose)

## 2012-11-23 ENCOUNTER — Other Ambulatory Visit: Payer: Self-pay | Admitting: Oncology

## 2012-11-26 ENCOUNTER — Ambulatory Visit (HOSPITAL_BASED_OUTPATIENT_CLINIC_OR_DEPARTMENT_OTHER): Payer: Medicaid Other

## 2012-11-26 ENCOUNTER — Ambulatory Visit: Payer: Self-pay | Admitting: Nutrition

## 2012-11-26 ENCOUNTER — Other Ambulatory Visit (HOSPITAL_BASED_OUTPATIENT_CLINIC_OR_DEPARTMENT_OTHER): Payer: Medicaid Other | Admitting: Lab

## 2012-11-26 ENCOUNTER — Encounter: Payer: Self-pay | Admitting: Oncology

## 2012-11-26 ENCOUNTER — Ambulatory Visit (HOSPITAL_BASED_OUTPATIENT_CLINIC_OR_DEPARTMENT_OTHER): Payer: Medicaid Other | Admitting: Oncology

## 2012-11-26 ENCOUNTER — Telehealth: Payer: Self-pay | Admitting: Oncology

## 2012-11-26 VITALS — BP 114/65 | HR 73 | Temp 97.7°F | Resp 20

## 2012-11-26 VITALS — BP 119/76 | HR 85 | Temp 97.4°F | Resp 20 | Ht 72.0 in | Wt 138.0 lb

## 2012-11-26 DIAGNOSIS — C7951 Secondary malignant neoplasm of bone: Secondary | ICD-10-CM

## 2012-11-26 DIAGNOSIS — C770 Secondary and unspecified malignant neoplasm of lymph nodes of head, face and neck: Secondary | ICD-10-CM

## 2012-11-26 DIAGNOSIS — C029 Malignant neoplasm of tongue, unspecified: Secondary | ICD-10-CM

## 2012-11-26 DIAGNOSIS — Z5111 Encounter for antineoplastic chemotherapy: Secondary | ICD-10-CM

## 2012-11-26 DIAGNOSIS — Z5112 Encounter for antineoplastic immunotherapy: Secondary | ICD-10-CM

## 2012-11-26 DIAGNOSIS — C7800 Secondary malignant neoplasm of unspecified lung: Secondary | ICD-10-CM

## 2012-11-26 DIAGNOSIS — T451X5A Adverse effect of antineoplastic and immunosuppressive drugs, initial encounter: Secondary | ICD-10-CM

## 2012-11-26 DIAGNOSIS — C760 Malignant neoplasm of head, face and neck: Secondary | ICD-10-CM

## 2012-11-26 LAB — CBC WITH DIFFERENTIAL/PLATELET
EOS%: 1.9 % (ref 0.0–7.0)
LYMPH%: 17.3 % (ref 14.0–49.0)
MCH: 34.1 pg — ABNORMAL HIGH (ref 27.2–33.4)
MCHC: 32.7 g/dL (ref 32.0–36.0)
MCV: 104.1 fL — ABNORMAL HIGH (ref 79.3–98.0)
MONO%: 9.1 % (ref 0.0–14.0)
Platelets: 153 10*3/uL (ref 140–400)
RBC: 3.14 10*6/uL — ABNORMAL LOW (ref 4.20–5.82)
RDW: 13.7 % (ref 11.0–14.6)

## 2012-11-26 LAB — BASIC METABOLIC PANEL (CC13)
Calcium: 9.5 mg/dL (ref 8.4–10.4)
Glucose: 82 mg/dl (ref 70–99)
Potassium: 3.8 mEq/L (ref 3.5–5.1)
Sodium: 140 mEq/L (ref 136–145)

## 2012-11-26 MED ORDER — ONDANSETRON 16 MG/50ML IVPB (CHCC)
16.0000 mg | Freq: Once | INTRAVENOUS | Status: AC
  Administered 2012-11-26: 16 mg via INTRAVENOUS

## 2012-11-26 MED ORDER — HEPARIN SOD (PORK) LOCK FLUSH 100 UNIT/ML IV SOLN
500.0000 [IU] | Freq: Once | INTRAVENOUS | Status: AC | PRN
  Administered 2012-11-26: 500 [IU]
  Filled 2012-11-26: qty 5

## 2012-11-26 MED ORDER — LORAZEPAM 0.5 MG PO TABS: 0.5000 mg | ORAL_TABLET | Freq: Four times a day (QID) | ORAL | Status: DC | PRN

## 2012-11-26 MED ORDER — HYDROCODONE-ACETAMINOPHEN 5-325 MG PO TABS: 1.0000 | ORAL_TABLET | Freq: Four times a day (QID) | ORAL | Status: DC | PRN

## 2012-11-26 MED ORDER — DEXAMETHASONE SODIUM PHOSPHATE 4 MG/ML IJ SOLN
20.0000 mg | Freq: Once | INTRAMUSCULAR | Status: AC
  Administered 2012-11-26: 20 mg via INTRAVENOUS

## 2012-11-26 MED ORDER — PACLITAXEL CHEMO INJECTION 300 MG/50ML
48.0000 mg/m2 | Freq: Once | INTRAVENOUS | Status: AC
  Administered 2012-11-26: 90 mg via INTRAVENOUS
  Filled 2012-11-26: qty 15

## 2012-11-26 MED ORDER — SODIUM CHLORIDE 0.9 % IV SOLN
180.0000 mg | Freq: Once | INTRAVENOUS | Status: AC
  Administered 2012-11-26: 180 mg via INTRAVENOUS
  Filled 2012-11-26: qty 18

## 2012-11-26 MED ORDER — ZOLEDRONIC ACID 4 MG/100ML IV SOLN
4.0000 mg | Freq: Once | INTRAVENOUS | Status: AC
  Administered 2012-11-26: 4 mg via INTRAVENOUS
  Filled 2012-11-26: qty 100

## 2012-11-26 MED ORDER — FAMOTIDINE IN NACL 20-0.9 MG/50ML-% IV SOLN
20.0000 mg | Freq: Once | INTRAVENOUS | Status: AC
  Administered 2012-11-26: 20 mg via INTRAVENOUS

## 2012-11-26 MED ORDER — CETUXIMAB CHEMO IV INJECTION 200 MG/100ML
187.5000 mg/m2 | Freq: Once | INTRAVENOUS | Status: AC
  Administered 2012-11-26: 300 mg via INTRAVENOUS
  Filled 2012-11-26: qty 150

## 2012-11-26 MED ORDER — SODIUM CHLORIDE 0.9 % IV SOLN
Freq: Once | INTRAVENOUS | Status: AC
  Administered 2012-11-26: 12:00:00 via INTRAVENOUS

## 2012-11-26 MED ORDER — PROCHLORPERAZINE MALEATE 10 MG PO TABS: 10.0000 mg | ORAL_TABLET | Freq: Four times a day (QID) | ORAL | Status: DC | PRN

## 2012-11-26 MED ORDER — CARBOPLATIN CHEMO INTRADERMAL TEST DOSE 100MCG/0.02ML
100.0000 ug | Freq: Once | INTRADERMAL | Status: AC
  Administered 2012-11-26: 100 ug via INTRADERMAL
  Filled 2012-11-26: qty 0.01

## 2012-11-26 MED ORDER — DIPHENHYDRAMINE HCL 50 MG/ML IJ SOLN
50.0000 mg | Freq: Once | INTRAMUSCULAR | Status: AC
  Administered 2012-11-26: 50 mg via INTRAVENOUS

## 2012-11-26 MED ORDER — SODIUM CHLORIDE 0.9 % IV SOLN: Freq: Once | INTRAVENOUS | Status: DC

## 2012-11-26 MED ORDER — SODIUM CHLORIDE 0.9 % IJ SOLN
10.0000 mL | INTRAMUSCULAR | Status: DC | PRN
  Administered 2012-11-26: 10 mL
  Filled 2012-11-26: qty 10

## 2012-11-26 NOTE — Progress Notes (Signed)
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 He now developed biopsy proven metastatic disease in 03/2012 with met in right neck, left lung mass and left illac.   PAST THERAPY:  1. 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 He developed recurrence in the left submandibular gland in August 2012. He underwent on 07/27/2011 radical left neck dissection by Dr. Ezzard Standing and Dr. Geryl Rankins. Pathology case number [id number] showed total 15 lymph nodes from left radical neck dissection that was all negative. However the left submandibular gland mass showed a 3.5 cm invasive SCC, moderately differentiates involving the adjacent skeletal muscle tissue and bone a tissue and extending into the inked margin. There was no evidence of any lymphatic invasion or perineural invasion. He underwent further resection by Dr. Mary Sella at [facility] on 09/29/2011.  2. He received adjuvant chemoradiation therapy with q 3 week Cisplatin given 11/15/11-12/05/11 (received 2 of 3 doses; 3rd dose held due to febrile neutropenia and development of flap abscess). He received concurrent XRT 11/15/11 through 01/05/12.  3. For right neck recurrence in 03/2012; he underwent palliative right neck dissection by Dr. Manson Passey at [facility].   CURRENT THERAPY: started on 05/15/2012 palliative chemo weekly Carboplatin/Taxol/Erbitux; 3 weeks on; 1 week off.  Due to prolonged cytopenia, his chemo has been changed to weekly; 2 weeks on, 1 week off.   INTERVAL HISTORY: Thomas Bean 52 y.o. male returns for regular follow up with his father.  He has been on a chemo holiday for 1 month due to fatigue. He reported that he has been having moderate fatigue; he is not sure that the  chemo holiday really helped. States he was started on Zoloft by PCP last week due to feeling anxious, tearful, and having obsessive thoughts. No suicidal or homicidal ideation. He thinks fatigue may be related to depression since it did improve with chemo holiday. He is independent of personal hygiene activities.  Otherwise, he spends most of his awake time sitting around.  He does not leave the house much due to his fatigue.  He has been trying to eat mechanical soft foods.  He still uses his PEG tube for Osmolytes about 5-6 cans/day.  Rash from Erbitux has resolved. He has chronic pain in the jaw due to history of treatments.  He needs Lortab on a routine basis.  He denied fever, headache, SOB, chest pain, abd pain, bleeding, neuropathy, diarrhea.  The rest of the 14-point review of system was negative.    Past Medical History  Diagnosis Date  . Hemorrhoids   . PONV (postoperative nausea and vomiting)   . Hypertension     no medications at present  . GERD (gastroesophageal reflux disease)     no medication  . Depression     treated approx 12 years ago, no treatment at this time  . Hiatal hernia   . Neutropenia 12/23/2011  . Abscess 12/27/2011  . Hx of radiation therapy 11/15/11 -01/05/12    left lateral tongue  . Lesion of left lung 03/14/12    CT chest, [facility], squamous cell carcinoma  . Facial rash 05/24/2012  . History of radiation therapy 07/24/12-07/26/12    left iliac bone, 18Gy/3 sessions  . Cancer 12/2010  left side of tongue T1 lesion excised 4/12  . Lung metastasis dx'd 03/2012  . Bone metastasis 04/25/12    PET scan-left iliac bone    Past Surgical History  Procedure Laterality Date  . Inguinal herniorrhapy    . Tonsillectomy and adenoidectomy    . Excision of tongue mass      April 2012  . Hernia repair    . Neck mass excision  07/27/2011  . Radical neck dissection  07/27/2011    Procedure: RADICAL NECK DISSECTION;  Surgeon: Drema Halon, MD;  Location: [facility] OR;   Service: ENT;  Laterality: Left;  Marland Kitchen Mandibulectomy  09/2011    and scapular free flap  . Modified radical neck dissection  03/27/12    excision of lip lesion, lip benign, right neck squamous cell ca    Current Outpatient Prescriptions  Medication Sig Dispense Refill  . clindamycin (CLINDAGEL) 1 % gel Apply topically as needed. Apply to facial and chest skin rash.      . clobetasol cream (TEMOVATE) 0.05 % Apply topically as needed. Apply to rash BID      . gabapentin (NEURONTIN) 300 MG capsule TAKE 1 CAPSULE BY MOUTH THREE TIMES DAILY  90 capsule  0  . HYDROcodone-acetaminophen (NORCO/VICODIN) 5-325 MG per tablet Take 1 tablet by mouth every 6 (six) hours as needed for pain.  120 tablet  0  . hydrocortisone cream 0.5 % Apply topically 2 (two) times daily as needed. Apply to axilla rash twice daily as needed.      . lidocaine-prilocaine (EMLA) cream Apply topically as needed. Apply to [facility] site one hour prior to needle stick to numb skin.  30 g  3  . LORazepam (ATIVAN) 0.5 MG tablet Place 1 tablet (0.5 mg total) under the tongue every 6 (six) hours as needed for anxiety (nausea/vomiting).  60 tablet  0  . Nutritional Supplements (FEEDING SUPPLEMENT, OSMOLITE 1.5 CAL,) LIQD Begin Osmolite 1.5 at 60 ml/hr for 12 hours nocturnal continuous feeding daily. Flush with 60 ml free water before and after continuous feedings. Continue 1 can of Osmolite 1.5 via gravity feeding TID with 60 ml free water before and after each bolus feeding. Please send pump to patient.  1422 mL  0  . omeprazole (PRILOSEC) 10 MG capsule Take 10 mg by mouth daily.      . ondansetron (ZOFRAN) 8 MG tablet Take 8 mg by mouth every 12 (twelve) hours as needed.      . prochlorperazine (COMPAZINE) 10 MG tablet Take 1 tablet (10 mg total) by mouth every 6 (six) hours as needed.  60 tablet  1  . sertraline (ZOLOFT) 50 MG tablet Take 1 tablet (50 mg total) by mouth daily.  30 tablet  3  . sodium fluoride (PREVIDENT 5000 PLUS) 1.1 %  CREA dental cream Apply thin ribbon of cream to tooth brush. Brush teeth for 2 minutes. Spit out excess-DO NOT swallow. DO NOT rinse afterwards. Repeat nightly.  1 Tube  prn   No current facility-administered medications for this visit.   Facility-Administered Medications Ordered in Other Visits  Medication Dose Route Frequency Provider Last Rate Last Dose  . cetuximab (ERBITUX) chemo infusion 300 mg  187.5 mg/m2 (Treatment Plan Actual) Intravenous Once Exie Parody, MD      . heparin lock flush 100 unit/mL  500 Units Intracatheter Once PRN Exie Parody, MD      . sodium chloride 0.9 % injection 10 mL  10 mL Intracatheter  PRN Exie Parody, MD      . Zoledronic Acid (ZOMETA) 4 mg IVPB  4 mg Intravenous Once Myrtis Ser, NP 400 mL/hr at 11/26/12 1224 4 mg at 11/26/12 1224    ALLERGIES:  is allergic to codeine phosphate; morphine and related; and tape.  REVIEW OF SYSTEMS:  The rest of the 14-point review of system was negative.   Filed Vitals:   11/26/12 1022  BP: 119/76  Pulse: 85  Temp: 97.4 F (36.3 C)  Resp: 20   Wt Readings from Last 3 Encounters:  11/26/12 138 lb (62.596 kg)  11/19/12 137 lb (62.143 kg)  10/29/12 136 lb 14.4 oz (62.097 kg)   ECOG Performance status: 2  PHYSICAL EXAMINATION:   General: thin-appearing man, in no acute distress. Eyes: no scleral icterus. ENT: There were no oropharyngeal lesions on my unaided exam. Neck was without thyromegaly. There was a flap in the left jaw which has healed. Right neck was fibrotic from resection. There was no erythema, purulent discharge, pain on palpation. Lymphatics: no clear palpable adenopathy. Respiratory: lungs were clear bilaterally without wheezing or crackles. Cardiovascular: Regular rate and rhythm, S1/S2, without murmur, rub or gallop. There was no pedal edema. GI: abdomen was soft, flat, nontender, nondistended, without organomegaly. PEG tube was dry, clean, intact. Muscoloskeletal: no spinal tenderness of palpation of  vertebral spine. Skin exam was without echymosis, petichae. There was mild aciniform skin rash on the face, trunk, and scalp, but more on the chest.   Neuro exam was nonfocal. Patient was able to get on and off exam table without assistance. Gait was normal. Patient was alerted and oriented. Attention was good. Language was appropriate. Mood was normal without depression. Speech was not pressured. Thought content was not tangential.    LABORATORY/RADIOLOGY DATA:  Lab Results  Component Value Date   WBC 3.6* 11/26/2012   HGB 10.7* 11/26/2012   HCT 32.7* 11/26/2012   PLT 153 11/26/2012   GLUCOSE 111* 10/15/2012   CHOL 160 02/14/2008   TRIG 442* 02/14/2008   HDL 26.5* 02/14/2008   LDLDIRECT 64.8 02/14/2008   ALKPHOS 44 10/08/2012   ALT 12 10/08/2012   AST 16 10/08/2012   NA 137 10/15/2012   K 3.8 10/15/2012   CL 101 10/15/2012   CREATININE 0.6* 10/15/2012   BUN 9.4 10/15/2012   CO2 27 10/15/2012   INR 0.99 08/20/2012    ASSESSMENT AND PLAN:   1. Smoking: none at this time.   2. Recurrent left lateral tongue squamous cell carcinoma: He now has metastatic disease to the lung and bone.   - He has grade 2 fatigue today and is ready to resume chemo. I discussed with Mr. Ballow and his father that the goal of therapy in his situation is palliative and not curative.   - If fatigue worsens of chemo,  then we can try even further from the 60% from baseline at this time. Patient does not wish to reduce the dose further at this time as he believes the fatigue is due to depression.  3.  Bone met: limited.  S/p palliative XRT.   He will continue with Zometa every 6 weeks to decrease risk of skeletal-related event when he resumes chemo mid March 2014.    4. Anemia: This is from chemo.  There is no active bleeding. No transfusion indicated.   5. Calorie/Protein malnutrition: weight is stable compared before.  Hopefully, it will get better with chemo holiday and some oral intake.  6. Mouth pain from treatment: He is on  Vicodin prn.  I refilled this today.   7. Skin rash: Due to Erbitux.  He is on Cleocin cream and Hydrocortisone cream prn.  I advised him to use more cream on the rash on his chest.  He stopped Doxycycline due to potential role in axillary rash.    8. Depression and Anxiety: Recently started on Zoloft per PCP. Using Ativan as needed for anxiety. I have made a referral to our SW to see if therre are any services we can offer him here at the cancer center. The patient is receptive to this.  9.   Follow up: lab and chemo in 1 week. Lab, visit, chemo in 3 weeks.  10.  Code status:  Patient is still thinking this over.    The length of time of the face-to-face encounter was 25 minutes. More than 50% of time was spent counseling and coordination of care.

## 2012-11-26 NOTE — Progress Notes (Signed)
Patient reports weight has increased to 138 pounds from 136.7 pounds January 6. He continues to eat by mouth and use Osmolite 1.5, six cans daily, through his feeding tube. He has no questions today.  Nutrition diagnosis: Inadequate oral intake and inadequate enteral nutrition infusion have improved.  Intervention: Patient was educated to continue current strategies for oral intake and tube feedings.  Monitoring, evaluation, goals: Patient is tolerating oral intake plus tube feedings to minimize weight loss.  Next visit: I have not scheduled a followup at this time. I will continue to be available for patient as needed.

## 2012-11-27 ENCOUNTER — Encounter: Payer: Self-pay | Admitting: *Deleted

## 2012-11-27 ENCOUNTER — Encounter: Payer: Self-pay | Admitting: Oncology

## 2012-11-27 NOTE — Progress Notes (Signed)
[facility] Clinical Social Work  Clinical Social Work was referred by Clenton Pare, NP, to assess for psychosocial needs. CSW met with patient in infusion room today.  [name] states he is feeling "down" and was open to receiving counseling services, but would contact CSW if he would like set up appointment with a counselor.  CSW also reviewed support programs and encouraged patient to contact CSW with any additional questions or concerns.  Kathrin Penner, MSW, LCSW Clinical Social Worker [facility] [phone]

## 2012-11-28 ENCOUNTER — Other Ambulatory Visit: Payer: Self-pay | Admitting: Certified Registered Nurse Anesthetist

## 2012-12-03 ENCOUNTER — Other Ambulatory Visit: Payer: Self-pay | Admitting: Oncology

## 2012-12-03 ENCOUNTER — Ambulatory Visit (HOSPITAL_BASED_OUTPATIENT_CLINIC_OR_DEPARTMENT_OTHER): Payer: Medicaid Other

## 2012-12-03 ENCOUNTER — Other Ambulatory Visit (HOSPITAL_BASED_OUTPATIENT_CLINIC_OR_DEPARTMENT_OTHER): Payer: Medicaid Other | Admitting: Lab

## 2012-12-03 DIAGNOSIS — C7952 Secondary malignant neoplasm of bone marrow: Secondary | ICD-10-CM

## 2012-12-03 DIAGNOSIS — C029 Malignant neoplasm of tongue, unspecified: Secondary | ICD-10-CM

## 2012-12-03 DIAGNOSIS — C7951 Secondary malignant neoplasm of bone: Secondary | ICD-10-CM

## 2012-12-03 DIAGNOSIS — Z5112 Encounter for antineoplastic immunotherapy: Secondary | ICD-10-CM

## 2012-12-03 DIAGNOSIS — Z5111 Encounter for antineoplastic chemotherapy: Secondary | ICD-10-CM

## 2012-12-03 LAB — CBC WITH DIFFERENTIAL/PLATELET
Eosinophils Absolute: 0.1 10*3/uL (ref 0.0–0.5)
LYMPH%: 15.7 % (ref 14.0–49.0)
MCH: 33.9 pg — ABNORMAL HIGH (ref 27.2–33.4)
MCV: 102.8 fL — ABNORMAL HIGH (ref 79.3–98.0)
MONO%: 6.5 % (ref 0.0–14.0)
NEUT#: 3.6 10*3/uL (ref 1.5–6.5)
Platelets: 161 10*3/uL (ref 140–400)
RBC: 3.22 10*6/uL — ABNORMAL LOW (ref 4.20–5.82)
nRBC: 0 % (ref 0–0)

## 2012-12-03 LAB — BASIC METABOLIC PANEL (CC13)
CO2: 29 mEq/L (ref 22–29)
Calcium: 9.6 mg/dL (ref 8.4–10.4)
Creatinine: 0.6 mg/dL — ABNORMAL LOW (ref 0.7–1.3)
Glucose: 103 mg/dl — ABNORMAL HIGH (ref 70–99)

## 2012-12-03 LAB — MAGNESIUM (CC13): Magnesium: 2.5 mg/dl (ref 1.5–2.5)

## 2012-12-03 MED ORDER — SODIUM CHLORIDE 0.9 % IV SOLN
48.0000 mg/m2 | Freq: Once | INTRAVENOUS | Status: AC
  Administered 2012-12-03: 90 mg via INTRAVENOUS
  Filled 2012-12-03: qty 15

## 2012-12-03 MED ORDER — SODIUM CHLORIDE 0.9 % IJ SOLN
10.0000 mL | INTRAMUSCULAR | Status: DC | PRN
  Administered 2012-12-03: 10 mL
  Filled 2012-12-03: qty 10

## 2012-12-03 MED ORDER — SODIUM CHLORIDE 0.9 % IV SOLN
Freq: Once | INTRAVENOUS | Status: AC
  Administered 2012-12-03: 11:00:00 via INTRAVENOUS

## 2012-12-03 MED ORDER — CETUXIMAB CHEMO IV INJECTION 200 MG/100ML
187.5000 mg/m2 | Freq: Once | INTRAVENOUS | Status: AC
  Administered 2012-12-03: 300 mg via INTRAVENOUS
  Filled 2012-12-03: qty 150

## 2012-12-03 MED ORDER — SODIUM CHLORIDE 0.9 % IJ SOLN
10.0000 mL | INTRAMUSCULAR | Status: DC | PRN
  Filled 2012-12-03: qty 10

## 2012-12-03 MED ORDER — SODIUM CHLORIDE 0.9 % IV SOLN: Freq: Once | INTRAVENOUS | Status: DC

## 2012-12-03 MED ORDER — SODIUM CHLORIDE 0.9 % IV SOLN
180.0000 mg | Freq: Once | INTRAVENOUS | Status: AC
  Administered 2012-12-03: 180 mg via INTRAVENOUS
  Filled 2012-12-03: qty 18

## 2012-12-03 MED ORDER — HEPARIN SOD (PORK) LOCK FLUSH 100 UNIT/ML IV SOLN
500.0000 [IU] | Freq: Once | INTRAVENOUS | Status: AC | PRN
  Administered 2012-12-03: 500 [IU]
  Filled 2012-12-03: qty 5

## 2012-12-03 MED ORDER — DEXAMETHASONE SODIUM PHOSPHATE 4 MG/ML IJ SOLN
20.0000 mg | Freq: Once | INTRAMUSCULAR | Status: AC
  Administered 2012-12-03: 20 mg via INTRAVENOUS

## 2012-12-03 MED ORDER — DIPHENHYDRAMINE HCL 50 MG/ML IJ SOLN
50.0000 mg | Freq: Once | INTRAMUSCULAR | Status: AC
  Administered 2012-12-03: 50 mg via INTRAVENOUS

## 2012-12-03 MED ORDER — FAMOTIDINE IN NACL 20-0.9 MG/50ML-% IV SOLN
20.0000 mg | Freq: Once | INTRAVENOUS | Status: AC
  Administered 2012-12-03: 20 mg via INTRAVENOUS

## 2012-12-03 MED ORDER — CARBOPLATIN CHEMO INTRADERMAL TEST DOSE 100MCG/0.02ML
100.0000 ug | Freq: Once | INTRADERMAL | Status: AC
  Administered 2012-12-03: 100 ug via INTRADERMAL
  Filled 2012-12-03: qty 0.01

## 2012-12-03 MED ORDER — HEPARIN SOD (PORK) LOCK FLUSH 100 UNIT/ML IV SOLN
500.0000 [IU] | Freq: Once | INTRAVENOUS | Status: DC | PRN
  Filled 2012-12-03: qty 5

## 2012-12-03 MED ORDER — ONDANSETRON 16 MG/50ML IVPB (CHCC)
16.0000 mg | Freq: Once | INTRAVENOUS | Status: AC
  Administered 2012-12-03: 16 mg via INTRAVENOUS

## 2012-12-03 NOTE — Patient Instructions (Addendum)
 Cancer Center Discharge Instructions for Patients Receiving Chemotherapy  Today you received the following chemotherapy agents: erbitux, taxol, carboplatin  To help prevent nausea and vomiting after your treatment, we encourage you to take your nausea medication.  Take it as often as prescribed.     If you develop nausea and vomiting that is not controlled by your nausea medication, call the clinic. If it is after clinic hours your family physician or the after hours number for the clinic or go to the Emergency Department.   BELOW ARE SYMPTOMS THAT SHOULD BE REPORTED IMMEDIATELY:  *FEVER GREATER THAN 100.5 F  *CHILLS WITH OR WITHOUT FEVER  NAUSEA AND VOMITING THAT IS NOT CONTROLLED WITH YOUR NAUSEA MEDICATION  *UNUSUAL SHORTNESS OF BREATH  *UNUSUAL BRUISING OR BLEEDING  TENDERNESS IN MOUTH AND THROAT WITH OR WITHOUT PRESENCE OF ULCERS  *URINARY PROBLEMS  *BOWEL PROBLEMS  UNUSUAL RASH Items with * indicate a potential emergency and should be followed up as soon as possible.  Feel free to call the clinic you have any questions or concerns. The clinic phone number is [phone].   I have been informed and understand all the instructions given to me. I know to contact the clinic, my physician, or go to the Emergency Department if any problems should occur. I do not have any questions at this time, but understand that I may call the clinic during office hours   should I have any questions or need assistance in obtaining follow up care.    __________________________________________  _____________  __________ Signature of Patient or Authorized Representative            Date                   Time    __________________________________________ Nurse's Signature

## 2012-12-04 ENCOUNTER — Encounter: Payer: Self-pay | Admitting: Oncology

## 2012-12-04 ENCOUNTER — Telehealth: Payer: Self-pay | Admitting: *Deleted

## 2012-12-04 NOTE — Telephone Encounter (Signed)
Received email from pt about his schedule.  Sent new POF to scheduling to update pt's schedule.  Left VM for pt that new order sent and he is NOT due for chemo on 3/31 as scheduled.  His next appt should be on 4/07.

## 2012-12-06 ENCOUNTER — Telehealth: Payer: Self-pay | Admitting: *Deleted

## 2012-12-06 NOTE — Telephone Encounter (Signed)
Per staff message and POF I have scheduled appts.  JMW  

## 2012-12-10 ENCOUNTER — Ambulatory Visit: Payer: Self-pay

## 2012-12-10 ENCOUNTER — Other Ambulatory Visit: Payer: Self-pay | Admitting: Lab

## 2012-12-17 ENCOUNTER — Ambulatory Visit (HOSPITAL_BASED_OUTPATIENT_CLINIC_OR_DEPARTMENT_OTHER): Payer: Medicaid Other

## 2012-12-17 ENCOUNTER — Ambulatory Visit (HOSPITAL_BASED_OUTPATIENT_CLINIC_OR_DEPARTMENT_OTHER): Payer: Medicaid Other | Admitting: Oncology

## 2012-12-17 ENCOUNTER — Telehealth: Payer: Self-pay | Admitting: Oncology

## 2012-12-17 ENCOUNTER — Other Ambulatory Visit (HOSPITAL_BASED_OUTPATIENT_CLINIC_OR_DEPARTMENT_OTHER): Payer: Medicaid Other | Admitting: Lab

## 2012-12-17 ENCOUNTER — Other Ambulatory Visit: Payer: Self-pay | Admitting: Lab

## 2012-12-17 VITALS — BP 119/76 | HR 103 | Temp 97.8°F | Resp 20 | Ht 72.0 in | Wt 136.8 lb

## 2012-12-17 DIAGNOSIS — C7800 Secondary malignant neoplasm of unspecified lung: Secondary | ICD-10-CM

## 2012-12-17 DIAGNOSIS — Z5112 Encounter for antineoplastic immunotherapy: Secondary | ICD-10-CM

## 2012-12-17 DIAGNOSIS — K1230 Oral mucositis (ulcerative), unspecified: Secondary | ICD-10-CM

## 2012-12-17 DIAGNOSIS — C7951 Secondary malignant neoplasm of bone: Secondary | ICD-10-CM

## 2012-12-17 DIAGNOSIS — F329 Major depressive disorder, single episode, unspecified: Secondary | ICD-10-CM

## 2012-12-17 DIAGNOSIS — C770 Secondary and unspecified malignant neoplasm of lymph nodes of head, face and neck: Secondary | ICD-10-CM

## 2012-12-17 DIAGNOSIS — Z5111 Encounter for antineoplastic chemotherapy: Secondary | ICD-10-CM

## 2012-12-17 DIAGNOSIS — G629 Polyneuropathy, unspecified: Secondary | ICD-10-CM

## 2012-12-17 DIAGNOSIS — C029 Malignant neoplasm of tongue, unspecified: Secondary | ICD-10-CM

## 2012-12-17 DIAGNOSIS — T451X5A Adverse effect of antineoplastic and immunosuppressive drugs, initial encounter: Secondary | ICD-10-CM

## 2012-12-17 DIAGNOSIS — D6481 Anemia due to antineoplastic chemotherapy: Secondary | ICD-10-CM

## 2012-12-17 DIAGNOSIS — F419 Anxiety disorder, unspecified: Secondary | ICD-10-CM

## 2012-12-17 LAB — BASIC METABOLIC PANEL (CC13)
BUN: 8 mg/dL (ref 7.0–26.0)
CO2: 28 mEq/L (ref 22–29)
Chloride: 103 mEq/L (ref 98–107)
Glucose: 105 mg/dl — ABNORMAL HIGH (ref 70–99)
Potassium: 3.5 mEq/L (ref 3.5–5.1)

## 2012-12-17 LAB — CBC WITH DIFFERENTIAL/PLATELET
BASO%: 0.2 % (ref 0.0–2.0)
Basophils Absolute: 0 10*3/uL (ref 0.0–0.1)
Eosinophils Absolute: 0 10*3/uL (ref 0.0–0.5)
HCT: 31.9 % — ABNORMAL LOW (ref 38.4–49.9)
HGB: 10.5 g/dL — ABNORMAL LOW (ref 13.0–17.1)
MONO#: 0.5 10*3/uL (ref 0.1–0.9)
NEUT#: 4.2 10*3/uL (ref 1.5–6.5)
NEUT%: 77.3 % — ABNORMAL HIGH (ref 39.0–75.0)
WBC: 5.4 10*3/uL (ref 4.0–10.3)
lymph#: 0.7 10*3/uL — ABNORMAL LOW (ref 0.9–3.3)

## 2012-12-17 MED ORDER — CARBOPLATIN CHEMO INTRADERMAL TEST DOSE 100MCG/0.02ML
100.0000 ug | Freq: Once | INTRADERMAL | Status: AC
  Administered 2012-12-17: 100 ug via INTRADERMAL
  Filled 2012-12-17: qty 0.01

## 2012-12-17 MED ORDER — SODIUM CHLORIDE 0.9 % IV SOLN
Freq: Once | INTRAVENOUS | Status: AC
  Administered 2012-12-17: 11:00:00 via INTRAVENOUS

## 2012-12-17 MED ORDER — CETUXIMAB CHEMO IV INJECTION 200 MG/100ML
166.6667 mg/m2 | Freq: Once | INTRAVENOUS | Status: AC
  Administered 2012-12-17: 300 mg via INTRAVENOUS
  Filled 2012-12-17: qty 150

## 2012-12-17 MED ORDER — HYDROCODONE-ACETAMINOPHEN 5-325 MG PO TABS: 1.0000 | ORAL_TABLET | Freq: Four times a day (QID) | ORAL | Status: DC | PRN

## 2012-12-17 MED ORDER — GABAPENTIN 300 MG PO CAPS: 300.0000 mg | ORAL_CAPSULE | Freq: Three times a day (TID) | ORAL | Status: DC

## 2012-12-17 MED ORDER — LORAZEPAM 0.5 MG PO TABS: 0.5000 mg | ORAL_TABLET | Freq: Four times a day (QID) | ORAL | Status: DC | PRN

## 2012-12-17 MED ORDER — SODIUM CHLORIDE 0.9 % IJ SOLN
10.0000 mL | INTRAMUSCULAR | Status: DC | PRN
  Administered 2012-12-17: 10 mL
  Filled 2012-12-17: qty 10

## 2012-12-17 MED ORDER — DEXAMETHASONE SODIUM PHOSPHATE 4 MG/ML IJ SOLN
20.0000 mg | Freq: Once | INTRAMUSCULAR | Status: AC
  Administered 2012-12-17: 20 mg via INTRAVENOUS

## 2012-12-17 MED ORDER — ONDANSETRON 16 MG/50ML IVPB (CHCC)
16.0000 mg | Freq: Once | INTRAVENOUS | Status: AC
  Administered 2012-12-17: 16 mg via INTRAVENOUS

## 2012-12-17 MED ORDER — SODIUM CHLORIDE 0.9 % IV SOLN
48.0000 mg/m2 | Freq: Once | INTRAVENOUS | Status: AC
  Administered 2012-12-17: 90 mg via INTRAVENOUS
  Filled 2012-12-17: qty 15

## 2012-12-17 MED ORDER — SERTRALINE HCL 50 MG PO TABS: 75.0000 mg | ORAL_TABLET | Freq: Every day | ORAL | Status: DC

## 2012-12-17 MED ORDER — DIPHENHYDRAMINE HCL 50 MG/ML IJ SOLN
50.0000 mg | Freq: Once | INTRAMUSCULAR | Status: AC
  Administered 2012-12-17: 50 mg via INTRAVENOUS

## 2012-12-17 MED ORDER — SODIUM CHLORIDE 0.9 % IV SOLN
180.0000 mg | Freq: Once | INTRAVENOUS | Status: AC
  Administered 2012-12-17: 180 mg via INTRAVENOUS
  Filled 2012-12-17: qty 18

## 2012-12-17 MED ORDER — HEPARIN SOD (PORK) LOCK FLUSH 100 UNIT/ML IV SOLN
500.0000 [IU] | Freq: Once | INTRAVENOUS | Status: AC | PRN
  Administered 2012-12-17: 500 [IU]
  Filled 2012-12-17: qty 5

## 2012-12-17 MED ORDER — FAMOTIDINE IN NACL 20-0.9 MG/50ML-% IV SOLN
20.0000 mg | Freq: Once | INTRAVENOUS | Status: AC
  Administered 2012-12-17: 20 mg via INTRAVENOUS

## 2012-12-17 NOTE — Progress Notes (Signed)
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 He now developed biopsy proven metastatic disease in 03/2012 with met in right neck, left lung mass and left illac.   PAST THERAPY:  1. 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 He developed recurrence in the left submandibular gland in August 2012. He underwent on 07/27/2011 radical left neck dissection by Dr. Ezzard Standing and Dr. Geryl Rankins. Pathology case number [id number] showed total 15 lymph nodes from left radical neck dissection that was all negative. However the left submandibular gland mass showed a 3.5 cm invasive SCC, moderately differentiates involving the adjacent skeletal muscle tissue and bone a tissue and extending into the inked margin. There was no evidence of any lymphatic invasion or perineural invasion. He underwent further resection by Dr. Mary Sella at [facility] on 09/29/2011.  2. He received adjuvant chemoradiation therapy with q 3 week Cisplatin given 11/15/11-12/05/11 (received 2 of 3 doses; 3rd dose held due to febrile neutropenia and development of flap abscess). He received concurrent XRT 11/15/11 through 01/05/12.  3. For right neck recurrence in 03/2012; he underwent palliative right neck dissection by Dr. Manson Passey at [facility].   CURRENT THERAPY: started on 05/15/2012 palliative chemo weekly Carboplatin/Taxol/Erbitux; 3 weeks on; 1 week off.  Due to prolonged cytopenia, his chemo has been changed to weekly; 2 weeks on, 1 week off.   INTERVAL HISTORY: Thomas Bean 52 y.o. male returns for regular follow up with his father.  He had some relief of skin rash being off of Erbitux.  However, when was it resumed 3 weeks ago, he had transient worsening of the facial and  chest skin rash.  He has been taking Cleocin and hydrocortisone cream.  The skin rash is not pruritic and no skin breakthrough.  He has mild to moderate fatigue.  He is still independent of activities of daily living.  He is able to fix his own tube feed, and shake.  However, he does not do much other chores around the house.  He is depressed and anxious; better than before starting Zoloft.  However, there are things going on between him and his son which stresses him out.  He is planning to meet a therapist.  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  He still has moderate to severe jaw pain from past head/neck surgery, and he needs Lortab elixir about 3-4x/day.   He denied fever, mucositis, cough, SOB, chest pain, abd pain, bleeding.  The rest of the 14 point review of system was negative.    Past Medical History  Diagnosis Date  . Hemorrhoids   . PONV (postoperative nausea and vomiting)   . Hypertension     no medications at present  . GERD (gastroesophageal reflux disease)     no medication  . Depression     treated approx 12 years ago, no treatment at this time  . Hiatal hernia   . Neutropenia 12/23/2011  . Abscess 12/27/2011  . Hx of radiation therapy 11/15/11 -01/05/12    left lateral tongue  . Lesion of left lung 03/14/12    CT chest, [facility], squamous cell carcinoma  . Facial rash 05/24/2012  . History of radiation therapy 07/24/12-07/26/12    left iliac bone,  18Gy/3 sessions  . Cancer 12/2010    left side of tongue T1 lesion excised 4/12  . Lung metastasis dx'd 03/2012  . Bone metastasis 04/25/12    PET scan-left iliac bone    Past Surgical History  Procedure Laterality Date  . Inguinal herniorrhapy    . Tonsillectomy and adenoidectomy    . Excision of tongue mass      April 2012  . Hernia repair    . Neck mass excision  07/27/2011  . Radical neck dissection  07/27/2011    Procedure: RADICAL NECK DISSECTION;  Surgeon: Drema Halon, MD;   Location: [facility] OR;  Service: ENT;  Laterality: Left;  Marland Kitchen Mandibulectomy  09/2011    and scapular free flap  . Modified radical neck dissection  03/27/12    excision of lip lesion, lip benign, right neck squamous cell ca    Current Outpatient Prescriptions  Medication Sig Dispense Refill  . clindamycin (CLINDAGEL) 1 % gel Apply topically as needed. Apply to facial and chest skin rash.      . clobetasol cream (TEMOVATE) 0.05 % Apply topically as needed. Apply to rash BID      . gabapentin (NEURONTIN) 300 MG capsule Take 1 capsule (300 mg total) by mouth 3 (three) times daily.  90 capsule  3  . HYDROcodone-acetaminophen (NORCO/VICODIN) 5-325 MG per tablet Take 1 tablet by mouth every 6 (six) hours as needed for pain.  120 tablet  0  . hydrocortisone cream 0.5 % Apply topically 2 (two) times daily as needed. Apply to axilla rash twice daily as needed.      . lidocaine-prilocaine (EMLA) cream Apply topically as needed. Apply to [facility] site one hour prior to needle stick to numb skin.  30 g  3  . LORazepam (ATIVAN) 0.5 MG tablet Place 1 tablet (0.5 mg total) under the tongue every 6 (six) hours as needed for anxiety (nausea/vomiting).  60 tablet  0  . Nutritional Supplements (FEEDING SUPPLEMENT, OSMOLITE 1.5 CAL,) LIQD Begin Osmolite 1.5 at 60 ml/hr for 12 hours nocturnal continuous feeding daily. Flush with 60 ml free water before and after continuous feedings. Continue 1 can of Osmolite 1.5 via gravity feeding TID with 60 ml free water before and after each bolus feeding. Please send pump to patient.  1422 mL  0  . omeprazole (PRILOSEC) 10 MG capsule Take 10 mg by mouth daily.      . ondansetron (ZOFRAN) 8 MG tablet Take 8 mg by mouth every 12 (twelve) hours as needed.      . prochlorperazine (COMPAZINE) 10 MG tablet Take 1 tablet (10 mg total) by mouth every 6 (six) hours as needed.  60 tablet  1  . sertraline (ZOLOFT) 50 MG tablet Take 1.5 tablets (75 mg total) by mouth daily.  60 tablet  2  .  sodium fluoride (PREVIDENT 5000 PLUS) 1.1 % CREA dental cream Apply thin ribbon of cream to tooth brush. Brush teeth for 2 minutes. Spit out excess-DO NOT swallow. DO NOT rinse afterwards. Repeat nightly.  1 Tube  prn   No current facility-administered medications for this visit.   Facility-Administered Medications Ordered in Other Visits  Medication Dose Route Frequency Provider Last Rate Last Dose  . sodium chloride 0.9 % injection 10 mL  10 mL Intracatheter PRN Exie Parody, MD   10 mL at 12/17/12 1521    ALLERGIES:  is allergic to codeine phosphate; morphine and related; and tape.  Filed Vitals:  12/17/12 0906  BP: 119/76  Pulse: 103  Temp: 97.8 F (36.6 C)  Resp: 20   Wt Readings from Last 3 Encounters:  12/17/12 136 lb 12.8 oz (62.052 kg)  11/26/12 138 lb (62.596 kg)  11/19/12 137 lb (62.143 kg)   ECOG Performance status: 1-2  PHYSICAL EXAMINATION:   General: thin-appearing man, in no acute distress. Eyes: no scleral icterus. ENT: There were no oropharyngeal lesions on my unaided exam. Neck was without thyromegaly. There was a flap in the left jaw which has healed. Right neck was fibrotic from resection. There was no erythema, purulent discharge, pain on palpation. Lymphatics: no clear palpable adenopathy. Respiratory: lungs were clear bilaterally without wheezing or crackles. Cardiovascular: Regular rate and rhythm, S1/S2, without murmur, rub or gallop. There was no pedal edema. GI: abdomen was soft, flat, nontender, nondistended, without organomegaly. PEG tube was dry, clean, intact. Muscoloskeletal: no spinal tenderness of palpation of vertebral spine. Skin exam was without echymosis, petichae. There was mild aciniform skin rash on the face, scalp, and chest.   Neuro exam was nonfocal. Patient was able to get on and off exam table without assistance. Gait was normal. Patient was alerted and oriented. Attention was good. Language was appropriate. Mood was normal without depression.  Speech was not pressured. Thought content was not tangential.    LABORATORY/RADIOLOGY DATA:  Lab Results  Component Value Date   WBC 5.4 12/17/2012   HGB 10.5* 12/17/2012   HCT 31.9* 12/17/2012   PLT 220 12/17/2012   GLUCOSE 105* 12/17/2012   CHOL 160 02/14/2008   TRIG 442* 02/14/2008   HDL 26.5* 02/14/2008   LDLDIRECT 64.8 02/14/2008   ALKPHOS 44 10/08/2012   ALT 12 10/08/2012   AST 16 10/08/2012   NA 139 12/17/2012   K 3.5 12/17/2012   CL 103 12/17/2012   CREATININE 0.6* 12/17/2012   BUN 8.0 12/17/2012   CO2 28 12/17/2012   INR 0.99 08/20/2012    ASSESSMENT AND PLAN:   1. Smoking: none at this time.   2. Recurrent left lateral tongue squamous cell carcinoma: He now has metastatic disease to the lung and bone.  This last CT scan today showed partial disease response in the left lung mass and iliac bone.  - He has grade 2-3 skin rash.  I recommended continuing with chemo with Carboplatin/Taxol/Erbitux but further dose reduction of Erbitux from 75% to 66% of baseline.   - I referred him to [facility] with Med Onc Dr. Elaina Pattee to see if patient has any targetable mutation if and when he progresses through the current therapy.   3.  Bone met: limited.  S/p palliative XRT.   He will continue with Zometa every 6 weeks to decrease risk of skeletal-related event when he resumes chemo mid March 2014.    4. Anemia: This is from chemo.  There is no active bleeding. No transfusion indicated.   5. Calorie/Protein malnutrition: weight is stable compared before.  6. Mouth pain from treatment: He is on Vicodin prn.  I again refilled this today.   7. Skin rash: Due to Erbitux.  I advised him to continue Cleocin cream and Hydrocortisone cream prn. Hopefully with the dose reduction in Erbitux, his rash will improved.  If not worsens in the future, we may consider holding off on Erbitux and only continue on Carboplatin/Taxol.  He stopped Doxycycline due to potential role in severe axillary rash in the past.    8.    Follow up: in  about 3 weeks before the next cycle of chemo.       The length of time of the face-to-face encounter was 25 minutes. More than 50% of time was spent counseling and coordination of care.        Madilyne Tadlock T. Gaylyn Rong, M.D.

## 2012-12-17 NOTE — Patient Instructions (Addendum)
Paclitaxel injection What is this medicine? PACLITAXEL (PAK li TAX el) is a chemotherapy drug. It targets fast dividing cells, like cancer cells, and causes these cells to die. This medicine is used to treat ovarian cancer, breast cancer, and other cancers. This medicine may be used for other purposes; ask your health care provider or pharmacist if you have questions. What should I tell my health care provider before I take this medicine? They need to know if you have any of these conditions: -blood disorders -irregular heartbeat -infection (especially a virus infection such as chickenpox, cold sores, or herpes) -liver disease -previous or ongoing radiation therapy -an unusual or allergic reaction to paclitaxel, alcohol, polyoxyethylated castor oil, other chemotherapy agents, other medicines, foods, dyes, or preservatives -pregnant or trying to get pregnant -breast-feeding How should I use this medicine? This drug is given as an infusion into a vein. It is administered in a hospital or clinic by a specially trained health care professional. Talk to your pediatrician regarding the use of this medicine in children. Special care may be needed. Overdosage: If you think you have taken too much of this medicine contact a poison control center or emergency room at once. NOTE: This medicine is only for you. Do not share this medicine with others. What if I miss a dose? It is important not to miss your dose. Call your doctor or health care professional if you are unable to keep an appointment. What may interact with this medicine? Do not take this medicine with any of the following medications: -disulfiram -metronidazole This medicine may also interact with the following medications: -cyclosporine -dexamethasone -diazepam -ketoconazole -medicines to increase blood counts like filgrastim, pegfilgrastim, sargramostim -other chemotherapy drugs like cisplatin, doxorubicin, epirubicin, etoposide,  teniposide, vincristine -quinidine -testosterone -vaccines -verapamil Talk to your doctor or health care professional before taking any of these medicines: -acetaminophen -aspirin -ibuprofen -ketoprofen -naproxen This list may not describe all possible interactions. Give your health care provider a list of all the medicines, herbs, non-prescription drugs, or dietary supplements you use. Also tell them if you smoke, drink alcohol, or use illegal drugs. Some items may interact with your medicine. What should I watch for while using this medicine? Your condition will be monitored carefully while you are receiving this medicine. You will need important blood work done while you are taking this medicine. This drug may make you feel generally unwell. This is not uncommon, as chemotherapy can affect healthy cells as well as cancer cells. Report any side effects. Continue your course of treatment even though you feel ill unless your doctor tells you to stop. In some cases, you may be given additional medicines to help with side effects. Follow all directions for their use. Call your doctor or health care professional for advice if you get a fever, chills or sore throat, or other symptoms of a cold or flu. Do not treat yourself. This drug decreases your body's ability to fight infections. Try to avoid being around people who are sick. This medicine may increase your risk to bruise or bleed. Call your doctor or health care professional if you notice any unusual bleeding. Be careful brushing and flossing your teeth or using a toothpick because you may get an infection or bleed more easily. If you have any dental work done, tell your dentist you are receiving this medicine. Avoid taking products that contain aspirin, acetaminophen, ibuprofen, naproxen, or ketoprofen unless instructed by your doctor. These medicines may hide a fever. Do not become pregnant  while taking this medicine. Women should inform their  doctor if they wish to become pregnant or think they might be pregnant. There is a potential for serious side effects to an unborn child. Talk to your health care professional or pharmacist for more information. Do not breast-feed an infant while taking this medicine. Men are advised not to father a child while receiving this medicine. What side effects may I notice from receiving this medicine? Side effects that you should report to your doctor or health care professional as soon as possible: -allergic reactions like skin rash, itching or hives, swelling of the face, lips, or tongue -low blood counts - This drug may decrease the number of white blood cells, red blood cells and platelets. You may be at increased risk for infections and bleeding. -signs of infection - fever or chills, cough, sore throat, pain or difficulty passing urine -signs of decreased platelets or bleeding - bruising, pinpoint red spots on the skin, black, tarry stools, nosebleeds -signs of decreased red blood cells - unusually weak or tired, fainting spells, lightheadedness -breathing problems -chest pain -high or low blood pressure -mouth sores -nausea and vomiting -pain, swelling, redness or irritation at the injection site -pain, tingling, numbness in the hands or feet -slow or irregular heartbeat -swelling of the ankle, feet, hands Side effects that usually do not require medical attention (report to your doctor or health care professional if they continue or are bothersome): -bone pain -complete hair loss including hair on your head, underarms, pubic hair, eyebrows, and eyelashes -changes in the color of fingernails -diarrhea -loosening of the fingernails -loss of appetite -muscle or joint pain -red flush to skin -sweating This list may not describe all possible side effects. Call your doctor for medical advice about side effects. You may report side effects to FDA at 1-800-FDA-1088. Where should I keep my  medicine? This drug is given in a hospital or clinic and will not be stored at home. NOTE: This sheet is a summary. It may not cover all possible information. If you have questions about this medicine, talk to your doctor, pharmacist, or health care provider.  2013, [employer]. (08/11/2008 11:54:26 AM) Carboplatin injection What is this medicine? CARBOPLATIN (KAR boe pla tin) is a chemotherapy drug. It targets fast dividing cells, like cancer cells, and causes these cells to die. This medicine is used to treat ovarian cancer and many other cancers. This medicine may be used for other purposes; ask your health care provider or pharmacist if you have questions. What should I tell my health care provider before I take this medicine? They need to know if you have any of these conditions: -blood disorders -hearing problems -kidney disease -recent or ongoing radiation therapy -an unusual or allergic reaction to carboplatin, cisplatin, other chemotherapy, other medicines, foods, dyes, or preservatives -pregnant or trying to get pregnant -breast-feeding How should I use this medicine? This drug is usually given as an infusion into a vein. It is administered in a hospital or clinic by a specially trained health care professional. Talk to your pediatrician regarding the use of this medicine in children. Special care may be needed. Overdosage: If you think you have taken too much of this medicine contact a poison control center or emergency room at once. NOTE: This medicine is only for you. Do not share this medicine with others. What if I miss a dose? It is important not to miss a dose. Call your doctor or health care professional if you are  unable to keep an appointment. What may interact with this medicine? -medicines for seizures -medicines to increase blood counts like filgrastim, pegfilgrastim, sargramostim -some antibiotics like amikacin, gentamicin, neomycin, streptomycin,  tobramycin -vaccines Talk to your doctor or health care professional before taking any of these medicines: -acetaminophen -aspirin -ibuprofen -ketoprofen -naproxen This list may not describe all possible interactions. Give your health care provider a list of all the medicines, herbs, non-prescription drugs, or dietary supplements you use. Also tell them if you smoke, drink alcohol, or use illegal drugs. Some items may interact with your medicine. What should I watch for while using this medicine? Your condition will be monitored carefully while you are receiving this medicine. You will need important blood work done while you are taking this medicine. This drug may make you feel generally unwell. This is not uncommon, as chemotherapy can affect healthy cells as well as cancer cells. Report any side effects. Continue your course of treatment even though you feel ill unless your doctor tells you to stop. In some cases, you may be given additional medicines to help with side effects. Follow all directions for their use. Call your doctor or health care professional for advice if you get a fever, chills or sore throat, or other symptoms of a cold or flu. Do not treat yourself. This drug decreases your body's ability to fight infections. Try to avoid being around people who are sick. This medicine may increase your risk to bruise or bleed. Call your doctor or health care professional if you notice any unusual bleeding. Be careful brushing and flossing your teeth or using a toothpick because you may get an infection or bleed more easily. If you have any dental work done, tell your dentist you are receiving this medicine. Avoid taking products that contain aspirin, acetaminophen, ibuprofen, naproxen, or ketoprofen unless instructed by your doctor. These medicines may hide a fever. Do not become pregnant while taking this medicine. Women should inform their doctor if they wish to become pregnant or think  they might be pregnant. There is a potential for serious side effects to an unborn child. Talk to your health care professional or pharmacist for more information. Do not breast-feed an infant while taking this medicine. What side effects may I notice from receiving this medicine? Side effects that you should report to your doctor or health care professional as soon as possible: -allergic reactions like skin rash, itching or hives, swelling of the face, lips, or tongue -signs of infection - fever or chills, cough, sore throat, pain or difficulty passing urine -signs of decreased platelets or bleeding - bruising, pinpoint red spots on the skin, black, tarry stools, nosebleeds -signs of decreased red blood cells - unusually weak or tired, fainting spells, lightheadedness -breathing problems -changes in hearing -changes in vision -chest pain -high blood pressure -low blood counts - This drug may decrease the number of white blood cells, red blood cells and platelets. You may be at increased risk for infections and bleeding. -nausea and vomiting -pain, swelling, redness or irritation at the injection site -pain, tingling, numbness in the hands or feet -problems with balance, talking, walking -trouble passing urine or change in the amount of urine Side effects that usually do not require medical attention (report to your doctor or health care professional if they continue or are bothersome): -hair loss -loss of appetite -metallic taste in the mouth or changes in taste This list may not describe all possible side effects. Call your doctor for medical  advice about side effects. You may report side effects to FDA at 1-800-FDA-1088. Where should I keep my medicine? This drug is given in a hospital or clinic and will not be stored at home. NOTE: This sheet is a summary. It may not cover all possible information. If you have questions about this medicine, talk to your doctor, pharmacist, or health care  provider.  2012, [employer]. (12/04/2007 2:38:05 PM)Carboplatin injection What is this medicine? CARBOPLATIN (KAR boe pla tin) is a chemotherapy drug. It targets fast dividing cells, like cancer cells, and causes these cells to die. This medicine is used to treat ovarian cancer and many other cancers. This medicine may be used for other purposes; ask your health care provider or pharmacist if you have questions. What should I tell my health care provider before I take this medicine? They need to know if you have any of these conditions: -blood disorders -hearing problems -kidney disease -recent or ongoing radiation therapy -an unusual or allergic reaction to carboplatin, cisplatin, other chemotherapy, other medicines, foods, dyes, or preservatives -pregnant or trying to get pregnant -breast-feeding How should I use this medicine? This drug is usually given as an infusion into a vein. It is administered in a hospital or clinic by a specially trained health care professional. Talk to your pediatrician regarding the use of this medicine in children. Special care may be needed. Overdosage: If you think you have taken too much of this medicine contact a poison control center or emergency room at once. NOTE: This medicine is only for you. Do not share this medicine with others. What if I miss a dose? It is important not to miss a dose. Call your doctor or health care professional if you are unable to keep an appointment. What may interact with this medicine? -medicines for seizures -medicines to increase blood counts like filgrastim, pegfilgrastim, sargramostim -some antibiotics like amikacin, gentamicin, neomycin, streptomycin, tobramycin -vaccines Talk to your doctor or health care professional before taking any of these medicines: -acetaminophen -aspirin -ibuprofen -ketoprofen -naproxen This list may not describe all possible interactions. Give your health care provider a  list of all the medicines, herbs, non-prescription drugs, or dietary supplements you use. Also tell them if you smoke, drink alcohol, or use illegal drugs. Some items may interact with your medicine. What should I watch for while using this medicine? Your condition will be monitored carefully while you are receiving this medicine. You will need important blood work done while you are taking this medicine. This drug may make you feel generally unwell. This is not uncommon, as chemotherapy can affect healthy cells as well as cancer cells. Report any side effects. Continue your course of treatment even though you feel ill unless your doctor tells you to stop. In some cases, you may be given additional medicines to help with side effects. Follow all directions for their use. Call your doctor or health care professional for advice if you get a fever, chills or sore throat, or other symptoms of a cold or flu. Do not treat yourself. This drug decreases your body's ability to fight infections. Try to avoid being around people who are sick. This medicine may increase your risk to bruise or bleed. Call your doctor or health care professional if you notice any unusual bleeding. Be careful brushing and flossing your teeth or using a toothpick because you may get an infection or bleed more easily. If you have any dental work done, tell your dentist you are receiving this  medicine. Avoid taking products that contain aspirin, acetaminophen, ibuprofen, naproxen, or ketoprofen unless instructed by your doctor. These medicines may hide a fever. Do not become pregnant while taking this medicine. Women should inform their doctor if they wish to become pregnant or think they might be pregnant. There is a potential for serious side effects to an unborn child. Talk to your health care professional or pharmacist for more information. Do not breast-feed an infant while taking this medicine. What side effects may I notice from  receiving this medicine? Side effects that you should report to your doctor or health care professional as soon as possible: -allergic reactions like skin rash, itching or hives, swelling of the face, lips, or tongue -signs of infection - fever or chills, cough, sore throat, pain or difficulty passing urine -signs of decreased platelets or bleeding - bruising, pinpoint red spots on the skin, black, tarry stools, nosebleeds -signs of decreased red blood cells - unusually weak or tired, fainting spells, lightheadedness -breathing problems -changes in hearing -changes in vision -chest pain -high blood pressure -low blood counts - This drug may decrease the number of white blood cells, red blood cells and platelets. You may be at increased risk for infections and bleeding. -nausea and vomiting -pain, swelling, redness or irritation at the injection site -pain, tingling, numbness in the hands or feet -problems with balance, talking, walking -trouble passing urine or change in the amount of urine Side effects that usually do not require medical attention (report to your doctor or health care professional if they continue or are bothersome): -hair loss -loss of appetite -metallic taste in the mouth or changes in taste This list may not describe all possible side effects. Call your doctor for medical advice about side effects. You may report side effects to FDA at 1-800-FDA-1088. Where should I keep my medicine? This drug is given in a hospital or clinic and will not be stored at home. NOTE: This sheet is a summary. It may not cover all possible information. If you have questions about this medicine, talk to your doctor, pharmacist, or health care provider.  2012, [employer]. (12/04/2007 2:38:05 PM)Cetuximab injection What is this medicine? CETUXIMAB (se TUX i mab) is a chemotherapy drug. It targets a specific protein within cancer cells and stops the cells from growing. It is used to  treat colorectal cancer and head and neck cancer. This medicine may be used for other purposes; ask your health care provider or pharmacist if you have questions. What should I tell my health care provider before I take this medicine? They need to know if you have any of these conditions: -heart disease -history of irregular heartbeat -history of low levels of calcium, magnesium, or potassium in the blood -lung or breathing disease, like asthma -an unusual or allergic reaction to cetuximab, other medicines, foods, dyes, or preservatives -pregnant or trying to get pregnant -breast-feeding How should I use this medicine? This drug is given as an infusion into a vein. It is administered in a hospital or clinic by a specially trained health care professional. Talk to your pediatrician regarding the use of this medicine in children. Special care may be needed. Overdosage: If you think you have taken too much of this medicine contact a poison control center or emergency room at once. NOTE: This medicine is only for you. Do not share this medicine with others. What if I miss a dose? It is important not to miss your dose. Call your doctor or health  care professional if you are unable to keep an appointment. What may interact with this medicine? Interactions are not expected. This list may not describe all possible interactions. Give your health care provider a list of all the medicines, herbs, non-prescription drugs, or dietary supplements you use. Also tell them if you smoke, drink alcohol, or use illegal drugs. Some items may interact with your medicine. What should I watch for while using this medicine? Visit your doctor or health care professional for regular checks on your progress. This drug may make you feel generally unwell. This is not uncommon, as chemotherapy can affect healthy cells as well as cancer cells. Report any side effects. Continue your course of treatment even though you feel ill  unless your doctor tells you to stop. This medicine can make you more sensitive to the sun. Keep out of the sun while taking this medicine and for 2 months after the last dose. If you cannot avoid being in the sun, wear protective clothing and use sunscreen. Do not use sun lamps or tanning beds/booths. You may need blood work done while you are taking this medicine. In some cases, you may be given additional medicines to help with side effects. Follow all directions for their use. Call your doctor or health care professional for advice if you get a fever, chills or sore throat, or other symptoms of a cold or flu. Do not treat yourself. This drug decreases your body's ability to fight infections. Try to avoid being around people who are sick. Avoid taking products that contain aspirin, acetaminophen, ibuprofen, naproxen, or ketoprofen unless instructed by your doctor. These medicines may hide a fever. Do not become pregnant while taking this medicine. Women should inform their doctor if they wish to become pregnant or think they might be pregnant. There is a potential for serious side effects to an unborn child. Use adequate birth control methods. Avoid pregnancy for at least 6 months after your last dose. Talk to your health care professional or pharmacist for more information. Do not breast-feed an infant while taking this medicine or during the 2 months after your last dose. What side effects may I notice from receiving this medicine? Side effects that you should report to your doctor or health care professional as soon as possible: -allergic reactions like skin rash, itching or hives, swelling of the face, lips, or tongue -breathing problems -changes in vision -fast, irregular heartbeat -feeling faint or lightheaded, falls -fever, chills -mouth sores -trouble passing urine or change in the amount of urine -unusually weak or tired Side effects that usually do not require medical attention (report  to your doctor or health care professional if they continue or are bothersome): -changes in skin like acne, cracks, skin dryness -constipation -diarrhea -headache -nail changes -nausea, vomiting -stomach upset -weight loss This list may not describe all possible side effects. Call your doctor for medical advice about side effects. You may report side effects to FDA at 1-800-FDA-1088. Where should I keep my medicine? This drug is given in a hospital or clinic and will not be stored at home. NOTE: This sheet is a summary. It may not cover all possible information. If you have questions about this medicine, talk to your doctor, pharmacist, or health care provider.  2013, [employer]. (07/20/2010 2:01:41 PM)

## 2012-12-17 NOTE — Progress Notes (Signed)
No indication of reaction to test dose of [place], will proceed with tx..   dmr

## 2012-12-17 NOTE — Telephone Encounter (Signed)
Gave pt appt fpr April  and [facility] 2014 lab, chemo ,Ml and MD

## 2012-12-17 NOTE — Progress Notes (Signed)
Carbo test dose given at 1050.  dmr

## 2012-12-18 ENCOUNTER — Encounter: Payer: Self-pay | Admitting: Oncology

## 2012-12-18 NOTE — Progress Notes (Signed)
Received call from [place], [place] @ [facility] ([phone]), informing us that patient is now scheduled to see Dr. Madilyn Fireman on Monday, December 31, 2012 @ 10:30am.

## 2012-12-18 NOTE — Progress Notes (Signed)
Will advised Thomas Bean, found some bills to fax to [facility] and I called Thomas Bean to advise I was faxing to her.

## 2012-12-21 ENCOUNTER — Encounter: Payer: Self-pay | Admitting: Oncology

## 2012-12-21 NOTE — Progress Notes (Signed)
Called and verified Ms. Uvaldo Rising got my fax for patient. She did but needed 90.00 more. I sent another to her 11/29/12 dos 195.00.

## 2012-12-24 ENCOUNTER — Encounter: Payer: Self-pay | Admitting: Oncology

## 2012-12-24 ENCOUNTER — Ambulatory Visit (HOSPITAL_BASED_OUTPATIENT_CLINIC_OR_DEPARTMENT_OTHER): Payer: Medicaid Other

## 2012-12-24 ENCOUNTER — Other Ambulatory Visit (HOSPITAL_BASED_OUTPATIENT_CLINIC_OR_DEPARTMENT_OTHER): Payer: Medicaid Other | Admitting: Lab

## 2012-12-24 VITALS — BP 118/63 | HR 72 | Temp 98.7°F | Resp 19

## 2012-12-24 DIAGNOSIS — Z5112 Encounter for antineoplastic immunotherapy: Secondary | ICD-10-CM

## 2012-12-24 DIAGNOSIS — C7951 Secondary malignant neoplasm of bone: Secondary | ICD-10-CM

## 2012-12-24 DIAGNOSIS — C7952 Secondary malignant neoplasm of bone marrow: Secondary | ICD-10-CM

## 2012-12-24 DIAGNOSIS — Z5111 Encounter for antineoplastic chemotherapy: Secondary | ICD-10-CM

## 2012-12-24 DIAGNOSIS — C029 Malignant neoplasm of tongue, unspecified: Secondary | ICD-10-CM

## 2012-12-24 LAB — CBC WITH DIFFERENTIAL/PLATELET
EOS%: 1.9 % (ref 0.0–7.0)
Eosinophils Absolute: 0.1 10*3/uL (ref 0.0–0.5)
LYMPH%: 19.7 % (ref 14.0–49.0)
MCH: 33.1 pg (ref 27.2–33.4)
MCV: 102.4 fL — ABNORMAL HIGH (ref 79.3–98.0)
MONO%: 6.3 % (ref 0.0–14.0)
NEUT#: 2.6 10*3/uL (ref 1.5–6.5)
Platelets: 209 10*3/uL (ref 140–400)
RBC: 3.32 10*6/uL — ABNORMAL LOW (ref 4.20–5.82)
RDW: 13.4 % (ref 11.0–14.6)
nRBC: 0 % (ref 0–0)

## 2012-12-24 LAB — BASIC METABOLIC PANEL (CC13)
CO2: 28 mEq/L (ref 22–29)
Calcium: 9.5 mg/dL (ref 8.4–10.4)
Creatinine: 0.6 mg/dL — ABNORMAL LOW (ref 0.7–1.3)
Glucose: 83 mg/dl (ref 70–99)

## 2012-12-24 MED ORDER — ONDANSETRON 16 MG/50ML IVPB (CHCC)
16.0000 mg | Freq: Once | INTRAVENOUS | Status: AC
  Administered 2012-12-24: 16 mg via INTRAVENOUS

## 2012-12-24 MED ORDER — FAMOTIDINE IN NACL 20-0.9 MG/50ML-% IV SOLN
20.0000 mg | Freq: Once | INTRAVENOUS | Status: AC
  Administered 2012-12-24: 20 mg via INTRAVENOUS

## 2012-12-24 MED ORDER — SODIUM CHLORIDE 0.9 % IV SOLN
Freq: Once | INTRAVENOUS | Status: AC
  Administered 2012-12-24: 11:00:00 via INTRAVENOUS

## 2012-12-24 MED ORDER — CETUXIMAB CHEMO IV INJECTION 200 MG/100ML
166.6667 mg/m2 | Freq: Once | INTRAVENOUS | Status: AC
  Administered 2012-12-24: 300 mg via INTRAVENOUS
  Filled 2012-12-24: qty 150

## 2012-12-24 MED ORDER — DIPHENHYDRAMINE HCL 50 MG/ML IJ SOLN
50.0000 mg | Freq: Once | INTRAMUSCULAR | Status: AC
  Administered 2012-12-24: 50 mg via INTRAVENOUS

## 2012-12-24 MED ORDER — SODIUM CHLORIDE 0.9 % IJ SOLN
10.0000 mL | INTRAMUSCULAR | Status: DC | PRN
  Administered 2012-12-24: 10 mL
  Filled 2012-12-24: qty 10

## 2012-12-24 MED ORDER — PACLITAXEL CHEMO INJECTION 300 MG/50ML
48.0000 mg/m2 | Freq: Once | INTRAVENOUS | Status: AC
  Administered 2012-12-24: 90 mg via INTRAVENOUS
  Filled 2012-12-24: qty 15

## 2012-12-24 MED ORDER — CARBOPLATIN CHEMO INTRADERMAL TEST DOSE 100MCG/0.02ML
100.0000 ug | Freq: Once | INTRADERMAL | Status: AC
  Administered 2012-12-24: 100 ug via INTRADERMAL
  Filled 2012-12-24: qty 0.01

## 2012-12-24 MED ORDER — SODIUM CHLORIDE 0.9 % IV SOLN
180.0000 mg | Freq: Once | INTRAVENOUS | Status: AC
  Administered 2012-12-24: 180 mg via INTRAVENOUS
  Filled 2012-12-24: qty 18

## 2012-12-24 MED ORDER — HEPARIN SOD (PORK) LOCK FLUSH 100 UNIT/ML IV SOLN
500.0000 [IU] | Freq: Once | INTRAVENOUS | Status: AC | PRN
  Administered 2012-12-24: 500 [IU]
  Filled 2012-12-24: qty 5

## 2012-12-24 MED ORDER — DEXAMETHASONE SODIUM PHOSPHATE 4 MG/ML IJ SOLN
20.0000 mg | Freq: Once | INTRAMUSCULAR | Status: AC
  Administered 2012-12-24: 20 mg via INTRAVENOUS

## 2012-12-24 NOTE — Patient Instructions (Addendum)
[facility] Discharge Instructions for Patients Receiving Chemotherapy  Today you received the following chemotherapy agents Erbitux, Taxol and Carboplatin.  To help prevent nausea and vomiting after your treatment, we encourage you to take your nausea medication. Begin taking your nausea medication as as often as prescribed for by Dr Gaylyn Rong.    If you develop nausea and vomiting that is not controlled by your nausea medication, call the clinic. If it is after clinic hours your family physician or the after hours number for the clinic or go to the Emergency Department.   BELOW ARE SYMPTOMS THAT SHOULD BE REPORTED IMMEDIATELY:  *FEVER GREATER THAN 100.5 F  *CHILLS WITH OR WITHOUT FEVER  NAUSEA AND VOMITING THAT IS NOT CONTROLLED WITH YOUR NAUSEA MEDICATION  *UNUSUAL SHORTNESS OF BREATH  *UNUSUAL BRUISING OR BLEEDING  TENDERNESS IN MOUTH AND THROAT WITH OR WITHOUT PRESENCE OF ULCERS  *URINARY PROBLEMS  *BOWEL PROBLEMS  UNUSUAL RASH Items with * indicate a potential emergency and should be followed up as soon as possible.  One of the nurses will contact you 24 hours after your treatment. Please let the nurse know about any problems that you may have experienced. Feel free to call the clinic you have any questions or concerns. The clinic phone number is [phone].   I have been informed and understand all the instructions given to me. I know to contact the clinic, my physician, or go to the Emergency Department if any problems should occur. I do not have any questions at this time, but understand that I may call the clinic during office hours   should I have any questions or need assistance in obtaining follow up care.    __________________________________________  _____________  __________ Signature of Patient or Authorized Representative            Date                   Time    __________________________________________ Nurse's Signature

## 2012-12-24 NOTE — Progress Notes (Signed)
Received email from Ms. Uvaldo Rising, his medicaid has bee reinstated 12/03/12-04/11/13. I will advise the patient. He has met his deductible.

## 2012-12-24 NOTE — Progress Notes (Signed)
1123 Carbo intradermal test negative for reaction at 5, 15 and 30 minutes.

## 2012-12-25 ENCOUNTER — Other Ambulatory Visit: Payer: Self-pay | Admitting: Certified Registered Nurse Anesthetist

## 2012-12-31 ENCOUNTER — Ambulatory Visit: Payer: Self-pay

## 2012-12-31 ENCOUNTER — Other Ambulatory Visit: Payer: Self-pay | Admitting: Lab

## 2013-01-01 ENCOUNTER — Telehealth: Payer: Self-pay | Admitting: Oncology

## 2013-01-02 ENCOUNTER — Ambulatory Visit (INDEPENDENT_AMBULATORY_CARE_PROVIDER_SITE_OTHER): Payer: Medicaid Other | Admitting: Internal Medicine

## 2013-01-02 ENCOUNTER — Encounter: Payer: Self-pay | Admitting: Internal Medicine

## 2013-01-02 VITALS — BP 92/64 | HR 88 | Temp 98.5°F | Wt 141.0 lb

## 2013-01-02 DIAGNOSIS — C770 Secondary and unspecified malignant neoplasm of lymph nodes of head, face and neck: Secondary | ICD-10-CM

## 2013-01-02 DIAGNOSIS — C029 Malignant neoplasm of tongue, unspecified: Secondary | ICD-10-CM

## 2013-01-02 DIAGNOSIS — F411 Generalized anxiety disorder: Secondary | ICD-10-CM

## 2013-01-02 DIAGNOSIS — F419 Anxiety disorder, unspecified: Secondary | ICD-10-CM

## 2013-01-02 DIAGNOSIS — C801 Malignant (primary) neoplasm, unspecified: Secondary | ICD-10-CM

## 2013-01-02 MED ORDER — LORAZEPAM 0.5 MG PO TABS: 0.5000 mg | ORAL_TABLET | Freq: Four times a day (QID) | ORAL | Status: DC | PRN

## 2013-01-02 NOTE — Progress Notes (Signed)
Patient ID: Thomas Bean, male   DOB: 1961-07-31, 52 y.o.   MRN: [id number] Anxiety/depression- Much better with sertraline and lorazepam.  He is actively being treated for head and neck ca  Reviewed meds, pmh, psh  sochx- reviewed   well-developed well-nourished male in no acute distress. HEENT - skin is red and irritated, normocephalic, neck supple without jugular venous distention. Chest clear to auscultation cardiac exam S1-S2 are regular. Abdominal exam overweight with bowel sounds, soft and nontender. Extremities no edema. Neurologic exam is alert with a normal gait.  A/p-- anxiety/depression much better.  Continue sertraline

## 2013-01-04 ENCOUNTER — Ambulatory Visit (HOSPITAL_BASED_OUTPATIENT_CLINIC_OR_DEPARTMENT_OTHER): Payer: Medicaid Other | Admitting: Oncology

## 2013-01-04 ENCOUNTER — Encounter: Payer: Self-pay | Admitting: Oncology

## 2013-01-04 ENCOUNTER — Other Ambulatory Visit (HOSPITAL_BASED_OUTPATIENT_CLINIC_OR_DEPARTMENT_OTHER): Payer: Medicaid Other | Admitting: Lab

## 2013-01-04 ENCOUNTER — Ambulatory Visit: Payer: Self-pay | Admitting: Internal Medicine

## 2013-01-04 VITALS — BP 113/72 | HR 77 | Temp 97.5°F | Resp 18 | Ht 72.0 in | Wt 140.1 lb

## 2013-01-04 DIAGNOSIS — C7951 Secondary malignant neoplasm of bone: Secondary | ICD-10-CM

## 2013-01-04 DIAGNOSIS — D6481 Anemia due to antineoplastic chemotherapy: Secondary | ICD-10-CM

## 2013-01-04 DIAGNOSIS — C7800 Secondary malignant neoplasm of unspecified lung: Secondary | ICD-10-CM

## 2013-01-04 DIAGNOSIS — C029 Malignant neoplasm of tongue, unspecified: Secondary | ICD-10-CM

## 2013-01-04 DIAGNOSIS — T451X5A Adverse effect of antineoplastic and immunosuppressive drugs, initial encounter: Secondary | ICD-10-CM

## 2013-01-04 LAB — CBC WITH DIFFERENTIAL/PLATELET
BASO%: 1 % (ref 0.0–2.0)
Basophils Absolute: 0 10*3/uL (ref 0.0–0.1)
EOS%: 2.3 % (ref 0.0–7.0)
HGB: 10.3 g/dL — ABNORMAL LOW (ref 13.0–17.1)
MCH: 34.7 pg — ABNORMAL HIGH (ref 27.2–33.4)
MCHC: 34.4 g/dL (ref 32.0–36.0)
RBC: 2.98 10*6/uL — ABNORMAL LOW (ref 4.20–5.82)
RDW: 14 % (ref 11.0–14.6)
lymph#: 0.6 10*3/uL — ABNORMAL LOW (ref 0.9–3.3)

## 2013-01-04 LAB — BASIC METABOLIC PANEL (CC13)
Chloride: 102 mEq/L (ref 98–107)
Potassium: 3.5 mEq/L (ref 3.5–5.1)
Sodium: 141 mEq/L (ref 136–145)

## 2013-01-04 NOTE — Progress Notes (Signed)
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 He now developed biopsy proven metastatic disease in 03/2012 with met in right neck, left lung mass and left illac.   PAST THERAPY:  1. 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 He developed recurrence in the left submandibular gland in August 2012. He underwent on 07/27/2011 radical left neck dissection by Dr. Ezzard Standing and Dr. Geryl Rankins. Pathology case number [id number] showed total 15 lymph nodes from left radical neck dissection that was all negative. However the left submandibular gland mass showed a 3.5 cm invasive SCC, moderately differentiates involving the adjacent skeletal muscle tissue and bone a tissue and extending into the inked margin. There was no evidence of any lymphatic invasion or perineural invasion. He underwent further resection by Dr. Mary Sella at [facility] on 09/29/2011.  2. He received adjuvant chemoradiation therapy with q 3 week Cisplatin given 11/15/11-12/05/11 (received 2 of 3 doses; 3rd dose held due to febrile neutropenia and development of flap abscess). He received concurrent XRT 11/15/11 through 01/05/12.  3. For right neck recurrence in 03/2012; he underwent palliative right neck dissection by Dr. Manson Passey at [facility].   CURRENT THERAPY: started on 05/15/2012 palliative chemo weekly Carboplatin/Taxol/Erbitux; 3 weeks on; 1 week off.  Due to prolonged cytopenia, his chemo has been changed to weekly; 2 weeks on, 1 week off.   INTERVAL HISTORY: Thomas Bean 52 y.o. male returns for regular follow up by himself.  His rash is improving.  He has been taking Cleocin and hydrocortisone cream.  The skin rash is not pruritic and no skin breakdown.  He has mild to  moderate fatigue.  He is still independent of activities of daily living.  However, he does not do much other chores around the house.  He is depressed and anxious; better than before starting Zoloft.  However, there are things going on between him and his son which stresses him out. 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  He still has moderate to severe jaw pain from past head/neck surgery, and he needs Lortab elixir about 3-4x/day.   He denied fever, mucositis, cough, SOB, chest pain, abd pain, bleeding.  The rest of the 14 point review of system was negative.    Past Medical History  Diagnosis Date  . Hemorrhoids   . PONV (postoperative nausea and vomiting)   . Hypertension     no medications at present  . GERD (gastroesophageal reflux disease)     no medication  . Depression     treated approx 12 years ago, no treatment at this time  . Hiatal hernia   . Neutropenia 12/23/2011  . Abscess 12/27/2011  . Hx of radiation therapy 11/15/11 -01/05/12    left lateral tongue  . Lesion of left lung 03/14/12    CT chest, [facility], squamous cell carcinoma  . Facial rash 05/24/2012  . History of radiation therapy 07/24/12-07/26/12    left iliac bone, 18Gy/3 sessions  . Cancer 12/2010    left side of tongue T1 lesion excised 4/12  . Lung metastasis dx'd 03/2012  . Bone metastasis 04/25/12    PET scan-left iliac bone    Past Surgical History  Procedure Laterality Date  . Inguinal herniorrhapy    .  Tonsillectomy and adenoidectomy    . Excision of tongue mass      April 2012  . Hernia repair    . Neck mass excision  07/27/2011  . Radical neck dissection  07/27/2011    Procedure: RADICAL NECK DISSECTION;  Surgeon: Drema Halon, MD;  Location: [facility] OR;  Service: ENT;  Laterality: Left;  Marland Kitchen Mandibulectomy  09/2011    and scapular free flap  . Modified radical neck dissection  03/27/12    excision of lip lesion, lip benign, right neck squamous cell ca    Current  Outpatient Prescriptions  Medication Sig Dispense Refill  . clindamycin (CLINDAGEL) 1 % gel Apply topically as needed. Apply to facial and chest skin rash.      . clobetasol cream (TEMOVATE) 0.05 % Apply topically as needed. Apply to rash BID      . gabapentin (NEURONTIN) 300 MG capsule Take 1 capsule (300 mg total) by mouth 3 (three) times daily.  90 capsule  3  . HYDROcodone-acetaminophen (NORCO/VICODIN) 5-325 MG per tablet Take 1 tablet by mouth every 6 (six) hours as needed for pain.  120 tablet  0  . hydrocortisone cream 0.5 % Apply topically 2 (two) times daily as needed. Apply to axilla rash twice daily as needed.      . lidocaine-prilocaine (EMLA) cream Apply topically as needed. Apply to [facility] site one hour prior to needle stick to numb skin.  30 g  3  . LORazepam (ATIVAN) 0.5 MG tablet Place 1 tablet (0.5 mg total) under the tongue every 6 (six) hours as needed for anxiety (nausea/vomiting).  60 tablet  5  . Nutritional Supplements (FEEDING SUPPLEMENT, OSMOLITE 1.5 CAL,) LIQD Begin Osmolite 1.5 at 60 ml/hr for 12 hours nocturnal continuous feeding daily. Flush with 60 ml free water before and after continuous feedings. Continue 1 can of Osmolite 1.5 via gravity feeding TID with 60 ml free water before and after each bolus feeding. Please send pump to patient.  1422 mL  0  . omeprazole (PRILOSEC) 10 MG capsule Take 10 mg by mouth daily.      . ondansetron (ZOFRAN) 8 MG tablet Take 8 mg by mouth every 12 (twelve) hours as needed.      . prochlorperazine (COMPAZINE) 10 MG tablet Take 1 tablet (10 mg total) by mouth every 6 (six) hours as needed.  60 tablet  1  . sertraline (ZOLOFT) 50 MG tablet Take 50 mg by mouth daily.      . sodium fluoride (PREVIDENT 5000 PLUS) 1.1 % CREA dental cream Apply thin ribbon of cream to tooth brush. Brush teeth for 2 minutes. Spit out excess-DO NOT swallow. DO NOT rinse afterwards. Repeat nightly.  1 Tube  prn   No current facility-administered medications  for this visit.    ALLERGIES:  is allergic to codeine phosphate; morphine and related; and tape.  Filed Vitals:   01/04/13 1332  BP: 113/72  Pulse: 77  Temp: 97.5 F (36.4 C)  Resp: 18   Wt Readings from Last 3 Encounters:  01/04/13 140 lb 1.6 oz (63.549 kg)  01/02/13 141 lb (63.957 kg)  12/17/12 136 lb 12.8 oz (62.052 kg)   ECOG Performance status: 1-2  PHYSICAL EXAMINATION:   General: thin-appearing man, in no acute distress. Eyes: no scleral icterus. ENT: There were no oropharyngeal lesions on my unaided exam. Neck was without thyromegaly. There was a flap in the left jaw which has healed. Right neck was fibrotic from resection.  There was no erythema, purulent discharge, pain on palpation. Lymphatics: no clear palpable adenopathy. Respiratory: lungs were clear bilaterally without wheezing or crackles. Cardiovascular: Regular rate and rhythm, S1/S2, without murmur, rub or gallop. There was no pedal edema. GI: abdomen was soft, flat, nontender, nondistended, without organomegaly. PEG tube was dry, clean, intact. Muscoloskeletal: no spinal tenderness of palpation of vertebral spine. Skin exam was without echymosis, petichae. There was mild aciniform skin rash on the face, scalp, and chest.   Neuro exam was nonfocal. Patient was able to get on and off exam table without assistance. Gait was normal. Patient was alerted and oriented. Attention was good. Language was appropriate. Mood was normal without depression. Speech was not pressured. Thought content was not tangential.    LABORATORY/RADIOLOGY DATA:  Lab Results  Component Value Date   WBC 3.0* 01/04/2013   HGB 10.3* 01/04/2013   HCT 30.1* 01/04/2013   PLT 190 01/04/2013   GLUCOSE 83 12/24/2012   CHOL 160 02/14/2008   TRIG 442* 02/14/2008   HDL 26.5* 02/14/2008   LDLDIRECT 64.8 02/14/2008   ALKPHOS 44 10/08/2012   ALT 12 10/08/2012   AST 16 10/08/2012   NA 139 12/24/2012   K 3.9 12/24/2012   CL 103 12/24/2012   CREATININE 0.6* 12/24/2012    BUN 7.4 12/24/2012   CO2 28 12/24/2012   INR 0.99 08/20/2012    ASSESSMENT AND PLAN:   1. Smoking: none at this time.   2. Recurrent left lateral tongue squamous cell carcinoma: He now has metastatic disease to the lung and bone.  TCt scan from April 2014 showed partial disease response in the left lung mass and iliac bone.  - He has grade 2-3 skin rash.  I recommended continuing with chemo with Carboplatin/Taxol/Erbitux but further dose reduction of Erbitux from 75% to 66% of baseline.   - He has been referred to [facility] with Med Onc Dr. Elaina Pattee to see if patient has any targetable mutation if and when he progresses through the current therapy. He has a visit on 01/21/13.  3.  Bone met: limited.  S/p palliative XRT.   He will continue with Zometa every 6 weeks to decrease risk of skeletal-related event.    4. Anemia: This is from chemo.  There is no active bleeding. No transfusion indicated.   5. Calorie/Protein malnutrition: weight is stable.  6. Mouth pain from treatment: He is on Vicodin prn.  I again refilled this today.   7. Skin rash: Due to Erbitux.  I advised him to continue Cleocin cream and Hydrocortisone cream prn. Hopefully with the dose reduction in Erbitux, his rash will improved.  If not worsens in the future, we may consider holding off on Erbitux and only continue on Carboplatin/Taxol.  He stopped Doxycycline due to potential role in severe axillary rash in the past.    8.   Follow up: in about 3 weeks before the next cycle of chemo.       The length of time of the face-to-face encounter was 15 minutes. More than 50% of time was spent counseling and coordination of care.

## 2013-01-07 ENCOUNTER — Other Ambulatory Visit: Payer: Medicaid Other | Admitting: Lab

## 2013-01-07 ENCOUNTER — Other Ambulatory Visit: Payer: Self-pay | Admitting: Oncology

## 2013-01-07 ENCOUNTER — Ambulatory Visit (HOSPITAL_BASED_OUTPATIENT_CLINIC_OR_DEPARTMENT_OTHER): Payer: Medicaid Other

## 2013-01-07 DIAGNOSIS — C029 Malignant neoplasm of tongue, unspecified: Secondary | ICD-10-CM

## 2013-01-07 DIAGNOSIS — Z5112 Encounter for antineoplastic immunotherapy: Secondary | ICD-10-CM

## 2013-01-07 DIAGNOSIS — C7951 Secondary malignant neoplasm of bone: Secondary | ICD-10-CM

## 2013-01-07 DIAGNOSIS — C770 Secondary and unspecified malignant neoplasm of lymph nodes of head, face and neck: Secondary | ICD-10-CM

## 2013-01-07 DIAGNOSIS — Z5111 Encounter for antineoplastic chemotherapy: Secondary | ICD-10-CM

## 2013-01-07 MED ORDER — SODIUM CHLORIDE 0.9 % IV SOLN
Freq: Once | INTRAVENOUS | Status: AC
  Administered 2013-01-07: 10:00:00 via INTRAVENOUS

## 2013-01-07 MED ORDER — CETUXIMAB CHEMO IV INJECTION 200 MG/100ML
166.6667 mg/m2 | Freq: Once | INTRAVENOUS | Status: AC
  Administered 2013-01-07: 300 mg via INTRAVENOUS
  Filled 2013-01-07: qty 150

## 2013-01-07 MED ORDER — DIPHENHYDRAMINE HCL 50 MG/ML IJ SOLN
50.0000 mg | Freq: Once | INTRAMUSCULAR | Status: AC
  Administered 2013-01-07: 50 mg via INTRAVENOUS

## 2013-01-07 MED ORDER — ZOLEDRONIC ACID 4 MG/100ML IV SOLN
4.0000 mg | Freq: Once | INTRAVENOUS | Status: AC
  Administered 2013-01-07: 4 mg via INTRAVENOUS
  Filled 2013-01-07: qty 100

## 2013-01-07 MED ORDER — SODIUM CHLORIDE 0.9 % IJ SOLN
10.0000 mL | INTRAMUSCULAR | Status: DC | PRN
  Administered 2013-01-07: 10 mL
  Filled 2013-01-07: qty 10

## 2013-01-07 MED ORDER — DEXAMETHASONE SODIUM PHOSPHATE 4 MG/ML IJ SOLN
20.0000 mg | Freq: Once | INTRAMUSCULAR | Status: AC
  Administered 2013-01-07: 20 mg via INTRAVENOUS

## 2013-01-07 MED ORDER — FAMOTIDINE IN NACL 20-0.9 MG/50ML-% IV SOLN
20.0000 mg | Freq: Once | INTRAVENOUS | Status: AC
  Administered 2013-01-07: 20 mg via INTRAVENOUS

## 2013-01-07 MED ORDER — ONDANSETRON 16 MG/50ML IVPB (CHCC)
16.0000 mg | Freq: Once | INTRAVENOUS | Status: AC
  Administered 2013-01-07: 16 mg via INTRAVENOUS

## 2013-01-07 MED ORDER — PACLITAXEL CHEMO INJECTION 300 MG/50ML
48.0000 mg/m2 | Freq: Once | INTRAVENOUS | Status: AC
  Administered 2013-01-07: 90 mg via INTRAVENOUS
  Filled 2013-01-07: qty 15

## 2013-01-07 MED ORDER — HEPARIN SOD (PORK) LOCK FLUSH 100 UNIT/ML IV SOLN
500.0000 [IU] | Freq: Once | INTRAVENOUS | Status: AC | PRN
  Administered 2013-01-07: 500 [IU]
  Filled 2013-01-07: qty 5

## 2013-01-07 MED ORDER — CARBOPLATIN CHEMO INTRADERMAL TEST DOSE 100MCG/0.02ML
100.0000 ug | Freq: Once | INTRADERMAL | Status: AC
  Administered 2013-01-07: 100 ug via INTRADERMAL
  Filled 2013-01-07: qty 0.01

## 2013-01-07 MED ORDER — SODIUM CHLORIDE 0.9 % IV SOLN
180.0000 mg | Freq: Once | INTRAVENOUS | Status: AC
  Administered 2013-01-07: 180 mg via INTRAVENOUS
  Filled 2013-01-07: qty 18

## 2013-01-07 NOTE — Patient Instructions (Addendum)
[facility] Discharge Instructions for Patients Receiving Chemotherapy  Today you received the following chemotherapy agents Taxol/Carboplatin/Erbitux/Zometa.   To help prevent nausea and vomiting after your treatment, we encourage you to take your nausea medication as directed.  If you develop nausea and vomiting that is not controlled by your nausea medication, call the clinic. If it is after clinic hours your family physician or the after hours number for the clinic or go to the Emergency Department.   BELOW ARE SYMPTOMS THAT SHOULD BE REPORTED IMMEDIATELY:  *FEVER GREATER THAN 100.5 F  *CHILLS WITH OR WITHOUT FEVER  NAUSEA AND VOMITING THAT IS NOT CONTROLLED WITH YOUR NAUSEA MEDICATION  *UNUSUAL SHORTNESS OF BREATH  *UNUSUAL BRUISING OR BLEEDING  TENDERNESS IN MOUTH AND THROAT WITH OR WITHOUT PRESENCE OF ULCERS  *URINARY PROBLEMS  *BOWEL PROBLEMS  UNUSUAL RASH Items with * indicate a potential emergency and should be followed up as soon as possible.  Feel free to call the clinic you have any questions or concerns. The clinic phone number is [phone].

## 2013-01-08 ENCOUNTER — Other Ambulatory Visit: Payer: Self-pay | Admitting: Certified Registered Nurse Anesthetist

## 2013-01-14 ENCOUNTER — Ambulatory Visit (HOSPITAL_BASED_OUTPATIENT_CLINIC_OR_DEPARTMENT_OTHER): Payer: Medicaid Other

## 2013-01-14 ENCOUNTER — Other Ambulatory Visit (HOSPITAL_BASED_OUTPATIENT_CLINIC_OR_DEPARTMENT_OTHER): Payer: Medicaid Other | Admitting: Lab

## 2013-01-14 VITALS — BP 123/80 | HR 87 | Temp 97.2°F | Resp 20

## 2013-01-14 DIAGNOSIS — C7951 Secondary malignant neoplasm of bone: Secondary | ICD-10-CM

## 2013-01-14 DIAGNOSIS — C029 Malignant neoplasm of tongue, unspecified: Secondary | ICD-10-CM

## 2013-01-14 DIAGNOSIS — Z5112 Encounter for antineoplastic immunotherapy: Secondary | ICD-10-CM

## 2013-01-14 DIAGNOSIS — Z5111 Encounter for antineoplastic chemotherapy: Secondary | ICD-10-CM

## 2013-01-14 LAB — CBC WITH DIFFERENTIAL/PLATELET
Basophils Absolute: 0 10*3/uL (ref 0.0–0.1)
EOS%: 3.7 % (ref 0.0–7.0)
Eosinophils Absolute: 0.2 10*3/uL (ref 0.0–0.5)
HGB: 10.6 g/dL — ABNORMAL LOW (ref 13.0–17.1)
MCV: 102.2 fL — ABNORMAL HIGH (ref 79.3–98.0)
MONO%: 7.6 % (ref 0.0–14.0)
NEUT#: 3.5 10*3/uL (ref 1.5–6.5)
RBC: 3.2 10*6/uL — ABNORMAL LOW (ref 4.20–5.82)
RDW: 13.7 % (ref 11.0–14.6)
WBC: 4.9 10*3/uL (ref 4.0–10.3)
lymph#: 0.8 10*3/uL — ABNORMAL LOW (ref 0.9–3.3)
nRBC: 0 % (ref 0–0)

## 2013-01-14 LAB — BASIC METABOLIC PANEL (CC13)
CO2: 28 mEq/L (ref 22–29)
Calcium: 9.2 mg/dL (ref 8.4–10.4)
Potassium: 3.7 mEq/L (ref 3.5–5.1)
Sodium: 140 mEq/L (ref 136–145)

## 2013-01-14 MED ORDER — SODIUM CHLORIDE 0.9 % IV SOLN
180.0000 mg | Freq: Once | INTRAVENOUS | Status: AC
  Administered 2013-01-14: 180 mg via INTRAVENOUS
  Filled 2013-01-14: qty 18

## 2013-01-14 MED ORDER — DIPHENHYDRAMINE HCL 50 MG/ML IJ SOLN: 50.0000 mg | Freq: Once | INTRAMUSCULAR | Status: DC

## 2013-01-14 MED ORDER — CARBOPLATIN CHEMO INTRADERMAL TEST DOSE 100MCG/0.02ML
100.0000 ug | Freq: Once | INTRADERMAL | Status: AC
  Administered 2013-01-14: 100 ug via INTRADERMAL
  Filled 2013-01-14: qty 0.01

## 2013-01-14 MED ORDER — DIPHENHYDRAMINE HCL 50 MG/ML IJ SOLN
50.0000 mg | Freq: Once | INTRAMUSCULAR | Status: AC
  Administered 2013-01-14: 50 mg via INTRAVENOUS

## 2013-01-14 MED ORDER — SODIUM CHLORIDE 0.9 % IV SOLN
Freq: Once | INTRAVENOUS | Status: AC
  Administered 2013-01-14: 10:00:00 via INTRAVENOUS

## 2013-01-14 MED ORDER — HEPARIN SOD (PORK) LOCK FLUSH 100 UNIT/ML IV SOLN
500.0000 [IU] | Freq: Once | INTRAVENOUS | Status: AC | PRN
  Administered 2013-01-14: 500 [IU]
  Filled 2013-01-14: qty 5

## 2013-01-14 MED ORDER — SODIUM CHLORIDE 0.9 % IV SOLN
48.0000 mg/m2 | Freq: Once | INTRAVENOUS | Status: AC
  Administered 2013-01-14: 90 mg via INTRAVENOUS
  Filled 2013-01-14: qty 15

## 2013-01-14 MED ORDER — DEXAMETHASONE SODIUM PHOSPHATE 20 MG/5ML IJ SOLN
20.0000 mg | Freq: Once | INTRAMUSCULAR | Status: AC
  Administered 2013-01-14: 20 mg via INTRAVENOUS

## 2013-01-14 MED ORDER — CETUXIMAB CHEMO IV INJECTION 200 MG/100ML
166.6667 mg/m2 | Freq: Once | INTRAVENOUS | Status: AC
  Administered 2013-01-14: 300 mg via INTRAVENOUS
  Filled 2013-01-14: qty 150

## 2013-01-14 MED ORDER — SODIUM CHLORIDE 0.9 % IJ SOLN
10.0000 mL | INTRAMUSCULAR | Status: DC | PRN
  Administered 2013-01-14: 10 mL
  Filled 2013-01-14: qty 10

## 2013-01-14 MED ORDER — FAMOTIDINE IN NACL 20-0.9 MG/50ML-% IV SOLN
20.0000 mg | Freq: Once | INTRAVENOUS | Status: AC
  Administered 2013-01-14: 20 mg via INTRAVENOUS

## 2013-01-14 MED ORDER — ONDANSETRON 16 MG/50ML IVPB (CHCC)
16.0000 mg | Freq: Once | INTRAVENOUS | Status: AC
  Administered 2013-01-14: 16 mg via INTRAVENOUS

## 2013-01-14 NOTE — Patient Instructions (Addendum)
[facility] Discharge Instructions for Patients Receiving Chemotherapy  Today you received the following chemotherapy agents :  Erbitux,  Taxol,  Carboplatin.  To help prevent nausea and vomiting after your treatment, we encourage you to take your nausea medication as instructed by your physician.    If you develop nausea and vomiting that is not controlled by your nausea medication, call the clinic. If it is after clinic hours your family physician or the after hours number for the clinic or go to the Emergency Department.   BELOW ARE SYMPTOMS THAT SHOULD BE REPORTED IMMEDIATELY:  *FEVER GREATER THAN 100.5 F  *CHILLS WITH OR WITHOUT FEVER  NAUSEA AND VOMITING THAT IS NOT CONTROLLED WITH YOUR NAUSEA MEDICATION  *UNUSUAL SHORTNESS OF BREATH  *UNUSUAL BRUISING OR BLEEDING  TENDERNESS IN MOUTH AND THROAT WITH OR WITHOUT PRESENCE OF ULCERS  *URINARY PROBLEMS  *BOWEL PROBLEMS  UNUSUAL RASH Items with * indicate a potential emergency and should be followed up as soon as possible.  One of the nurses will contact you 24 hours after your treatment. Please let the nurse know about any problems that you may have experienced. Feel free to call the clinic you have any questions or concerns. The clinic phone number is [phone].   I have been informed and understand all the instructions given to me. I know to contact the clinic, my physician, or go to the Emergency Department if any problems should occur. I do not have any questions at this time, but understand that I may call the clinic during office hours   should I have any questions or need assistance in obtaining follow up care.    __________________________________________  _____________  __________ Signature of Patient or Authorized Representative            Date                   Time    __________________________________________ Nurse's Signature

## 2013-01-14 NOTE — Progress Notes (Signed)
Carboplatin skin test given intradermally in right anterior forearm at  1018 am 1025 am  -  Negative. 1035  Am  -   Negative. 1100 am   -   Negative.

## 2013-01-21 ENCOUNTER — Encounter: Payer: Self-pay | Admitting: Oncology

## 2013-01-21 ENCOUNTER — Telehealth: Payer: Self-pay | Admitting: *Deleted

## 2013-01-21 NOTE — Telephone Encounter (Signed)
Pt called back to say he has not heard anything back from Dr. Luretha Murphy office about appt.   Dr. Gaylyn Rong instructs for pt to call Dr. Kirtland Bouchard office again about appt.  States he did s/w Dr. Kirtland Bouchard who agreed to see pt.  Informed pt of this and asked him to call Dr. Kirtland Bouchard again about appt.  He verbalized understanding.

## 2013-01-21 NOTE — Telephone Encounter (Signed)
VM from pt states increased pain and pressure in Jaw at surgical site, has had to increase amount of pain medication to one to two tabs every 4 hrs for pain 8/10.   He sees Dr. Gaylyn Rong this Friday 5/16 but asks if he should see him sooner or see Dr. Kristin Bruins?    Called pt back and instructed to call Dr. Luretha Murphy office for appt w/i the next few days and to call this nurse back if he needs any help in getting appt.  Pt verbalized understanding.

## 2013-01-23 ENCOUNTER — Encounter: Payer: Self-pay | Admitting: Oncology

## 2013-01-24 ENCOUNTER — Ambulatory Visit (HOSPITAL_COMMUNITY): Payer: Self-pay | Admitting: Dentistry

## 2013-01-24 ENCOUNTER — Encounter (HOSPITAL_COMMUNITY): Payer: Self-pay | Admitting: Dentistry

## 2013-01-24 VITALS — BP 99/65 | HR 73 | Temp 98.4°F

## 2013-01-24 DIAGNOSIS — R200 Anesthesia of skin: Secondary | ICD-10-CM

## 2013-01-24 DIAGNOSIS — R6884 Jaw pain: Secondary | ICD-10-CM

## 2013-01-24 DIAGNOSIS — C7952 Secondary malignant neoplasm of bone marrow: Secondary | ICD-10-CM

## 2013-01-24 DIAGNOSIS — Z923 Personal history of irradiation: Secondary | ICD-10-CM

## 2013-01-24 DIAGNOSIS — R209 Unspecified disturbances of skin sensation: Secondary | ICD-10-CM

## 2013-01-24 DIAGNOSIS — C801 Malignant (primary) neoplasm, unspecified: Secondary | ICD-10-CM

## 2013-01-24 DIAGNOSIS — C7800 Secondary malignant neoplasm of unspecified lung: Secondary | ICD-10-CM

## 2013-01-24 NOTE — Patient Instructions (Addendum)
Patient is to follow with Dr. Gaylyn Rong tomorrow. Patient most likely will benefit from referral back to Dr. Georgie Chard at [facility] for definitive evaluation and radiographs as indicated  Dr. Kristin Bruins

## 2013-01-25 ENCOUNTER — Other Ambulatory Visit (HOSPITAL_BASED_OUTPATIENT_CLINIC_OR_DEPARTMENT_OTHER): Payer: Medicaid Other

## 2013-01-25 ENCOUNTER — Encounter (HOSPITAL_COMMUNITY): Payer: Self-pay | Admitting: Dentistry

## 2013-01-25 ENCOUNTER — Ambulatory Visit (HOSPITAL_BASED_OUTPATIENT_CLINIC_OR_DEPARTMENT_OTHER): Payer: Medicaid Other | Admitting: Oncology

## 2013-01-25 ENCOUNTER — Telehealth: Payer: Self-pay | Admitting: Oncology

## 2013-01-25 VITALS — BP 116/69 | HR 74 | Temp 98.2°F | Resp 18 | Ht 72.0 in | Wt 147.4 lb

## 2013-01-25 DIAGNOSIS — C7951 Secondary malignant neoplasm of bone: Secondary | ICD-10-CM

## 2013-01-25 DIAGNOSIS — C029 Malignant neoplasm of tongue, unspecified: Secondary | ICD-10-CM

## 2013-01-25 DIAGNOSIS — R6884 Jaw pain: Secondary | ICD-10-CM

## 2013-01-25 DIAGNOSIS — C7800 Secondary malignant neoplasm of unspecified lung: Secondary | ICD-10-CM

## 2013-01-25 DIAGNOSIS — C770 Secondary and unspecified malignant neoplasm of lymph nodes of head, face and neck: Secondary | ICD-10-CM

## 2013-01-25 LAB — CBC WITH DIFFERENTIAL/PLATELET
BASO%: 0.5 % (ref 0.0–2.0)
LYMPH%: 19.1 % (ref 14.0–49.0)
MCHC: 34 g/dL (ref 32.0–36.0)
MONO#: 0.4 10*3/uL (ref 0.1–0.9)
RBC: 3.14 10*6/uL — ABNORMAL LOW (ref 4.20–5.82)
WBC: 3.6 10*3/uL — ABNORMAL LOW (ref 4.0–10.3)
lymph#: 0.7 10*3/uL — ABNORMAL LOW (ref 0.9–3.3)

## 2013-01-25 LAB — BASIC METABOLIC PANEL (CC13)
CO2: 31 mEq/L — ABNORMAL HIGH (ref 22–29)
Calcium: 9.3 mg/dL (ref 8.4–10.4)
Sodium: 141 mEq/L (ref 136–145)

## 2013-01-25 MED ORDER — FENTANYL 25 MCG/HR TD PT72: 1.0000 | MEDICATED_PATCH | TRANSDERMAL | Status: DC

## 2013-01-25 NOTE — Progress Notes (Signed)
01/25/2103  Patient:            Thomas Bean Date of Birth:  30-Jul-1961 MRN:                [id number]  BP 99/65  Pulse 73  Temp(Src) 98.4 F (36.9 C) (Oral)  Thomas Bean is a 52 year old male that presents for periodic oral exam and evaluation of left mandibular pain and numbness and dysesthesia . 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 Patient then had surgical resection with reconstruction of surgery involving the left mandible and lateral tongue at [facility].  Patient then underwent chemoradiation therapy from 11/15/2011 through 01/05/2012.  Patient was recently diagnosed with right neck metastasis that was followed by modified radical neck dissection. The patient was then noted to have lung and bone metastases also. Patient has been receiving chemotherapy with Dr. Gaylyn Rong. Patient was then evaluated as part of a pre-Zometa dental evaluation protocol. Patient has been undergoing active chemotherapy with Zometa use on every 6 week basis.  Recently developed left dental pain, numbness, and dysesthesia of the left mandible area over the last week or so. Patient now presents for evaluation of this left mandibular pain/numbness.  Patient Active Problem List   Diagnosis Date Noted  . Facial rash 05/24/2012  . Bone metastasis 04/25/2012  . Hx of radiation therapy   . Lesion of left lung 03/14/2012  . Cancer   . Abscess 12/27/2011  . Neutropenic fever 12/27/2011  . Mucositis 12/27/2011  . Neutropenia 12/23/2011  . Cancer of head, face, or neck lymph nodes, secondary 07/27/2011  . Squamous cell carcinoma of tongue 12/21/2010  . Anxiety 12/21/2010  . Tongue lesion 12/05/2010  . BACK PAIN 03/20/2008  . DEPRESSION 02/21/2008  . HEMORRHOIDS 02/21/2008  . INGUINAL HERNIA 02/21/2008  . DIVERTICULOSIS, COLON 02/21/2008  . IRRITABLE BOWEL SYNDROME 02/21/2008  . HYPERTENSION 03/12/2007   Medical Hx Update:  Past Medical  History  Diagnosis Date  . Hemorrhoids   . PONV (postoperative nausea and vomiting)   . Hypertension     no medications at present  . GERD (gastroesophageal reflux disease)     no medication  . Depression     treated approx 12 years ago, no treatment at this time  . Hiatal hernia   . Neutropenia 12/23/2011  . Abscess 12/27/2011  . Hx of radiation therapy 11/15/11 -01/05/12    left lateral tongue  . Lesion of left lung 03/14/12    CT chest, [facility], squamous cell carcinoma  . Facial rash 05/24/2012  . History of radiation therapy 07/24/12-07/26/12    left iliac bone, 18Gy/3 sessions  . Cancer 12/2010    left side of tongue T1 lesion excised 4/12  . Lung metastasis dx'd 03/2012  . Bone metastasis 04/25/12    PET scan-left iliac bone  . ALLERGIES/ADVERSE DRUG REACTIONS: Allergies  Allergen Reactions  . Codeine Phosphate     REACTION: rash, swelling  . Morphine And Related   . Tape     Blisters; please use paper tape   MEDICATIONS: Current Outpatient Prescriptions  Medication Sig Dispense Refill  . clindamycin (CLINDAGEL) 1 % gel Apply topically as needed. Apply to facial and chest skin rash.      . clobetasol cream (TEMOVATE) 0.05 % Apply topically as needed. Apply to rash BID      . gabapentin (NEURONTIN) 300 MG capsule Take 1 capsule (300 mg total) by mouth 3 (three) times daily.  90 capsule  3  . HYDROcodone-acetaminophen (NORCO/VICODIN) 5-325 MG per tablet Take 1 tablet by mouth every 6 (six) hours as needed for pain.  120 tablet  0  . hydrocortisone cream 0.5 % Apply topically 2 (two) times daily as needed. Apply to axilla rash twice daily as needed.      . lidocaine-prilocaine (EMLA) cream Apply topically as needed. Apply to [facility] site one hour prior to needle stick to numb skin.  30 g  3  . LORazepam (ATIVAN) 0.5 MG tablet Place 1 tablet (0.5 mg total) under the tongue every 6 (six) hours as needed for anxiety (nausea/vomiting).  60 tablet  5  . Nutritional Supplements  (FEEDING SUPPLEMENT, OSMOLITE 1.5 CAL,) LIQD Begin Osmolite 1.5 at 60 ml/hr for 12 hours nocturnal continuous feeding daily. Flush with 60 ml free water before and after continuous feedings. Continue 1 can of Osmolite 1.5 via gravity feeding TID with 60 ml free water before and after each bolus feeding. Please send pump to patient.  1422 mL  0  . omeprazole (PRILOSEC) 10 MG capsule Take 10 mg by mouth daily.      . ondansetron (ZOFRAN) 8 MG tablet Take 8 mg by mouth every 12 (twelve) hours as needed.      . prochlorperazine (COMPAZINE) 10 MG tablet Take 1 tablet (10 mg total) by mouth every 6 (six) hours as needed.  60 tablet  1  . sertraline (ZOLOFT) 50 MG tablet Take 50 mg by mouth daily.      . sodium fluoride (PREVIDENT 5000 PLUS) 1.1 % CREA dental cream Apply thin ribbon of cream to tooth brush. Brush teeth for 2 minutes. Spit out excess-DO NOT swallow. DO NOT rinse afterwards. Repeat nightly.  1 Tube  prn   No current facility-administered medications for this visit.    Lab Results  Component Value Date   WBC 3.6* 01/25/2013   HGB 10.7* 01/25/2013   HCT 31.4* 01/25/2013   MCV 100.0* 01/25/2013   PLT 220 01/25/2013   BMET    Component Value Date/Time   NA 140 01/14/2013 0856   NA 138 06/25/2012 0858   K 3.7 01/14/2013 0856   K 3.1* 06/25/2012 0858   CL 103 01/14/2013 0856   CL 99 06/25/2012 0858   CO2 28 01/14/2013 0856   CO2 30 06/25/2012 0858   GLUCOSE 73 01/14/2013 0856   GLUCOSE 88 06/25/2012 0858   BUN 8.1 01/14/2013 0856   BUN 10 06/25/2012 0858   CREATININE 0.6* 01/14/2013 0856   CREATININE 0.47* 06/25/2012 0858   CALCIUM 9.2 01/14/2013 0856   CALCIUM 9.6 06/25/2012 0858   GFRNONAA >90 12/29/2011 0350   GFRAA >90 12/29/2011 0350   Chief Complaint  Patient presents with  . Dental Pain    Periodic oral examination and evaluation of left mandibular pain and numbness   History of present illness: The patient is complaining of lower left jaw pain. Patient points to the area near the  angle of the mandible and ascending ramus. This appears to be at the junction of the previous surgical reconstruction margin. The patient indicates that the pain as both sharp and dull in nature. Patient indicates that it has significantly increased in pain over the past week or so. Patient indicates that the pain reaches an intensity of 7-8/10. Currently the pain as a 4/10 since he recently took some hydrocodone medication.  Patient also is describing some increased numbness with dysesthesias symptoms in this area. Patient indicates that  he reminds him earlier presentation with a cancer was diagnosed. The patient does indicate that he has always had numbness to the left lower face but this numbness is more significant over the past one week. Patient indicates that he has had to increase his pain medication over the past week to relieve the discomfort. Patient indicates that he is scheduled to see Dr. Gaylyn Rong tomorrow.  DENTAL EXAM: General: Patient is a well-developed, slightly built male in no acute distress.  Vitals: BP 99/65  Pulse 73  Temp(Src) 98.4 F (36.9 C) (Oral) Extraoral Exam: Patient with bilateral neck dissections.  Patient denies acute TMJ Symptoms. Patient is still doing his trismus exercises. Maximum interincisal opening is improved. Intraoral  Exam:  Patient has severe xerostomia. The left mandible area is consistent with previous surgical resection and reconstruction efforts.  Patient recently had a lip procedure at [facility] while undergoing right neck dissection. I do not see any exposed bone or evidence of osteonecrosis of the jaw related to Zometa therapy or osteoradionecrosis. Dentition:  Dentition as before. No increased tooth mobility or periapical pathology noted. Caries:  No caries noted. Endodontic:  There are no periapical radiolucencies. The patient denies acute toothache symptoms C&B:  crown on #30 with no acute changes Prosthodontic:No partials Occlusion:  Patient  has a malocclusion secondary to surgical resection changes occlusion.  This is stable however.  Radiographic Interpretation: Orthopantogram was obtained. No significant changes noted from last orthopantogram taken on 06/19/12.  Assessments: 1. Increase in pain of the left mandible. I am unsure of the etiology for this increased pain. Orthopantogram did not reveal any lytic changes, however, additional CT scan or possible MRI study may needed to further evaluate for cancer recurrence or persistence involving the left mandible and ascending ramus. 2. Numbness and dysesthesia in the area of the left mandible-unsure of etiology and this again may represent presence of cancer. 3. Severe xerostomia 4. Minimal plaque accumulations 5. Multiple missing teeth secondary to surgical resection 6. Malocclusion secondary to surgical resection and reconstruction. 7. No significant tooth mobility. 8. No current evidence of osteonecrosis of the jaw or osteoradionecrosis with exposed bone  Plan:  1. Patient is to follow with Dr. Gaylyn Rong as scheduled.  2. consider followup with Dr. Georgie Chard at [facility] as indicated for evaluation and additional radiographic studies. 3. Patient to continue brushing after meals and at bedtime. Patient to use fluoride therapy with the fluoride trays or use the PreviDent 5000 on toothbrush at bedtime.   Charlynne Pander, DDS

## 2013-01-25 NOTE — Telephone Encounter (Signed)
gv pt appt schedule for June. Pt aware central will call w/ct appt. Per 5/169 pof tx delayed by about 3wks. All tx moved accordingly.

## 2013-01-28 ENCOUNTER — Encounter: Payer: Self-pay | Admitting: Oncology

## 2013-01-28 ENCOUNTER — Ambulatory Visit: Payer: Self-pay | Admitting: Oncology

## 2013-01-28 ENCOUNTER — Other Ambulatory Visit: Payer: Medicaid Other | Admitting: Lab

## 2013-01-28 ENCOUNTER — Ambulatory Visit: Payer: Medicaid Other

## 2013-01-28 ENCOUNTER — Telehealth: Payer: Self-pay | Admitting: *Deleted

## 2013-01-28 ENCOUNTER — Other Ambulatory Visit: Payer: Self-pay | Admitting: Lab

## 2013-01-28 NOTE — Progress Notes (Signed)
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 He now developed biopsy proven metastatic disease in 03/2012 with met in right neck, left lung mass and left illac.   PAST THERAPY:  1. 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 He developed recurrence in the left submandibular gland in August 2012. He underwent on 07/27/2011 radical left neck dissection by Dr. Ezzard Standing and Dr. Geryl Rankins. Pathology case number [id number] showed total 15 lymph nodes from left radical neck dissection that was all negative. However the left submandibular gland mass showed a 3.5 cm invasive SCC, moderately differentiates involving the adjacent skeletal muscle tissue and bone a tissue and extending into the inked margin. There was no evidence of any lymphatic invasion or perineural invasion. He underwent further resection by Dr. Mary Sella at [facility] on 09/29/2011.  2. He received adjuvant chemoradiation therapy with q 3 week Cisplatin given 11/15/11-12/05/11 (received 2 of 3 doses; 3rd dose held due to febrile neutropenia and development of flap abscess). He received concurrent XRT 11/15/11 through 01/05/12.  3. For right neck recurrence in 03/2012; he underwent palliative right neck dissection by Dr. Manson Passey at [facility].   CURRENT THERAPY: started on 05/15/2012 palliative chemo weekly Carboplatin/Taxol/Erbitux; 3 weeks on; 1 week off.  Due to prolonged cytopenia, his chemo has been changed to weekly; 2 weeks on, 1 week off.   INTERVAL HISTORY: TRIP CAVANA[place] 52 y.o. male returns for regular follow up with his father.  He has been having moderate to severe left jaw pain.  He had been having pain in the left jaw ever since he had surgery in the past.   However, within the past week, the sharp/electric pain has worsened to almost 10/10; he has been needing extra Lortab.  He still has mild fatigue.  He is still independent of light activities of daily living.  He still depends on PEG tube for the majority of his nutritional need. He has stable Erbitux-induced rash on the face, chest, and back. He has constipation for which he takes laxatives a few times a week. The rest of the 14-point review of system was negative.  He was seen by Dr. Kristin Bruins who did not see exposed bone. His Panorex was negative.   Past Medical History  Diagnosis Date  . Hemorrhoids   . PONV (postoperative nausea and vomiting)   . Hypertension     no medications at present  . GERD (gastroesophageal reflux disease)     no medication  . Depression     treated approx 12 years ago, no treatment at this time  . Hiatal hernia   . Neutropenia 12/23/2011  . Abscess 12/27/2011  . Hx of radiation therapy 11/15/11 -01/05/12    left lateral tongue  . Lesion of left lung 03/14/12    CT chest, [facility], squamous cell carcinoma  . Facial rash 05/24/2012  . History of radiation therapy 07/24/12-07/26/12    left iliac bone, 18Gy/3 sessions  . Cancer 12/2010    left side of tongue T1 lesion excised 4/12  . Lung metastasis dx'd 03/2012  . Bone metastasis 04/25/12    PET scan-left iliac bone    Past Surgical History  Procedure Laterality Date  . Inguinal herniorrhapy    . Tonsillectomy  and adenoidectomy    . Excision of tongue mass      April 2012  . Hernia repair    . Neck mass excision  07/27/2011  . Radical neck dissection  07/27/2011    Procedure: RADICAL NECK DISSECTION;  Surgeon: Drema Halon, MD;  Location: [facility] OR;  Service: ENT;  Laterality: Left;  Marland Kitchen Mandibulectomy  09/2011    and scapular free flap  . Modified radical neck dissection  03/27/12    excision of lip lesion, lip benign, right neck squamous cell ca    Current Outpatient Prescriptions  Medication Sig Dispense  Refill  . clindamycin (CLINDAGEL) 1 % gel Apply topically as needed. Apply to facial and chest skin rash.      . clobetasol cream (TEMOVATE) 0.05 % Apply topically as needed. Apply to rash BID      . gabapentin (NEURONTIN) 300 MG capsule Take 1 capsule (300 mg total) by mouth 3 (three) times daily.  90 capsule  3  . HYDROcodone-acetaminophen (NORCO/VICODIN) 5-325 MG per tablet Take 1 tablet by mouth every 6 (six) hours as needed for pain.  120 tablet  0  . hydrocortisone cream 0.5 % Apply topically 2 (two) times daily as needed. Apply to axilla rash twice daily as needed.      . lidocaine-prilocaine (EMLA) cream Apply topically as needed. Apply to [facility] site one hour prior to needle stick to numb skin.  30 g  3  . LORazepam (ATIVAN) 0.5 MG tablet Place 1 tablet (0.5 mg total) under the tongue every 6 (six) hours as needed for anxiety (nausea/vomiting).  60 tablet  5  . Nutritional Supplements (FEEDING SUPPLEMENT, OSMOLITE 1.5 CAL,) LIQD Begin Osmolite 1.5 at 60 ml/hr for 12 hours nocturnal continuous feeding daily. Flush with 60 ml free water before and after continuous feedings. Continue 1 can of Osmolite 1.5 via gravity feeding TID with 60 ml free water before and after each bolus feeding. Please send pump to patient.  1422 mL  0  . omeprazole (PRILOSEC) 10 MG capsule Take 10 mg by mouth daily.      . ondansetron (ZOFRAN) 8 MG tablet Take 8 mg by mouth every 12 (twelve) hours as needed.      . prochlorperazine (COMPAZINE) 10 MG tablet Take 1 tablet (10 mg total) by mouth every 6 (six) hours as needed.  60 tablet  1  . sertraline (ZOLOFT) 50 MG tablet Take 50 mg by mouth daily.      . sodium fluoride (PREVIDENT 5000 PLUS) 1.1 % CREA dental cream Apply thin ribbon of cream to tooth brush. Brush teeth for 2 minutes. Spit out excess-DO NOT swallow. DO NOT rinse afterwards. Repeat nightly.  1 Tube  prn  . fentaNYL (DURAGESIC - DOSED MCG/HR) 25 MCG/HR Place 1 patch (25 mcg total) onto the skin every 3  (three) days.  10 patch  0   No current facility-administered medications for this visit.    ALLERGIES:  is allergic to codeine phosphate; morphine and related; and tape.  Filed Vitals:   01/25/13 1308  BP: 116/69  Pulse: 74  Temp: 98.2 F (36.8 C)  Resp: 18   Wt Readings from Last 3 Encounters:  01/25/13 147 lb 6.4 oz (66.86 kg)  01/04/13 140 lb 1.6 oz (63.549 kg)  01/02/13 141 lb (63.957 kg)   ECOG Performance status: 1-2  PHYSICAL EXAMINATION:   General: thin-appearing man, in no acute distress. Eyes: no scleral icterus. ENT: There were no oropharyngeal  lesions on my unaided exam.I did not see exposed jaw bone nor any mass.  Neck was without thyromegaly. There was a flap in the left jaw which has healed. Right neck was fibrotic from resection. There was no erythema, purulent discharge, pain on palpation. Lymphatics: no clear palpable adenopathy. Respiratory: lungs were clear bilaterally without wheezing or crackles. Cardiovascular: Regular rate and rhythm, S1/S2, without murmur, rub or gallop. There was no pedal edema. GI: abdomen was soft, flat, nontender, nondistended, without organomegaly. PEG tube was dry, clean, intact. Muscoloskeletal: no spinal tenderness of palpation of vertebral spine. Skin exam was without echymosis, petichae. There was mild aciniform skin rash on the face, scalp, and chest.   Neuro exam was nonfocal. Patient was able to get on and off exam table without assistance. Gait was normal. Patient was alerted and oriented. Attention was good. Language was appropriate. Mood was normal without depression. Speech was not pressured. Thought content was not tangential.    LABORATORY/RADIOLOGY DATA:  Lab Results  Component Value Date   WBC 3.6* 01/25/2013   HGB 10.7* 01/25/2013   HCT 31.4* 01/25/2013   PLT 220 01/25/2013   GLUCOSE 89 01/25/2013   CHOL 160 02/14/2008   TRIG 442* 02/14/2008   HDL 26.5* 02/14/2008   LDLDIRECT 64.8 02/14/2008   ALKPHOS 44 10/08/2012   ALT 12  10/08/2012   AST 16 10/08/2012   NA 141 01/25/2013   K 3.8 01/25/2013   CL 102 01/25/2013   CREATININE 0.6* 01/25/2013   BUN 8.4 01/25/2013   CO2 31* 01/25/2013   INR 0.99 08/20/2012    ASSESSMENT AND PLAN:   1. Smoking: none at this time.   2. Recurrent left lateral tongue squamous cell carcinoma: He now has metastatic disease to the lung and bone.  This last CT scan today showed partial disease response in the left lung mass and iliac bone.  -He has been on Carboplatin/Taxol/Ertibux.    3.  Jaw bone pain:  No clear etiology on exam today nor from dental exam.  I recommended holding chemo for further evaluation of this pain.  I referred him to neck CT with contrast within 1 week.  I advised him to make appointment with Dr. Manson Passey, his ENT from [facility] for urgent appointment.   4.  Bone met: limited.  S/p palliative XRT. He has been on Zometa.   4. Anemia: This is from chemo.  There is no active bleeding. No transfusion indicated.   5. Calorie/Protein malnutrition: weight is stable compared before.  6. Mouth pain from treatment and possibly recurrent disease:  I started him on Fentanyl patch 21mcg/hr in addition to continuing Lortab for breakthrough pain.   7. Skin rash: Due to Erbitux.  I advised him to continue Cleocin cream and Hydrocortisone cream prn.  I advised him also to put cream on the back.   8.   Follow up: in about 3 weeks.       The length of time of the face-to-face encounter was 25 minutes. More than 50% of time was spent counseling and coordination of care.        Huan T. Gaylyn Rong, M.D.

## 2013-01-28 NOTE — Telephone Encounter (Signed)
Pt sent several emails 1. Does CT scan of neck also include chest which is due in June?   2. His next chemo on 6/09 is correct, but not scheduled again until 6/30 and no lab on this date.  States chemo should be 2 weeks on and one week off.    3. Asking if we have made appt for him w/ Dr. Erroll Luna?   Called pt and informed him 1. CT this week is only of Neck, not chest, according to orders from Dr. Gaylyn Rong.    2. We will fix his chemo schedule when he sees dr. Gaylyn Rong again on 6/09.   3. I called and made appt w/ Dr. Erroll Luna for pt next avail on June 4th at 3:30 pm.     Pt verbalized understanding and reports his pain is much improved and can sleep thru the night now on the "patch."

## 2013-01-30 ENCOUNTER — Ambulatory Visit (HOSPITAL_COMMUNITY)
Admission: RE | Admit: 2013-01-30 | Discharge: 2013-01-30 | Disposition: A | Discharge status: Home / Self Care | Payer: Medicaid Other | Source: Ambulatory Visit | Attending: Oncology | Admitting: Oncology

## 2013-01-30 ENCOUNTER — Encounter: Payer: Self-pay | Admitting: Oncology

## 2013-01-30 DIAGNOSIS — C770 Secondary and unspecified malignant neoplasm of lymph nodes of head, face and neck: Secondary | ICD-10-CM

## 2013-01-30 DIAGNOSIS — Z923 Personal history of irradiation: Secondary | ICD-10-CM | POA: Insufficient documentation

## 2013-01-30 DIAGNOSIS — C029 Malignant neoplasm of tongue, unspecified: Secondary | ICD-10-CM | POA: Insufficient documentation

## 2013-01-30 DIAGNOSIS — C7951 Secondary malignant neoplasm of bone: Secondary | ICD-10-CM

## 2013-01-30 DIAGNOSIS — Z9221 Personal history of antineoplastic chemotherapy: Secondary | ICD-10-CM | POA: Insufficient documentation

## 2013-01-30 DIAGNOSIS — C50919 Malignant neoplasm of unspecified site of unspecified female breast: Secondary | ICD-10-CM | POA: Insufficient documentation

## 2013-01-30 MED ORDER — IOHEXOL 300 MG/ML  SOLN
100.0000 mL | Freq: Once | INTRAMUSCULAR | Status: AC | PRN
  Administered 2013-01-30: 100 mL via INTRAVENOUS

## 2013-01-31 ENCOUNTER — Telehealth: Payer: Self-pay | Admitting: *Deleted

## 2013-01-31 ENCOUNTER — Other Ambulatory Visit: Payer: Self-pay | Admitting: Oncology

## 2013-01-31 NOTE — Telephone Encounter (Signed)
Phone note opened in error.  

## 2013-02-01 ENCOUNTER — Other Ambulatory Visit: Payer: Self-pay | Admitting: Oncology

## 2013-02-01 ENCOUNTER — Telehealth: Payer: Self-pay | Admitting: *Deleted

## 2013-02-01 DIAGNOSIS — K1230 Oral mucositis (ulcerative), unspecified: Secondary | ICD-10-CM

## 2013-02-01 DIAGNOSIS — C770 Secondary and unspecified malignant neoplasm of lymph nodes of head, face and neck: Secondary | ICD-10-CM

## 2013-02-01 DIAGNOSIS — C029 Malignant neoplasm of tongue, unspecified: Secondary | ICD-10-CM

## 2013-02-01 MED ORDER — HYDROCODONE-ACETAMINOPHEN 5-325 MG PO TABS: 1.0000 | ORAL_TABLET | Freq: Four times a day (QID) | ORAL | Status: DC | PRN

## 2013-02-01 NOTE — Telephone Encounter (Signed)
Pt left VM asking for refill on Hydrocodone.  States the pharmacy told him the refill request was denied by Dr. Gaylyn Rong,  But Dr. Gaylyn Rong told him he could take it for breakthrough pain even while on the fentanyl patch.  Last refill was on 12/17/12.   He also wanted to confirm his next chemo is due to re start on 6/09 as scheduled?   Is this correct from Dr. Lodema Pilot standpoint? He does not have any chemo scheduled for the following week.

## 2013-02-01 NOTE — Telephone Encounter (Signed)
OK to refill pain med.  Please print it out for me.  I'll turn in a POF for visit and chemo on 02/18/13.  Thanks.

## 2013-02-01 NOTE — Telephone Encounter (Signed)
Called in refill on hydrocodone to [facility] per Dr. Gaylyn Rong.  Informed pt and also discussed next appt on 6/09 for lab/office visit and chemo.  Pt verbalized understanding.

## 2013-02-05 ENCOUNTER — Telehealth: Payer: Self-pay | Admitting: Oncology

## 2013-02-05 ENCOUNTER — Ambulatory Visit: Payer: Medicaid Other

## 2013-02-05 ENCOUNTER — Telehealth: Payer: Self-pay | Admitting: *Deleted

## 2013-02-05 NOTE — Telephone Encounter (Signed)
Per staff message and POF I have scheduled appts.  JMW  

## 2013-02-18 ENCOUNTER — Ambulatory Visit (HOSPITAL_BASED_OUTPATIENT_CLINIC_OR_DEPARTMENT_OTHER): Payer: Medicaid Other | Admitting: Oncology

## 2013-02-18 ENCOUNTER — Ambulatory Visit (HOSPITAL_BASED_OUTPATIENT_CLINIC_OR_DEPARTMENT_OTHER): Payer: Medicaid Other

## 2013-02-18 ENCOUNTER — Telehealth: Payer: Self-pay | Admitting: *Deleted

## 2013-02-18 ENCOUNTER — Telehealth: Payer: Self-pay | Admitting: Oncology

## 2013-02-18 ENCOUNTER — Other Ambulatory Visit (HOSPITAL_BASED_OUTPATIENT_CLINIC_OR_DEPARTMENT_OTHER): Payer: Medicaid Other | Admitting: Lab

## 2013-02-18 ENCOUNTER — Encounter: Payer: Self-pay | Admitting: Oncology

## 2013-02-18 VITALS — BP 109/71 | HR 88 | Temp 98.1°F | Resp 17 | Ht 72.0 in | Wt 150.8 lb

## 2013-02-18 DIAGNOSIS — C7951 Secondary malignant neoplasm of bone: Secondary | ICD-10-CM

## 2013-02-18 DIAGNOSIS — C770 Secondary and unspecified malignant neoplasm of lymph nodes of head, face and neck: Secondary | ICD-10-CM

## 2013-02-18 DIAGNOSIS — C7952 Secondary malignant neoplasm of bone marrow: Secondary | ICD-10-CM

## 2013-02-18 DIAGNOSIS — R6884 Jaw pain: Secondary | ICD-10-CM

## 2013-02-18 DIAGNOSIS — D6481 Anemia due to antineoplastic chemotherapy: Secondary | ICD-10-CM

## 2013-02-18 DIAGNOSIS — C7800 Secondary malignant neoplasm of unspecified lung: Secondary | ICD-10-CM

## 2013-02-18 DIAGNOSIS — C029 Malignant neoplasm of tongue, unspecified: Secondary | ICD-10-CM

## 2013-02-18 DIAGNOSIS — Z5112 Encounter for antineoplastic immunotherapy: Secondary | ICD-10-CM

## 2013-02-18 DIAGNOSIS — Z5111 Encounter for antineoplastic chemotherapy: Secondary | ICD-10-CM

## 2013-02-18 LAB — BASIC METABOLIC PANEL (CC13)
BUN: 8.2 mg/dL (ref 7.0–26.0)
CO2: 29 mEq/L (ref 22–29)
Calcium: 9.6 mg/dL (ref 8.4–10.4)
Chloride: 103 mEq/L (ref 98–107)
Creatinine: 0.6 mg/dL — ABNORMAL LOW (ref 0.7–1.3)

## 2013-02-18 LAB — CBC WITH DIFFERENTIAL/PLATELET
BASO%: 0.3 % (ref 0.0–2.0)
Basophils Absolute: 0 10*3/uL (ref 0.0–0.1)
EOS%: 3.7 % (ref 0.0–7.0)
HCT: 30.8 % — ABNORMAL LOW (ref 38.4–49.9)
HGB: 9.9 g/dL — ABNORMAL LOW (ref 13.0–17.1)
LYMPH%: 13.1 % — ABNORMAL LOW (ref 14.0–49.0)
MCH: 32.4 pg (ref 27.2–33.4)
MCHC: 32.1 g/dL (ref 32.0–36.0)
NEUT%: 72.3 % (ref 39.0–75.0)
Platelets: 197 10*3/uL (ref 140–400)
lymph#: 0.8 10*3/uL — ABNORMAL LOW (ref 0.9–3.3)

## 2013-02-18 MED ORDER — FAMOTIDINE IN NACL 20-0.9 MG/50ML-% IV SOLN
20.0000 mg | Freq: Once | INTRAVENOUS | Status: AC
Start: 2013-02-18 — End: 2013-02-18
  Administered 2013-02-18: 20 mg via INTRAVENOUS

## 2013-02-18 MED ORDER — SODIUM CHLORIDE 0.9 % IJ SOLN
10.0000 mL | INTRAMUSCULAR | Status: DC | PRN
  Administered 2013-02-18: 10 mL
  Filled 2013-02-18: qty 10

## 2013-02-18 MED ORDER — DIPHENHYDRAMINE HCL 50 MG/ML IJ SOLN
50.0000 mg | Freq: Once | INTRAMUSCULAR | Status: AC
Start: 2013-02-18 — End: 2013-02-18
  Administered 2013-02-18: 50 mg via INTRAVENOUS

## 2013-02-18 MED ORDER — SODIUM CHLORIDE 0.9 % IV SOLN
180.0000 mg | Freq: Once | INTRAVENOUS | Status: AC
  Administered 2013-02-18: 180 mg via INTRAVENOUS
  Filled 2013-02-18: qty 18

## 2013-02-18 MED ORDER — ZOLEDRONIC ACID 4 MG/100ML IV SOLN
4.0000 mg | Freq: Once | INTRAVENOUS | Status: AC
  Administered 2013-02-18: 4 mg via INTRAVENOUS
  Filled 2013-02-18: qty 100

## 2013-02-18 MED ORDER — SODIUM CHLORIDE 0.9 % IV SOLN
48.0000 mg/m2 | Freq: Once | INTRAVENOUS | Status: AC
  Administered 2013-02-18: 90 mg via INTRAVENOUS
  Filled 2013-02-18: qty 15

## 2013-02-18 MED ORDER — FENTANYL 25 MCG/HR TD PT72: 1.0000 | MEDICATED_PATCH | TRANSDERMAL | Status: DC

## 2013-02-18 MED ORDER — CETUXIMAB CHEMO IV INJECTION 200 MG/100ML
166.6667 mg/m2 | Freq: Once | INTRAVENOUS | Status: AC
  Administered 2013-02-18: 300 mg via INTRAVENOUS
  Filled 2013-02-18: qty 150

## 2013-02-18 MED ORDER — CARBOPLATIN CHEMO INTRADERMAL TEST DOSE 100MCG/0.02ML
100.0000 ug | Freq: Once | INTRADERMAL | Status: AC
  Administered 2013-02-18: 100 ug via INTRADERMAL
  Filled 2013-02-18: qty 0.01

## 2013-02-18 MED ORDER — SODIUM CHLORIDE 0.9 % IV SOLN
Freq: Once | INTRAVENOUS | Status: AC
  Administered 2013-02-18: 10:00:00 via INTRAVENOUS

## 2013-02-18 MED ORDER — ONDANSETRON 16 MG/50ML IVPB (CHCC)
16.0000 mg | Freq: Once | INTRAVENOUS | Status: AC
  Administered 2013-02-18: 16 mg via INTRAVENOUS

## 2013-02-18 MED ORDER — HEPARIN SOD (PORK) LOCK FLUSH 100 UNIT/ML IV SOLN
500.0000 [IU] | Freq: Once | INTRAVENOUS | Status: AC | PRN
  Administered 2013-02-18: 500 [IU]
  Filled 2013-02-18: qty 5

## 2013-02-18 MED ORDER — DEXAMETHASONE SODIUM PHOSPHATE 20 MG/5ML IJ SOLN
20.0000 mg | Freq: Once | INTRAMUSCULAR | Status: AC
  Administered 2013-02-18: 20 mg via INTRAVENOUS

## 2013-02-18 NOTE — Progress Notes (Signed)
1046 Carbo skin test initiated to R forearm 1051 5 minute results unremarkable.  1101 15 minute result unremarkable. 1116 30 minute result unremarkable.

## 2013-02-18 NOTE — Patient Instructions (Addendum)
[facility] Discharge Instructions for Patients Receiving Chemotherapy  Today you received the following chemotherapy agents and treatment;  Erbitux, Taxol, Carboplatin and Zometa.  To help prevent nausea and vomiting after your treatment, we encourage you to take your nausea medication.  If you develop nausea and vomiting that is not controlled by your nausea medication, call the clinic.   BELOW ARE SYMPTOMS THAT SHOULD BE REPORTED IMMEDIATELY:  *FEVER GREATER THAN 100.5 F  *CHILLS WITH OR WITHOUT FEVER  NAUSEA AND VOMITING THAT IS NOT CONTROLLED WITH YOUR NAUSEA MEDICATION  *UNUSUAL SHORTNESS OF BREATH  *UNUSUAL BRUISING OR BLEEDING  TENDERNESS IN MOUTH AND THROAT WITH OR WITHOUT PRESENCE OF ULCERS  *URINARY PROBLEMS  *BOWEL PROBLEMS  UNUSUAL RASH Items with * indicate a potential emergency and should be followed up as soon as possible.  Feel free to call the clinic you have any questions or concerns. The clinic phone number is [phone].  Zoledronic Acid injection (Hypercalcemia, Oncology) ZOLEDRONIC ACID (ZOE le dron ik AS id) lowers the amount of calcium loss from bone. It is used to treat too much calcium in your blood from cancer. It is also used to prevent complications of cancer that has spread to the bone. This medicine may be used for other purposes; ask your health care provider or pharmacist if you have questions. What should I tell my health care provider before I take this medicine? They need to know if you have any of these conditions: -aspirin-sensitive asthma -dental disease -kidney disease -an unusual or allergic reaction to zoledronic acid, other medicines, foods, dyes, or preservatives -pregnant or trying to get pregnant -breast-feeding How should I use this medicine? This medicine is for infusion into a vein. It is given by a health care professional in a hospital or clinic setting. Talk to your pediatrician regarding the use of  this medicine in children. Special care may be needed. Overdosage: If you think you have taken too much of this medicine contact a poison control center or emergency room at once. NOTE: This medicine is only for you. Do not share this medicine with others. What if I miss a dose? It is important not to miss your dose. Call your doctor or health care professional if you are unable to keep an appointment. What may interact with this medicine? -certain antibiotics given by injection -NSAIDs, medicines for pain and inflammation, like ibuprofen or naproxen -some diuretics like bumetanide, furosemide -teriparatide -thalidomide This list may not describe all possible interactions. Give your health care provider a list of all the medicines, herbs, non-prescription drugs, or dietary supplements you use. Also tell them if you smoke, drink alcohol, or use illegal drugs. Some items may interact with your medicine. What should I watch for while using this medicine? Visit your doctor or health care professional for regular checkups. It may be some time before you see the benefit from this medicine. Do not stop taking your medicine unless your doctor tells you to. Your doctor may order blood tests or other tests to see how you are doing. Women should inform their doctor if they wish to become pregnant or think they might be pregnant. There is a potential for serious side effects to an unborn child. Talk to your health care professional or pharmacist for more information. You should make sure that you get enough calcium and vitamin D while you are taking this medicine. Discuss the foods you eat and the vitamins you take with your health care professional.  Some people who take this medicine have severe bone, joint, and/or muscle pain. This medicine may also increase your risk for a broken thigh bone. Tell your doctor right away if you have pain in your upper leg or groin. Tell your doctor if you have any pain that  does not go away or that gets worse. What side effects may I notice from receiving this medicine? Side effects that you should report to your doctor or health care professional as soon as possible: -allergic reactions like skin rash, itching or hives, swelling of the face, lips, or tongue -anxiety, confusion, or depression -breathing problems -changes in vision -feeling faint or lightheaded, falls -jaw burning, cramping, pain -muscle cramps, stiffness, or weakness -trouble passing urine or change in the amount of urine Side effects that usually do not require medical attention (report to your doctor or health care professional if they continue or are bothersome): -bone, joint, or muscle pain -fever -hair loss -irritation at site where injected -loss of appetite -nausea, vomiting -stomach upset -tired This list may not describe all possible side effects. Call your doctor for medical advice about side effects. You may report side effects to FDA at 1-800-FDA-1088. Where should I keep my medicine? This drug is given in a hospital or clinic and will not be stored at home. NOTE: This sheet is a summary. It may not cover all possible information. If you have questions about this medicine, talk to your doctor, pharmacist, or health care provider.  2012, [employer]/Gold Standard. (02/25/2011 9:06:58 AM)

## 2013-02-18 NOTE — Telephone Encounter (Signed)
gv and printed appt sched and avs for pt....eamiled MW to move tx after MD visit on 6.30.14...Marland Kitchenpt wants to pick up Barium later

## 2013-02-18 NOTE — Progress Notes (Signed)
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 He now developed biopsy proven metastatic disease in 03/2012 with met in right neck, left lung mass and left illac.   PAST THERAPY:  1. 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 He developed recurrence in the left submandibular gland in August 2012. He underwent on 07/27/2011 radical left neck dissection by Dr. Ezzard Standing and Dr. Geryl Rankins. Pathology case number [id number] showed total 15 lymph nodes from left radical neck dissection that was all negative. However the left submandibular gland mass showed a 3.5 cm invasive SCC, moderately differentiates involving the adjacent skeletal muscle tissue and bone a tissue and extending into the inked margin. There was no evidence of any lymphatic invasion or perineural invasion. He underwent further resection by Dr. Mary Sella at [facility] on 09/29/2011.  2. He received adjuvant chemoradiation therapy with q 3 week Cisplatin given 11/15/11-12/05/11 (received 2 of 3 doses; 3rd dose held due to febrile neutropenia and development of flap abscess). He received concurrent XRT 11/15/11 through 01/05/12.  3. For right neck recurrence in 03/2012; he underwent palliative right neck dissection by Dr. Manson Passey at [facility].   CURRENT THERAPY: started on 05/15/2012 palliative chemo weekly Carboplatin/Taxol/Erbitux; 3 weeks on; 1 week off.  Due to prolonged cytopenia, his chemo has been changed to weekly; 2 weeks on, 1 week off.   INTERVAL HISTORY: Thomas Bean 52 y.o. male returns for regular follow up with his father.  He was seen by Dr. Manson Passey, ENT from [facility] who reviewed the radiological studies; examined patient, and concluded that he had neuralgia.   Since starting on Gabapentin and Fentanyl, the left jaw pain has significantly improved.  He is able to eat ice cream.  He still uses his PEG tube for inputting about 5-6 cans boost/day.  His weight is increasing.  The skin rash on his face, chest, and back has significantly improved as well with chemo holiday.  His fatigue also improved off of chemo.  He is more active and able to do chore the last week.   Patient denies fever, headache, visual changes, confusion, drenching night sweats, palpable lymph node swelling, mucositis, odynophagia, dysphagia, nausea vomiting, jaundice, chest pain, palpitation, shortness of breath, dyspnea on exertion, productive cough, gum bleeding, epistaxis, hematemesis, hemoptysis, abdominal pain, abdominal swelling, early satiety, melena, hematochezia, hematuria, spontaneous bleeding, joint swelling, joint pain, heat or cold intolerance, bowel bladder incontinence, back pain, focal motor weakness, paresthesia, depression.    Past Medical History  Diagnosis Date  . Hemorrhoids   . Hypertension     no medications at present  . GERD (gastroesophageal reflux disease)     no medication  . Depression     treated approx 12 years ago, no treatment at this time  . Hiatal hernia   . Hx of radiation therapy 11/15/11 -01/05/12    left lateral tongue  . Lesion of left lung 03/14/12    CT chest, [facility], squamous cell carcinoma  . Facial rash 05/24/2012  . History of radiation therapy 07/24/12-07/26/12    left iliac bone, 18Gy/3 sessions  . Tongue cancer 12/2010    left side of tongue T1 lesion excised 4/12  . Lung metastasis dx'd 03/2012  . Bone metastasis 04/25/12  PET scan-left iliac bone    Past Surgical History  Procedure Laterality Date  . Inguinal herniorrhapy    . Tonsillectomy and adenoidectomy    . Excision of tongue mass      April 2012  . Hernia repair    . Neck mass excision  07/27/2011  . Radical neck dissection  07/27/2011    Procedure: RADICAL NECK  DISSECTION;  Surgeon: Drema Halon, MD;  Location: [facility] OR;  Service: ENT;  Laterality: Left;  Marland Kitchen Mandibulectomy  09/2011    and scapular free flap  . Modified radical neck dissection  03/27/12    excision of lip lesion, lip benign, right neck squamous cell ca    Current Outpatient Prescriptions  Medication Sig Dispense Refill  . fentaNYL (DURAGESIC - DOSED MCG/HR) 25 MCG/HR Place 1 patch (25 mcg total) onto the skin every 3 (three) days.  10 patch  0  . gabapentin (NEURONTIN) 300 MG capsule Take 1 capsule (300 mg total) by mouth 3 (three) times daily.  90 capsule  3  . HYDROcodone-acetaminophen (NORCO/VICODIN) 5-325 MG per tablet Take 1 tablet by mouth every 6 (six) hours as needed for pain.  90 tablet  0  . lidocaine-prilocaine (EMLA) cream Apply topically as needed. Apply to [facility] [address] site one hour prior to needle stick to numb skin.  30 g  3  . LORazepam (ATIVAN) 0.5 MG tablet Place 1 tablet (0.5 mg total) under the tongue every 6 (six) hours as needed for anxiety (nausea/vomiting).  60 tablet  5  . Nutritional Supplements (FEEDING SUPPLEMENT, OSMOLITE 1.5 CAL,) LIQD Begin Osmolite 1.5 at 60 ml/hr for 12 hours nocturnal continuous feeding daily. Flush with 60 ml free water before and after continuous feedings. Continue 1 can of Osmolite 1.5 via gravity feeding TID with 60 ml free water before and after each bolus feeding. Please send pump to patient.  1422 mL  0  . omeprazole (PRILOSEC) 10 MG capsule Take 10 mg by mouth daily.      . ondansetron (ZOFRAN) 8 MG tablet Take 8 mg by mouth every 12 (twelve) hours as needed.      . prochlorperazine (COMPAZINE) 10 MG tablet Take 1 tablet (10 mg total) by mouth every 6 (six) hours as needed.  60 tablet  1  . sertraline (ZOLOFT) 50 MG tablet Take 75 mg by mouth daily.       . sodium fluoride (PREVIDENT 5000 PLUS) 1.1 % CREA dental cream Apply thin ribbon of cream to tooth brush. Brush teeth for 2 minutes. Spit out excess-DO NOT swallow. DO NOT  rinse afterwards. Repeat nightly.  1 Tube  prn  . clindamycin (CLINDAGEL) 1 % gel Apply topically as needed. Apply to facial and chest skin rash.      . clobetasol cream (TEMOVATE) 0.05 % Apply topically as needed. Apply to rash BID      . hydrocortisone cream 0.5 % Apply topically 2 (two) times daily as needed. Apply to axilla rash twice daily as needed.       No current facility-administered medications for this visit.    ALLERGIES:  is allergic to codeine phosphate; morphine and related; and tape.  Filed Vitals:   02/18/13 0840  BP: 109/71  Pulse: 88  Temp: 98.1 F (36.7 C)  Resp: 17   Wt Readings from Last 3 Encounters:  02/18/13 150 lb 12.8 oz (68.402 kg)  01/25/13 147 lb 6.4 oz (66.86 kg)  01/04/13 140 lb 1.6 oz (63.549 kg)  ECOG Performance status: 1-2  PHYSICAL EXAMINATION:   General: thin-appearing man, in no acute distress. Eyes: no scleral icterus. ENT: There were no oropharyngeal lesions on my unaided exam except for post op changes.   Neck was without thyromegaly. There was a flap in the left jaw which has healed. Right neck was fibrotic from resection. There was no erythema, purulent discharge, pain on palpation. Lymphatics: no clear palpable adenopathy. Respiratory: lungs were clear bilaterally without wheezing or crackles. Cardiovascular: Regular rate and rhythm, S1/S2, without murmur, rub or gallop. There was no pedal edema. GI: abdomen was soft, flat, nontender, nondistended, without organomegaly. PEG tube was dry, clean, intact. Muscoloskeletal: no spinal tenderness of palpation of vertebral spine. Skin exam was without echymosis, petichae. There was mild aciniform skin rash on the chest and back which have significantly improved compared to while he was on chemo.  His facial and scalp rash has resolved.  Neuro exam was nonfocal. Patient was able to get on and off exam table without assistance. Gait was normal. Patient was alerted and oriented. Attention was good.  Language was appropriate. Mood was normal without depression. Speech was not pressured. Thought content was not tangential.    LABORATORY/RADIOLOGY DATA:  Lab Results  Component Value Date   WBC 6.0 02/18/2013   HGB 9.9* 02/18/2013   HCT 30.8* 02/18/2013   PLT 197 02/18/2013   GLUCOSE 89 01/25/2013   CHOL 160 02/14/2008   TRIG 442* 02/14/2008   HDL 26.5* 02/14/2008   LDLDIRECT 64.8 02/14/2008   ALKPHOS 44 10/08/2012   ALT 12 10/08/2012   AST 16 10/08/2012   NA 141 01/25/2013   K 3.8 01/25/2013   CL 102 01/25/2013   CREATININE 0.6* 01/25/2013   BUN 8.4 01/25/2013   CO2 31* 01/25/2013   INR 0.99 08/20/2012    ASSESSMENT AND PLAN:   1. Smoking: none at this time.   2. Recurrent left lateral tongue squamous cell carcinoma: He now has metastatic disease to the lung and bone.  This last CT scan today showed partial disease response in the left lung mass and iliac bone.  -He has been on Carboplatin/Taxol/Ertibux.   -There was no evidence of abscess in the left jaw.  Pain was thought to be due to neuralgia.   - I recommended to resume chemo Carboplatin/Taxol/Erbitux at same dose compared to prior to stopping chemo.  If and when his skin rash from Erbitux becomes severe, I may consider holding Erbitux and continue chemo Carboplatin/Taxol - I recommended that he establish care with Dr. Elaina Pattee, [facility] Head/Neck oncologist so that he can participate in clinical trial if and when his disease progresses on current chemo.  - His last restaging CT was in 10/2012.  I requested restaging CT chest/abd/pel later in June 2014 to ascertain response to present chemo.   3.  Jaw bone pain due to neuralgia: I advised him to continue gabapentin.  Since his pain is better, he may consider going off Fentanyl patch in a few weeks and see how his pain is controlled on Lortab and gabapentin.   4.  Bone met: limited.  S/p palliative XRT. He has been on Zometa every 6 weeks.   4. Anemia: This is from chemo.  There is no active  bleeding. No transfusion indicated.   5. Mild calorie/Protein malnutrition: weight is improved compared before.  6. Skin rash: Due to Erbitux.  He is on Cleocin cream and Hydrocortisone cream prn.   7.   Follow up: in  about 3 weeks.   8.  Code status:  Need to be discussed after restaging CT scan later this month.      The length of time of the face-to-face encounter was 25 minutes. More than 50% of time was spent counseling and coordination of care.        Thomas Bean, M.D.

## 2013-02-18 NOTE — Telephone Encounter (Signed)
Per staff message and POF I have adjusted appts. JMW  

## 2013-02-25 ENCOUNTER — Other Ambulatory Visit (HOSPITAL_BASED_OUTPATIENT_CLINIC_OR_DEPARTMENT_OTHER): Payer: Medicaid Other | Admitting: Lab

## 2013-02-25 ENCOUNTER — Ambulatory Visit (HOSPITAL_BASED_OUTPATIENT_CLINIC_OR_DEPARTMENT_OTHER): Payer: Medicaid Other

## 2013-02-25 ENCOUNTER — Other Ambulatory Visit: Payer: Self-pay | Admitting: Oncology

## 2013-02-25 ENCOUNTER — Encounter: Payer: Self-pay | Admitting: *Deleted

## 2013-02-25 VITALS — BP 110/70 | HR 83 | Temp 98.5°F

## 2013-02-25 DIAGNOSIS — C7951 Secondary malignant neoplasm of bone: Secondary | ICD-10-CM

## 2013-02-25 DIAGNOSIS — C029 Malignant neoplasm of tongue, unspecified: Secondary | ICD-10-CM

## 2013-02-25 DIAGNOSIS — C7800 Secondary malignant neoplasm of unspecified lung: Secondary | ICD-10-CM

## 2013-02-25 DIAGNOSIS — Z5111 Encounter for antineoplastic chemotherapy: Secondary | ICD-10-CM

## 2013-02-25 DIAGNOSIS — C7952 Secondary malignant neoplasm of bone marrow: Secondary | ICD-10-CM

## 2013-02-25 DIAGNOSIS — C770 Secondary and unspecified malignant neoplasm of lymph nodes of head, face and neck: Secondary | ICD-10-CM

## 2013-02-25 DIAGNOSIS — Z5112 Encounter for antineoplastic immunotherapy: Secondary | ICD-10-CM

## 2013-02-25 LAB — CBC WITH DIFFERENTIAL/PLATELET
BASO%: 0.7 % (ref 0.0–2.0)
LYMPH%: 12.9 % — ABNORMAL LOW (ref 14.0–49.0)
MCHC: 32.5 g/dL (ref 32.0–36.0)
MCV: 99.1 fL — ABNORMAL HIGH (ref 79.3–98.0)
MONO%: 6.4 % (ref 0.0–14.0)
Platelets: 193 10*3/uL (ref 140–400)
RBC: 3.2 10*6/uL — ABNORMAL LOW (ref 4.20–5.82)
RDW: 13.6 % (ref 11.0–14.6)
WBC: 5.4 10*3/uL (ref 4.0–10.3)
nRBC: 0 % (ref 0–0)

## 2013-02-25 LAB — COMPREHENSIVE METABOLIC PANEL (CC13)
ALT: 15 U/L (ref 0–55)
AST: 19 U/L (ref 5–34)
Alkaline Phosphatase: 52 U/L (ref 40–150)
Creatinine: 0.6 mg/dL — ABNORMAL LOW (ref 0.7–1.3)
Sodium: 138 mEq/L (ref 136–145)
Total Bilirubin: 0.26 mg/dL (ref 0.20–1.20)

## 2013-02-25 MED ORDER — HEPARIN SOD (PORK) LOCK FLUSH 100 UNIT/ML IV SOLN
500.0000 [IU] | Freq: Once | INTRAVENOUS | Status: AC | PRN
  Administered 2013-02-25: 500 [IU]
  Filled 2013-02-25: qty 5

## 2013-02-25 MED ORDER — SODIUM CHLORIDE 0.9 % IV SOLN
180.0000 mg | Freq: Once | INTRAVENOUS | Status: AC
  Administered 2013-02-25: 180 mg via INTRAVENOUS
  Filled 2013-02-25: qty 18

## 2013-02-25 MED ORDER — SODIUM CHLORIDE 0.9 % IV SOLN
Freq: Once | INTRAVENOUS | Status: AC
  Administered 2013-02-25: 09:00:00 via INTRAVENOUS

## 2013-02-25 MED ORDER — ONDANSETRON 16 MG/50ML IVPB (CHCC)
16.0000 mg | Freq: Once | INTRAVENOUS | Status: AC
  Administered 2013-02-25: 16 mg via INTRAVENOUS

## 2013-02-25 MED ORDER — DIPHENHYDRAMINE HCL 50 MG/ML IJ SOLN
50.0000 mg | Freq: Once | INTRAMUSCULAR | Status: AC
  Administered 2013-02-25: 50 mg via INTRAVENOUS

## 2013-02-25 MED ORDER — SODIUM CHLORIDE 0.9 % IJ SOLN
10.0000 mL | INTRAMUSCULAR | Status: DC | PRN
  Administered 2013-02-25: 10 mL
  Filled 2013-02-25: qty 10

## 2013-02-25 MED ORDER — CARBOPLATIN CHEMO INTRADERMAL TEST DOSE 100MCG/0.02ML
100.0000 ug | Freq: Once | INTRADERMAL | Status: AC
  Administered 2013-02-25: 100 ug via INTRADERMAL
  Filled 2013-02-25: qty 0.01

## 2013-02-25 MED ORDER — DEXAMETHASONE SODIUM PHOSPHATE 20 MG/5ML IJ SOLN
20.0000 mg | Freq: Once | INTRAMUSCULAR | Status: AC
  Administered 2013-02-25: 20 mg via INTRAVENOUS

## 2013-02-25 MED ORDER — FAMOTIDINE IN NACL 20-0.9 MG/50ML-% IV SOLN
20.0000 mg | Freq: Once | INTRAVENOUS | Status: AC
  Administered 2013-02-25: 20 mg via INTRAVENOUS

## 2013-02-25 MED ORDER — SODIUM CHLORIDE 0.9 % IV SOLN
48.0000 mg/m2 | Freq: Once | INTRAVENOUS | Status: AC
  Administered 2013-02-25: 90 mg via INTRAVENOUS
  Filled 2013-02-25: qty 15

## 2013-02-25 MED ORDER — CETUXIMAB CHEMO IV INJECTION 200 MG/100ML
166.6667 mg/m2 | Freq: Once | INTRAVENOUS | Status: AC
  Administered 2013-02-25: 300 mg via INTRAVENOUS
  Filled 2013-02-25: qty 150

## 2013-02-25 NOTE — Patient Instructions (Addendum)
[facility] Discharge Instructions for Patients Receiving Chemotherapy  Today you received the following chemotherapy agents erbirtux, taxol and carboplatin. To help prevent nausea and vomiting after your treatment, we encourage you to take your nausea medication zofran and compazine.   If you develop nausea and vomiting that is not controlled by your nausea medication, call the clinic.   BELOW ARE SYMPTOMS THAT SHOULD BE REPORTED IMMEDIATELY:  *FEVER GREATER THAN 100.5 F  *CHILLS WITH OR WITHOUT FEVER  NAUSEA AND VOMITING THAT IS NOT CONTROLLED WITH YOUR NAUSEA MEDICATION  *UNUSUAL SHORTNESS OF BREATH  *UNUSUAL BRUISING OR BLEEDING  TENDERNESS IN MOUTH AND THROAT WITH OR WITHOUT PRESENCE OF ULCERS  *URINARY PROBLEMS  *BOWEL PROBLEMS  UNUSUAL RASH Items with * indicate a potential emergency and should be followed up as soon as possible.  Feel free to call the clinic you have any questions or concerns. The clinic phone number is [phone].

## 2013-02-25 NOTE — Progress Notes (Signed)
Called in [place] to [facility] [address]. Patient notified.

## 2013-02-26 ENCOUNTER — Other Ambulatory Visit: Payer: Self-pay | Admitting: Certified Registered Nurse Anesthetist

## 2013-02-28 ENCOUNTER — Encounter: Payer: Self-pay | Admitting: Oncology

## 2013-02-28 ENCOUNTER — Other Ambulatory Visit: Payer: Self-pay | Admitting: *Deleted

## 2013-02-28 DIAGNOSIS — C770 Secondary and unspecified malignant neoplasm of lymph nodes of head, face and neck: Secondary | ICD-10-CM

## 2013-02-28 DIAGNOSIS — K148 Other diseases of tongue: Secondary | ICD-10-CM

## 2013-02-28 DIAGNOSIS — C801 Malignant (primary) neoplasm, unspecified: Secondary | ICD-10-CM

## 2013-02-28 MED ORDER — CLINDAMYCIN PHOSPHATE 1 % EX GEL: CUTANEOUS | Status: DC | PRN

## 2013-03-01 ENCOUNTER — Ambulatory Visit (HOSPITAL_COMMUNITY)
Admission: RE | Admit: 2013-03-01 | Discharge: 2013-03-01 | Disposition: A | Discharge status: Home / Self Care | Payer: Medicaid Other | Source: Ambulatory Visit | Attending: Oncology | Admitting: Oncology

## 2013-03-01 ENCOUNTER — Encounter: Payer: Self-pay | Admitting: *Deleted

## 2013-03-01 DIAGNOSIS — D739 Disease of spleen, unspecified: Secondary | ICD-10-CM | POA: Insufficient documentation

## 2013-03-01 DIAGNOSIS — I708 Atherosclerosis of other arteries: Secondary | ICD-10-CM | POA: Insufficient documentation

## 2013-03-01 DIAGNOSIS — I700 Atherosclerosis of aorta: Secondary | ICD-10-CM | POA: Insufficient documentation

## 2013-03-01 DIAGNOSIS — C779 Secondary and unspecified malignant neoplasm of lymph node, unspecified: Secondary | ICD-10-CM | POA: Insufficient documentation

## 2013-03-01 DIAGNOSIS — Z931 Gastrostomy status: Secondary | ICD-10-CM | POA: Insufficient documentation

## 2013-03-01 DIAGNOSIS — N281 Cyst of kidney, acquired: Secondary | ICD-10-CM | POA: Insufficient documentation

## 2013-03-01 DIAGNOSIS — Z923 Personal history of irradiation: Secondary | ICD-10-CM | POA: Insufficient documentation

## 2013-03-01 DIAGNOSIS — C029 Malignant neoplasm of tongue, unspecified: Secondary | ICD-10-CM

## 2013-03-01 DIAGNOSIS — C770 Secondary and unspecified malignant neoplasm of lymph nodes of head, face and neck: Secondary | ICD-10-CM

## 2013-03-01 DIAGNOSIS — C7952 Secondary malignant neoplasm of bone marrow: Secondary | ICD-10-CM | POA: Insufficient documentation

## 2013-03-01 DIAGNOSIS — C7800 Secondary malignant neoplasm of unspecified lung: Secondary | ICD-10-CM | POA: Insufficient documentation

## 2013-03-01 DIAGNOSIS — C7951 Secondary malignant neoplasm of bone: Secondary | ICD-10-CM | POA: Insufficient documentation

## 2013-03-01 DIAGNOSIS — R6884 Jaw pain: Secondary | ICD-10-CM

## 2013-03-01 DIAGNOSIS — Z79899 Other long term (current) drug therapy: Secondary | ICD-10-CM | POA: Insufficient documentation

## 2013-03-01 MED ORDER — IOHEXOL 300 MG/ML  SOLN
100.0000 mL | Freq: Once | INTRAMUSCULAR | Status: AC | PRN
  Administered 2013-03-01: 100 mL via INTRAVENOUS

## 2013-03-01 NOTE — Progress Notes (Signed)
RECEIVED A FAX FROM [facility] CONCERNING A PRIOR AUTHORIZATION FOR CLINDAMYCIN 1% GEL. THIS REQUEST WAS PLACED IN THE MANAGED CARE BIN.

## 2013-03-04 ENCOUNTER — Telehealth: Payer: Self-pay | Admitting: *Deleted

## 2013-03-04 ENCOUNTER — Encounter: Payer: Self-pay | Admitting: Oncology

## 2013-03-04 NOTE — Progress Notes (Signed)
[facility] approved clindamycin from 03/04/13-03/04/14 auth # [id number] P.

## 2013-03-04 NOTE — Telephone Encounter (Signed)
Informed pt the Clindamycin gel is approved now and to contact his drug store for refill. He verbalized understanding.

## 2013-03-04 NOTE — Telephone Encounter (Signed)
Prior authorization request for clindamycin cream received from [facility] on [address]  Request to Managed Care for review.  Patient receives Erbitux chenotherapy and has the Erbitux rash.

## 2013-03-04 NOTE — Progress Notes (Signed)
Called [facility] for clindamycin 1% gel pa; per rep patient has to try and fail the clindamycin solution, differin (gel or cream), arythromycin (gel or solution), retin-A micro gel, or tretinoin (gel or cream).

## 2013-03-11 ENCOUNTER — Ambulatory Visit (HOSPITAL_BASED_OUTPATIENT_CLINIC_OR_DEPARTMENT_OTHER): Payer: Medicaid Other | Admitting: Oncology

## 2013-03-11 ENCOUNTER — Telehealth: Payer: Self-pay | Admitting: *Deleted

## 2013-03-11 ENCOUNTER — Other Ambulatory Visit (HOSPITAL_BASED_OUTPATIENT_CLINIC_OR_DEPARTMENT_OTHER): Payer: Medicaid Other | Admitting: Lab

## 2013-03-11 ENCOUNTER — Telehealth: Payer: Self-pay | Admitting: Oncology

## 2013-03-11 ENCOUNTER — Ambulatory Visit (HOSPITAL_BASED_OUTPATIENT_CLINIC_OR_DEPARTMENT_OTHER): Payer: Medicaid Other

## 2013-03-11 ENCOUNTER — Other Ambulatory Visit: Payer: Self-pay | Admitting: Lab

## 2013-03-11 VITALS — BP 134/73 | HR 81 | Temp 98.1°F | Resp 18 | Ht 72.0 in | Wt 149.2 lb

## 2013-03-11 DIAGNOSIS — Z5112 Encounter for antineoplastic immunotherapy: Secondary | ICD-10-CM

## 2013-03-11 DIAGNOSIS — Z5111 Encounter for antineoplastic chemotherapy: Secondary | ICD-10-CM

## 2013-03-11 DIAGNOSIS — C7951 Secondary malignant neoplasm of bone: Secondary | ICD-10-CM

## 2013-03-11 DIAGNOSIS — C029 Malignant neoplasm of tongue, unspecified: Secondary | ICD-10-CM

## 2013-03-11 DIAGNOSIS — C7800 Secondary malignant neoplasm of unspecified lung: Secondary | ICD-10-CM

## 2013-03-11 DIAGNOSIS — D6481 Anemia due to antineoplastic chemotherapy: Secondary | ICD-10-CM

## 2013-03-11 DIAGNOSIS — C7952 Secondary malignant neoplasm of bone marrow: Secondary | ICD-10-CM

## 2013-03-11 LAB — BASIC METABOLIC PANEL (CC13)
BUN: 8.1 mg/dL (ref 7.0–26.0)
Calcium: 9.4 mg/dL (ref 8.4–10.4)
Glucose: 92 mg/dl (ref 70–140)
Potassium: 3.7 mEq/L (ref 3.5–5.1)
Sodium: 140 mEq/L (ref 136–145)

## 2013-03-11 LAB — CBC WITH DIFFERENTIAL/PLATELET
BASO%: 0.4 % (ref 0.0–2.0)
EOS%: 3 % (ref 0.0–7.0)
MCH: 32.1 pg (ref 27.2–33.4)
MCHC: 32.3 g/dL (ref 32.0–36.0)
MCV: 99.3 fL — ABNORMAL HIGH (ref 79.3–98.0)
MONO%: 8.6 % (ref 0.0–14.0)
RDW: 14.2 % (ref 11.0–14.6)
lymph#: 0.9 10*3/uL (ref 0.9–3.3)

## 2013-03-11 MED ORDER — ONDANSETRON 16 MG/50ML IVPB (CHCC)
16.0000 mg | Freq: Once | INTRAVENOUS | Status: AC
Start: 2013-03-11 — End: 2013-03-11
  Administered 2013-03-11: 16 mg via INTRAVENOUS

## 2013-03-11 MED ORDER — DIPHENHYDRAMINE HCL 50 MG/ML IJ SOLN
50.0000 mg | Freq: Once | INTRAMUSCULAR | Status: AC
  Administered 2013-03-11: 50 mg via INTRAVENOUS

## 2013-03-11 MED ORDER — CARBOPLATIN CHEMO INTRADERMAL TEST DOSE 100MCG/0.02ML
100.0000 ug | Freq: Once | INTRADERMAL | Status: AC
  Administered 2013-03-11: 100 ug via INTRADERMAL
  Filled 2013-03-11: qty 0.01

## 2013-03-11 MED ORDER — HEPARIN SOD (PORK) LOCK FLUSH 100 UNIT/ML IV SOLN
500.0000 [IU] | Freq: Once | INTRAVENOUS | Status: AC | PRN
  Administered 2013-03-11: 500 [IU]
  Filled 2013-03-11: qty 5

## 2013-03-11 MED ORDER — SODIUM CHLORIDE 0.9 % IV SOLN: Freq: Once | INTRAVENOUS | Status: DC

## 2013-03-11 MED ORDER — DEXAMETHASONE SODIUM PHOSPHATE 20 MG/5ML IJ SOLN
20.0000 mg | Freq: Once | INTRAMUSCULAR | Status: AC
  Administered 2013-03-11: 20 mg via INTRAVENOUS

## 2013-03-11 MED ORDER — SODIUM CHLORIDE 0.9 % IV SOLN
180.0000 mg | Freq: Once | INTRAVENOUS | Status: AC
  Administered 2013-03-11: 180 mg via INTRAVENOUS
  Filled 2013-03-11: qty 18

## 2013-03-11 MED ORDER — CETUXIMAB CHEMO IV INJECTION 200 MG/100ML
166.6667 mg/m2 | Freq: Once | INTRAVENOUS | Status: AC
  Administered 2013-03-11: 300 mg via INTRAVENOUS
  Filled 2013-03-11: qty 150

## 2013-03-11 MED ORDER — PACLITAXEL CHEMO INJECTION 300 MG/50ML
48.0000 mg/m2 | Freq: Once | INTRAVENOUS | Status: AC
  Administered 2013-03-11: 90 mg via INTRAVENOUS
  Filled 2013-03-11: qty 15

## 2013-03-11 MED ORDER — FAMOTIDINE IN NACL 20-0.9 MG/50ML-% IV SOLN
20.0000 mg | Freq: Once | INTRAVENOUS | Status: AC
  Administered 2013-03-11: 20 mg via INTRAVENOUS

## 2013-03-11 MED ORDER — PROCHLORPERAZINE MALEATE 10 MG PO TABS: 10.0000 mg | ORAL_TABLET | Freq: Four times a day (QID) | ORAL | Status: DC | PRN

## 2013-03-11 MED ORDER — SODIUM CHLORIDE 0.9 % IJ SOLN
10.0000 mL | INTRAMUSCULAR | Status: DC | PRN
  Administered 2013-03-11: 10 mL
  Filled 2013-03-11: qty 10

## 2013-03-11 NOTE — Telephone Encounter (Signed)
, °

## 2013-03-11 NOTE — Telephone Encounter (Signed)
Per staff message and POF I have scheduled appts.  JMW  

## 2013-03-11 NOTE — Patient Instructions (Addendum)
[facility] Discharge Instructions for Patients Receiving Chemotherapy  Today you received the following chemotherapy agents: Carboplatin, Taxol, Erbitux.  To help prevent nausea and vomiting after your treatment, we encourage you to take your nausea medication.   If you develop nausea and vomiting that is not controlled by your nausea medication, call the clinic.   BELOW ARE SYMPTOMS THAT SHOULD BE REPORTED IMMEDIATELY:  *FEVER GREATER THAN 100.5 F  *CHILLS WITH OR WITHOUT FEVER  NAUSEA AND VOMITING THAT IS NOT CONTROLLED WITH YOUR NAUSEA MEDICATION  *UNUSUAL SHORTNESS OF BREATH  *UNUSUAL BRUISING OR BLEEDING  TENDERNESS IN MOUTH AND THROAT WITH OR WITHOUT PRESENCE OF ULCERS  *URINARY PROBLEMS  *BOWEL PROBLEMS  UNUSUAL RASH Items with * indicate a potential emergency and should be followed up as soon as possible.  Feel free to call the clinic you have any questions or concerns. The clinic phone number is [phone].

## 2013-03-11 NOTE — Progress Notes (Signed)
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 He now developed biopsy proven metastatic disease in 03/2012 with met in right neck, left lung mass and left illac.   PAST THERAPY:  1. 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 He developed recurrence in the left submandibular gland in August 2012. He underwent on 07/27/2011 radical left neck dissection by Dr. Ezzard Standing and Dr. Geryl Rankins. Pathology case number [id number] showed total 15 lymph nodes from left radical neck dissection that was all negative. However the left submandibular gland mass showed a 3.5 cm invasive SCC, moderately differentiates involving the adjacent skeletal muscle tissue and bone a tissue and extending into the inked margin. There was no evidence of any lymphatic invasion or perineural invasion. He underwent further resection by Dr. Mary Sella at [facility] on 09/29/2011.  2. He received adjuvant chemoradiation therapy with q 3 week Cisplatin given 11/15/11-12/05/11 (received 2 of 3 doses; 3rd dose held due to febrile neutropenia and development of flap abscess). He received concurrent XRT 11/15/11 through 01/05/12.  3. For right neck recurrence in 03/2012; he underwent palliative right neck dissection by Dr. Manson Passey at [facility].   CURRENT THERAPY: started on 05/15/2012 palliative chemo weekly Carboplatin/Taxol/Erbitux; 3 weeks on; 1 week off.  Due to prolonged cytopenia, his chemo has been changed to weekly; 2 weeks on, 1 week off.   INTERVAL HISTORY: NORWIN ALEMAN 52 y.o. male returns for regular follow up with his father. With resumption of chemo/Erbitux, he has been having more skin rash on the scalp, face, chest and upper back.  The rash does not itch much  except on the scalp.  He uses Hydrocortisone cream.  He has mild fatigue; however, he is still independent of all activities of daily living.  His left jaw pain is now minimal.  He still depends on the PEG tube for the majority of his nutrition.    Patient denies fever, headache, visual changes, confusion, drenching night sweats, palpable lymph node swelling, mucositis, odynophagia, dysphagia, nausea vomiting, jaundice, chest pain, palpitation, shortness of breath, dyspnea on exertion, productive cough, gum bleeding, epistaxis, hematemesis, hemoptysis, abdominal pain, abdominal swelling, early satiety, melena, hematochezia, hematuria, spontaneous bleeding, joint swelling, joint pain, heat or cold intolerance, bowel bladder incontinence, back pain, focal motor weakness.    Past Medical History  Diagnosis Date  . Hemorrhoids   . Hypertension     no medications at present  . GERD (gastroesophageal reflux disease)     no medication  . Depression     treated approx 12 years ago, no treatment at this time  . Hiatal hernia   . Hx of radiation therapy 11/15/11 -01/05/12    left lateral tongue  . Lesion of left lung 03/14/12    CT chest, [facility], squamous cell carcinoma  . Facial rash 05/24/2012  . History of radiation therapy 07/24/12-07/26/12    left iliac bone, 18Gy/3 sessions  . Tongue cancer 12/2010    left side of tongue T1 lesion excised 4/12  . Lung metastasis dx'd 03/2012  . Bone metastasis 04/25/12    PET scan-left iliac bone    Past Surgical History  Procedure Laterality Date  . Inguinal herniorrhapy    . Tonsillectomy and adenoidectomy    .  Excision of tongue mass      April 2012  . Hernia repair    . Neck mass excision  07/27/2011  . Radical neck dissection  07/27/2011    Procedure: RADICAL NECK DISSECTION;  Surgeon: Drema Halon, MD;  Location: [facility] OR;  Service: ENT;  Laterality: Left;  Marland Kitchen Mandibulectomy  09/2011    and scapular free flap  . Modified radical neck dissection   03/27/12    excision of lip lesion, lip benign, right neck squamous cell ca    Current Outpatient Prescriptions  Medication Sig Dispense Refill  . clindamycin (CLINDAGEL) 1 % gel Apply topically as needed. Apply to facial and chest skin rash.  30 g  3  . clobetasol cream (TEMOVATE) 0.05 % Apply topically as needed. Apply to rash BID      . fentaNYL (DURAGESIC - DOSED MCG/HR) 25 MCG/HR Place 1 patch (25 mcg total) onto the skin every 3 (three) days.  10 patch  0  . gabapentin (NEURONTIN) 300 MG capsule Take 1 capsule (300 mg total) by mouth 3 (three) times daily.  90 capsule  3  . HYDROcodone-acetaminophen (NORCO/VICODIN) 5-325 MG per tablet TAKE 1 TABLET BY MOUTH EVERY 6 HOURS AS NEEDED FOR PAIN  90 tablet  0  . hydrocortisone cream 0.5 % Apply topically 2 (two) times daily as needed. Apply to axilla rash twice daily as needed.      . lidocaine-prilocaine (EMLA) cream Apply topically as needed. Apply to [facility] site one hour prior to needle stick to numb skin.  30 g  3  . LORazepam (ATIVAN) 0.5 MG tablet Place 1 tablet (0.5 mg total) under the tongue every 6 (six) hours as needed for anxiety (nausea/vomiting).  60 tablet  5  . Nutritional Supplements (FEEDING SUPPLEMENT, OSMOLITE 1.5 CAL,) LIQD Begin Osmolite 1.5 at 60 ml/hr for 12 hours nocturnal continuous feeding daily. Flush with 60 ml free water before and after continuous feedings. Continue 1 can of Osmolite 1.5 via gravity feeding TID with 60 ml free water before and after each bolus feeding. Please send pump to patient.  1422 mL  0  . omeprazole (PRILOSEC) 10 MG capsule Take 10 mg by mouth daily.      . ondansetron (ZOFRAN) 8 MG tablet Take 8 mg by mouth every 12 (twelve) hours as needed.      . prochlorperazine (COMPAZINE) 10 MG tablet Take 1 tablet (10 mg total) by mouth every 6 (six) hours as needed.  60 tablet  1  . sertraline (ZOLOFT) 50 MG tablet Take 75 mg by mouth daily.       . sodium fluoride (PREVIDENT 5000 PLUS) 1.1 % CREA  dental cream Apply thin ribbon of cream to tooth brush. Brush teeth for 2 minutes. Spit out excess-DO NOT swallow. DO NOT rinse afterwards. Repeat nightly.  1 Tube  prn   No current facility-administered medications for this visit.   Facility-Administered Medications Ordered in Other Visits  Medication Dose Route Frequency Provider Last Rate Last Dose  . 0.9 %  sodium chloride infusion   Intravenous Once Exie Parody, MD      . CARBOplatin (PARAPLATIN) 180 mg in sodium chloride 0.9 % 100 mL chemo infusion  180 mg Intravenous Once Exie Parody, MD      . cetuximab (ERBITUX) chemo infusion 300 mg  132.4401 mg/m2 (Treatment Plan Actual) Intravenous Once Exie Parody, MD 150 mL/hr at 03/11/13 1428 300 mg at 03/11/13 1428  . heparin  lock flush 100 unit/mL  500 Units Intracatheter Once PRN Exie Parody, MD      . PACLitaxel (TAXOL) 90 mg in sodium chloride 0.9 % 250 mL chemo infusion (</= 80mg /m2)  48 mg/m2 (Treatment Plan Actual) Intravenous Once Exie Parody, MD      . sodium chloride 0.9 % injection 10 mL  10 mL Intracatheter PRN Exie Parody, MD        ALLERGIES:  is allergic to codeine phosphate; morphine and related; and tape.  Filed Vitals:   03/11/13 1107  BP: 134/73  Pulse: 81  Temp: 98.1 F (36.7 C)  Resp: 18   Wt Readings from Last 3 Encounters:  03/11/13 149 lb 3.2 oz (67.677 kg)  02/18/13 150 lb 12.8 oz (68.402 kg)  01/25/13 147 lb 6.4 oz (66.86 kg)   ECOG Performance status: 1-2  PHYSICAL EXAMINATION:   General: thin-appearing man, in no acute distress. Eyes: no scleral icterus. ENT: There were no oropharyngeal lesions on my unaided exam except for post op changes.   Neck was without thyromegaly. There was a flap in the left jaw which has healed. Right neck was fibrotic from resection. There was no erythema, purulent discharge, pain on palpation. Lymphatics: no clear palpable adenopathy. Respiratory: lungs were clear bilaterally without wheezing or crackles. Cardiovascular: Regular rate and  rhythm, S1/S2, without murmur, rub or gallop. There was no pedal edema. GI: abdomen was soft, flat, nontender, nondistended, without organomegaly. PEG tube was dry, clean, intact. Muscoloskeletal: no spinal tenderness of palpation of vertebral spine. Skin exam was without echymosis, petichae. There was moderate aciniform skin rash on the chest and back, scalp and minimally on the face.  Neuro exam was nonfocal. Patient was able to get on and off exam table without assistance. Gait was normal. Patient was alert and oriented. Attention was good. Language was appropriate. Mood was normal without depression. Speech was not pressured. Thought content was not tangential.    LABORATORY/RADIOLOGY DATA:  Lab Results  Component Value Date   WBC 4.6 03/11/2013   HGB 9.8* 03/11/2013   HCT 30.3* 03/11/2013   PLT 209 03/11/2013   GLUCOSE 92 03/11/2013   CHOL 160 02/14/2008   TRIG 442* 02/14/2008   HDL 26.5* 02/14/2008   LDLDIRECT 64.8 02/14/2008   ALKPHOS 52 02/25/2013   ALT 15 02/25/2013   AST 19 02/25/2013   NA 140 03/11/2013   K 3.7 03/11/2013   CL 102 02/25/2013   CREATININE 0.6* 03/11/2013   BUN 8.1 03/11/2013   CO2 30* 03/11/2013   INR 0.99 08/20/2012    RADIOLOGY:   I personally reviewed the following CT scan.   CT CHEST  Findings: Fibrotic changes within the lower right neck appear  grossly stable. Left IJ Port-A-Cath tip is at the SVC right atrial  junction. There are no enlarged mediastinal or hilar lymph nodes.  A right axillary lymph node has slightly enlarged, measuring 8 mm  short axis on image 24.  There is no pleural or pericardial effusion. There has been  interval enlargement of the irregular cavitary lesion within the  superior segment of the left lower lobe, now measuring 3.5 x 2.6 cm  on image 26. The previously demonstrated nodularity along the  superior aspect of the left major fissure has improved. A tiny  right lower lobe nodule on image 45 is stable. New patchy left  lower lobe density  on image 58 may represent atelectasis. No other  enlarging suspicious nodules are identified.  There are no worrisome osseous findings.   IMPRESSION:  1. Enlargement of dominant cavitary left lower lobe lesion. This  could reflect metastatic squamous cell carcinoma or by a primary  lung malignancy.  2. No other evidence of thoracic metastatic disease.   CT ABDOMEN AND PELVIS  Findings: The multilobulated partially calcified cystic lesion  within the spleen is stable, measuring 6 mm in diameter. The  spleen otherwise appears unremarkable. There are probable  noncalcified gallstones within the gallbladder lumen. There is no  gallbladder wall thickening or biliary dilatation. The liver,  pancreas and adrenal glands appear unremarkable.  Simple renal cysts bilaterally are unchanged. There is no  hydronephrosis.  Percutaneous G tube remains in place. The stomach is decompressed.  No small or large bowel abnormalities are identified.  Aorto iliac atherosclerosis appears stable. There are no enlarged  abdominal pelvic lymph nodes. There is no ascites or peritoneal  nodularity. The bladder, prostate gland and seminal vesicles  appear unremarkable.  No worrisome osseous lesions are identified. The known lytic  lesion involving the left iliac bone remains poorly visualized by  CT.   IMPRESSION:  1. Stable abdominal pelvic CT without evidence of progressive  metastatic disease.  2. The known lesion in the left iliac bone remains poorly  visualized.  3. Stable multi septated and partially calcified splenic lesion.    ASSESSMENT AND PLAN:   1. Smoking: none at this time.   2. Recurrent left lateral tongue squamous cell carcinoma: He now has metastatic disease to the lung and bone.  This last CT scan today showed partial disease response in the left lung mass and iliac bone.  -His latest restaging CT showed slight enlargement of the left cavitary lesion.  There was no convincing evidence  of disease progression anywhere else.  - He was evaluated at [facility] to see if he is a candidate for any clinical trial.  However, it won't be another 2 months before they can find out.  Recommendations from Dr. Wendie Simmer was to continue with current chemo until there is rapid progression of disease.   3.  Jaw bone pain due to neuralgia: he is on Neurontin with response.   4.  Bone met: limited.  S/p palliative XRT. He has been on Zometa every 6 weeks.   4. Anemia: This is from chemo.  There is no active bleeding. No transfusion indicated.   5. Mild calorie/Protein malnutrition: stable weight.   6. Skin rash: Due to Erbitux.  He is on Cleocin cream and Hydrocortisone cream prn.  If skin rash significantly worsens, we may consider temporary holiday from Erbitux in the future and just continue with Carboplatin/Taxol.   7.   Follow up: in about 3 weeks.      I informed Mr. Allman and his father that I am leaving the practice.  [facility] will [facility] for him to see another provider when he returns.    The length of time of the face-to-face encounter was 25 minutes. More than 50% of time was spent counseling and coordination of care.        Taiden Raybourn T. Gaylyn Rong, M.D.

## 2013-03-12 ENCOUNTER — Encounter: Payer: Self-pay | Admitting: Oncology

## 2013-03-18 ENCOUNTER — Other Ambulatory Visit (HOSPITAL_BASED_OUTPATIENT_CLINIC_OR_DEPARTMENT_OTHER): Payer: Medicaid Other

## 2013-03-18 ENCOUNTER — Ambulatory Visit (HOSPITAL_BASED_OUTPATIENT_CLINIC_OR_DEPARTMENT_OTHER): Payer: Medicaid Other

## 2013-03-18 ENCOUNTER — Other Ambulatory Visit: Payer: Self-pay | Admitting: Lab

## 2013-03-18 VITALS — BP 125/73 | HR 85 | Temp 98.1°F

## 2013-03-18 DIAGNOSIS — C7951 Secondary malignant neoplasm of bone: Secondary | ICD-10-CM

## 2013-03-18 DIAGNOSIS — Z5111 Encounter for antineoplastic chemotherapy: Secondary | ICD-10-CM

## 2013-03-18 DIAGNOSIS — C7800 Secondary malignant neoplasm of unspecified lung: Secondary | ICD-10-CM

## 2013-03-18 DIAGNOSIS — C029 Malignant neoplasm of tongue, unspecified: Secondary | ICD-10-CM

## 2013-03-18 DIAGNOSIS — Z5112 Encounter for antineoplastic immunotherapy: Secondary | ICD-10-CM

## 2013-03-18 LAB — BASIC METABOLIC PANEL (CC13)
CO2: 32 mEq/L — ABNORMAL HIGH (ref 22–29)
Chloride: 101 mEq/L (ref 98–109)
Creatinine: 0.6 mg/dL — ABNORMAL LOW (ref 0.7–1.3)
Potassium: 3.7 mEq/L (ref 3.5–5.1)

## 2013-03-18 LAB — CBC WITH DIFFERENTIAL/PLATELET
BASO%: 0.8 % (ref 0.0–2.0)
Eosinophils Absolute: 0.3 10*3/uL (ref 0.0–0.5)
HCT: 30.2 % — ABNORMAL LOW (ref 38.4–49.9)
LYMPH%: 16 % (ref 14.0–49.0)
MCHC: 31.8 g/dL — ABNORMAL LOW (ref 32.0–36.0)
MONO#: 0.4 10*3/uL (ref 0.1–0.9)
NEUT#: 3.4 10*3/uL (ref 1.5–6.5)
Platelets: 219 10*3/uL (ref 140–400)
RBC: 3.03 10*6/uL — ABNORMAL LOW (ref 4.20–5.82)
WBC: 4.9 10*3/uL (ref 4.0–10.3)
lymph#: 0.8 10*3/uL — ABNORMAL LOW (ref 0.9–3.3)
nRBC: 0 % (ref 0–0)

## 2013-03-18 MED ORDER — SODIUM CHLORIDE 0.9 % IV SOLN
180.0000 mg | Freq: Once | INTRAVENOUS | Status: AC
  Administered 2013-03-18: 180 mg via INTRAVENOUS
  Filled 2013-03-18: qty 18

## 2013-03-18 MED ORDER — SODIUM CHLORIDE 0.9 % IV SOLN
48.0000 mg/m2 | Freq: Once | INTRAVENOUS | Status: AC
  Administered 2013-03-18: 90 mg via INTRAVENOUS
  Filled 2013-03-18: qty 15

## 2013-03-18 MED ORDER — FAMOTIDINE IN NACL 20-0.9 MG/50ML-% IV SOLN
20.0000 mg | Freq: Once | INTRAVENOUS | Status: AC
  Administered 2013-03-18: 20 mg via INTRAVENOUS

## 2013-03-18 MED ORDER — SODIUM CHLORIDE 0.9 % IJ SOLN
10.0000 mL | INTRAMUSCULAR | Status: DC | PRN
  Administered 2013-03-18: 10 mL
  Filled 2013-03-18: qty 10

## 2013-03-18 MED ORDER — CARBOPLATIN CHEMO INTRADERMAL TEST DOSE 100MCG/0.02ML
100.0000 ug | Freq: Once | INTRADERMAL | Status: AC
  Administered 2013-03-18: 100 ug via INTRADERMAL
  Filled 2013-03-18: qty 0.01

## 2013-03-18 MED ORDER — SODIUM CHLORIDE 0.9 % IV SOLN
Freq: Once | INTRAVENOUS | Status: AC
  Administered 2013-03-18: 11:00:00 via INTRAVENOUS

## 2013-03-18 MED ORDER — CETUXIMAB CHEMO IV INJECTION 200 MG/100ML
166.6667 mg/m2 | Freq: Once | INTRAVENOUS | Status: AC
  Administered 2013-03-18: 300 mg via INTRAVENOUS
  Filled 2013-03-18: qty 150

## 2013-03-18 MED ORDER — DEXAMETHASONE SODIUM PHOSPHATE 20 MG/5ML IJ SOLN
20.0000 mg | Freq: Once | INTRAMUSCULAR | Status: AC
  Administered 2013-03-18: 20 mg via INTRAVENOUS

## 2013-03-18 MED ORDER — HEPARIN SOD (PORK) LOCK FLUSH 100 UNIT/ML IV SOLN
500.0000 [IU] | Freq: Once | INTRAVENOUS | Status: AC | PRN
  Administered 2013-03-18: 500 [IU]
  Filled 2013-03-18: qty 5

## 2013-03-18 MED ORDER — ONDANSETRON 16 MG/50ML IVPB (CHCC)
16.0000 mg | Freq: Once | INTRAVENOUS | Status: AC
  Administered 2013-03-18: 16 mg via INTRAVENOUS

## 2013-03-18 MED ORDER — DIPHENHYDRAMINE HCL 50 MG/ML IJ SOLN
50.0000 mg | Freq: Once | INTRAMUSCULAR | Status: AC
  Administered 2013-03-18: 50 mg via INTRAVENOUS

## 2013-03-18 NOTE — Patient Instructions (Addendum)
[facility] Discharge Instructions for Patients Receiving Chemotherapy  Today you received the following chemotherapy agents carboplatin, taxol, erbitux  To help prevent nausea and vomiting after your treatment, we encourage you to take your nausea medication as needed   If you develop nausea and vomiting that is not controlled by your nausea medication, call the clinic.   BELOW ARE SYMPTOMS THAT SHOULD BE REPORTED IMMEDIATELY:  *FEVER GREATER THAN 100.5 F  *CHILLS WITH OR WITHOUT FEVER  NAUSEA AND VOMITING THAT IS NOT CONTROLLED WITH YOUR NAUSEA MEDICATION  *UNUSUAL SHORTNESS OF BREATH  *UNUSUAL BRUISING OR BLEEDING  TENDERNESS IN MOUTH AND THROAT WITH OR WITHOUT PRESENCE OF ULCERS  *URINARY PROBLEMS  *BOWEL PROBLEMS  UNUSUAL RASH Items with * indicate a potential emergency and should be followed up as soon as possible.  Feel free to call the clinic you have any questions or concerns. The clinic phone number is [phone].

## 2013-03-18 NOTE — Progress Notes (Signed)
Carbo skin test dose--  Started at 11:00am 11:05--At - no s/s reaction 11:15--At - no s/s reaction 11:30--At - no s/s reaction

## 2013-03-19 ENCOUNTER — Other Ambulatory Visit: Payer: Self-pay | Admitting: Oncology

## 2013-03-20 ENCOUNTER — Other Ambulatory Visit: Payer: Self-pay | Admitting: Oncology

## 2013-03-20 ENCOUNTER — Telehealth: Payer: Self-pay | Admitting: *Deleted

## 2013-03-20 NOTE — Telephone Encounter (Signed)
Pt left VM requesting refill on hydrocodone.

## 2013-03-20 NOTE — Telephone Encounter (Signed)
Called in refill on hydrocodone to [facility] and left pt VM informing him.

## 2013-03-25 ENCOUNTER — Ambulatory Visit: Payer: Self-pay

## 2013-03-29 ENCOUNTER — Encounter: Payer: Self-pay | Admitting: Pharmacist

## 2013-04-01 ENCOUNTER — Ambulatory Visit (HOSPITAL_BASED_OUTPATIENT_CLINIC_OR_DEPARTMENT_OTHER): Payer: Medicaid Other

## 2013-04-01 ENCOUNTER — Other Ambulatory Visit (HOSPITAL_BASED_OUTPATIENT_CLINIC_OR_DEPARTMENT_OTHER): Payer: Medicaid Other | Admitting: Lab

## 2013-04-01 ENCOUNTER — Ambulatory Visit (HOSPITAL_BASED_OUTPATIENT_CLINIC_OR_DEPARTMENT_OTHER): Payer: Medicaid Other | Admitting: Oncology

## 2013-04-01 ENCOUNTER — Encounter: Payer: Self-pay | Admitting: Oncology

## 2013-04-01 VITALS — BP 127/76 | HR 79 | Temp 97.9°F | Resp 18 | Ht 72.0 in | Wt 149.1 lb

## 2013-04-01 DIAGNOSIS — C029 Malignant neoplasm of tongue, unspecified: Secondary | ICD-10-CM

## 2013-04-01 DIAGNOSIS — C7951 Secondary malignant neoplasm of bone: Secondary | ICD-10-CM

## 2013-04-01 DIAGNOSIS — T451X5A Adverse effect of antineoplastic and immunosuppressive drugs, initial encounter: Secondary | ICD-10-CM

## 2013-04-01 DIAGNOSIS — Z5112 Encounter for antineoplastic immunotherapy: Secondary | ICD-10-CM

## 2013-04-01 DIAGNOSIS — L270 Generalized skin eruption due to drugs and medicaments taken internally: Secondary | ICD-10-CM

## 2013-04-01 DIAGNOSIS — R6884 Jaw pain: Secondary | ICD-10-CM

## 2013-04-01 DIAGNOSIS — C7800 Secondary malignant neoplasm of unspecified lung: Secondary | ICD-10-CM

## 2013-04-01 DIAGNOSIS — C7952 Secondary malignant neoplasm of bone marrow: Secondary | ICD-10-CM

## 2013-04-01 DIAGNOSIS — C770 Secondary and unspecified malignant neoplasm of lymph nodes of head, face and neck: Secondary | ICD-10-CM

## 2013-04-01 DIAGNOSIS — G521 Disorders of glossopharyngeal nerve: Secondary | ICD-10-CM

## 2013-04-01 DIAGNOSIS — Z5111 Encounter for antineoplastic chemotherapy: Secondary | ICD-10-CM

## 2013-04-01 DIAGNOSIS — E441 Mild protein-calorie malnutrition: Secondary | ICD-10-CM

## 2013-04-01 LAB — CBC WITH DIFFERENTIAL/PLATELET
Basophils Absolute: 0 10*3/uL (ref 0.0–0.1)
EOS%: 1.7 % (ref 0.0–7.0)
Eosinophils Absolute: 0.1 10*3/uL (ref 0.0–0.5)
HCT: 32.6 % — ABNORMAL LOW (ref 38.4–49.9)
HGB: 10.5 g/dL — ABNORMAL LOW (ref 13.0–17.1)
LYMPH%: 17.1 % (ref 14.0–49.0)
MCH: 31.8 pg (ref 27.2–33.4)
MCV: 98.8 fL — ABNORMAL HIGH (ref 79.3–98.0)
MONO%: 8.4 % (ref 0.0–14.0)
NEUT#: 3.4 10*3/uL (ref 1.5–6.5)
NEUT%: 72.6 % (ref 39.0–75.0)
Platelets: 218 10*3/uL (ref 140–400)
RDW: 14.6 % (ref 11.0–14.6)

## 2013-04-01 LAB — BASIC METABOLIC PANEL (CC13)
BUN: 8.7 mg/dL (ref 7.0–26.0)
CO2: 27 mEq/L (ref 22–29)
Calcium: 9.5 mg/dL (ref 8.4–10.4)
Glucose: 73 mg/dl (ref 70–140)
Sodium: 140 mEq/L (ref 136–145)

## 2013-04-01 MED ORDER — ONDANSETRON 16 MG/50ML IVPB (CHCC)
16.0000 mg | Freq: Once | INTRAVENOUS | Status: AC
  Administered 2013-04-01: 16 mg via INTRAVENOUS

## 2013-04-01 MED ORDER — SODIUM CHLORIDE 0.9 % IJ SOLN
10.0000 mL | INTRAMUSCULAR | Status: DC | PRN
  Administered 2013-04-01: 10 mL
  Filled 2013-04-01: qty 10

## 2013-04-01 MED ORDER — DIPHENHYDRAMINE HCL 50 MG/ML IJ SOLN
50.0000 mg | Freq: Once | INTRAMUSCULAR | Status: AC
  Administered 2013-04-01: 50 mg via INTRAVENOUS

## 2013-04-01 MED ORDER — DEXAMETHASONE SODIUM PHOSPHATE 20 MG/5ML IJ SOLN
20.0000 mg | Freq: Once | INTRAMUSCULAR | Status: AC
  Administered 2013-04-01: 20 mg via INTRAVENOUS

## 2013-04-01 MED ORDER — SODIUM CHLORIDE 0.9 % IV SOLN
50.0000 mg | Freq: Once | INTRAVENOUS | Status: AC
  Administered 2013-04-01: 50 mg via INTRAVENOUS
  Filled 2013-04-01: qty 2

## 2013-04-01 MED ORDER — SODIUM CHLORIDE 0.9 % IV SOLN
180.0000 mg | Freq: Once | INTRAVENOUS | Status: AC
  Administered 2013-04-01: 180 mg via INTRAVENOUS
  Filled 2013-04-01: qty 18

## 2013-04-01 MED ORDER — SODIUM CHLORIDE 0.9 % IV SOLN
Freq: Once | INTRAVENOUS | Status: AC
  Administered 2013-04-01: 11:00:00 via INTRAVENOUS

## 2013-04-01 MED ORDER — CARBOPLATIN CHEMO INTRADERMAL TEST DOSE 100MCG/0.02ML
100.0000 ug | Freq: Once | INTRADERMAL | Status: AC
  Administered 2013-04-01: 100 ug via INTRADERMAL
  Filled 2013-04-01: qty 0.01

## 2013-04-01 MED ORDER — DIPHENHYDRAMINE HCL 50 MG/ML IJ SOLN: 50.0000 mg | Freq: Once | INTRAMUSCULAR | Status: DC

## 2013-04-01 MED ORDER — HEPARIN SOD (PORK) LOCK FLUSH 100 UNIT/ML IV SOLN
500.0000 [IU] | Freq: Once | INTRAVENOUS | Status: AC | PRN
  Administered 2013-04-01: 500 [IU]
  Filled 2013-04-01: qty 5

## 2013-04-01 MED ORDER — CETUXIMAB CHEMO IV INJECTION 200 MG/100ML
166.6667 mg/m2 | Freq: Once | INTRAVENOUS | Status: AC
  Administered 2013-04-01: 300 mg via INTRAVENOUS
  Filled 2013-04-01: qty 150

## 2013-04-01 MED ORDER — FENTANYL 25 MCG/HR TD PT72: 1.0000 | MEDICATED_PATCH | TRANSDERMAL | Status: DC

## 2013-04-01 MED ORDER — FAMOTIDINE IN NACL 20-0.9 MG/50ML-% IV SOLN: 20.0000 mg | Freq: Once | INTRAVENOUS | Status: DC

## 2013-04-01 MED ORDER — SODIUM CHLORIDE 0.9 % IV SOLN
4.0000 mg | Freq: Once | INTRAVENOUS | Status: AC
  Administered 2013-04-01: 4 mg via INTRAVENOUS
  Filled 2013-04-01: qty 5

## 2013-04-01 MED ORDER — SODIUM CHLORIDE 0.9 % IV SOLN
48.0000 mg/m2 | Freq: Once | INTRAVENOUS | Status: AC
  Administered 2013-04-01: 90 mg via INTRAVENOUS
  Filled 2013-04-01: qty 15

## 2013-04-01 NOTE — Progress Notes (Signed)
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 He now developed biopsy proven metastatic disease in 03/2012 with met in right neck, left lung mass and left illac.   PAST THERAPY:  1. 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 He developed recurrence in the left submandibular gland in August 2012. He underwent on 07/27/2011 radical left neck dissection by Dr. Ezzard Standing and Dr. Geryl Rankins. Pathology case number [id number] showed total 15 lymph nodes from left radical neck dissection that was all negative. However the left submandibular gland mass showed a 3.5 cm invasive SCC, moderately differentiates involving the adjacent skeletal muscle tissue and bone a tissue and extending into the inked margin. There was no evidence of any lymphatic invasion or perineural invasion. He underwent further resection by Dr. Mary Sella at [facility] on 09/29/2011.  2. He received adjuvant chemoradiation therapy with q 3 week Cisplatin given 11/15/11-12/05/11 (received 2 of 3 doses; 3rd dose held due to febrile neutropenia and development of flap abscess). He received concurrent XRT 11/15/11 through 01/05/12.  3. For right neck recurrence in 03/2012; he underwent palliative right neck dissection by Dr. Manson Passey at [facility].   CURRENT THERAPY: started on 05/15/2012 palliative chemo weekly Carboplatin/Taxol/Erbitux; 3 weeks on; 1 week off.  Due to prolonged cytopenia, his chemo has been changed to weekly; 2 weeks on, 1 week off.   INTERVAL HISTORY: Thomas Bean 52 y.o. male returns for regular follow up with his father. With resumption of chemo/Erbitux, he has been having more skin rash on the scalp, face, chest and upper back.  The rash does not itch much  except on the scalp.  He uses Hydrocortisone cream.  He has mild fatigue; however, he is still independent of all activities of daily living.  His left jaw pain is now minimal.  He still depends on the PEG tube for the majority of his nutrition. States that he had more energy about 1 week ago and was more active. Now having left shoulder pain that is intermittent. Report that pain improves with position change. His hydrocodone works well for pain.   Patient denies fever, headache, visual changes, confusion, drenching night sweats, palpable lymph node swelling, mucositis, odynophagia, dysphagia, nausea vomiting, jaundice, chest pain, palpitation, shortness of breath, dyspnea on exertion, productive cough, gum bleeding, epistaxis, hematemesis, hemoptysis, abdominal pain, abdominal swelling, early satiety, melena, hematochezia, hematuria, spontaneous bleeding, joint swelling, joint pain, heat or cold intolerance, bowel bladder incontinence, back pain, focal motor weakness.    Past Medical History  Diagnosis Date  . Hemorrhoids   . Hypertension     no medications at present  . GERD (gastroesophageal reflux disease)     no medication  . Depression     treated approx 12 years ago, no treatment at this time  . Hiatal hernia   . Hx of radiation therapy 11/15/11 -01/05/12    left lateral tongue  . Lesion of left lung 03/14/12    CT chest, [facility], squamous cell carcinoma  . Facial rash 05/24/2012  . History of radiation therapy 07/24/12-07/26/12    left iliac bone, 18Gy/3 sessions  . Tongue cancer 12/2010    left side of tongue T1 lesion excised 4/12  . Lung metastasis dx'd 03/2012  . Bone  metastasis 04/25/12    PET scan-left iliac bone    Past Surgical History  Procedure Laterality Date  . Inguinal herniorrhapy    . Tonsillectomy and adenoidectomy    . Excision of tongue mass      April 2012  . Hernia repair    . Neck mass excision  07/27/2011  . Radical neck dissection  07/27/2011    Procedure:  RADICAL NECK DISSECTION;  Surgeon: Drema Halon, MD;  Location: [facility] OR;  Service: ENT;  Laterality: Left;  Marland Kitchen Mandibulectomy  09/2011    and scapular free flap  . Modified radical neck dissection  03/27/12    excision of lip lesion, lip benign, right neck squamous cell ca    Current Outpatient Prescriptions  Medication Sig Dispense Refill  . clindamycin (CLINDAGEL) 1 % gel Apply topically as needed. Apply to facial and chest skin rash.  30 g  3  . clobetasol cream (TEMOVATE) 0.05 % Apply topically as needed. Apply to rash BID      . fentaNYL (DURAGESIC - DOSED MCG/HR) 25 MCG/HR Place 1 patch (25 mcg total) onto the skin every 3 (three) days.  10 patch  0  . gabapentin (NEURONTIN) 300 MG capsule Take 1 capsule (300 mg total) by mouth 3 (three) times daily.  90 capsule  3  . HYDROcodone-acetaminophen (NORCO/VICODIN) 5-325 MG per tablet TAKE 1 TABLET BY MOUTH EVERY 6 HOURS AS NEEDED PAIN  90 tablet  0  . hydrocortisone cream 0.5 % Apply topically 2 (two) times daily as needed. Apply to axilla rash twice daily as needed.      . lidocaine-prilocaine (EMLA) cream Apply topically as needed. Apply to [facility] site one hour prior to needle stick to numb skin.  30 g  3  . LORazepam (ATIVAN) 0.5 MG tablet Place 1 tablet (0.5 mg total) under the tongue every 6 (six) hours as needed for anxiety (nausea/vomiting).  60 tablet  5  . Nutritional Supplements (FEEDING SUPPLEMENT, OSMOLITE 1.5 CAL,) LIQD Begin Osmolite 1.5 at 60 ml/hr for 12 hours nocturnal continuous feeding daily. Flush with 60 ml free water before and after continuous feedings. Continue 1 can of Osmolite 1.5 via gravity feeding TID with 60 ml free water before and after each bolus feeding. Please send pump to patient.  1422 mL  0  . omeprazole (PRILOSEC) 10 MG capsule Take 10 mg by mouth daily.      . ondansetron (ZOFRAN) 8 MG tablet Take 8 mg by mouth every 12 (twelve) hours as needed.      . prochlorperazine (COMPAZINE) 10 MG tablet Take  1 tablet (10 mg total) by mouth every 6 (six) hours as needed.  60 tablet  1  . sertraline (ZOLOFT) 50 MG tablet Take 75 mg by mouth daily.       . sodium fluoride (PREVIDENT 5000 PLUS) 1.1 % CREA dental cream Apply thin ribbon of cream to tooth brush. Brush teeth for 2 minutes. Spit out excess-DO NOT swallow. DO NOT rinse afterwards. Repeat nightly.  1 Tube  prn   No current facility-administered medications for this visit.   Facility-Administered Medications Ordered in Other Visits  Medication Dose Route Frequency Provider Last Rate Last Dose  . CARBOplatin (PARAPLATIN) 180 mg in sodium chloride 0.9 % 100 mL chemo infusion  180 mg Intravenous Once Exie Parody, MD      . cetuximab (ERBITUX) chemo infusion 300 mg  161.0960 mg/m2 (Treatment Plan Actual) Intravenous Once Exie Parody, MD      .  heparin lock flush 100 unit/mL  500 Units Intracatheter Once PRN Exie Parody, MD      . PACLitaxel (TAXOL) 90 mg in sodium chloride 0.9 % 250 mL chemo infusion (</= 80mg /m2)  48 mg/m2 (Treatment Plan Actual) Intravenous Once Exie Parody, MD      . sodium chloride 0.9 % injection 10 mL  10 mL Intracatheter PRN Exie Parody, MD        ALLERGIES:  is allergic to codeine phosphate; morphine and related; and tape.  Filed Vitals:   04/01/13 0913  BP: 127/76  Pulse: 79  Temp: 97.9 F (36.6 C)  Resp: 18   Wt Readings from Last 3 Encounters:  04/01/13 149 lb 1.6 oz (67.631 kg)  03/11/13 149 lb 3.2 oz (67.677 kg)  02/18/13 150 lb 12.8 oz (68.402 kg)   ECOG Performance status: 1-2  PHYSICAL EXAMINATION:   General: thin-appearing man, in no acute distress. Eyes: no scleral icterus. ENT: There were no oropharyngeal lesions on my unaided exam except for post op changes.   Neck was without thyromegaly. There was a flap in the left jaw which has healed. Right neck was fibrotic from resection. There was no erythema, purulent discharge, pain on palpation. Lymphatics: no clear palpable adenopathy. Respiratory: lungs were  clear bilaterally without wheezing or crackles. Cardiovascular: Regular rate and rhythm, S1/S2, without murmur, rub or gallop. There was no pedal edema. GI: abdomen was soft, flat, nontender, nondistended, without organomegaly. PEG tube was dry, clean, intact. Muscoloskeletal: no spinal tenderness of palpation of vertebral spine. Skin exam was without echymosis, petichae. There was moderate aciniform skin rash on the chest and back, scalp and minimally on the face.  Neuro exam was nonfocal. Patient was able to get on and off exam table without assistance. Gait was normal. Patient was alert and oriented. Attention was good. Language was appropriate. Mood was normal without depression. Speech was not pressured. Thought content was not tangential.    LABORATORY/RADIOLOGY DATA:  Lab Results  Component Value Date   WBC 4.7 04/01/2013   HGB 10.5* 04/01/2013   HCT 32.6* 04/01/2013   PLT 218 04/01/2013   GLUCOSE 73 04/01/2013   CHOL 160 02/14/2008   TRIG 442* 02/14/2008   HDL 26.5* 02/14/2008   LDLDIRECT 64.8 02/14/2008   ALKPHOS 52 02/25/2013   ALT 15 02/25/2013   AST 19 02/25/2013   NA 140 04/01/2013   K 3.7 04/01/2013   CL 102 02/25/2013   CREATININE 0.7 04/01/2013   BUN 8.7 04/01/2013   CO2 27 04/01/2013   INR 0.99 08/20/2012    ASSESSMENT AND PLAN:   1. Smoking: none at this time.   2. Recurrent left lateral tongue squamous cell carcinoma: He now has metastatic disease to the lung and bone.   -His latest restaging CT showed slight enlargement of the left cavitary lesion.  There was no convincing evidence of disease progression anywhere else.  - He was evaluated at [facility] to see if he is a candidate for any clinical trial.  However, it won't be another 2 months before they can find out.  Recommendations from Dr. Wendie Simmer was to continue with current chemo until there is rapid progression of disease.   3.  Jaw bone pain due to neuralgia: he is on Neurontin with response.   4.  Bone met: limited.  S/p  palliative XRT. He has been on Zometa every 6 weeks.   4. Anemia: This is from chemo.  There is no active  bleeding. No transfusion indicated.   5. Mild calorie/Protein malnutrition: stable weight.   6. Skin rash: Due to Erbitux.  He is on Cleocin cream and Hydrocortisone cream prn.  If skin rash significantly worsens, we may consider temporary holiday from Erbitux in the future and just continue with Carboplatin/Taxol.   7.   Follow up: in about 3 weeks.     The length of time of the face-to-face encounter was 30 minutes. More than 50% of time was spent counseling and coordination of care.

## 2013-04-01 NOTE — Progress Notes (Signed)
Carbo test dose negative at 5, 15 and 30 minute check

## 2013-04-01 NOTE — Patient Instructions (Addendum)
[facility] Discharge Instructions for Patients Receiving Chemotherapy  Today you received the following chemotherapy agents Erbitux, Carboplatin, Taxol, Zometa  To help prevent nausea and vomiting after your treatment, we encourage you to take your nausea medication as needed   If you develop nausea and vomiting that is not controlled by your nausea medication, call the clinic.   BELOW ARE SYMPTOMS THAT SHOULD BE REPORTED IMMEDIATELY:  *FEVER GREATER THAN 100.5 F  *CHILLS WITH OR WITHOUT FEVER  NAUSEA AND VOMITING THAT IS NOT CONTROLLED WITH YOUR NAUSEA MEDICATION  *UNUSUAL SHORTNESS OF BREATH  *UNUSUAL BRUISING OR BLEEDING  TENDERNESS IN MOUTH AND THROAT WITH OR WITHOUT PRESENCE OF ULCERS  *URINARY PROBLEMS  *BOWEL PROBLEMS  UNUSUAL RASH Items with * indicate a potential emergency and should be followed up as soon as possible.  Feel free to call the clinic you have any questions or concerns. The clinic phone number is [phone].

## 2013-04-02 ENCOUNTER — Encounter: Payer: Self-pay | Admitting: *Deleted

## 2013-04-02 ENCOUNTER — Encounter: Payer: Self-pay | Admitting: Oncology

## 2013-04-02 NOTE — Progress Notes (Unsigned)
[employer] approved fentanyl patches from 04/02/13-05/03/13.

## 2013-04-02 NOTE — Progress Notes (Signed)
RECEIVED A FAX FROM [facility] CONCERNING A PRIOR AUTHORIZATION FOR FENTANYL PATCH. THIS REQUEST WAS GIVEN TO MANAGED CARE.

## 2013-04-05 ENCOUNTER — Telehealth: Payer: Self-pay | Admitting: Oncology

## 2013-04-05 ENCOUNTER — Telehealth: Payer: Self-pay | Admitting: *Deleted

## 2013-04-05 ENCOUNTER — Encounter: Payer: Self-pay | Admitting: Oncology

## 2013-04-05 NOTE — Telephone Encounter (Signed)
Per staff message and POF I have scheduled appts.  JMW  

## 2013-04-05 NOTE — Telephone Encounter (Signed)
s.w. pt and advised on 7.28.14 appt and to pick up updated sched...pt ok and aware

## 2013-04-08 ENCOUNTER — Other Ambulatory Visit (HOSPITAL_BASED_OUTPATIENT_CLINIC_OR_DEPARTMENT_OTHER): Payer: Medicaid Other | Admitting: Lab

## 2013-04-08 ENCOUNTER — Ambulatory Visit (HOSPITAL_BASED_OUTPATIENT_CLINIC_OR_DEPARTMENT_OTHER): Payer: Medicaid Other

## 2013-04-08 VITALS — BP 128/72 | HR 82 | Temp 97.8°F

## 2013-04-08 DIAGNOSIS — C7951 Secondary malignant neoplasm of bone: Secondary | ICD-10-CM

## 2013-04-08 DIAGNOSIS — C029 Malignant neoplasm of tongue, unspecified: Secondary | ICD-10-CM

## 2013-04-08 DIAGNOSIS — Z5112 Encounter for antineoplastic immunotherapy: Secondary | ICD-10-CM

## 2013-04-08 DIAGNOSIS — C770 Secondary and unspecified malignant neoplasm of lymph nodes of head, face and neck: Secondary | ICD-10-CM

## 2013-04-08 DIAGNOSIS — C7800 Secondary malignant neoplasm of unspecified lung: Secondary | ICD-10-CM

## 2013-04-08 DIAGNOSIS — Z5111 Encounter for antineoplastic chemotherapy: Secondary | ICD-10-CM

## 2013-04-08 LAB — CBC WITH DIFFERENTIAL/PLATELET
BASO%: 0.4 % (ref 0.0–2.0)
EOS%: 4.1 % (ref 0.0–7.0)
HCT: 31.3 % — ABNORMAL LOW (ref 38.4–49.9)
LYMPH%: 19.3 % (ref 14.0–49.0)
MCH: 31.6 pg (ref 27.2–33.4)
MCHC: 31.9 g/dL — ABNORMAL LOW (ref 32.0–36.0)
MCV: 99.1 fL — ABNORMAL HIGH (ref 79.3–98.0)
MONO#: 0.3 10*3/uL (ref 0.1–0.9)
NEUT%: 68.8 % (ref 39.0–75.0)
Platelets: 213 10*3/uL (ref 140–400)

## 2013-04-08 LAB — BASIC METABOLIC PANEL (CC13)
CO2: 27 mEq/L (ref 22–29)
Chloride: 105 mEq/L (ref 98–109)
Glucose: 77 mg/dl (ref 70–140)
Potassium: 3.7 mEq/L (ref 3.5–5.1)
Sodium: 141 mEq/L (ref 136–145)

## 2013-04-08 MED ORDER — CARBOPLATIN CHEMO INTRADERMAL TEST DOSE 100MCG/0.02ML
100.0000 ug | Freq: Once | INTRADERMAL | Status: AC
  Administered 2013-04-08: 100 ug via INTRADERMAL
  Filled 2013-04-08: qty 0.01

## 2013-04-08 MED ORDER — FAMOTIDINE IN NACL 20-0.9 MG/50ML-% IV SOLN
20.0000 mg | Freq: Once | INTRAVENOUS | Status: AC
  Administered 2013-04-08: 20 mg via INTRAVENOUS

## 2013-04-08 MED ORDER — CARBOPLATIN CHEMO INJECTION 450 MG/45ML
180.0000 mg | Freq: Once | INTRAVENOUS | Status: AC
  Administered 2013-04-08: 180 mg via INTRAVENOUS
  Filled 2013-04-08: qty 18

## 2013-04-08 MED ORDER — DIPHENHYDRAMINE HCL 50 MG/ML IJ SOLN
50.0000 mg | Freq: Once | INTRAMUSCULAR | Status: AC
  Administered 2013-04-08: 50 mg via INTRAVENOUS

## 2013-04-08 MED ORDER — ONDANSETRON 16 MG/50ML IVPB (CHCC)
16.0000 mg | Freq: Once | INTRAVENOUS | Status: AC
  Administered 2013-04-08: 16 mg via INTRAVENOUS

## 2013-04-08 MED ORDER — SODIUM CHLORIDE 0.9 % IJ SOLN
10.0000 mL | INTRAMUSCULAR | Status: DC | PRN
  Administered 2013-04-08: 10 mL
  Filled 2013-04-08: qty 10

## 2013-04-08 MED ORDER — CETUXIMAB CHEMO IV INJECTION 200 MG/100ML
166.6667 mg/m2 | Freq: Once | INTRAVENOUS | Status: AC
  Administered 2013-04-08: 300 mg via INTRAVENOUS
  Filled 2013-04-08: qty 150

## 2013-04-08 MED ORDER — DEXAMETHASONE SODIUM PHOSPHATE 20 MG/5ML IJ SOLN
20.0000 mg | Freq: Once | INTRAMUSCULAR | Status: AC
  Administered 2013-04-08: 20 mg via INTRAVENOUS

## 2013-04-08 MED ORDER — PACLITAXEL CHEMO INJECTION 300 MG/50ML
48.0000 mg/m2 | Freq: Once | INTRAVENOUS | Status: AC
  Administered 2013-04-08: 90 mg via INTRAVENOUS
  Filled 2013-04-08: qty 15

## 2013-04-08 MED ORDER — SODIUM CHLORIDE 0.9 % IV SOLN
Freq: Once | INTRAVENOUS | Status: AC
  Administered 2013-04-08: 12:00:00 via INTRAVENOUS

## 2013-04-08 MED ORDER — HEPARIN SOD (PORK) LOCK FLUSH 100 UNIT/ML IV SOLN
500.0000 [IU] | Freq: Once | INTRAVENOUS | Status: AC | PRN
  Administered 2013-04-08: 500 [IU]
  Filled 2013-04-08: qty 5

## 2013-04-08 NOTE — Patient Instructions (Addendum)
[facility] Discharge Instructions for Patients Receiving Chemotherapy  Today you received the following chemotherapy agents: Erbitux, Taxol, and Carboplatin.  To help prevent nausea and vomiting after your treatment, we encourage you to take your nausea medication.   If you develop nausea and vomiting that is not controlled by your nausea medication, call the clinic.   BELOW ARE SYMPTOMS THAT SHOULD BE REPORTED IMMEDIATELY:  *FEVER GREATER THAN 100.5 F  *CHILLS WITH OR WITHOUT FEVER  NAUSEA AND VOMITING THAT IS NOT CONTROLLED WITH YOUR NAUSEA MEDICATION  *UNUSUAL SHORTNESS OF BREATH  *UNUSUAL BRUISING OR BLEEDING  TENDERNESS IN MOUTH AND THROAT WITH OR WITHOUT PRESENCE OF ULCERS  *URINARY PROBLEMS  *BOWEL PROBLEMS  UNUSUAL RASH Items with * indicate a potential emergency and should be followed up as soon as possible.  Feel free to call the clinic you have any questions or concerns. The clinic phone number is [phone].

## 2013-04-11 ENCOUNTER — Other Ambulatory Visit: Payer: Self-pay | Admitting: Oncology

## 2013-04-11 ENCOUNTER — Other Ambulatory Visit: Payer: Self-pay | Admitting: Internal Medicine

## 2013-04-11 DIAGNOSIS — R6884 Jaw pain: Secondary | ICD-10-CM

## 2013-04-11 DIAGNOSIS — C029 Malignant neoplasm of tongue, unspecified: Secondary | ICD-10-CM

## 2013-04-11 DIAGNOSIS — C801 Malignant (primary) neoplasm, unspecified: Secondary | ICD-10-CM

## 2013-04-11 DIAGNOSIS — C7951 Secondary malignant neoplasm of bone: Secondary | ICD-10-CM

## 2013-04-11 DIAGNOSIS — C770 Secondary and unspecified malignant neoplasm of lymph nodes of head, face and neck: Secondary | ICD-10-CM

## 2013-04-11 NOTE — Telephone Encounter (Signed)
Dr Cato Mulligan, 5 refills were given on 01/02/13. Per pharmacy its a 15 day supply and pt has been getting refilled every 15 days.  Is this ok or do you want to increase qty?

## 2013-04-12 ENCOUNTER — Telehealth: Payer: Self-pay | Admitting: *Deleted

## 2013-04-12 DIAGNOSIS — C029 Malignant neoplasm of tongue, unspecified: Secondary | ICD-10-CM

## 2013-04-12 DIAGNOSIS — C770 Secondary and unspecified malignant neoplasm of lymph nodes of head, face and neck: Secondary | ICD-10-CM

## 2013-04-12 DIAGNOSIS — F419 Anxiety disorder, unspecified: Secondary | ICD-10-CM

## 2013-04-12 MED ORDER — LORAZEPAM 0.5 MG PO TABS: 0.5000 mg | ORAL_TABLET | Freq: Four times a day (QID) | ORAL | Status: DC | PRN

## 2013-04-12 NOTE — Telephone Encounter (Signed)
I am not sure who is prescribing this for him. Looks like PCP gave him 60 tabs with 5 refills on 01/02/13. Is he taking this for anxiety or for nausea? According to Rx it is for anxiety and I prefer to defer to PCP if he is receiving for anxiety.

## 2013-04-12 NOTE — Telephone Encounter (Signed)
Pt requesting refill on lorazepam

## 2013-04-12 NOTE — Telephone Encounter (Signed)
Pt states Dr. Gaylyn Rong initially prescribed the ativan for nausea and Dr. Cato Mulligan did refill it for him back in April but the pharmacy would only give him 2 refills.  He thought Dr. Gaylyn Rong was going to take back over management of Ativan and his Sertraline?  Kristin ordered refill for #30 and then to discuss w/ Dr. Alecia Lemming on next office visit on 8/11.   Called Ativan into [facility] and notified pt.Marland Kitchen He verbalized understanding.

## 2013-04-19 NOTE — Telephone Encounter (Signed)
Ok to increase to 30 day supply/ 5 refills

## 2013-04-22 ENCOUNTER — Other Ambulatory Visit: Payer: Self-pay | Admitting: *Deleted

## 2013-04-22 ENCOUNTER — Other Ambulatory Visit: Payer: Self-pay

## 2013-04-22 ENCOUNTER — Ambulatory Visit (HOSPITAL_BASED_OUTPATIENT_CLINIC_OR_DEPARTMENT_OTHER): Payer: Medicaid Other

## 2013-04-22 ENCOUNTER — Ambulatory Visit (HOSPITAL_BASED_OUTPATIENT_CLINIC_OR_DEPARTMENT_OTHER): Payer: Self-pay | Admitting: Hematology and Oncology

## 2013-04-22 ENCOUNTER — Other Ambulatory Visit (HOSPITAL_BASED_OUTPATIENT_CLINIC_OR_DEPARTMENT_OTHER): Payer: Medicaid Other | Admitting: Lab

## 2013-04-22 ENCOUNTER — Encounter: Payer: Self-pay | Admitting: Hematology and Oncology

## 2013-04-22 VITALS — BP 122/72 | HR 92 | Temp 97.5°F | Resp 18 | Ht 72.0 in | Wt 154.1 lb

## 2013-04-22 DIAGNOSIS — G629 Polyneuropathy, unspecified: Secondary | ICD-10-CM

## 2013-04-22 DIAGNOSIS — C029 Malignant neoplasm of tongue, unspecified: Secondary | ICD-10-CM

## 2013-04-22 DIAGNOSIS — C770 Secondary and unspecified malignant neoplasm of lymph nodes of head, face and neck: Secondary | ICD-10-CM

## 2013-04-22 DIAGNOSIS — Z5111 Encounter for antineoplastic chemotherapy: Secondary | ICD-10-CM

## 2013-04-22 DIAGNOSIS — L270 Generalized skin eruption due to drugs and medicaments taken internally: Secondary | ICD-10-CM

## 2013-04-22 DIAGNOSIS — C787 Secondary malignant neoplasm of liver and intrahepatic bile duct: Secondary | ICD-10-CM

## 2013-04-22 DIAGNOSIS — C7951 Secondary malignant neoplasm of bone: Secondary | ICD-10-CM

## 2013-04-22 DIAGNOSIS — R911 Solitary pulmonary nodule: Secondary | ICD-10-CM

## 2013-04-22 DIAGNOSIS — E441 Mild protein-calorie malnutrition: Secondary | ICD-10-CM

## 2013-04-22 DIAGNOSIS — C779 Secondary and unspecified malignant neoplasm of lymph node, unspecified: Secondary | ICD-10-CM

## 2013-04-22 DIAGNOSIS — F419 Anxiety disorder, unspecified: Secondary | ICD-10-CM

## 2013-04-22 DIAGNOSIS — C801 Malignant (primary) neoplasm, unspecified: Secondary | ICD-10-CM

## 2013-04-22 DIAGNOSIS — C7800 Secondary malignant neoplasm of unspecified lung: Secondary | ICD-10-CM

## 2013-04-22 DIAGNOSIS — C7952 Secondary malignant neoplasm of bone marrow: Secondary | ICD-10-CM

## 2013-04-22 DIAGNOSIS — T451X5A Adverse effect of antineoplastic and immunosuppressive drugs, initial encounter: Secondary | ICD-10-CM

## 2013-04-22 DIAGNOSIS — IMO0002 Reserved for concepts with insufficient information to code with codable children: Secondary | ICD-10-CM

## 2013-04-22 LAB — BASIC METABOLIC PANEL (CC13)
CO2: 26 mEq/L (ref 22–29)
Calcium: 9.6 mg/dL (ref 8.4–10.4)
Creatinine: 0.6 mg/dL — ABNORMAL LOW (ref 0.7–1.3)

## 2013-04-22 LAB — CBC WITH DIFFERENTIAL/PLATELET
BASO%: 0.4 % (ref 0.0–2.0)
Basophils Absolute: 0 10*3/uL (ref 0.0–0.1)
EOS%: 3.3 % (ref 0.0–7.0)
HGB: 10.3 g/dL — ABNORMAL LOW (ref 13.0–17.1)
MCH: 31.6 pg (ref 27.2–33.4)
MCHC: 32.1 g/dL (ref 32.0–36.0)
MONO#: 0.5 10*3/uL (ref 0.1–0.9)
RDW: 14.5 % (ref 11.0–14.6)
WBC: 5.7 10*3/uL (ref 4.0–10.3)
lymph#: 0.6 10*3/uL — ABNORMAL LOW (ref 0.9–3.3)

## 2013-04-22 MED ORDER — PACLITAXEL CHEMO INJECTION 300 MG/50ML
48.0000 mg/m2 | Freq: Once | INTRAVENOUS | Status: AC
  Administered 2013-04-22: 90 mg via INTRAVENOUS
  Filled 2013-04-22: qty 15

## 2013-04-22 MED ORDER — LORAZEPAM 0.5 MG PO TABS: 0.5000 mg | ORAL_TABLET | Freq: Four times a day (QID) | ORAL | Status: DC | PRN

## 2013-04-22 MED ORDER — DEXAMETHASONE SODIUM PHOSPHATE 20 MG/5ML IJ SOLN
20.0000 mg | Freq: Once | INTRAMUSCULAR | Status: AC
  Administered 2013-04-22: 20 mg via INTRAVENOUS

## 2013-04-22 MED ORDER — SODIUM CHLORIDE 0.9 % IV SOLN
Freq: Once | INTRAVENOUS | Status: AC
  Administered 2013-04-22: 10:00:00 via INTRAVENOUS

## 2013-04-22 MED ORDER — ONDANSETRON 16 MG/50ML IVPB (CHCC)
16.0000 mg | Freq: Once | INTRAVENOUS | Status: AC
  Administered 2013-04-22: 16 mg via INTRAVENOUS

## 2013-04-22 MED ORDER — DIPHENHYDRAMINE HCL 50 MG/ML IJ SOLN
50.0000 mg | Freq: Once | INTRAMUSCULAR | Status: AC
  Administered 2013-04-22: 50 mg via INTRAVENOUS

## 2013-04-22 MED ORDER — GABAPENTIN 300 MG PO CAPS: 300.0000 mg | ORAL_CAPSULE | Freq: Three times a day (TID) | ORAL | Status: DC

## 2013-04-22 MED ORDER — CETUXIMAB CHEMO IV INJECTION 200 MG/100ML
166.6667 mg/m2 | Freq: Once | INTRAVENOUS | Status: AC
  Administered 2013-04-22: 300 mg via INTRAVENOUS
  Filled 2013-04-22: qty 150

## 2013-04-22 MED ORDER — CARBOPLATIN CHEMO INTRADERMAL TEST DOSE 100MCG/0.02ML
100.0000 ug | Freq: Once | INTRADERMAL | Status: AC
  Administered 2013-04-22: 100 ug via INTRADERMAL
  Filled 2013-04-22: qty 0.01

## 2013-04-22 MED ORDER — FAMOTIDINE IN NACL 20-0.9 MG/50ML-% IV SOLN
20.0000 mg | Freq: Once | INTRAVENOUS | Status: AC
  Administered 2013-04-22: 20 mg via INTRAVENOUS

## 2013-04-22 MED ORDER — HEPARIN SOD (PORK) LOCK FLUSH 100 UNIT/ML IV SOLN
500.0000 [IU] | Freq: Once | INTRAVENOUS | Status: AC | PRN
  Administered 2013-04-22: 500 [IU]
  Filled 2013-04-22: qty 5

## 2013-04-22 MED ORDER — SODIUM CHLORIDE 0.9 % IV SOLN
180.0000 mg | Freq: Once | INTRAVENOUS | Status: AC
  Administered 2013-04-22: 180 mg via INTRAVENOUS
  Filled 2013-04-22: qty 18

## 2013-04-22 MED ORDER — SODIUM CHLORIDE 0.9 % IJ SOLN
10.0000 mL | INTRAMUSCULAR | Status: DC | PRN
  Administered 2013-04-22: 10 mL
  Filled 2013-04-22: qty 10

## 2013-04-22 NOTE — Progress Notes (Signed)
Called and left a message for Ms. Uvaldo Rising because need date of bills for patient to meet ded [phone]. 1st mess left 04/10/13 and no call back. I called and left on supervisor's email also to call me back with dates for him to get medicaid active again.

## 2013-04-22 NOTE — Patient Instructions (Addendum)
[facility] Discharge Instructions for Patients Receiving Chemotherapy  Today you received the following chemotherapy agents Erbitux/Taxol/Carboplatin.  To help prevent nausea and vomiting after your treatment, we encourage you to take your nausea medication as prescribed.   If you develop nausea and vomiting that is not controlled by your nausea medication, call the clinic.   BELOW ARE SYMPTOMS THAT SHOULD BE REPORTED IMMEDIATELY:  *FEVER GREATER THAN 100.5 F  *CHILLS WITH OR WITHOUT FEVER  NAUSEA AND VOMITING THAT IS NOT CONTROLLED WITH YOUR NAUSEA MEDICATION  *UNUSUAL SHORTNESS OF BREATH  *UNUSUAL BRUISING OR BLEEDING  TENDERNESS IN MOUTH AND THROAT WITH OR WITHOUT PRESENCE OF ULCERS  *URINARY PROBLEMS  *BOWEL PROBLEMS  UNUSUAL RASH Items with * indicate a potential emergency and should be followed up as soon as possible.  Feel free to call the clinic you have any questions or concerns. The clinic phone number is [phone].

## 2013-04-23 NOTE — Addendum Note (Signed)
Addended by: Neita Goodnight on: 04/23/2013 08:12 AM   Modules accepted: Orders

## 2013-04-26 ENCOUNTER — Other Ambulatory Visit: Payer: Self-pay | Admitting: Hematology and Oncology

## 2013-04-26 ENCOUNTER — Telehealth: Payer: Self-pay | Admitting: Hematology and Oncology

## 2013-04-26 ENCOUNTER — Encounter: Payer: Self-pay | Admitting: Oncology

## 2013-04-26 ENCOUNTER — Other Ambulatory Visit: Payer: Self-pay | Admitting: *Deleted

## 2013-04-26 ENCOUNTER — Telehealth: Payer: Self-pay | Admitting: *Deleted

## 2013-04-26 NOTE — Telephone Encounter (Signed)
Treatment schedule Reviewed w/ dr. Alecia Lemming.  He would like to see pt on Monday to discuss treatment plan.  Ok to see pt at 8 am on 8/18.  Notified pt of appt time.  He agreed.

## 2013-04-26 NOTE — Telephone Encounter (Signed)
pt sched to see Dr. Alecia Lemming PP contacted pt.

## 2013-04-29 ENCOUNTER — Ambulatory Visit: Payer: Self-pay

## 2013-04-29 ENCOUNTER — Ambulatory Visit (HOSPITAL_BASED_OUTPATIENT_CLINIC_OR_DEPARTMENT_OTHER): Payer: Medicaid Other | Admitting: Hematology and Oncology

## 2013-04-29 ENCOUNTER — Other Ambulatory Visit: Payer: Self-pay | Admitting: Certified Registered Nurse Anesthetist

## 2013-04-29 ENCOUNTER — Ambulatory Visit (HOSPITAL_BASED_OUTPATIENT_CLINIC_OR_DEPARTMENT_OTHER): Payer: Medicaid Other

## 2013-04-29 ENCOUNTER — Other Ambulatory Visit (HOSPITAL_BASED_OUTPATIENT_CLINIC_OR_DEPARTMENT_OTHER): Payer: Medicaid Other | Admitting: Lab

## 2013-04-29 VITALS — BP 125/76 | HR 92 | Temp 98.8°F | Resp 20 | Ht 72.0 in | Wt 153.2 lb

## 2013-04-29 DIAGNOSIS — C7800 Secondary malignant neoplasm of unspecified lung: Secondary | ICD-10-CM

## 2013-04-29 DIAGNOSIS — C7951 Secondary malignant neoplasm of bone: Secondary | ICD-10-CM

## 2013-04-29 DIAGNOSIS — C029 Malignant neoplasm of tongue, unspecified: Secondary | ICD-10-CM

## 2013-04-29 DIAGNOSIS — C770 Secondary and unspecified malignant neoplasm of lymph nodes of head, face and neck: Secondary | ICD-10-CM

## 2013-04-29 DIAGNOSIS — C801 Malignant (primary) neoplasm, unspecified: Secondary | ICD-10-CM

## 2013-04-29 DIAGNOSIS — Z5112 Encounter for antineoplastic immunotherapy: Secondary | ICD-10-CM

## 2013-04-29 LAB — CBC WITH DIFFERENTIAL/PLATELET
Basophils Absolute: 0 10*3/uL (ref 0.0–0.1)
EOS%: 3.4 % (ref 0.0–7.0)
Eosinophils Absolute: 0.2 10*3/uL (ref 0.0–0.5)
HCT: 31.8 % — ABNORMAL LOW (ref 38.4–49.9)
HGB: 10.7 g/dL — ABNORMAL LOW (ref 13.0–17.1)
MONO#: 0.3 10*3/uL (ref 0.1–0.9)
NEUT#: 3.9 10*3/uL (ref 1.5–6.5)
NEUT%: 77.2 % — ABNORMAL HIGH (ref 39.0–75.0)
RDW: 15.5 % — ABNORMAL HIGH (ref 11.0–14.6)
WBC: 5.1 10*3/uL (ref 4.0–10.3)
lymph#: 0.6 10*3/uL — ABNORMAL LOW (ref 0.9–3.3)

## 2013-04-29 LAB — BASIC METABOLIC PANEL (CC13)
CO2: 28 mEq/L (ref 22–29)
Calcium: 9.9 mg/dL (ref 8.4–10.4)
Creatinine: 0.7 mg/dL (ref 0.7–1.3)
Sodium: 141 mEq/L (ref 136–145)

## 2013-04-29 MED ORDER — SODIUM CHLORIDE 0.9 % IV SOLN
180.0000 mg | Freq: Once | INTRAVENOUS | Status: AC
  Administered 2013-04-29: 180 mg via INTRAVENOUS
  Filled 2013-04-29: qty 18

## 2013-04-29 MED ORDER — PACLITAXEL CHEMO INJECTION 300 MG/50ML
48.0000 mg/m2 | Freq: Once | INTRAVENOUS | Status: AC
  Administered 2013-04-29: 90 mg via INTRAVENOUS
  Filled 2013-04-29: qty 15

## 2013-04-29 MED ORDER — ONDANSETRON 16 MG/50ML IVPB (CHCC)
16.0000 mg | Freq: Once | INTRAVENOUS | Status: AC
  Administered 2013-04-29: 16 mg via INTRAVENOUS

## 2013-04-29 MED ORDER — CARBOPLATIN CHEMO INTRADERMAL TEST DOSE 100MCG/0.02ML
100.0000 ug | Freq: Once | INTRADERMAL | Status: AC
  Administered 2013-04-29: 100 ug via INTRADERMAL
  Filled 2013-04-29: qty 0.01

## 2013-04-29 MED ORDER — DIPHENHYDRAMINE HCL 50 MG/ML IJ SOLN
50.0000 mg | Freq: Once | INTRAMUSCULAR | Status: AC
  Administered 2013-04-29: 50 mg via INTRAVENOUS

## 2013-04-29 MED ORDER — DEXAMETHASONE SODIUM PHOSPHATE 20 MG/5ML IJ SOLN
20.0000 mg | Freq: Once | INTRAMUSCULAR | Status: AC
  Administered 2013-04-29: 20 mg via INTRAVENOUS

## 2013-04-29 MED ORDER — SODIUM CHLORIDE 0.9 % IJ SOLN
10.0000 mL | INTRAMUSCULAR | Status: DC | PRN
  Administered 2013-04-29: 10 mL
  Filled 2013-04-29: qty 10

## 2013-04-29 MED ORDER — SODIUM CHLORIDE 0.9 % IV SOLN
Freq: Once | INTRAVENOUS | Status: AC
  Administered 2013-04-29: 10:00:00 via INTRAVENOUS

## 2013-04-29 MED ORDER — CETUXIMAB CHEMO IV INJECTION 200 MG/100ML
166.6667 mg/m2 | Freq: Once | INTRAVENOUS | Status: AC
  Administered 2013-04-29: 300 mg via INTRAVENOUS
  Filled 2013-04-29: qty 150

## 2013-04-29 MED ORDER — ZOLEDRONIC ACID 4 MG/5ML IV CONC: 4.0000 mg | Freq: Once | INTRAVENOUS | Status: DC

## 2013-04-29 MED ORDER — HEPARIN SOD (PORK) LOCK FLUSH 100 UNIT/ML IV SOLN
500.0000 [IU] | Freq: Once | INTRAVENOUS | Status: AC | PRN
  Administered 2013-04-29: 500 [IU]
  Filled 2013-04-29: qty 5

## 2013-04-29 MED ORDER — FAMOTIDINE IN NACL 20-0.9 MG/50ML-% IV SOLN
20.0000 mg | Freq: Once | INTRAVENOUS | Status: AC
  Administered 2013-04-29: 20 mg via INTRAVENOUS

## 2013-04-29 NOTE — Patient Instructions (Addendum)
[facility] Discharge Instructions for Patients Receiving Chemotherapy  Today you received the following chemotherapy agents :  Erbitux, Taxol,  Carboplatin.  To help prevent nausea and vomiting after your treatment, we encourage you to take your nausea medication as instructed by your physician.   If you develop nausea and vomiting that is not controlled by your nausea medication, call the clinic.   BELOW ARE SYMPTOMS THAT SHOULD BE REPORTED IMMEDIATELY:  *FEVER GREATER THAN 100.5 F  *CHILLS WITH OR WITHOUT FEVER  NAUSEA AND VOMITING THAT IS NOT CONTROLLED WITH YOUR NAUSEA MEDICATION  *UNUSUAL SHORTNESS OF BREATH  *UNUSUAL BRUISING OR BLEEDING  TENDERNESS IN MOUTH AND THROAT WITH OR WITHOUT PRESENCE OF ULCERS  *URINARY PROBLEMS  *BOWEL PROBLEMS  UNUSUAL RASH Items with * indicate a potential emergency and should be followed up as soon as possible.  Feel free to call the clinic you have any questions or concerns. The clinic phone number is [phone].

## 2013-05-03 ENCOUNTER — Other Ambulatory Visit: Payer: Self-pay | Admitting: Hematology and Oncology

## 2013-05-03 ENCOUNTER — Encounter: Payer: Self-pay | Admitting: Oncology

## 2013-05-03 ENCOUNTER — Other Ambulatory Visit: Payer: Self-pay | Admitting: Oncology

## 2013-05-03 DIAGNOSIS — C029 Malignant neoplasm of tongue, unspecified: Secondary | ICD-10-CM

## 2013-05-03 NOTE — Progress Notes (Signed)
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 He developed biopsy proven metastatic disease in 03/2012 with met in right neck, left lung mass and left illac.   PAST THERAPY:  1. 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 He developed recurrence in the left submandibular gland in August 2012. He underwent on 07/27/2011 radical left neck dissection by Dr. Ezzard Standing and Dr. Geryl Rankins. Pathology case number [id number] showed total 15 lymph nodes from left radical neck dissection that was all negative. However the left submandibular gland mass showed a 3.5 cm invasive SCC, moderately differentiates involving the adjacent skeletal muscle tissue and bone a tissue and extending into the inked margin. There was no evidence of any lymphatic invasion or perineural invasion. He underwent further resection by Dr. Mary Sella at [facility] on 09/29/2011.  2. He received adjuvant chemoradiation therapy with q 3 week Cisplatin given 11/15/11-12/05/11 (received 2 of 3 doses; 3rd dose held due to febrile neutropenia and development of flap abscess). He received concurrent XRT 11/15/11 through 01/05/12.  3. For right neck recurrence in 03/2012; he underwent palliative right neck dissection by Dr. Manson Passey at [facility].   CURRENT THERAPY: started on 05/15/2012 palliative chemo weekly Carboplatin/Taxol/Erbitux; 3 weeks on; 1 week off. Due to prolonged cytopenia, his chemo has been changed to weekly; 2 weeks on, 1 week off.   INTERVAL HISTORY:  [name] 52 y.o. male returns for regular follow up visit. He reported no major changes. He continues to have rash on face, scalp, chest and back. His appetite  is good and he gain some weight. Patient still fells fatigue. He has diminished hearing secondary to treatment with cisplatin He has problems to swallow food because of absence of saliva. No odynophagia or dysphagia.  He continues to have chest pain secondary to surgery. Patient complains on a little bit nausea and constipation after chemotherapy. He has anxiety and Zoloft and Ativan is helpful. The patient denied fever, chills, night sweats. He denied headaches, double vision, blurry vision, nasal congestion, nasal discharge. No  palpitations, dyspnea, cough, abdominal pain, vomiting, diarrhea,  hematochezia. The patient denied dysuria, nocturia, polyuria, hematuria, myalgia, numbness, tingling. Review of Systems  Constitutional: Positive for malaise/fatigue. Negative for fever, chills, weight loss and diaphoresis.  HENT: Positive for hearing loss. Negative for ear pain, nosebleeds, congestion, sore throat, neck pain and tinnitus.   Eyes: Negative for blurred vision, double vision, photophobia and pain.  Respiratory: Negative for cough, hemoptysis, sputum production, shortness of breath, wheezing and stridor.   Cardiovascular: Positive for chest pain. Negative for palpitations, orthopnea, claudication, leg swelling and PND.  Gastrointestinal: Positive for nausea and constipation. Negative for heartburn, vomiting, abdominal pain, diarrhea, blood in stool and melena.  Genitourinary: Negative for dysuria, urgency, frequency, hematuria and flank pain.  Musculoskeletal: Negative for myalgias, back pain, joint pain and falls.  Skin: Positive for rash. Negative for itching.  Neurological: Negative for dizziness, tingling, tremors, sensory change, speech change, focal weakness, seizures, loss of consciousness, weakness and headaches.  Endo/Heme/Allergies: Does not bruise/bleed easily.  Psychiatric/Behavioral: Negative for depression, suicidal ideas, hallucinations and substance abuse. The patient is  nervous/anxious.      PAST MEDICAL HISTORY: Past Medical History  Diagnosis  Date  . Hemorrhoids   . Hypertension     no medications at present  . GERD (gastroesophageal reflux disease)     no medication  . Depression     treated approx 12 years ago, no treatment at this time  . Hiatal hernia   . Hx of radiation therapy 11/15/11 -01/05/12    left lateral tongue  . Lesion of left lung 03/14/12    CT chest, [facility], squamous cell carcinoma  . Facial rash 05/24/2012  . History of radiation therapy 07/24/12-07/26/12    left iliac bone, 18Gy/3 sessions  . Tongue cancer 12/2010    left side of tongue T1 lesion excised 4/12  . Lung metastasis dx'd 03/2012  . Bone metastasis 04/25/12    PET scan-left iliac bone    PAST SURGICAL HISTORY: Past Surgical History  Procedure Laterality Date  . Inguinal herniorrhapy    . Tonsillectomy and adenoidectomy    . Excision of tongue mass      April 2012  . Hernia repair    . Neck mass excision  07/27/2011  . Radical neck dissection  07/27/2011    Procedure: RADICAL NECK DISSECTION;  Surgeon: Drema Halon, MD;  Location: [facility] OR;  Service: ENT;  Laterality: Left;  Marland Kitchen Mandibulectomy  09/2011    and scapular free flap  . Modified radical neck dissection  03/27/12    excision of lip lesion, lip benign, right neck squamous cell ca    FAMILY HISTORY Family History  Problem Relation Age of Onset  . Adopted: Yes  . Other      biological parents may have had cerebral aneurysm   . Vision loss Paternal Aunt    HEALTH MAINTENANCE: History  Substance Use Topics  . Smoking status: Former Smoker -- 0.25 packs/day for 20 years    Types: Cigarettes    Quit date: 07/13/2011  . Smokeless tobacco: Never Used  . Alcohol Use: Yes     Comment: approx. 2 beers or wine per week    Allergies  Allergen Reactions  . Codeine Phosphate     REACTION: rash, swelling  . Morphine And Related   . Tape     Blisters; please use paper tape    Current Outpatient  Prescriptions  Medication Sig Dispense Refill  . clindamycin (CLINDAGEL) 1 % gel Apply topically as needed. Apply to facial and chest skin rash.  30 g  3  . clobetasol cream (TEMOVATE) 0.05 % Apply topically as needed. Apply to rash BID      . fentaNYL (DURAGESIC - DOSED MCG/HR) 25 MCG/HR Place 1 patch (25 mcg total) onto the skin every 3 (three) days.  10 patch  0  . HYDROcodone-acetaminophen (NORCO/VICODIN) 5-325 MG per tablet TAKE 1 TABLET BY MOUTH EVERY 6 HOURS AS NEEDED FOR PAIN  90 tablet  0  . hydrocortisone cream 0.5 % Apply topically 2 (two) times daily as needed. Apply to axilla rash twice daily as needed.      . lidocaine-prilocaine (EMLA) cream Apply topically as needed. Apply to [facility] site one hour prior to needle stick to numb skin.  30 g  3  . Nutritional Supplements (FEEDING SUPPLEMENT, OSMOLITE 1.5 CAL,) LIQD Begin Osmolite 1.5 at 60 ml/hr for 12 hours nocturnal continuous feeding daily. Flush with 60 ml free water before and after continuous feedings. Continue 1 can of Osmolite 1.5 via gravity feeding TID with 60 ml free water before and after each bolus feeding. Please send pump to  patient.  1422 mL  0  . omeprazole (PRILOSEC) 10 MG capsule Take 10 mg by mouth daily.      . ondansetron (ZOFRAN) 8 MG tablet Take 8 mg by mouth every 12 (twelve) hours as needed.      . prochlorperazine (COMPAZINE) 10 MG tablet Take 1 tablet (10 mg total) by mouth every 6 (six) hours as needed.  60 tablet  1  . sertraline (ZOLOFT) 50 MG tablet Take 75 mg by mouth daily.       . sodium fluoride (PREVIDENT 5000 PLUS) 1.1 % CREA dental cream Apply thin ribbon of cream to tooth brush. Brush teeth for 2 minutes. Spit out excess-DO NOT swallow. DO NOT rinse afterwards. Repeat nightly.  1 Tube  prn  . gabapentin (NEURONTIN) 300 MG capsule Take 1 capsule (300 mg total) by mouth 3 (three) times daily.  90 capsule  3  . LORazepam (ATIVAN) 0.5 MG tablet Place 1 tablet (0.5 mg total) under the tongue every 6  (six) hours as needed for anxiety (nausea/vomiting).  30 tablet  0   No current facility-administered medications for this visit.    OBJECTIVE: Filed Vitals:   04/22/13 0833  BP: 122/72  Pulse: 92  Temp: 97.5 F (36.4 C)  Resp: 18     Body mass index is 20.9 kg/(m^2).    ECOG FS:2  HEENT: Sclerae anicteric.  Conjunctivae were pink. Pupils round and reactive bilaterally. Oral mucosa is moist without ulceration or thrush. No occipital, submandibular,  supraclavicular or axillar adenopathy. He has nodules on right side of his neck. Lungs: clear to auscultation without wheezes. No rales or rhonchi. Heart: regular rate and rhythm. No murmur, gallop or rubs. Abdomen: soft, non tender. No guarding or rebound tenderness. Bowel sounds are present. No palpable hepatosplenomegaly. MSK: no focal spinal tenderness. Extremities: No clubbing or cyanosis.No calf tenderness to palpitation, no peripheral edema. The patient had grossly intact strength in upper and lower extremities Neuro: non-focal, alert and oriented to time, person and place, appropriate affect Skin: patient has rash on face, chests scalp.  LAB RESULTS:  CMP     Component Value Date/Time   NA 141 04/29/2013 0910   NA 138 06/25/2012 0858   K 3.9 04/29/2013 0910   K 3.1* 06/25/2012 0858   CL 102 02/25/2013 0753   CL 99 06/25/2012 0858   CO2 28 04/29/2013 0910   CO2 30 06/25/2012 0858   GLUCOSE 93 04/29/2013 0910   GLUCOSE 86 02/25/2013 0753   GLUCOSE 88 06/25/2012 0858   BUN 6.8* 04/29/2013 0910   BUN 10 06/25/2012 0858   CREATININE 0.7 04/29/2013 0910   CREATININE 0.47* 06/25/2012 0858   CALCIUM 9.9 04/29/2013 0910   CALCIUM 9.6 06/25/2012 0858   PROT 7.3 02/25/2013 0753   PROT 6.8 05/03/2012 0905   ALBUMIN 3.7 02/25/2013 0753   ALBUMIN 3.8 05/03/2012 0905   AST 19 02/25/2013 0753   AST 14 05/03/2012 0905   ALT 15 02/25/2013 0753   ALT 11 05/03/2012 0905   ALKPHOS 52 02/25/2013 0753   ALKPHOS 54 05/03/2012 0905   BILITOT 0.26  02/25/2013 0753   BILITOT 0.3 05/03/2012 0905   GFRNONAA >90 12/29/2011 0350   GFRAA >90 12/29/2011 0350    Lab Results  Component Value Date   WBC 5.1 04/29/2013   NEUTROABS 3.9 04/29/2013   HGB 10.7* 04/29/2013   HCT 31.8* 04/29/2013   MCV 97.0 04/29/2013   PLT 248 04/29/2013      Chemistry  Component Value Date/Time   NA 141 04/29/2013 0910   NA 138 06/25/2012 0858   K 3.9 04/29/2013 0910   K 3.1* 06/25/2012 0858   CL 102 02/25/2013 0753   CL 99 06/25/2012 0858   CO2 28 04/29/2013 0910   CO2 30 06/25/2012 0858   BUN 6.8* 04/29/2013 0910   BUN 10 06/25/2012 0858   CREATININE 0.7 04/29/2013 0910   CREATININE 0.47* 06/25/2012 0858      Component Value Date/Time   CALCIUM 9.9 04/29/2013 0910   CALCIUM 9.6 06/25/2012 0858   ALKPHOS 52 02/25/2013 0753   ALKPHOS 54 05/03/2012 0905   AST 19 02/25/2013 0753   AST 14 05/03/2012 0905   ALT 15 02/25/2013 0753   ALT 11 05/03/2012 0905   BILITOT 0.26 02/25/2013 0753   BILITOT 0.3 05/03/2012 0905      ASSESSMENT AND PLAN:  1. Recurrent left lateral tongue squamous cell carcinoma: He now has metastatic disease to the lung and bone.  -His restaging CT from 02/18/13 showed  enlargement of the left cavitary lesion. There was no convincing evidence of disease progression anywhere else.  - He was evaluated at [facility] to see if he is a candidate for any clinical trial. - We will proceed with another treatment Carboplatin/Taxol/Erbitux today. 2. Skin rash secondary to Erbitux. He is on Cleocin cream and Hydrocortisone cream prn.  3. Jaw bone pain due to neuralgia: he is on Neurontin with response.  4. Bone met: limited. S/p palliative XRT. He has been on Zometa every 6 weeks.  5. Anemia: This is from chemo. There is no active bleeding. No transfusion indicated.  5. Mild calorie/Protein malnutrition: stable weight.  6. Smoking: none at this time.  7. Follow up: in 1 week   Myra Rude, MD   04/22/2013  1:41 PM

## 2013-05-03 NOTE — Progress Notes (Signed)
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 He developed biopsy proven metastatic disease in 03/2012 with met in right neck, left lung mass and left illac.   PAST THERAPY:  1. 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 He developed recurrence in the left submandibular gland in August 2012. He underwent on 07/27/2011 radical left neck dissection by Dr. Ezzard Standing and Dr. Geryl Rankins. Pathology case number [id number] showed total 15 lymph nodes from left radical neck dissection that was all negative. However the left submandibular gland mass showed a 3.5 cm invasive SCC, moderately differentiates involving the adjacent skeletal muscle tissue and bone a tissue and extending into the inked margin. There was no evidence of any lymphatic invasion or perineural invasion. He underwent further resection by Dr. Mary Sella at [facility] on 09/29/2011.  2. He received adjuvant chemoradiation therapy with q 3 week Cisplatin given 11/15/11-12/05/11 (received 2 of 3 doses; 3rd dose held due to febrile neutropenia and development of flap abscess). He received concurrent XRT 11/15/11 through 01/05/12.  3. For right neck recurrence in 03/2012; he underwent palliative right neck dissection by Dr. Manson Passey at [facility].   CURRENT THERAPY: started on 05/15/2012 palliative chemo weekly Carboplatin/Taxol/Erbitux; 3 weeks on; 1 week off. Due to prolonged cytopenia, his chemo has been changed to weekly; 2 weeks on, 1 week off.    INTERVAL HISTORY:  Thomas Bean 52 y.o. male returns for regular follow up visit.  He reported that his rash is better. Appetite is good. Weight is stable. He can swallow liquids,  ice cream, pill. He has problem with secretion of saliva. Occasionally he has nausea which controlled well with medications. Sometimes, may be once a week,he has constipation which he handle with MiraLax. Patient complaints on numbness in sole, jaw and left shoulder and occasional pain in hip for 5-6 months.  The patient denied fever, chills, night sweats, change in appetite or weight. He denied headaches, double vision, blurry vision, nasal congestion, nasal discharge, hearing problems, odynophagia or dysphagia. No chest pain, palpitations, dyspnea, cough, abdominal pain, nausea, vomiting, diarrhea, constipation, hematochezia. The patient denied dysuria, nocturia, polyuria, hematuria, myalgia, numbness, tingling, psychiatric problems.  PAST MEDICAL HISTORY: Past Medical History  Diagnosis Date  . Hemorrhoids   . Hypertension     no medications at present  . GERD (gastroesophageal reflux disease)     no medication  . Depression     treated approx 12 years ago, no treatment at this time  . Hiatal hernia   . Hx of radiation therapy 11/15/11 -01/05/12    left lateral tongue  . Lesion of left lung 03/14/12    CT chest, [facility], squamous cell carcinoma  . Facial rash 05/24/2012  . History of radiation therapy 07/24/12-07/26/12    left iliac bone, 18Gy/3 sessions  . Tongue cancer 12/2010    left side of tongue T1 lesion excised 4/12  . Lung metastasis dx'd 03/2012  . Bone metastasis 04/25/12    PET scan-left iliac bone    PAST SURGICAL HISTORY: Past Surgical History  Procedure Laterality Date  . Inguinal herniorrhapy    . Tonsillectomy and adenoidectomy    . Excision of tongue mass  April 2012  . Hernia repair    . Neck mass excision  07/27/2011  . Radical neck dissection  07/27/2011    Procedure: RADICAL NECK DISSECTION;  Surgeon: Drema Halon, MD;  Location: [facility] OR;  Service: ENT;  Laterality: Left;  Marland Kitchen Mandibulectomy  09/2011    and scapular free flap  . Modified radical neck  dissection  03/27/12    excision of lip lesion, lip benign, right neck squamous cell ca    FAMILY HISTORY Family History  Problem Relation Age of Onset  . Adopted: Yes  . Other      biological parents may have had cerebral aneurysm   . Vision loss Paternal Aunt     GYNECOLOGIC HISTORY:   SOCIAL HISTORY:      ADVANCED DIRECTIVES:    HEALTH MAINTENANCE: History  Substance Use Topics  . Smoking status: Former Smoker -- 0.25 packs/day for 20 years    Types: Cigarettes    Quit date: 07/13/2011  . Smokeless tobacco: Never Used  . Alcohol Use: Yes     Comment: approx. 2 beers or wine per week     Colonoscopy:  PAP:  Bone density:  Lipid panel:  Allergies  Allergen Reactions  . Codeine Phosphate     REACTION: rash, swelling  . Morphine And Related   . Tape     Blisters; please use paper tape    Current Outpatient Prescriptions  Medication Sig Dispense Refill  . clindamycin (CLINDAGEL) 1 % gel Apply topically as needed. Apply to facial and chest skin rash.  30 g  3  . clobetasol cream (TEMOVATE) 0.05 % Apply topically as needed. Apply to rash BID      . fentaNYL (DURAGESIC - DOSED MCG/HR) 25 MCG/HR Place 1 patch (25 mcg total) onto the skin every 3 (three) days.  10 patch  0  . gabapentin (NEURONTIN) 300 MG capsule Take 1 capsule (300 mg total) by mouth 3 (three) times daily.  90 capsule  3  . HYDROcodone-acetaminophen (NORCO/VICODIN) 5-325 MG per tablet TAKE 1 TABLET BY MOUTH EVERY 6 HOURS AS NEEDED FOR PAIN  90 tablet  0  . hydrocortisone cream 0.5 % Apply topically 2 (two) times daily as needed. Apply to axilla rash twice daily as needed.      . lidocaine-prilocaine (EMLA) cream Apply topically as needed. Apply to [facility] site one hour prior to needle stick to numb skin.  30 g  3  . LORazepam (ATIVAN) 0.5 MG tablet Place 1 tablet (0.5 mg total) under the tongue every 6 (six) hours as needed for anxiety (nausea/vomiting).  30 tablet  0  . Nutritional Supplements  (FEEDING SUPPLEMENT, OSMOLITE 1.5 CAL,) LIQD Begin Osmolite 1.5 at 60 ml/hr for 12 hours nocturnal continuous feeding daily. Flush with 60 ml free water before and after continuous feedings. Continue 1 can of Osmolite 1.5 via gravity feeding TID with 60 ml free water before and after each bolus feeding. Please send pump to patient.  1422 mL  0  . omeprazole (PRILOSEC) 10 MG capsule Take 10 mg by mouth daily.      . prochlorperazine (COMPAZINE) 10 MG tablet Take 1 tablet (10 mg total) by mouth every 6 (six) hours as needed.  60 tablet  1  . sertraline (ZOLOFT) 50 MG tablet Take 75 mg by mouth daily.       . sodium fluoride (PREVIDENT 5000 PLUS) 1.1 % CREA dental cream Apply thin ribbon of cream to tooth brush. Brush  teeth for 2 minutes. Spit out excess-DO NOT swallow. DO NOT rinse afterwards. Repeat nightly.  1 Tube  prn  . ondansetron (ZOFRAN) 8 MG tablet Take 8 mg by mouth every 12 (twelve) hours as needed.       No current facility-administered medications for this visit.    OBJECTIVE: Filed Vitals:   04/29/13 0808  BP: 125/76  Pulse: 92  Temp: 98.8 F (37.1 C)  Resp: 20     Body mass index is 20.77 kg/(m^2).    ECOG FS:  HEENT: Sclerae anicteric.  Conjunctivae were pink. Pupils round and reactive bilaterally. Oral mucosa is moist without ulceration or thrush. He has cervical nodules on right side of his neck. No occipital, submandibular, supraclavicular or axillar adenopathy. Lungs: clear to auscultation without wheezes. No rales or rhonchi. Heart: regular rate and rhythm. No murmur, gallop or rubs. Abdomen: soft, non tender. No guarding or rebound tenderness. Bowel sounds are present. No palpable hepatosplenomegaly. MSK: no focal spinal tenderness. Extremities: No clubbing or cyanosis.No calf tenderness to palpitation, no peripheral edema. The patient had grossly intact strength in upper and lower extremities. Skin: patient has rash on face, chests scalp. Neuro: non-focal, alert and  oriented to time, person and place, appropriate affect  LAB RESULTS:  CMP     Component Value Date/Time   NA 141 04/29/2013 0910   NA 138 06/25/2012 0858   K 3.9 04/29/2013 0910   K 3.1* 06/25/2012 0858   CL 102 02/25/2013 0753   CL 99 06/25/2012 0858   CO2 28 04/29/2013 0910   CO2 30 06/25/2012 0858   GLUCOSE 93 04/29/2013 0910   GLUCOSE 86 02/25/2013 0753   GLUCOSE 88 06/25/2012 0858   BUN 6.8* 04/29/2013 0910   BUN 10 06/25/2012 0858   CREATININE 0.7 04/29/2013 0910   CREATININE 0.47* 06/25/2012 0858   CALCIUM 9.9 04/29/2013 0910   CALCIUM 9.6 06/25/2012 0858   PROT 7.3 02/25/2013 0753   PROT 6.8 05/03/2012 0905   ALBUMIN 3.7 02/25/2013 0753   ALBUMIN 3.8 05/03/2012 0905   AST 19 02/25/2013 0753   AST 14 05/03/2012 0905   ALT 15 02/25/2013 0753   ALT 11 05/03/2012 0905   ALKPHOS 52 02/25/2013 0753   ALKPHOS 54 05/03/2012 0905   BILITOT 0.26 02/25/2013 0753   BILITOT 0.3 05/03/2012 0905   GFRNONAA >90 12/29/2011 0350   GFRAA >90 12/29/2011 0350    Lab Results  Component Value Date   WBC 5.1 04/29/2013   NEUTROABS 3.9 04/29/2013   HGB 10.7* 04/29/2013   HCT 31.8* 04/29/2013   MCV 97.0 04/29/2013   PLT 248 04/29/2013      Chemistry      Component Value Date/Time   NA 141 04/29/2013 0910   NA 138 06/25/2012 0858   K 3.9 04/29/2013 0910   K 3.1* 06/25/2012 0858   CL 102 02/25/2013 0753   CL 99 06/25/2012 0858   CO2 28 04/29/2013 0910   CO2 30 06/25/2012 0858   BUN 6.8* 04/29/2013 0910   BUN 10 06/25/2012 0858   CREATININE 0.7 04/29/2013 0910   CREATININE 0.47* 06/25/2012 0858      Component Value Date/Time   CALCIUM 9.9 04/29/2013 0910   CALCIUM 9.6 06/25/2012 0858   ALKPHOS 52 02/25/2013 0753   ALKPHOS 54 05/03/2012 0905   AST 19 02/25/2013 0753   AST 14 05/03/2012 0905   ALT 15 02/25/2013 0753   ALT 11 05/03/2012 0905   BILITOT 0.26 02/25/2013 0753  BILITOT 0.3 05/03/2012 0905      ASSESSMENT AND PLAN:  1. 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  -His restaging CT from 02/18/13 showed enlargement of the left cavitary lesion. There was no convincing evidence of disease progression anywhere else.  - He was evaluated at [facility] to see if he is a candidate for any clinical trial.  - We will proceed with week 2 treatment Carboplatin/Taxol/Erbitux today.  - Consider cetuximab at dose 150 mg/m2 weekly in next cycle. 2. Skin rash secondary to Erbitux. He is on Cleocin cream and Hydrocortisone cream prn.  3. Jaw bone pain due to neuralgia: he is on Neurontin with response.  4. Bone met: limited. S/p palliative XRT. He has been on Zometa every 6 weeks. We will give Zometa on 05/14/13 5. Anemia: This is from chemo. There is no active bleeding. No transfusion indicated.  5. Mild calorie/Protein malnutrition: stable weight.  6. Smoking: none at this time.  7. Follow up: in 2  week     Myra Rude, MD   04/29/2013  1:46 PM

## 2013-05-06 ENCOUNTER — Encounter: Payer: Self-pay | Admitting: Oncology

## 2013-05-06 ENCOUNTER — Other Ambulatory Visit: Payer: Self-pay | Admitting: *Deleted

## 2013-05-06 ENCOUNTER — Telehealth: Payer: Self-pay

## 2013-05-06 NOTE — Telephone Encounter (Signed)
Message copied by Kallie Locks on Mon May 06, 2013  9:40 AM ------      Message from: Myrtis Ser      Created: Fri May 03, 2013  3:32 PM      Regarding: Schedule       Please let pt know that we are aware that he needs a schedule. It looks like Dr Alecia Lemming has sent a POF for chemo appts. He is off next week for chemo. He will resume the week of 9/1. ------

## 2013-05-08 ENCOUNTER — Encounter: Payer: Self-pay | Admitting: Hematology and Oncology

## 2013-05-08 ENCOUNTER — Other Ambulatory Visit: Payer: Self-pay | Admitting: *Deleted

## 2013-05-08 ENCOUNTER — Telehealth: Payer: Self-pay | Admitting: Oncology

## 2013-05-09 ENCOUNTER — Telehealth: Payer: Self-pay | Admitting: *Deleted

## 2013-05-09 DIAGNOSIS — C770 Secondary and unspecified malignant neoplasm of lymph nodes of head, face and neck: Secondary | ICD-10-CM

## 2013-05-09 DIAGNOSIS — C7951 Secondary malignant neoplasm of bone: Secondary | ICD-10-CM

## 2013-05-09 DIAGNOSIS — R6884 Jaw pain: Secondary | ICD-10-CM

## 2013-05-09 DIAGNOSIS — C801 Malignant (primary) neoplasm, unspecified: Secondary | ICD-10-CM

## 2013-05-09 DIAGNOSIS — C029 Malignant neoplasm of tongue, unspecified: Secondary | ICD-10-CM

## 2013-05-09 MED ORDER — HYDROCODONE-ACETAMINOPHEN 5-325 MG PO TABS: 1.0000 | ORAL_TABLET | Freq: Four times a day (QID) | ORAL | Status: DC | PRN

## 2013-05-09 NOTE — Telephone Encounter (Signed)
OK to refill his Hydrocodone. # 90 with no refills.

## 2013-05-09 NOTE — Telephone Encounter (Signed)
Pt left VM requesting refill on hydrocodone called into pharmacy.  He says they told him they have requested refill but have not heard back from [place].

## 2013-05-09 NOTE — Telephone Encounter (Signed)
Informed pt of refill called into [facility].  He verbalized understanding.

## 2013-05-14 ENCOUNTER — Ambulatory Visit (HOSPITAL_BASED_OUTPATIENT_CLINIC_OR_DEPARTMENT_OTHER): Payer: Self-pay | Admitting: Hematology and Oncology

## 2013-05-14 ENCOUNTER — Other Ambulatory Visit (HOSPITAL_BASED_OUTPATIENT_CLINIC_OR_DEPARTMENT_OTHER): Payer: Medicaid Other

## 2013-05-14 ENCOUNTER — Telehealth: Payer: Self-pay | Admitting: Hematology and Oncology

## 2013-05-14 ENCOUNTER — Ambulatory Visit (HOSPITAL_BASED_OUTPATIENT_CLINIC_OR_DEPARTMENT_OTHER): Payer: Medicaid Other

## 2013-05-14 VITALS — BP 133/70 | HR 82 | Temp 98.4°F | Resp 19 | Ht 72.0 in | Wt 157.7 lb

## 2013-05-14 DIAGNOSIS — C770 Secondary and unspecified malignant neoplasm of lymph nodes of head, face and neck: Secondary | ICD-10-CM

## 2013-05-14 DIAGNOSIS — R079 Chest pain, unspecified: Secondary | ICD-10-CM

## 2013-05-14 DIAGNOSIS — C7800 Secondary malignant neoplasm of unspecified lung: Secondary | ICD-10-CM

## 2013-05-14 DIAGNOSIS — C7951 Secondary malignant neoplasm of bone: Secondary | ICD-10-CM

## 2013-05-14 DIAGNOSIS — C029 Malignant neoplasm of tongue, unspecified: Secondary | ICD-10-CM

## 2013-05-14 DIAGNOSIS — D6481 Anemia due to antineoplastic chemotherapy: Secondary | ICD-10-CM

## 2013-05-14 DIAGNOSIS — Z5111 Encounter for antineoplastic chemotherapy: Secondary | ICD-10-CM

## 2013-05-14 DIAGNOSIS — R21 Rash and other nonspecific skin eruption: Secondary | ICD-10-CM

## 2013-05-14 DIAGNOSIS — Z5112 Encounter for antineoplastic immunotherapy: Secondary | ICD-10-CM

## 2013-05-14 DIAGNOSIS — R6884 Jaw pain: Secondary | ICD-10-CM

## 2013-05-14 LAB — CBC WITH DIFFERENTIAL/PLATELET
Basophils Absolute: 0 10*3/uL (ref 0.0–0.1)
Eosinophils Absolute: 0.3 10*3/uL (ref 0.0–0.5)
HCT: 32.2 % — ABNORMAL LOW (ref 38.4–49.9)
HGB: 10.4 g/dL — ABNORMAL LOW (ref 13.0–17.1)
NEUT#: 3.9 10*3/uL (ref 1.5–6.5)
NEUT%: 69.1 % (ref 39.0–75.0)
RDW: 14.9 % — ABNORMAL HIGH (ref 11.0–14.6)
lymph#: 0.9 10*3/uL (ref 0.9–3.3)

## 2013-05-14 LAB — BASIC METABOLIC PANEL (CC13)
CO2: 27 mEq/L (ref 22–29)
Calcium: 9.3 mg/dL (ref 8.4–10.4)
Chloride: 104 mEq/L (ref 98–109)
Creatinine: 0.6 mg/dL — ABNORMAL LOW (ref 0.7–1.3)
Sodium: 141 mEq/L (ref 136–145)

## 2013-05-14 MED ORDER — SODIUM CHLORIDE 0.9 % IV SOLN
180.0000 mg | Freq: Once | INTRAVENOUS | Status: AC
  Administered 2013-05-14: 180 mg via INTRAVENOUS
  Filled 2013-05-14: qty 18

## 2013-05-14 MED ORDER — DEXAMETHASONE SODIUM PHOSPHATE 20 MG/5ML IJ SOLN
20.0000 mg | Freq: Once | INTRAMUSCULAR | Status: AC
  Administered 2013-05-14: 20 mg via INTRAVENOUS

## 2013-05-14 MED ORDER — DIPHENHYDRAMINE HCL 50 MG/ML IJ SOLN
50.0000 mg | Freq: Once | INTRAMUSCULAR | Status: AC
  Administered 2013-05-14: 50 mg via INTRAVENOUS

## 2013-05-14 MED ORDER — ZOLEDRONIC ACID 4 MG/100ML IV SOLN
4.0000 mg | Freq: Once | INTRAVENOUS | Status: AC
  Administered 2013-05-14: 4 mg via INTRAVENOUS
  Filled 2013-05-14: qty 100

## 2013-05-14 MED ORDER — CARBOPLATIN CHEMO INTRADERMAL TEST DOSE 100MCG/0.02ML
100.0000 ug | Freq: Once | INTRADERMAL | Status: AC
  Administered 2013-05-14: 100 ug via INTRADERMAL
  Filled 2013-05-14: qty 0.01

## 2013-05-14 MED ORDER — FENTANYL 25 MCG/HR TD PT72: 1.0000 | MEDICATED_PATCH | TRANSDERMAL | Status: DC

## 2013-05-14 MED ORDER — PACLITAXEL CHEMO INJECTION 300 MG/50ML
48.0000 mg/m2 | Freq: Once | INTRAVENOUS | Status: AC
  Administered 2013-05-14: 90 mg via INTRAVENOUS
  Filled 2013-05-14: qty 15

## 2013-05-14 MED ORDER — CETUXIMAB CHEMO IV INJECTION 200 MG/100ML
150.0000 mg/m2 | Freq: Once | INTRAVENOUS | Status: AC
  Administered 2013-05-14: 300 mg via INTRAVENOUS
  Filled 2013-05-14: qty 150

## 2013-05-14 MED ORDER — HEPARIN SOD (PORK) LOCK FLUSH 100 UNIT/ML IV SOLN
500.0000 [IU] | Freq: Once | INTRAVENOUS | Status: AC | PRN
  Administered 2013-05-14: 500 [IU]
  Filled 2013-05-14: qty 5

## 2013-05-14 MED ORDER — SODIUM CHLORIDE 0.9 % IV SOLN
Freq: Once | INTRAVENOUS | Status: AC
  Administered 2013-05-14: 10:00:00 via INTRAVENOUS

## 2013-05-14 MED ORDER — FAMOTIDINE IN NACL 20-0.9 MG/50ML-% IV SOLN
20.0000 mg | Freq: Once | INTRAVENOUS | Status: AC
  Administered 2013-05-14: 20 mg via INTRAVENOUS

## 2013-05-14 MED ORDER — SODIUM CHLORIDE 0.9 % IJ SOLN
10.0000 mL | INTRAMUSCULAR | Status: DC | PRN
  Administered 2013-05-14: 10 mL
  Filled 2013-05-14: qty 10

## 2013-05-14 MED ORDER — ONDANSETRON 16 MG/50ML IVPB (CHCC)
16.0000 mg | Freq: Once | INTRAVENOUS | Status: AC
  Administered 2013-05-14: 16 mg via INTRAVENOUS

## 2013-05-14 NOTE — Patient Instructions (Addendum)
[facility] Discharge Instructions for Patients Receiving Chemotherapy  Today you received the following chemotherapy agents :  Erbitux, Taxol, Carboplatin, Zometa.  To help prevent nausea and vomiting after your treatment, we encourage you to take your nausea medication as instructed by your physician.   If you develop nausea and vomiting that is not controlled by your nausea medication, call the clinic.   BELOW ARE SYMPTOMS THAT SHOULD BE REPORTED IMMEDIATELY:  *FEVER GREATER THAN 100.5 F  *CHILLS WITH OR WITHOUT FEVER  NAUSEA AND VOMITING THAT IS NOT CONTROLLED WITH YOUR NAUSEA MEDICATION  *UNUSUAL SHORTNESS OF BREATH  *UNUSUAL BRUISING OR BLEEDING  TENDERNESS IN MOUTH AND THROAT WITH OR WITHOUT PRESENCE OF ULCERS  *URINARY PROBLEMS  *BOWEL PROBLEMS  UNUSUAL RASH Items with * indicate a potential emergency and should be followed up as soon as possible.  Feel free to call the clinic you have any questions or concerns. The clinic phone number is [phone].

## 2013-05-14 NOTE — Telephone Encounter (Signed)
Gave pt appt for lab, Md and chemo for September 2014 °

## 2013-05-15 ENCOUNTER — Other Ambulatory Visit: Payer: Self-pay | Admitting: Certified Registered Nurse Anesthetist

## 2013-05-15 NOTE — Progress Notes (Signed)
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 He developed biopsy proven metastatic disease in 03/2012 with met in right neck, left lung mass and left illac.   PAST THERAPY:  1. 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 He developed recurrence in the left submandibular gland in August 2012. He underwent on 07/27/2011 radical left neck dissection by Dr. Ezzard Standing and Dr. Geryl Rankins. Pathology case number [id number] showed total 15 lymph nodes from left radical neck dissection that was all negative. However the left submandibular gland mass showed a 3.5 cm invasive SCC, moderately differentiates involving the adjacent skeletal muscle tissue and bone a tissue and extending into the inked margin. There was no evidence of any lymphatic invasion or perineural invasion. He underwent further resection by Dr. Mary Sella at [facility] on 09/29/2011.  2. He received adjuvant chemoradiation therapy with q 3 week Cisplatin given 11/15/11-12/05/11 (received 2 of 3 doses; 3rd dose held due to febrile neutropenia and development of flap abscess). He received concurrent XRT 11/15/11 through 01/05/12.  3. For right neck recurrence in 03/2012; he underwent palliative right neck dissection by Dr. Manson Passey at [facility].   CURRENT THERAPY: started on 05/15/2012 palliative chemo weekly Carboplatin/Taxol/Erbitux; 3 weeks on; 1 week off. Due to prolonged cytopenia, his chemo has been changed to weekly; 2 weeks on, 1 week off.   INTERVAL HISTORY:  Thomas Bean 52 y.o. male returns for regular follow up visit. He reported no major changes. He continues to have rash on face, scalp, chest and back. His appetite is  good. Patient complains on positional (when he leaned forward or turned) pain in left side of his chest at site of surgery. Patient restarted Fentanyl patch. His hearing diminished after treatment with cisplatin. Occasionally he has nausea.  Patient handle his constipation with MiraLax. He had 2-3 episodes a week of minimal epistaxis. He reported dry eyes which he treated successfully with artificial tears. Thomas Bean continue to have stable finger numbness since chemotherapy was changed. His depression contol is good with 75 mg of Zoloft. The patient denied fever, chills, night sweats, change in appetite or weight. He denied headaches, double vision, blurry vision, nasal congestion, nasal discharge, odynophagia or dysphagia. No palpitations, dyspnea, cough, abdominal pain,  vomiting, diarrhea, hematochezia. The patient denied dysuria, nocturia, polyuria, hematuria, myalgia, tingling.  Review of Systems  Constitutional: Negative for fever, chills, weight loss, malaise/fatigue and diaphoresis.  HENT: Positive for hearing loss and nosebleeds. Negative for ear pain, congestion, sore throat, neck pain and tinnitus.   Eyes: Negative for blurred vision, double vision, photophobia and discharge.  Respiratory: Negative for cough, hemoptysis, sputum production, shortness of breath, wheezing and stridor.   Cardiovascular: Positive for chest pain. Negative for palpitations, orthopnea, claudication, leg swelling and PND.  Gastrointestinal: Positive for nausea and constipation. Negative for heartburn, vomiting, abdominal pain, diarrhea, blood in stool and melena.  Genitourinary: Negative for dysuria, urgency, frequency and hematuria.  Musculoskeletal: Negative for myalgias, back pain and joint pain.  Skin: Positive for rash. Negative for itching.  Neurological: Positive for sensory change. Negative for dizziness, tingling, speech change, focal weakness, seizures, loss of consciousness and weakness.    Endo/Heme/Allergies: Does not bruise/bleed easily.  Psychiatric/Behavioral: Positive for  depression.     PAST MEDICAL HISTORY: Past Medical History  Diagnosis Date  . Hemorrhoids   . Hypertension     no medications at present  . GERD (gastroesophageal reflux disease)     no medication  . Depression     treated approx 12 years ago, no treatment at this time  . Hiatal hernia   . Hx of radiation therapy 11/15/11 -01/05/12    left lateral tongue  . Lesion of left lung 03/14/12    CT chest, [facility], squamous cell carcinoma  . Facial rash 05/24/2012  . History of radiation therapy 07/24/12-07/26/12    left iliac bone, 18Gy/3 sessions  . Tongue cancer 12/2010    left side of tongue T1 lesion excised 4/12  . Lung metastasis dx'd 03/2012  . Bone metastasis 04/25/12    PET scan-left iliac bone    PAST SURGICAL HISTORY: Past Surgical History  Procedure Laterality Date  . Inguinal herniorrhapy    . Tonsillectomy and adenoidectomy    . Excision of tongue mass      April 2012  . Hernia repair    . Neck mass excision  07/27/2011  . Radical neck dissection  07/27/2011    Procedure: RADICAL NECK DISSECTION;  Surgeon: Drema Halon, MD;  Location: [facility] OR;  Service: ENT;  Laterality: Left;  Marland Kitchen Mandibulectomy  09/2011    and scapular free flap  . Modified radical neck dissection  03/27/12    excision of lip lesion, lip benign, right neck squamous cell ca    FAMILY HISTORY Family History  Problem Relation Age of Onset  . Adopted: Yes  . Other      biological parents may have had cerebral aneurysm   . Vision loss Paternal Aunt     HEALTH MAINTENANCE: History  Substance Use Topics  . Smoking status: Former Smoker -- 0.25 packs/day for 20 years    Types: Cigarettes    Quit date: 07/13/2011  . Smokeless tobacco: Never Used  . Alcohol Use: Yes     Comment: approx. 2 beers or wine per week    Allergies  Allergen Reactions  . Codeine Phosphate     REACTION: rash, swelling  .  Morphine And Related   . Tape     Blisters; please use paper tape    Current Outpatient Prescriptions  Medication Sig Dispense Refill  . clindamycin (CLINDAGEL) 1 % gel Apply topically as needed. Apply to facial and chest skin rash.  30 g  3  . clobetasol cream (TEMOVATE) 0.05 % Apply topically as needed. Apply to rash BID      . fentaNYL (DURAGESIC - DOSED MCG/HR) 25 MCG/HR patch Place 1 patch (25 mcg total) onto the skin every 3 (three) days.  10 patch  0  . gabapentin (NEURONTIN) 300 MG capsule Take 1 capsule (300 mg total) by mouth 3 (three) times daily.  90 capsule  3  . HYDROcodone-acetaminophen (NORCO/VICODIN) 5-325 MG per tablet Take 1 tablet by mouth every 6 (six) hours as needed for pain.  90 tablet  0  . hydrocortisone cream 0.5 % Apply topically 2 (two) times daily as needed. Apply to axilla rash twice daily as needed.      . lidocaine-prilocaine (EMLA) cream Apply topically as needed. Apply to [facility] site one hour prior to needle stick to numb skin.  30 g  3  . LORazepam (ATIVAN) 0.5 MG tablet DISSOLVE 1 TABLET UNDER THE TONGUE EVERY 6 HOURS AS NEEDED FOR  ANXIETY OR FOR NAUSEA AND VOMITING  30 tablet  0  . Nutritional Supplements (FEEDING SUPPLEMENT, OSMOLITE 1.5 CAL,) LIQD Begin Osmolite 1.5 at 60 ml/hr for 12 hours nocturnal continuous feeding daily. Flush with 60 ml free water before and after continuous feedings. Continue 1 can of Osmolite 1.5 via gravity feeding TID with 60 ml free water before and after each bolus feeding. Please send pump to patient.  1422 mL  0  . omeprazole (PRILOSEC) 10 MG capsule Take 10 mg by mouth daily.      . ondansetron (ZOFRAN) 8 MG tablet Take 8 mg by mouth every 12 (twelve) hours as needed.      . prochlorperazine (COMPAZINE) 10 MG tablet Take 1 tablet (10 mg total) by mouth every 6 (six) hours as needed.  60 tablet  1  . sertraline (ZOLOFT) 50 MG tablet Take 75 mg by mouth daily.       . sodium fluoride (PREVIDENT 5000 PLUS) 1.1 % CREA dental  cream Apply thin ribbon of cream to tooth brush. Brush teeth for 2 minutes. Spit out excess-DO NOT swallow. DO NOT rinse afterwards. Repeat nightly.  1 Tube  prn   No current facility-administered medications for this visit.    OBJECTIVE: Filed Vitals:   05/14/13 0814  BP: 133/70  Pulse: 82  Temp: 98.4 F (36.9 C)  Resp: 19     Body mass index is 21.38 kg/(m^2).    ECOG FS:0  PHYSICAL EXAMINATION:  HEENT: Sclerae anicteric.  Conjunctivae were pink. Pupils round and reactive bilaterally. Oral mucosa is moist without ulceration or thrush. No occipital, submandibular, cervical, supraclavicular or axillar adenopathy. Lungs: clear to auscultation without wheezes. No rales or rhonchi. Heart: regular rate and rhythm. No murmur, gallop or rubs. Abdomen: soft, non tender. No guarding or rebound tenderness. Bowel sounds are present. No palpable hepatosplenomegaly. MSK: no focal spinal tenderness. Extremities: No clubbing or cyanosis.No calf tenderness to palpitation, no peripheral edema. The patient had grossly intact strength in upper and lower extremities. Skin exam was without ecchymosis, petechiae. Neuro: non-focal, alert and oriented to time, person and place, appropriate affect  LAB RESULTS:  CMP     Component Value Date/Time   NA 141 05/14/2013 0802   NA 138 06/25/2012 0858   K 3.8 05/14/2013 0802   K 3.1* 06/25/2012 0858   CL 102 02/25/2013 0753   CL 99 06/25/2012 0858   CO2 27 05/14/2013 0802   CO2 30 06/25/2012 0858   GLUCOSE 68* 05/14/2013 0802   GLUCOSE 86 02/25/2013 0753   GLUCOSE 88 06/25/2012 0858   BUN 8.9 05/14/2013 0802   BUN 10 06/25/2012 0858   CREATININE 0.6* 05/14/2013 0802   CREATININE 0.47* 06/25/2012 0858   CALCIUM 9.3 05/14/2013 0802   CALCIUM 9.6 06/25/2012 0858   PROT 7.3 02/25/2013 0753   PROT 6.8 05/03/2012 0905   ALBUMIN 3.7 02/25/2013 0753   ALBUMIN 3.8 05/03/2012 0905   AST 19 02/25/2013 0753   AST 14 05/03/2012 0905   ALT 15 02/25/2013 0753   ALT 11 05/03/2012  0905   ALKPHOS 52 02/25/2013 0753   ALKPHOS 54 05/03/2012 0905   BILITOT 0.26 02/25/2013 0753   BILITOT 0.3 05/03/2012 0905   GFRNONAA >90 12/29/2011 0350   GFRAA >90 12/29/2011 0350    Lab Results  Component Value Date   WBC 5.6 05/14/2013   NEUTROABS 3.9 05/14/2013   HGB 10.4* 05/14/2013   HCT 32.2* 05/14/2013   MCV 97.6 05/14/2013   PLT  228 05/14/2013      Chemistry      Component Value Date/Time   NA 141 05/14/2013 0802   NA 138 06/25/2012 0858   K 3.8 05/14/2013 0802   K 3.1* 06/25/2012 0858   CL 102 02/25/2013 0753   CL 99 06/25/2012 0858   CO2 27 05/14/2013 0802   CO2 30 06/25/2012 0858   BUN 8.9 05/14/2013 0802   BUN 10 06/25/2012 0858   CREATININE 0.6* 05/14/2013 0802   CREATININE 0.47* 06/25/2012 0858      Component Value Date/Time   CALCIUM 9.3 05/14/2013 0802   CALCIUM 9.6 06/25/2012 0858   ALKPHOS 52 02/25/2013 0753   ALKPHOS 54 05/03/2012 0905   AST 19 02/25/2013 0753   AST 14 05/03/2012 0905   ALT 15 02/25/2013 0753   ALT 11 05/03/2012 0905   BILITOT 0.26 02/25/2013 0753   BILITOT 0.3 05/03/2012 0905      STUDIES: No results found.  ASSESSMENT AND PLAN: 1. Recurrent left lateral tongue squamous cell carcinoma: He now has metastatic disease to the lung and bone.  -His restaging CT from 02/18/13 showed enlargement of the left cavitary lesion. There was no convincing evidence of disease progression anywhere else.  - He was evaluated at [facility] to see if he is a candidate for any clinical trial.  - We discussed with patient and his son change his treatment with Cetuximab to 150 mg/m2 weekly and patient agrees to try. - We will proceed with another treatment Carboplatin/Taxol/Erbitux today.  2. Positional chest pain at surgical site.We will order CT scan of neck, chest, abdomen and pelvis..Continue Neurontin and Fentanyl patch. 3. Skin rash secondary to Erbitux. He is on Cleocin cream and Hydrocortisone cream prn.  4. History of jaw bone pain due to neuralgia: he is on Neurontin with  complite response.  5. Bone met: limited. S/p palliative XRT. He has been on Zometa every 6 weeks. Zometa today.  6. Anemia, gade 1 : This is from chemo. There is no active bleeding. No transfusion indicated.  7. Smoking: none at this time.  8. Follow up: in 1 week     Myra Rude, MD   05/15/2013 5:24 AM

## 2013-05-16 ENCOUNTER — Other Ambulatory Visit: Payer: Self-pay | Admitting: Hematology and Oncology

## 2013-05-16 ENCOUNTER — Ambulatory Visit (HOSPITAL_COMMUNITY)
Admission: RE | Admit: 2013-05-16 | Discharge: 2013-05-16 | Disposition: A | Discharge status: Home / Self Care | Payer: Medicaid Other | Source: Ambulatory Visit | Attending: Hematology and Oncology | Admitting: Hematology and Oncology

## 2013-05-16 DIAGNOSIS — Z923 Personal history of irradiation: Secondary | ICD-10-CM | POA: Insufficient documentation

## 2013-05-16 DIAGNOSIS — Z79899 Other long term (current) drug therapy: Secondary | ICD-10-CM | POA: Insufficient documentation

## 2013-05-16 DIAGNOSIS — C7951 Secondary malignant neoplasm of bone: Secondary | ICD-10-CM | POA: Insufficient documentation

## 2013-05-16 DIAGNOSIS — D739 Disease of spleen, unspecified: Secondary | ICD-10-CM | POA: Insufficient documentation

## 2013-05-16 DIAGNOSIS — N281 Cyst of kidney, acquired: Secondary | ICD-10-CM | POA: Insufficient documentation

## 2013-05-16 DIAGNOSIS — I700 Atherosclerosis of aorta: Secondary | ICD-10-CM | POA: Insufficient documentation

## 2013-05-16 DIAGNOSIS — J984 Other disorders of lung: Secondary | ICD-10-CM | POA: Insufficient documentation

## 2013-05-16 DIAGNOSIS — Z931 Gastrostomy status: Secondary | ICD-10-CM | POA: Insufficient documentation

## 2013-05-16 DIAGNOSIS — C029 Malignant neoplasm of tongue, unspecified: Secondary | ICD-10-CM | POA: Insufficient documentation

## 2013-05-16 MED ORDER — SERTRALINE HCL 50 MG PO TABS: 75.0000 mg | ORAL_TABLET | Freq: Every day | ORAL | Status: DC

## 2013-05-16 MED ORDER — IOHEXOL 300 MG/ML  SOLN
100.0000 mL | Freq: Once | INTRAMUSCULAR | Status: AC | PRN
  Administered 2013-05-16: 100 mL via INTRAVENOUS

## 2013-05-16 MED ORDER — LORAZEPAM 0.5 MG PO TABS: ORAL_TABLET | ORAL | Status: DC

## 2013-05-16 NOTE — Telephone Encounter (Signed)
Ativan and Sertraline refills called into [facility].  Notified pt of refills called in.  He verbalized understanding.

## 2013-05-20 ENCOUNTER — Telehealth: Payer: Self-pay | Admitting: Internal Medicine

## 2013-05-20 ENCOUNTER — Other Ambulatory Visit: Payer: Self-pay | Admitting: Lab

## 2013-05-20 ENCOUNTER — Other Ambulatory Visit: Payer: Self-pay | Admitting: Certified Registered Nurse Anesthetist

## 2013-05-20 ENCOUNTER — Ambulatory Visit: Payer: Self-pay

## 2013-05-20 ENCOUNTER — Ambulatory Visit (HOSPITAL_BASED_OUTPATIENT_CLINIC_OR_DEPARTMENT_OTHER): Payer: Self-pay | Admitting: Internal Medicine

## 2013-05-20 ENCOUNTER — Other Ambulatory Visit (HOSPITAL_BASED_OUTPATIENT_CLINIC_OR_DEPARTMENT_OTHER): Payer: Medicaid Other | Admitting: Lab

## 2013-05-20 ENCOUNTER — Ambulatory Visit (HOSPITAL_BASED_OUTPATIENT_CLINIC_OR_DEPARTMENT_OTHER): Payer: Medicaid Other

## 2013-05-20 ENCOUNTER — Encounter: Payer: Self-pay | Admitting: Internal Medicine

## 2013-05-20 VITALS — BP 118/80 | HR 100 | Temp 98.9°F | Resp 20 | Ht 72.0 in | Wt 158.2 lb

## 2013-05-20 DIAGNOSIS — C7951 Secondary malignant neoplasm of bone: Secondary | ICD-10-CM

## 2013-05-20 DIAGNOSIS — Z5111 Encounter for antineoplastic chemotherapy: Secondary | ICD-10-CM

## 2013-05-20 DIAGNOSIS — C029 Malignant neoplasm of tongue, unspecified: Secondary | ICD-10-CM

## 2013-05-20 DIAGNOSIS — C7800 Secondary malignant neoplasm of unspecified lung: Secondary | ICD-10-CM

## 2013-05-20 DIAGNOSIS — R21 Rash and other nonspecific skin eruption: Secondary | ICD-10-CM

## 2013-05-20 DIAGNOSIS — C770 Secondary and unspecified malignant neoplasm of lymph nodes of head, face and neck: Secondary | ICD-10-CM

## 2013-05-20 DIAGNOSIS — D6481 Anemia due to antineoplastic chemotherapy: Secondary | ICD-10-CM

## 2013-05-20 DIAGNOSIS — Z5112 Encounter for antineoplastic immunotherapy: Secondary | ICD-10-CM

## 2013-05-20 DIAGNOSIS — R911 Solitary pulmonary nodule: Secondary | ICD-10-CM

## 2013-05-20 LAB — CBC WITH DIFFERENTIAL/PLATELET
EOS%: 3.4 % (ref 0.0–7.0)
Eosinophils Absolute: 0.2 10*3/uL (ref 0.0–0.5)
LYMPH%: 11.5 % — ABNORMAL LOW (ref 14.0–49.0)
MCH: 31.3 pg (ref 27.2–33.4)
MCV: 97.3 fL (ref 79.3–98.0)
MONO%: 4.4 % (ref 0.0–14.0)
NEUT#: 4 10*3/uL (ref 1.5–6.5)
Platelets: 234 10*3/uL (ref 140–400)
RBC: 3.39 10*6/uL — ABNORMAL LOW (ref 4.20–5.82)
nRBC: 0 % (ref 0–0)

## 2013-05-20 LAB — COMPREHENSIVE METABOLIC PANEL (CC13)
Albumin: 3.5 g/dL (ref 3.5–5.0)
CO2: 26 mEq/L (ref 22–29)
Chloride: 103 mEq/L (ref 98–109)
Glucose: 108 mg/dl (ref 70–140)
Potassium: 4 mEq/L (ref 3.5–5.1)
Sodium: 139 mEq/L (ref 136–145)
Total Protein: 7.6 g/dL (ref 6.4–8.3)

## 2013-05-20 MED ORDER — SODIUM CHLORIDE 0.9 % IV SOLN
Freq: Once | INTRAVENOUS | Status: AC
  Administered 2013-05-20: 09:00:00 via INTRAVENOUS

## 2013-05-20 MED ORDER — DIPHENHYDRAMINE HCL 50 MG/ML IJ SOLN
INTRAMUSCULAR | Status: AC
  Filled 2013-05-20: qty 1

## 2013-05-20 MED ORDER — DEXAMETHASONE SODIUM PHOSPHATE 20 MG/5ML IJ SOLN
20.0000 mg | Freq: Once | INTRAMUSCULAR | Status: AC
  Administered 2013-05-20: 20 mg via INTRAVENOUS

## 2013-05-20 MED ORDER — DEXAMETHASONE SODIUM PHOSPHATE 20 MG/5ML IJ SOLN
INTRAMUSCULAR | Status: AC
  Filled 2013-05-20: qty 5

## 2013-05-20 MED ORDER — PACLITAXEL CHEMO INJECTION 300 MG/50ML
48.0000 mg/m2 | Freq: Once | INTRAVENOUS | Status: AC
  Administered 2013-05-20: 90 mg via INTRAVENOUS
  Filled 2013-05-20: qty 15

## 2013-05-20 MED ORDER — HEPARIN SOD (PORK) LOCK FLUSH 100 UNIT/ML IV SOLN
500.0000 [IU] | Freq: Once | INTRAVENOUS | Status: AC | PRN
  Administered 2013-05-20: 500 [IU]
  Filled 2013-05-20: qty 5

## 2013-05-20 MED ORDER — ONDANSETRON 16 MG/50ML IVPB (CHCC)
INTRAVENOUS | Status: AC
  Filled 2013-05-20: qty 16

## 2013-05-20 MED ORDER — CETUXIMAB CHEMO IV INJECTION 200 MG/100ML
150.0000 mg/m2 | Freq: Once | INTRAVENOUS | Status: AC
  Administered 2013-05-20: 300 mg via INTRAVENOUS
  Filled 2013-05-20: qty 150

## 2013-05-20 MED ORDER — SODIUM CHLORIDE 0.9 % IJ SOLN
10.0000 mL | INTRAMUSCULAR | Status: DC | PRN
  Administered 2013-05-20: 10 mL
  Filled 2013-05-20: qty 10

## 2013-05-20 MED ORDER — DIPHENHYDRAMINE HCL 50 MG/ML IJ SOLN
50.0000 mg | Freq: Once | INTRAMUSCULAR | Status: AC
  Administered 2013-05-20: 50 mg via INTRAVENOUS

## 2013-05-20 MED ORDER — CARBOPLATIN CHEMO INTRADERMAL TEST DOSE 100MCG/0.02ML
100.0000 ug | Freq: Once | INTRADERMAL | Status: AC
  Administered 2013-05-20: 100 ug via INTRADERMAL
  Filled 2013-05-20: qty 0.01

## 2013-05-20 MED ORDER — FAMOTIDINE IN NACL 20-0.9 MG/50ML-% IV SOLN
INTRAVENOUS | Status: AC
  Administered 2013-05-20: 20 mg
  Filled 2013-05-20: qty 50

## 2013-05-20 MED ORDER — FAMOTIDINE IN NACL 20-0.9 MG/50ML-% IV SOLN
20.0000 mg | Freq: Once | INTRAVENOUS | Status: AC
  Administered 2013-05-20: 20 mg via INTRAVENOUS

## 2013-05-20 MED ORDER — ONDANSETRON 16 MG/50ML IVPB (CHCC)
16.0000 mg | Freq: Once | INTRAVENOUS | Status: AC
  Administered 2013-05-20: 16 mg via INTRAVENOUS

## 2013-05-20 MED ORDER — SODIUM CHLORIDE 0.9 % IV SOLN
180.0000 mg | Freq: Once | INTRAVENOUS | Status: AC
  Administered 2013-05-20: 180 mg via INTRAVENOUS
  Filled 2013-05-20: qty 18

## 2013-05-20 NOTE — Patient Instructions (Signed)
Cetuximab injection What is this medicine? CETUXIMAB (se TUX i mab) is a chemotherapy drug. It targets a specific protein within cancer cells and stops the cells from growing. It is used to treat colorectal cancer and head and neck cancer. This medicine may be used for other purposes; ask your health care provider or pharmacist if you have questions. What should I tell my health care provider before I take this medicine? They need to know if you have any of these conditions: -heart disease -history of irregular heartbeat -history of low levels of calcium, magnesium, or potassium in the blood -lung or breathing disease, like asthma -an unusual or allergic reaction to cetuximab, other medicines, foods, dyes, or preservatives -pregnant or trying to get pregnant -breast-feeding How should I use this medicine? This drug is given as an infusion into a vein. It is administered in a hospital or clinic by a specially trained health care professional. Talk to your pediatrician regarding the use of this medicine in children. Special care may be needed. Overdosage: If you think you have taken too much of this medicine contact a poison control center or emergency room at once. NOTE: This medicine is only for you. Do not share this medicine with others. What if I miss a dose? It is important not to miss your dose. Call your doctor or health care professional if you are unable to keep an appointment. What may interact with this medicine? Interactions are not expected. This list may not describe all possible interactions. Give your health care provider a list of all the medicines, herbs, non-prescription drugs, or dietary supplements you use. Also tell them if you smoke, drink alcohol, or use illegal drugs. Some items may interact with your medicine. What should I watch for while using this medicine? Visit your doctor or health care professional for regular checks on your progress. This drug may make you  feel generally unwell. This is not uncommon, as chemotherapy can affect healthy cells as well as cancer cells. Report any side effects. Continue your course of treatment even though you feel ill unless your doctor tells you to stop. This medicine can make you more sensitive to the sun. Keep out of the sun while taking this medicine and for 2 months after the last dose. If you cannot avoid being in the sun, wear protective clothing and use sunscreen. Do not use sun lamps or tanning beds/booths. You may need blood work done while you are taking this medicine. In some cases, you may be given additional medicines to help with side effects. Follow all directions for their use. Call your doctor or health care professional for advice if you get a fever, chills or sore throat, or other symptoms of a cold or flu. Do not treat yourself. This drug decreases your body's ability to fight infections. Try to avoid being around people who are sick. Avoid taking products that contain aspirin, acetaminophen, ibuprofen, naproxen, or ketoprofen unless instructed by your doctor. These medicines may hide a fever. Do not become pregnant while taking this medicine. Women should inform their doctor if they wish to become pregnant or think they might be pregnant. There is a potential for serious side effects to an unborn child. Use adequate birth control methods. Avoid pregnancy for at least 6 months after your last dose. Talk to your health care professional or pharmacist for more information. Do not breast-feed an infant while taking this medicine or during the 2 months after your last dose. What side effects  may I notice from receiving this medicine? Side effects that you should report to your doctor or health care professional as soon as possible: -allergic reactions like skin rash, itching or hives, swelling of the face, lips, or tongue -breathing problems -changes in vision -fast, irregular heartbeat -feeling faint or  lightheaded, falls -fever, chills -mouth sores -trouble passing urine or change in the amount of urine -unusually weak or tired Side effects that usually do not require medical attention (report to your doctor or health care professional if they continue or are bothersome): -changes in skin like acne, cracks, skin dryness -constipation -diarrhea -headache -nail changes -nausea, vomiting -stomach upset -weight loss This list may not describe all possible side effects. Call your doctor for medical advice about side effects. You may report side effects to FDA at 1-800-FDA-1088. Where should I keep my medicine? This drug is given in a hospital or clinic and will not be stored at home. NOTE: This sheet is a summary. It may not cover all possible information. If you have questions about this medicine, talk to your doctor, pharmacist, or health care provider.  2013, [employer]. (07/20/2010 2:01:41 PM) Paclitaxel injection What is this medicine? PACLITAXEL (PAK li TAX el) is a chemotherapy drug. It targets fast dividing cells, like cancer cells, and causes these cells to die. This medicine is used to treat ovarian cancer, breast cancer, and other cancers. This medicine may be used for other purposes; ask your health care provider or pharmacist if you have questions. What should I tell my health care provider before I take this medicine? They need to know if you have any of these conditions: -blood disorders -irregular heartbeat -infection (especially a virus infection such as chickenpox, cold sores, or herpes) -liver disease -previous or ongoing radiation therapy -an unusual or allergic reaction to paclitaxel, alcohol, polyoxyethylated castor oil, other chemotherapy agents, other medicines, foods, dyes, or preservatives -pregnant or trying to get pregnant -breast-feeding How should I use this medicine? This drug is given as an infusion into a vein. It is administered in a  hospital or clinic by a specially trained health care professional. Talk to your pediatrician regarding the use of this medicine in children. Special care may be needed. Overdosage: If you think you have taken too much of this medicine contact a poison control center or emergency room at once. NOTE: This medicine is only for you. Do not share this medicine with others. What if I miss a dose? It is important not to miss your dose. Call your doctor or health care professional if you are unable to keep an appointment. What may interact with this medicine? Do not take this medicine with any of the following medications: -disulfiram -metronidazole This medicine may also interact with the following medications: -cyclosporine -dexamethasone -diazepam -ketoconazole -medicines to increase blood counts like filgrastim, pegfilgrastim, sargramostim -other chemotherapy drugs like cisplatin, doxorubicin, epirubicin, etoposide, teniposide, vincristine -quinidine -testosterone -vaccines -verapamil Talk to your doctor or health care professional before taking any of these medicines: -acetaminophen -aspirin -ibuprofen -ketoprofen -naproxen This list may not describe all possible interactions. Give your health care provider a list of all the medicines, herbs, non-prescription drugs, or dietary supplements you use. Also tell them if you smoke, drink alcohol, or use illegal drugs. Some items may interact with your medicine. What should I watch for while using this medicine? Your condition will be monitored carefully while you are receiving this medicine. You will need important blood work done while you are taking this  medicine. This drug may make you feel generally unwell. This is not uncommon, as chemotherapy can affect healthy cells as well as cancer cells. Report any side effects. Continue your course of treatment even though you feel ill unless your doctor tells you to stop. In some cases, you may be  given additional medicines to help with side effects. Follow all directions for their use. Call your doctor or health care professional for advice if you get a fever, chills or sore throat, or other symptoms of a cold or flu. Do not treat yourself. This drug decreases your body's ability to fight infections. Try to avoid being around people who are sick. This medicine may increase your risk to bruise or bleed. Call your doctor or health care professional if you notice any unusual bleeding. Be careful brushing and flossing your teeth or using a toothpick because you may get an infection or bleed more easily. If you have any dental work done, tell your dentist you are receiving this medicine. Avoid taking products that contain aspirin, acetaminophen, ibuprofen, naproxen, or ketoprofen unless instructed by your doctor. These medicines may hide a fever. Do not become pregnant while taking this medicine. Women should inform their doctor if they wish to become pregnant or think they might be pregnant. There is a potential for serious side effects to an unborn child. Talk to your health care professional or pharmacist for more information. Do not breast-feed an infant while taking this medicine. Men are advised not to father a child while receiving this medicine. What side effects may I notice from receiving this medicine? Side effects that you should report to your doctor or health care professional as soon as possible: -allergic reactions like skin rash, itching or hives, swelling of the face, lips, or tongue -low blood counts - This drug may decrease the number of white blood cells, red blood cells and platelets. You may be at increased risk for infections and bleeding. -signs of infection - fever or chills, cough, sore throat, pain or difficulty passing urine -signs of decreased platelets or bleeding - bruising, pinpoint red spots on the skin, black, tarry stools, nosebleeds -signs of decreased red blood  cells - unusually weak or tired, fainting spells, lightheadedness -breathing problems -chest pain -high or low blood pressure -mouth sores -nausea and vomiting -pain, swelling, redness or irritation at the injection site -pain, tingling, numbness in the hands or feet -slow or irregular heartbeat -swelling of the ankle, feet, hands Side effects that usually do not require medical attention (report to your doctor or health care professional if they continue or are bothersome): -bone pain -complete hair loss including hair on your head, underarms, pubic hair, eyebrows, and eyelashes -changes in the color of fingernails -diarrhea -loosening of the fingernails -loss of appetite -muscle or joint pain -red flush to skin -sweating This list may not describe all possible side effects. Call your doctor for medical advice about side effects. You may report side effects to FDA at 1-800-FDA-1088. Where should I keep my medicine? This drug is given in a hospital or clinic and will not be stored at home. NOTE: This sheet is a summary. It may not cover all possible information. If you have questions about this medicine, talk to your doctor, pharmacist, or health care provider.  2013, [employer]. (08/11/2008 11:54:26 AM) Carboplatin injection What is this medicine? CARBOPLATIN (KAR boe pla tin) is a chemotherapy drug. It targets fast dividing cells, like cancer cells, and causes these cells to die.  This medicine is used to treat ovarian cancer and many other cancers. This medicine may be used for other purposes; ask your health care provider or pharmacist if you have questions. What should I tell my health care provider before I take this medicine? They need to know if you have any of these conditions: -blood disorders -hearing problems -kidney disease -recent or ongoing radiation therapy -an unusual or allergic reaction to carboplatin, cisplatin, other chemotherapy, other medicines,  foods, dyes, or preservatives -pregnant or trying to get pregnant -breast-feeding How should I use this medicine? This drug is usually given as an infusion into a vein. It is administered in a hospital or clinic by a specially trained health care professional. Talk to your pediatrician regarding the use of this medicine in children. Special care may be needed. Overdosage: If you think you have taken too much of this medicine contact a poison control center or emergency room at once. NOTE: This medicine is only for you. Do not share this medicine with others. What if I miss a dose? It is important not to miss a dose. Call your doctor or health care professional if you are unable to keep an appointment. What may interact with this medicine? -medicines for seizures -medicines to increase blood counts like filgrastim, pegfilgrastim, sargramostim -some antibiotics like amikacin, gentamicin, neomycin, streptomycin, tobramycin -vaccines Talk to your doctor or health care professional before taking any of these medicines: -acetaminophen -aspirin -ibuprofen -ketoprofen -naproxen This list may not describe all possible interactions. Give your health care provider a list of all the medicines, herbs, non-prescription drugs, or dietary supplements you use. Also tell them if you smoke, drink alcohol, or use illegal drugs. Some items may interact with your medicine. What should I watch for while using this medicine? Your condition will be monitored carefully while you are receiving this medicine. You will need important blood work done while you are taking this medicine. This drug may make you feel generally unwell. This is not uncommon, as chemotherapy can affect healthy cells as well as cancer cells. Report any side effects. Continue your course of treatment even though you feel ill unless your doctor tells you to stop. In some cases, you may be given additional medicines to help with side effects.  Follow all directions for their use. Call your doctor or health care professional for advice if you get a fever, chills or sore throat, or other symptoms of a cold or flu. Do not treat yourself. This drug decreases your body's ability to fight infections. Try to avoid being around people who are sick. This medicine may increase your risk to bruise or bleed. Call your doctor or health care professional if you notice any unusual bleeding. Be careful brushing and flossing your teeth or using a toothpick because you may get an infection or bleed more easily. If you have any dental work done, tell your dentist you are receiving this medicine. Avoid taking products that contain aspirin, acetaminophen, ibuprofen, naproxen, or ketoprofen unless instructed by your doctor. These medicines may hide a fever. Do not become pregnant while taking this medicine. Women should inform their doctor if they wish to become pregnant or think they might be pregnant. There is a potential for serious side effects to an unborn child. Talk to your health care professional or pharmacist for more information. Do not breast-feed an infant while taking this medicine. What side effects may I notice from receiving this medicine? Side effects that you should report to your doctor  or health care professional as soon as possible: -allergic reactions like skin rash, itching or hives, swelling of the face, lips, or tongue -signs of infection - fever or chills, cough, sore throat, pain or difficulty passing urine -signs of decreased platelets or bleeding - bruising, pinpoint red spots on the skin, black, tarry stools, nosebleeds -signs of decreased red blood cells - unusually weak or tired, fainting spells, lightheadedness -breathing problems -changes in hearing -changes in vision -chest pain -high blood pressure -low blood counts - This drug may decrease the number of white blood cells, red blood cells and platelets. You may be at  increased risk for infections and bleeding. -nausea and vomiting -pain, swelling, redness or irritation at the injection site -pain, tingling, numbness in the hands or feet -problems with balance, talking, walking -trouble passing urine or change in the amount of urine Side effects that usually do not require medical attention (report to your doctor or health care professional if they continue or are bothersome): -hair loss -loss of appetite -metallic taste in the mouth or changes in taste This list may not describe all possible side effects. Call your doctor for medical advice about side effects. You may report side effects to FDA at 1-800-FDA-1088. Where should I keep my medicine? This drug is given in a hospital or clinic and will not be stored at home. NOTE: This sheet is a summary. It may not cover all possible information. If you have questions about this medicine, talk to your doctor, pharmacist, or health care provider.  2012, [employer]. (12/04/2007 2:38:05 PM)

## 2013-05-20 NOTE — Patient Instructions (Addendum)
[facility] Discharge Instructions for Patients Receiving Chemotherapy  Today you received the following chemotherapy agents Taxol, Carboplatin and Erbitux.  To help prevent nausea and vomiting after your treatment, we encourage you to take your nausea medication as prescribed.   If you develop nausea and vomiting that is not controlled by your nausea medication, call the clinic.   BELOW ARE SYMPTOMS THAT SHOULD BE REPORTED IMMEDIATELY:  *FEVER GREATER THAN 100.5 F  *CHILLS WITH OR WITHOUT FEVER  NAUSEA AND VOMITING THAT IS NOT CONTROLLED WITH YOUR NAUSEA MEDICATION  *UNUSUAL SHORTNESS OF BREATH  *UNUSUAL BRUISING OR BLEEDING  TENDERNESS IN MOUTH AND THROAT WITH OR WITHOUT PRESENCE OF ULCERS  *URINARY PROBLEMS  *BOWEL PROBLEMS  UNUSUAL RASH Items with * indicate a potential emergency and should be followed up as soon as possible.  Feel free to call the clinic you have any questions or concerns. The clinic phone number is [phone].

## 2013-05-20 NOTE — Progress Notes (Signed)
Carboplatin skin test today was negative.

## 2013-05-20 NOTE — Progress Notes (Signed)
Hematology and Oncology Follow Up Visit  Thomas Bean [id number] December 11, 1960 52 y.o. 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 He developed biopsy proven metastatic disease in 03/2012 with met in right neck, left lung mass and left illac.   Prior Therapy:  1. 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 He developed recurrence in the left submandibular gland in August 2012. He underwent on 07/27/2011 radical left neck dissection by Dr. Ezzard Standing and Dr. Geryl Rankins. Pathology case number [id number] showed total 15 lymph nodes from left radical neck dissection that was all negative. However the left submandibular gland mass showed a 3.5 cm invasive SCC, moderately differentiates involving the adjacent skeletal muscle tissue and bone a tissue and extending into the inked margin. There was no evidence of any lymphatic invasion or perineural invasion. He underwent further resection by Dr. Mary Sella at [facility] on 09/29/2011.  2. He received adjuvant chemoradiation therapy with q 3 week Cisplatin given 11/15/11-12/05/11 (received 2 of 3 doses; 3rd dose held due to febrile neutropenia and development of flap abscess). He received concurrent XRT 11/15/11 through 01/05/12.  3. For right neck recurrence in 03/2012; he underwent palliative right neck dissection by Dr. Manson Passey at [facility].   Current therapy:  Started on 05/15/2012 palliative chemo weekly Carboplatin/Taxol/Erbitux; 3 weeks on; 1 week off. Due to prolonged cytopenia, his chemo has been changed to weekly; 2 weeks on, 1 week off.   Interim History:  Thomas Bean 52 y.o. male returns for regular follow up visit. He was seen last week on 05/15/2013 by Dr. Roanna Raider. He reported no major changes since his last visit. He continues to have rash on face, scalp, chest and  back relieved with topical. His appetite is good. Patient still complains on positional pain in left side of his chest at site of surgery but noticed some improvement with neurontin. Similar pain is noted near his left jaw were his surgery occurred. Patient restarted Fentanyl patch. His hearing diminished after treatment with cisplatin. Occasionally he has nausea.  Patient handle his constipation with MiraLax usually requiring a weekly dose.  The patient denied fever, chills, night sweats, change in appetite or weight. He denied headaches, double vision, blurry vision, nasal congestion, nasal discharge, odynophagia or dysphagia. No palpitations, dyspnea, cough, abdominal pain,  vomiting, diarrhea, hematochezia. The patient denied dysuria, nocturia, polyuria, hematuria, myalgia, tingling.  Medications: I have reviewed the patient's current medications.  Current Outpatient Prescriptions  Medication Sig Dispense Refill  . clindamycin (CLINDAGEL) 1 % gel Apply topically as needed. Apply to facial and chest skin rash.  30 g  3  . clobetasol cream (TEMOVATE) 0.05 % Apply topically as needed. Apply to rash BID      . fentaNYL (DURAGESIC - DOSED MCG/HR) 25 MCG/HR patch Place 1 patch (25 mcg total) onto the skin every 3 (three) days.  10 patch  0  . gabapentin (NEURONTIN) 300 MG capsule Take 1 capsule (300 mg total) by mouth 3 (three) times daily.  90 capsule  3  . HYDROcodone-acetaminophen (NORCO/VICODIN) 5-325 MG per tablet Take 1 tablet by mouth every 6 (six) hours as needed for pain.  90 tablet  0  . hydrocortisone cream 0.5 % Apply topically 2 (two) times daily as needed. Apply to axilla rash twice daily as needed.      . lidocaine-prilocaine (EMLA) cream  Apply topically as needed. Apply to [facility] site one hour prior to needle stick to numb skin.  30 g  3  . LORazepam (ATIVAN) 0.5 MG tablet DISSOLVE 1 TABLET UNDER THE TONGUE EVERY 6 HOURS AS NEEDED FOR ANXIETY OR FOR NAUSEA AND VOMITING  60 tablet  0  .  Nutritional Supplements (FEEDING SUPPLEMENT, OSMOLITE 1.5 CAL,) LIQD Begin Osmolite 1.5 at 60 ml/hr for 12 hours nocturnal continuous feeding daily. Flush with 60 ml free water before and after continuous feedings. Continue 1 can of Osmolite 1.5 via gravity feeding TID with 60 ml free water before and after each bolus feeding. Please send pump to patient.  1422 mL  0  . omeprazole (PRILOSEC) 10 MG capsule Take 10 mg by mouth daily.      . ondansetron (ZOFRAN) 8 MG tablet Take 8 mg by mouth every 12 (twelve) hours as needed.      . prochlorperazine (COMPAZINE) 10 MG tablet Take 1 tablet (10 mg total) by mouth every 6 (six) hours as needed.  60 tablet  1  . sertraline (ZOLOFT) 50 MG tablet Take 1.5 tablets (75 mg total) by mouth daily.  60 tablet  0  . sodium fluoride (PREVIDENT 5000 PLUS) 1.1 % CREA dental cream Apply thin ribbon of cream to tooth brush. Brush teeth for 2 minutes. Spit out excess-DO NOT swallow. DO NOT rinse afterwards. Repeat nightly.  1 Tube  prn   No current facility-administered medications for this visit.   Allergies:  Allergies  Allergen Reactions  . Codeine Phosphate     REACTION: rash, swelling  . Morphine And Related   . Tape     Blisters; please use paper tape    Past Medical History, Surgical history, Social history, and Family History were reviewed and updated.  Review of Systems: Constitutional:  Negative for fever, chills, night sweats, anorexia, weight loss, pain. Cardiovascular: no chest pain or dyspnea on exertion Respiratory: no cough, shortness of breath, or wheezing Neurological: no TIA or stroke symptoms Dermatological: positive for rash negative for lumps ENT: negative for - oral lesions Skin: Negative. Gastrointestinal: no abdominal pain, change in bowel habits, or black or bloody stools positive for - G tube Genito-Urinary: no dysuria, trouble voiding, or hematuria Hematological and Lymphatic: negative for - bleeding problems Musculoskeletal:  negative for - gait disturbance Remaining ROS negative. Physical Exam: Blood pressure 118/80, pulse 100, temperature 98.9 F (37.2 C), temperature source Oral, resp. rate 20, height 6' (1.829 m), weight 158 lb 3.2 oz (71.759 kg). ECOG: 0 General appearance: alert, cooperative, appears stated age and no distress Head: Normocephalic, without obvious abnormality, atraumatic HEENT: Sclerae anicteric. Conjuncitvae pink. PERRLA; Oral mucosa moist without ulceration or thrush.  Neck: no adenopathy, supple, symmetrical, trachea midline and thyroid not enlarged, symmetric, no tenderness/mass/nodules. No sumbandibular adenopathy.  Lymph nodes: Cervical adenopathy: None appreciated and Supraclavicular adenopathy: None appreciated; No  Heart:regular rate and rhythm, S1, S2 normal, no murmur, click, rub or gallop Lung:chest clear, no wheezing, rales, normal symmetric air entry, Heart exam - S1, S2 normal, no murmur, no gallop, rate regular Abdomin: soft, non-tender, without masses or organomegaly + G tube without signs of infections.  EXT: No peripheral edema Skin: Rash on face, back, chest with erythema.   Lab Results: Lab Results  Component Value Date   WBC 5.0 05/20/2013   HGB 10.6* 05/20/2013   HCT 33.0* 05/20/2013   MCV 97.3 05/20/2013   PLT 234 05/20/2013     Chemistry  Component Value Date/Time   NA 141 05/14/2013 0802   NA 138 06/25/2012 0858   K 3.8 05/14/2013 0802   K 3.1* 06/25/2012 0858   CL 102 02/25/2013 0753   CL 99 06/25/2012 0858   CO2 27 05/14/2013 0802   CO2 30 06/25/2012 0858   BUN 8.9 05/14/2013 0802   BUN 10 06/25/2012 0858   CREATININE 0.6* 05/14/2013 0802   CREATININE 0.47* 06/25/2012 0858      Component Value Date/Time   CALCIUM 9.3 05/14/2013 0802   CALCIUM 9.6 06/25/2012 0858   ALKPHOS 52 02/25/2013 0753   ALKPHOS 54 05/03/2012 0905   AST 19 02/25/2013 0753   AST 14 05/03/2012 0905   ALT 15 02/25/2013 0753   ALT 11 05/03/2012 0905   BILITOT 0.26 02/25/2013 0753   BILITOT 0.3  05/03/2012 4098      Radiological Studies: CT CHEST, ABDOMEN AND PELVIS WITH CONTRAST (05/16/2013) Technique: Multidetector CT imaging of the chest, abdomen and pelvis was performed following the standard protocol during bolus administration of intravenous contrast.  Contrast: OMNIPAQUE IOHEXOL 300 MG/ML SOLN  Comparison: 03/01/2013  CT CHEST  Findings: 4.3 x 3.2 cm cavitary lesion in the superior segment left lower lobe (series 5/image 24), previously 3.5 x 2.6 cm.  Increasing fluid within the lesion.  Moderate paraseptal emphysematous changes in the upper lobes.  No new/suspicious pulmonary nodules. No pleural effusion or pneumothorax.  Postprocedural changes in the lower left neck.  The visualized thyroid is unremarkable.  The heart is top normal in size. No pericardial effusion.  Left chest port.  No suspicious mediastinal, hilar, or axillary lymphadenopathy.  Surgical clips in the left axilla.  Mild degenerative changes of the thoracic spine.  IMPRESSION:  4.3 x 3.2 cm cavitary lesion/metastasis in the superior segment left lower lobe, mildly increased.  Moderate paraseptal emphysematous changes.  CT ABDOMEN AND PELVIS  Findings: Gastrostomy tube in satisfactory position.  Liver, pancreas, and adrenal glands are within normal limits.  5.9 x 4.1 cm rim calcified, multiloculated lesion in the medial spleen (series 2/image 63), unchanged.  Suspected cholelithiasis (series 2/image 75), without associated inflammatory changes.  1.5 x 1.2 cm medial right upper pole renal cyst (series 2/image  71). Left kidney is within normal limits. No hydronephrosis.  No evidence of bowel obstruction. Normal appendix.  Atherosclerotic calcifications of the abdominal aorta and branch  vessels.  No abdominopelvic ascites.  No suspicious abdominopelvic lymphadenopathy.  Prostate is at the upper limits of normal for size.  Bladder is within normal limits.  Degenerative changes of the lumbar  spine. Known metastasis in the  left iliac bone is not evident on CT.  IMPRESSION:  Known metastasis in the left iliac bone is not evident on CT.  No evidence of new/progressive metastatic disease in the abdomen/pelvis.  Additional stable ancillary findings as above.  Impression and Plan: ASSESSMENT AND PLAN: 1. Recurrent left lateral tongue squamous cell carcinoma: He now has metastatic disease to the lung and bone.  -His restaging CT from 02/18/13 showed enlargement of the left cavitary lesion. There was no convincing evidence of disease progression anywhere else.  - He was evaluated at [facility] to see if he is a candidate for any clinical trial.  - We discussed with patient and his son change his treatment with Cetuximab to 150 mg/m2 weekly and patient agrees to try. - We will proceed with another treatment Carboplatin/Taxol/Erbitux today.  Handout detailing chemotherapy provided.  Patient instructed to watch for fever greater than  100.5 and call MD on call or report to the ER.  2. Positional chest pain at surgical site. Reviewed CT scan of neck, chest, abdomen and pelvis obtained on 09/04. Mild progression of 4.3 x 3.2 cm cavitary lesion/metastasis in the superior segment left lower lobe representing stable disease.      Marland Kitchen.Increased Neurontin to 300 mg in morning, 300 mg in afternoon, and 900 mg q hs.  Counseled on common side-effects include sedation and effects on kidney function.  We will monitor closely.  If pain is not resolved, we will also increase Fentanyl patch to 37.5 mcg/hr q 3 days. . 3. Skin rash secondary to Erbitux, stable. Marland Kitchen He is on Cleocin cream and Hydrocortisone cream prn.  4. History of jaw bone pain due to neuralgia: Continue Neurontin with complite response.  5. Bone met: limited. S/p palliative XRT. He has been on Zometa every 6 weeks. Zometa today.  6. Anemia, gade 1 : This is from chemo. There is no active bleeding. No transfusion indicated.  7. Smoking: none at this  time.  8. Follow up: in 2 weeks  With labs and chemotherapy.    Spent more than half the time coordinating care.    Anderson Middlebrooks, MD 9/8/20148:27 AM

## 2013-05-20 NOTE — Telephone Encounter (Signed)
I called and spoke with the patient at 5:45pm.  He feels a bit fatigued but otherwise is feeling ok.  Has anti-emetics.  He will call with questions or concerns.  Myra Rude MD

## 2013-05-20 NOTE — Telephone Encounter (Signed)
Pt needs md spot on 1/22 with labs and chemo, MD is full Felicita Gage aware of need, she will notify me for md spot

## 2013-05-21 ENCOUNTER — Ambulatory Visit: Payer: Self-pay

## 2013-05-22 ENCOUNTER — Other Ambulatory Visit: Payer: Self-pay | Admitting: Lab

## 2013-05-22 ENCOUNTER — Ambulatory Visit: Payer: Self-pay

## 2013-05-22 ENCOUNTER — Telehealth: Payer: Self-pay | Admitting: Internal Medicine

## 2013-05-22 ENCOUNTER — Encounter: Payer: Self-pay | Admitting: Internal Medicine

## 2013-05-22 NOTE — Telephone Encounter (Signed)
Talked to patient gave hiom appt and MD, emailed Marcelino Duster regarding chemo

## 2013-05-23 ENCOUNTER — Telehealth: Payer: Self-pay | Admitting: *Deleted

## 2013-05-23 ENCOUNTER — Telehealth: Payer: Self-pay | Admitting: Internal Medicine

## 2013-05-23 NOTE — Telephone Encounter (Signed)
Talked to pt gave him appt for lab ,MD and chemo on Septermber 2014

## 2013-05-23 NOTE — Telephone Encounter (Signed)
Per staff message and POF I have scheduled appts.  JMW  

## 2013-05-27 ENCOUNTER — Other Ambulatory Visit: Payer: Self-pay | Admitting: Lab

## 2013-05-27 ENCOUNTER — Ambulatory Visit: Payer: Self-pay

## 2013-05-28 ENCOUNTER — Other Ambulatory Visit: Payer: Self-pay

## 2013-05-28 ENCOUNTER — Encounter: Payer: Self-pay | Admitting: Internal Medicine

## 2013-05-28 ENCOUNTER — Encounter: Payer: Self-pay | Admitting: Oncology

## 2013-05-28 DIAGNOSIS — C801 Malignant (primary) neoplasm, unspecified: Secondary | ICD-10-CM

## 2013-05-28 DIAGNOSIS — C770 Secondary and unspecified malignant neoplasm of lymph nodes of head, face and neck: Secondary | ICD-10-CM

## 2013-05-28 DIAGNOSIS — C7951 Secondary malignant neoplasm of bone: Secondary | ICD-10-CM

## 2013-05-28 DIAGNOSIS — R6884 Jaw pain: Secondary | ICD-10-CM

## 2013-05-28 DIAGNOSIS — C029 Malignant neoplasm of tongue, unspecified: Secondary | ICD-10-CM

## 2013-05-28 MED ORDER — HYDROCODONE-ACETAMINOPHEN 5-325 MG PO TABS: 1.0000 | ORAL_TABLET | Freq: Four times a day (QID) | ORAL | Status: DC | PRN

## 2013-06-03 ENCOUNTER — Encounter: Payer: Self-pay | Admitting: Internal Medicine

## 2013-06-03 ENCOUNTER — Ambulatory Visit (HOSPITAL_BASED_OUTPATIENT_CLINIC_OR_DEPARTMENT_OTHER): Payer: Self-pay | Admitting: Internal Medicine

## 2013-06-03 ENCOUNTER — Other Ambulatory Visit (HOSPITAL_BASED_OUTPATIENT_CLINIC_OR_DEPARTMENT_OTHER): Payer: Medicaid Other

## 2013-06-03 ENCOUNTER — Ambulatory Visit (HOSPITAL_BASED_OUTPATIENT_CLINIC_OR_DEPARTMENT_OTHER): Payer: Medicaid Other

## 2013-06-03 ENCOUNTER — Other Ambulatory Visit: Payer: Self-pay | Admitting: Internal Medicine

## 2013-06-03 VITALS — BP 117/80 | HR 81 | Temp 97.9°F | Resp 20 | Ht 72.0 in | Wt 159.6 lb

## 2013-06-03 DIAGNOSIS — C7951 Secondary malignant neoplasm of bone: Secondary | ICD-10-CM

## 2013-06-03 DIAGNOSIS — C801 Malignant (primary) neoplasm, unspecified: Secondary | ICD-10-CM

## 2013-06-03 DIAGNOSIS — C7800 Secondary malignant neoplasm of unspecified lung: Secondary | ICD-10-CM

## 2013-06-03 DIAGNOSIS — Z5112 Encounter for antineoplastic immunotherapy: Secondary | ICD-10-CM

## 2013-06-03 DIAGNOSIS — C770 Secondary and unspecified malignant neoplasm of lymph nodes of head, face and neck: Secondary | ICD-10-CM

## 2013-06-03 DIAGNOSIS — R911 Solitary pulmonary nodule: Secondary | ICD-10-CM

## 2013-06-03 DIAGNOSIS — Z5111 Encounter for antineoplastic chemotherapy: Secondary | ICD-10-CM

## 2013-06-03 DIAGNOSIS — C029 Malignant neoplasm of tongue, unspecified: Secondary | ICD-10-CM

## 2013-06-03 LAB — CBC WITH DIFFERENTIAL/PLATELET
BASO%: 0.4 % (ref 0.0–2.0)
Basophils Absolute: 0 10*3/uL (ref 0.0–0.1)
EOS%: 2.4 % (ref 0.0–7.0)
HCT: 30.1 % — ABNORMAL LOW (ref 38.4–49.9)
HGB: 9.6 g/dL — ABNORMAL LOW (ref 13.0–17.1)
LYMPH%: 16.7 % (ref 14.0–49.0)
MCH: 31.2 pg (ref 27.2–33.4)
MCHC: 31.9 g/dL — ABNORMAL LOW (ref 32.0–36.0)
NEUT%: 69.5 % (ref 39.0–75.0)
Platelets: 217 10*3/uL (ref 140–400)
lymph#: 0.9 10*3/uL (ref 0.9–3.3)

## 2013-06-03 LAB — COMPREHENSIVE METABOLIC PANEL (CC13)
AST: 18 U/L (ref 5–34)
Albumin: 3.2 g/dL — ABNORMAL LOW (ref 3.5–5.0)
Alkaline Phosphatase: 56 U/L (ref 40–150)
BUN: 7.2 mg/dL (ref 7.0–26.0)
Calcium: 9.1 mg/dL (ref 8.4–10.4)
Chloride: 104 mEq/L (ref 98–109)
Creatinine: 0.6 mg/dL — ABNORMAL LOW (ref 0.7–1.3)
Glucose: 74 mg/dl (ref 70–140)
Potassium: 3.5 mEq/L (ref 3.5–5.1)

## 2013-06-03 MED ORDER — FAMOTIDINE IN NACL 20-0.9 MG/50ML-% IV SOLN
INTRAVENOUS | Status: AC
  Filled 2013-06-03: qty 50

## 2013-06-03 MED ORDER — SODIUM CHLORIDE 0.9 % IV SOLN
180.0000 mg | Freq: Once | INTRAVENOUS | Status: AC
  Administered 2013-06-03: 180 mg via INTRAVENOUS
  Filled 2013-06-03: qty 18

## 2013-06-03 MED ORDER — FAMOTIDINE IN NACL 20-0.9 MG/50ML-% IV SOLN
20.0000 mg | Freq: Once | INTRAVENOUS | Status: AC
  Administered 2013-06-03: 20 mg via INTRAVENOUS

## 2013-06-03 MED ORDER — DIPHENHYDRAMINE HCL 50 MG/ML IJ SOLN
50.0000 mg | Freq: Once | INTRAMUSCULAR | Status: AC
  Administered 2013-06-03: 50 mg via INTRAVENOUS

## 2013-06-03 MED ORDER — CETUXIMAB CHEMO IV INJECTION 200 MG/100ML
150.0000 mg/m2 | Freq: Once | INTRAVENOUS | Status: AC
  Administered 2013-06-03: 300 mg via INTRAVENOUS
  Filled 2013-06-03: qty 150

## 2013-06-03 MED ORDER — ONDANSETRON 16 MG/50ML IVPB (CHCC)
INTRAVENOUS | Status: AC
  Filled 2013-06-03: qty 16

## 2013-06-03 MED ORDER — HEPARIN SOD (PORK) LOCK FLUSH 100 UNIT/ML IV SOLN
500.0000 [IU] | Freq: Once | INTRAVENOUS | Status: AC | PRN
  Administered 2013-06-03: 500 [IU]
  Filled 2013-06-03: qty 5

## 2013-06-03 MED ORDER — DEXAMETHASONE SODIUM PHOSPHATE 20 MG/5ML IJ SOLN
20.0000 mg | Freq: Once | INTRAMUSCULAR | Status: AC
  Administered 2013-06-03: 20 mg via INTRAVENOUS

## 2013-06-03 MED ORDER — DEXAMETHASONE SODIUM PHOSPHATE 20 MG/5ML IJ SOLN
INTRAMUSCULAR | Status: AC
  Filled 2013-06-03: qty 5

## 2013-06-03 MED ORDER — CARBOPLATIN CHEMO INTRADERMAL TEST DOSE 100MCG/0.02ML
100.0000 ug | Freq: Once | INTRADERMAL | Status: AC
  Administered 2013-06-03: 100 ug via INTRADERMAL
  Filled 2013-06-03: qty 0.01

## 2013-06-03 MED ORDER — SODIUM CHLORIDE 0.9 % IJ SOLN
10.0000 mL | INTRAMUSCULAR | Status: DC | PRN
  Administered 2013-06-03: 10 mL
  Filled 2013-06-03: qty 10

## 2013-06-03 MED ORDER — ONDANSETRON 16 MG/50ML IVPB (CHCC)
16.0000 mg | Freq: Once | INTRAVENOUS | Status: AC
  Administered 2013-06-03: 16 mg via INTRAVENOUS

## 2013-06-03 MED ORDER — SODIUM CHLORIDE 0.9 % IV SOLN
Freq: Once | INTRAVENOUS | Status: AC
  Administered 2013-06-03: 12:00:00 via INTRAVENOUS

## 2013-06-03 MED ORDER — FENTANYL 50 MCG/HR TD PT72: 1.0000 | MEDICATED_PATCH | TRANSDERMAL | Status: DC

## 2013-06-03 MED ORDER — PACLITAXEL CHEMO INJECTION 300 MG/50ML
48.0000 mg/m2 | Freq: Once | INTRAVENOUS | Status: AC
  Administered 2013-06-03: 90 mg via INTRAVENOUS
  Filled 2013-06-03: qty 15

## 2013-06-03 NOTE — Patient Instructions (Addendum)
[facility] Discharge Instructions for Patients Receiving Chemotherapy  Today you received the following chemotherapy agents Taxol, Carboplatin and Erbitux.  To help prevent nausea and vomiting after your treatment, we encourage you to take your nausea medication as prescribed.   If you develop nausea and vomiting that is not controlled by your nausea medication, call the clinic.   BELOW ARE SYMPTOMS THAT SHOULD BE REPORTED IMMEDIATELY:  *FEVER GREATER THAN 100.5 F  *CHILLS WITH OR WITHOUT FEVER  NAUSEA AND VOMITING THAT IS NOT CONTROLLED WITH YOUR NAUSEA MEDICATION  *UNUSUAL SHORTNESS OF BREATH  *UNUSUAL BRUISING OR BLEEDING  TENDERNESS IN MOUTH AND THROAT WITH OR WITHOUT PRESENCE OF ULCERS  *URINARY PROBLEMS  *BOWEL PROBLEMS  UNUSUAL RASH Items with * indicate a potential emergency and should be followed up as soon as possible.  Feel free to call the clinic you have any questions or concerns. The clinic phone number is [phone].    

## 2013-06-03 NOTE — Progress Notes (Signed)
Hematology and Oncology Follow Up Visit  Thomas Bean [id number] [birth year] 52 y.o. 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 He developed biopsy proven metastatic disease in 03/2012 with met in right neck, left lung mass and left illac.   Prior Therapy:  1. 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 He developed recurrence in the left submandibular gland in August 2012. He underwent on 07/27/2011 radical left neck dissection by Dr. Ezzard Standing and Dr. Geryl Rankins. Pathology case number [id number] showed total 15 lymph nodes from left radical neck dissection that was all negative. However the left submandibular gland mass showed a 3.5 cm invasive SCC, moderately differentiates involving the adjacent skeletal muscle tissue and bone a tissue and extending into the inked margin. There was no evidence of any lymphatic invasion or perineural invasion. He underwent further resection by Dr. Mary Sella at [facility] on 09/29/2011.  2. He received adjuvant chemoradiation therapy with q 3 week Cisplatin given 11/15/11-12/05/11 (received 2 of 3 doses; 3rd dose held due to febrile neutropenia and development of flap abscess). He received concurrent XRT 11/15/11 through 01/05/12.  3. For right neck recurrence in 03/2012; he underwent palliative right neck dissection by Dr. Manson Passey at [facility].   Current therapy:  Started on 05/15/2012 palliative chemo weekly Carboplatin/Taxol/Erbitux; 3 weeks on; 1 week off. Due to prolonged cytopenia, his chemo has been changed to weekly; 2 weeks on, 1 week off.   He is on zometa q 6 weeks (last dose 05/14/2013)  Interim History:  Thomas Bean 52 y.o. male returns for regular follow up visit. He was seen by me on 05/20/2013.  He reported no major changes since his last visit. He continues to have  rash on face, scalp, chest and back relieved with topical. His appetite is good. Patient still complains on positional pain in left side of his chest at site of surgery minimal improvement with neurontin.  The neurontin has however helped his face neuropathic symptoms.  He has had to double up on her prn pain medications.  Patient restarted Fentanyl patch. His hearing diminished after treatment with cisplatin. Occasionally he has nausea.  Patient handle his constipation with MiraLax usually requiring a weekly dose.  The patient denied fever, chills, night sweats, change in appetite or weight. He denied headaches, double vision, blurry vision, nasal congestion, nasal discharge, odynophagia or dysphagia. No palpitations, dyspnea, cough, abdominal pain,  vomiting, diarrhea, hematochezia. The patient denied dysuria, nocturia, polyuria, hematuria, myalgia, tingling.  Medications: I have reviewed the patient's current medications.  Current Outpatient Prescriptions  Medication Sig Dispense Refill  . clindamycin (CLINDAGEL) 1 % gel Apply topically as needed. Apply to facial and chest skin rash.  30 g  3  . clobetasol cream (TEMOVATE) 0.05 % Apply topically as needed. Apply to rash BID      . fentaNYL (DURAGESIC - DOSED MCG/HR) 25 MCG/HR patch Place 1 patch (25 mcg total) onto the skin every 3 (three) days.  10 patch  0  . gabapentin (NEURONTIN) 300 MG capsule Take 1 capsule (300 mg total) by mouth 3 (three) times daily.  90 capsule  3  . HYDROcodone-acetaminophen (NORCO/VICODIN) 5-325 MG per tablet Take 1 tablet by mouth every 6 (six) hours as needed for pain.  90 tablet  0  . hydrocortisone cream 0.5 % Apply topically 2 (two) times daily as  needed. Apply to axilla rash twice daily as needed.      . lidocaine-prilocaine (EMLA) cream Apply topically as needed. Apply to [facility] site one hour prior to needle stick to numb skin.  30 g  3  . LORazepam (ATIVAN) 0.5 MG tablet DISSOLVE 1 TABLET UNDER THE TONGUE EVERY  6 HOURS AS NEEDED FOR ANXIETY OR FOR NAUSEA AND VOMITING  60 tablet  0  . Nutritional Supplements (FEEDING SUPPLEMENT, OSMOLITE 1.5 CAL,) LIQD Begin Osmolite 1.5 at 60 ml/hr for 12 hours nocturnal continuous feeding daily. Flush with 60 ml free water before and after continuous feedings. Continue 1 can of Osmolite 1.5 via gravity feeding TID with 60 ml free water before and after each bolus feeding. Please send pump to patient.  1422 mL  0  . omeprazole (PRILOSEC) 10 MG capsule Take 10 mg by mouth daily.      . ondansetron (ZOFRAN) 8 MG tablet Take 8 mg by mouth every 12 (twelve) hours as needed.      . prochlorperazine (COMPAZINE) 10 MG tablet Take 1 tablet (10 mg total) by mouth every 6 (six) hours as needed.  60 tablet  1  . sertraline (ZOLOFT) 50 MG tablet Take 1.5 tablets (75 mg total) by mouth daily.  60 tablet  0  . sodium fluoride (PREVIDENT 5000 PLUS) 1.1 % CREA dental cream Apply thin ribbon of cream to tooth brush. Brush teeth for 2 minutes. Spit out excess-DO NOT swallow. DO NOT rinse afterwards. Repeat nightly.  1 Tube  prn   No current facility-administered medications for this visit.   Allergies:  Allergies  Allergen Reactions  . Codeine Phosphate     REACTION: rash, swelling  . Morphine And Related   . Tape     Blisters; please use paper tape    Past Medical History, Surgical history, Social history, and Family History were reviewed and updated.  Review of Systems: Constitutional:  Negative for fever, chills, night sweats, anorexia, weight loss, 4 out of 10 pain to left rib cage. Cardiovascular: no chest pain or dyspnea on exertion Respiratory: no cough, shortness of breath, or wheezing Neurological: no TIA or stroke symptoms Dermatological: positive for rash negative for lumps ENT: negative for - oral lesions Skin: Negative. Gastrointestinal: no abdominal pain, change in bowel habits, or black or bloody stools positive for - G tube Genito-Urinary: no dysuria, trouble  voiding, or hematuria Hematological and Lymphatic: negative for - bleeding problems Musculoskeletal: negative for - gait disturbance Remaining ROS negative. Physical Exam: BP 117/80  Pulse 81  Temp(Src) 97.9 F (36.6 C) (Oral)  Resp 20  Ht 6' (1.829 m)  Wt 159 lb 9.6 oz (72.394 kg)  BMI 21.64 kg/m2 ECOG: 0 General appearance: alert, cooperative, appears stated age and no distress Head: Normocephalic, without obvious abnormality, atraumatic HEENT: Sclerae anicteric. Conjuncitvae pink. PERRLA; Oral mucosa moist without ulceration or thrush.  Neck: + cervical adenopathy about 1 cm rubbery and non-tender, supple, symmetrical, trachea midline and thyroid not enlarged, symmetric, no tenderness/mass/nodules. No sumbandibular adenopathy. S/p major neck surgery with neck scars well healed.  Lymph nodes: Cervical adenopathy: as noted above.  Supraclavicular adenopathy: None appreciated; No  Heart:regular rate and rhythm, S1, S2 normal, no murmur, click, rub or gallop Lung:chest clear, no wheezing, rales, normal symmetric air entry, Heart exam - S1, S2 normal, no murmur, no gallop, rate regular Abdomin: soft, non-tender, without masses or organomegaly + G tube without signs of infections.  EXT: No peripheral edema Skin: Rash  on face, back, chest with erythema.   Lab Results: CBC    Component Value Date/Time   WBC 5.1 06/03/2013 0948   WBC 1.8* 08/20/2012 0846   RBC 3.08* 06/03/2013 0948   RBC 2.97* 08/20/2012 0846   HGB 9.6* 06/03/2013 0948   HGB 10.1* 08/20/2012 0846   HCT 30.1* 06/03/2013 0948   HCT 30.0* 08/20/2012 0846   PLT 217 06/03/2013 0948   PLT 99* 08/20/2012 0846   MCV 97.7 06/03/2013 0948   MCV 101.0* 08/20/2012 0846   MCH 31.2 06/03/2013 0948   MCH 34.0 08/20/2012 0846   MCHC 31.9* 06/03/2013 0948   MCHC 33.7 08/20/2012 0846   RDW 14.8* 06/03/2013 0948   RDW 15.9* 08/20/2012 0846   LYMPHSABS 0.9 06/03/2013 0948   LYMPHSABS 0.4* 08/20/2012 0846   MONOABS 0.6 06/03/2013 0948   MONOABS  0.1 08/20/2012 0846   EOSABS 0.1 06/03/2013 0948   EOSABS 0.1 08/20/2012 0846   BASOSABS 0.0 06/03/2013 0948   BASOSABS 0.0 08/20/2012 0846    CMP     Component Value Date/Time   NA 139 05/20/2013 0802   NA 138 06/25/2012 0858   K 4.0 05/20/2013 0802   K 3.1* 06/25/2012 0858   CL 102 02/25/2013 0753   CL 99 06/25/2012 0858   CO2 26 05/20/2013 0802   CO2 30 06/25/2012 0858   GLUCOSE 108 05/20/2013 0802   GLUCOSE 86 02/25/2013 0753   GLUCOSE 88 06/25/2012 0858   BUN 8.1 05/20/2013 0802   BUN 10 06/25/2012 0858   CREATININE 0.6* 05/20/2013 0802   CREATININE 0.47* 06/25/2012 0858   CALCIUM 9.8 05/20/2013 0802   CALCIUM 9.6 06/25/2012 0858   PROT 7.6 05/20/2013 0802   PROT 6.8 05/03/2012 0905   ALBUMIN 3.5 05/20/2013 0802   ALBUMIN 3.8 05/03/2012 0905   AST 22 05/20/2013 0802   AST 14 05/03/2012 0905   ALT 18 05/20/2013 0802   ALT 11 05/03/2012 0905   ALKPHOS 56 05/20/2013 0802   ALKPHOS 54 05/03/2012 0905   BILITOT 0.21 05/20/2013 0802   BILITOT 0.3 05/03/2012 0905   GFRNONAA >90 12/29/2011 0350   GFRAA >90 12/29/2011 0350      Radiological Studies: CT CHEST, ABDOMEN AND PELVIS WITH CONTRAST (05/16/2013) Technique: Multidetector CT imaging of the chest, abdomen and pelvis was performed following the standard protocol during bolus administration of intravenous contrast.  Contrast: OMNIPAQUE IOHEXOL 300 MG/ML SOLN  Comparison: 03/01/2013  CT CHEST  Findings: 4.3 x 3.2 cm cavitary lesion in the superior segment left lower lobe (series 5/image 24), previously 3.5 x 2.6 cm.  Increasing fluid within the lesion.  Moderate paraseptal emphysematous changes in the upper lobes.  No new/suspicious pulmonary nodules. No pleural effusion or pneumothorax.  Postprocedural changes in the lower left neck.  The visualized thyroid is unremarkable.  The heart is top normal in size. No pericardial effusion.  Left chest port.  No suspicious mediastinal, hilar, or axillary lymphadenopathy.  Surgical clips in the left  axilla.  Mild degenerative changes of the thoracic spine.  IMPRESSION:  4.3 x 3.2 cm cavitary lesion/metastasis in the superior segment left lower lobe, mildly increased.  Moderate paraseptal emphysematous changes.  CT ABDOMEN AND PELVIS  Findings: Gastrostomy tube in satisfactory position.  Liver, pancreas, and adrenal glands are within normal limits.  5.9 x 4.1 cm rim calcified, multiloculated lesion in the medial spleen (series 2/image 63), unchanged.  Suspected cholelithiasis (series 2/image 75), without associated inflammatory changes.  1.5 x 1.2 cm  medial right upper pole renal cyst (series 2/image  71). Left kidney is within normal limits. No hydronephrosis.  No evidence of bowel obstruction. Normal appendix.  Atherosclerotic calcifications of the abdominal aorta and branch  vessels.  No abdominopelvic ascites.  No suspicious abdominopelvic lymphadenopathy.  Prostate is at the upper limits of normal for size.  Bladder is within normal limits.  Degenerative changes of the lumbar spine. Known metastasis in the  left iliac bone is not evident on CT.  IMPRESSION:  Known metastasis in the left iliac bone is not evident on CT.  No evidence of new/progressive metastatic disease in the abdomen/pelvis.  Additional stable ancillary findings as above.  Impression and Plan: ASSESSMENT AND PLAN: 1. Recurrent left lateral tongue squamous cell carcinoma: He now has metastatic disease to the lung and bone.  -His restaging CT from 02/18/13 showed enlargement of the left cavitary lesion. There was no convincing evidence of disease progression anywhere else.  - He was evaluated at [facility] to see if he is a candidate for any clinical trial.  - We discussed with patient and his son change his treatment with Cetuximab to 150 mg/m2 weekly and patient agrees to try. - We will proceed with another treatment of Carboplatin/Taxol/Erbitux today.  Handout detailing chemotherapy provided.  Patient instructed to  watch for fever greater than 100.5 and call MD on call or report to the ER.  2. Positional chest pain at surgical site. Reviewed CT scan of neck, chest, abdomen and pelvis obtained on 09/04. Mild progression of 4.3 x 3.2 cm cavitary lesion/metastasis in the superior segment left lower lobe representing stable disease.   -- Last visit, we Increased Neurontin to 300 mg in morning, 300 mg in afternoon, and 900 mg q hs.  Counseled on common side-effects include sedation and effects on kidney function.  We will monitor closely.  Since pain is not resolved, we will also increase Fentanyl patch to 50 mcg/hr q 3 days. Prescription provided 3. Skin rash secondary to Erbitux, stable.  He is on Cleocin cream and Hydrocortisone cream prn.  4. History of jaw bone pain due to neuralgia: Continue Neurontin with complite response.  5. Bone met: limited. S/p palliative XRT. He has been on Zometa every 6 weeks. Zometa given on 05/14/2013. 6. Anemia, gade 1 : This is from chemo. There is no active bleeding. No transfusion indicated.  7. Smoking: none at this time.  8. Follow up: in 3 weeks  With labs and chemotherapy.    I spent 15 minutes or more than half the time coordinating care during 30 minute follow-up visit.    Shalona Harbour, MD 06/03/2013 11:22 AM

## 2013-06-03 NOTE — Progress Notes (Signed)
Called and left ms. mcneil a message to say I was faxing bill to her for him to meet his ded and if she needed more to call me and let me know.

## 2013-06-04 ENCOUNTER — Telehealth: Payer: Self-pay | Admitting: *Deleted

## 2013-06-04 ENCOUNTER — Encounter: Payer: Self-pay | Admitting: Internal Medicine

## 2013-06-04 NOTE — Telephone Encounter (Signed)
Per triage RN and POF I have scheduled appts. I have called and spoke with the patient. Patient given appts.

## 2013-06-07 ENCOUNTER — Other Ambulatory Visit: Payer: Self-pay | Admitting: Internal Medicine

## 2013-06-07 ENCOUNTER — Other Ambulatory Visit: Payer: Self-pay | Admitting: Hematology and Oncology

## 2013-06-07 DIAGNOSIS — C029 Malignant neoplasm of tongue, unspecified: Secondary | ICD-10-CM

## 2013-06-10 ENCOUNTER — Other Ambulatory Visit (HOSPITAL_BASED_OUTPATIENT_CLINIC_OR_DEPARTMENT_OTHER): Payer: Medicaid Other

## 2013-06-10 ENCOUNTER — Ambulatory Visit (HOSPITAL_BASED_OUTPATIENT_CLINIC_OR_DEPARTMENT_OTHER): Payer: Medicaid Other

## 2013-06-10 DIAGNOSIS — C7800 Secondary malignant neoplasm of unspecified lung: Secondary | ICD-10-CM

## 2013-06-10 DIAGNOSIS — C7951 Secondary malignant neoplasm of bone: Secondary | ICD-10-CM

## 2013-06-10 DIAGNOSIS — C029 Malignant neoplasm of tongue, unspecified: Secondary | ICD-10-CM

## 2013-06-10 DIAGNOSIS — Z5111 Encounter for antineoplastic chemotherapy: Secondary | ICD-10-CM

## 2013-06-10 DIAGNOSIS — C770 Secondary and unspecified malignant neoplasm of lymph nodes of head, face and neck: Secondary | ICD-10-CM

## 2013-06-10 DIAGNOSIS — Z5112 Encounter for antineoplastic immunotherapy: Secondary | ICD-10-CM

## 2013-06-10 LAB — CBC WITH DIFFERENTIAL/PLATELET
Basophils Absolute: 0 10*3/uL (ref 0.0–0.1)
Eosinophils Absolute: 0.2 10*3/uL (ref 0.0–0.5)
HGB: 9 g/dL — ABNORMAL LOW (ref 13.0–17.1)
LYMPH%: 11 % — ABNORMAL LOW (ref 14.0–49.0)
MCHC: 33.6 g/dL (ref 32.0–36.0)
MONO#: 0.4 10*3/uL (ref 0.1–0.9)
NEUT#: 4.8 10*3/uL (ref 1.5–6.5)
NEUT%: 78.3 % — ABNORMAL HIGH (ref 39.0–75.0)
Platelets: 235 10*3/uL (ref 140–400)
RBC: 2.78 10*6/uL — ABNORMAL LOW (ref 4.20–5.82)
RDW: 16.2 % — ABNORMAL HIGH (ref 11.0–14.6)
WBC: 6.1 10*3/uL (ref 4.0–10.3)

## 2013-06-10 LAB — COMPREHENSIVE METABOLIC PANEL (CC13)
AST: 18 U/L (ref 5–34)
Albumin: 3 g/dL — ABNORMAL LOW (ref 3.5–5.0)
BUN: 10.5 mg/dL (ref 7.0–26.0)
CO2: 30 mEq/L — ABNORMAL HIGH (ref 22–29)
Calcium: 9.4 mg/dL (ref 8.4–10.4)
Chloride: 101 mEq/L (ref 98–109)
Creatinine: 0.7 mg/dL (ref 0.7–1.3)
Glucose: 109 mg/dl (ref 70–140)
Potassium: 3.9 mEq/L (ref 3.5–5.1)
Sodium: 139 mEq/L (ref 136–145)
Total Protein: 6.9 g/dL (ref 6.4–8.3)

## 2013-06-10 MED ORDER — PACLITAXEL CHEMO INJECTION 300 MG/50ML
48.0000 mg/m2 | Freq: Once | INTRAVENOUS | Status: AC
  Administered 2013-06-10: 90 mg via INTRAVENOUS
  Filled 2013-06-10: qty 15

## 2013-06-10 MED ORDER — CETUXIMAB CHEMO IV INJECTION 200 MG/100ML
150.0000 mg/m2 | Freq: Once | INTRAVENOUS | Status: AC
  Administered 2013-06-10: 300 mg via INTRAVENOUS
  Filled 2013-06-10: qty 150

## 2013-06-10 MED ORDER — SODIUM CHLORIDE 0.9 % IV SOLN
Freq: Once | INTRAVENOUS | Status: AC
  Administered 2013-06-10: 13:00:00 via INTRAVENOUS

## 2013-06-10 MED ORDER — ONDANSETRON 16 MG/50ML IVPB (CHCC)
16.0000 mg | Freq: Once | INTRAVENOUS | Status: AC
  Administered 2013-06-10: 16 mg via INTRAVENOUS

## 2013-06-10 MED ORDER — CARBOPLATIN CHEMO INTRADERMAL TEST DOSE 100MCG/0.02ML
100.0000 ug | Freq: Once | INTRADERMAL | Status: AC
  Administered 2013-06-10: 100 ug via INTRADERMAL
  Filled 2013-06-10: qty 0.01

## 2013-06-10 MED ORDER — FAMOTIDINE IN NACL 20-0.9 MG/50ML-% IV SOLN
20.0000 mg | Freq: Once | INTRAVENOUS | Status: AC
  Administered 2013-06-10: 20 mg via INTRAVENOUS

## 2013-06-10 MED ORDER — SODIUM CHLORIDE 0.9 % IJ SOLN
10.0000 mL | INTRAMUSCULAR | Status: DC | PRN
  Administered 2013-06-10: 10 mL
  Filled 2013-06-10: qty 10

## 2013-06-10 MED ORDER — SODIUM CHLORIDE 0.9 % IV SOLN
180.0000 mg | Freq: Once | INTRAVENOUS | Status: AC
  Administered 2013-06-10: 180 mg via INTRAVENOUS
  Filled 2013-06-10: qty 18

## 2013-06-10 MED ORDER — DEXAMETHASONE SODIUM PHOSPHATE 20 MG/5ML IJ SOLN
INTRAMUSCULAR | Status: AC
  Filled 2013-06-10: qty 5

## 2013-06-10 MED ORDER — DIPHENHYDRAMINE HCL 50 MG/ML IJ SOLN
INTRAMUSCULAR | Status: AC
  Filled 2013-06-10: qty 1

## 2013-06-10 MED ORDER — DIPHENHYDRAMINE HCL 50 MG/ML IJ SOLN
50.0000 mg | Freq: Once | INTRAMUSCULAR | Status: AC
  Administered 2013-06-10: 50 mg via INTRAVENOUS

## 2013-06-10 MED ORDER — HEPARIN SOD (PORK) LOCK FLUSH 100 UNIT/ML IV SOLN
500.0000 [IU] | Freq: Once | INTRAVENOUS | Status: AC | PRN
  Administered 2013-06-10: 500 [IU]
  Filled 2013-06-10: qty 5

## 2013-06-10 MED ORDER — ONDANSETRON 8 MG/NS 50 ML IVPB
INTRAVENOUS | Status: AC
  Filled 2013-06-10: qty 8

## 2013-06-10 MED ORDER — DEXAMETHASONE SODIUM PHOSPHATE 20 MG/5ML IJ SOLN
20.0000 mg | Freq: Once | INTRAMUSCULAR | Status: AC
  Administered 2013-06-10: 20 mg via INTRAVENOUS

## 2013-06-10 MED ORDER — FAMOTIDINE IN NACL 20-0.9 MG/50ML-% IV SOLN
INTRAVENOUS | Status: AC
  Filled 2013-06-10: qty 50

## 2013-06-10 NOTE — Progress Notes (Signed)
Carbo test dose to right medial forearm- no reaction after 5 min, and . Pre meds started & Chemo ordered.

## 2013-06-10 NOTE — Patient Instructions (Addendum)
[facility] Discharge Instructions for Patients Receiving Chemotherapy Today you received erbitux, taxol and carboplatin.  To help prevent nausea and vomiting after your treatment, we encourage you to take your nausea medication as directed- zofran and compazine.   If you develop nausea and vomiting that is not controlled by your nausea medication, call the clinic.   BELOW ARE SYMPTOMS THAT SHOULD BE REPORTED IMMEDIATELY:  *FEVER GREATER THAN 100.5 F  *CHILLS WITH OR WITHOUT FEVER  NAUSEA AND VOMITING THAT IS NOT CONTROLLED WITH YOUR NAUSEA MEDICATION  *UNUSUAL SHORTNESS OF BREATH  *UNUSUAL BRUISING OR BLEEDING  TENDERNESS IN MOUTH AND THROAT WITH OR WITHOUT PRESENCE OF ULCERS  *URINARY PROBLEMS  *BOWEL PROBLEMS  UNUSUAL RASH Items with * indicate a potential emergency and should be followed up as soon as possible.  Feel free to call the clinic you have any questions or concerns. The clinic phone number is [phone].   [facility] Discharge Instructions for Patients Receiving Chemotherapy  Today you received the following chemotherapy agents  Erbitux, taxol and carboplatin To help prevent nausea and vomiting after your treatment, we encourage you to take your nausea medication zofran and compazine.   If you develop nausea and vomiting that is not controlled by your nausea medication, call the clinic.   BELOW ARE SYMPTOMS THAT SHOULD BE REPORTED IMMEDIATELY: *FEVER GREATER THAN 100.5 F *CHILLS WITH OR WITHOUT FEVER NAUSEA AND VOMITING THAT IS NOT CONTROLLED WITH YOUR NAUSEA MEDICATION *UNUSUAL SHORTNESS OF BREATH *UNUSUAL BRUISING OR BLEEDING TENDERNESS IN MOUTH AND THROAT WITH OR WITHOUT PRESENCE OF ULCERS *URINARY PROBLEMS *BOWEL PROBLEMS UNUSUAL RASH Items with * indicate a potential emergency and should be followed up as soon as possible.  Feel free to call the clinic you have any questions or concerns. The clinic phone number is  [phone].

## 2013-06-10 NOTE — Progress Notes (Signed)
Patient discharge.  States he is not driving himself.  All questions answered.

## 2013-06-14 ENCOUNTER — Other Ambulatory Visit: Payer: Self-pay | Admitting: Internal Medicine

## 2013-06-23 ENCOUNTER — Other Ambulatory Visit: Payer: Self-pay | Admitting: Internal Medicine

## 2013-06-24 ENCOUNTER — Ambulatory Visit (HOSPITAL_BASED_OUTPATIENT_CLINIC_OR_DEPARTMENT_OTHER): Payer: Medicaid Other

## 2013-06-24 ENCOUNTER — Telehealth: Payer: Self-pay | Admitting: *Deleted

## 2013-06-24 ENCOUNTER — Other Ambulatory Visit (HOSPITAL_BASED_OUTPATIENT_CLINIC_OR_DEPARTMENT_OTHER): Payer: Medicaid Other

## 2013-06-24 ENCOUNTER — Telehealth: Payer: Self-pay | Admitting: Internal Medicine

## 2013-06-24 ENCOUNTER — Ambulatory Visit (HOSPITAL_BASED_OUTPATIENT_CLINIC_OR_DEPARTMENT_OTHER): Payer: Self-pay | Admitting: Internal Medicine

## 2013-06-24 VITALS — BP 128/74 | HR 85 | Temp 98.1°F | Resp 20 | Ht 72.0 in | Wt 165.6 lb

## 2013-06-24 DIAGNOSIS — R21 Rash and other nonspecific skin eruption: Secondary | ICD-10-CM

## 2013-06-24 DIAGNOSIS — C7951 Secondary malignant neoplasm of bone: Secondary | ICD-10-CM

## 2013-06-24 DIAGNOSIS — C029 Malignant neoplasm of tongue, unspecified: Secondary | ICD-10-CM

## 2013-06-24 DIAGNOSIS — M549 Dorsalgia, unspecified: Secondary | ICD-10-CM

## 2013-06-24 DIAGNOSIS — R079 Chest pain, unspecified: Secondary | ICD-10-CM

## 2013-06-24 DIAGNOSIS — C7800 Secondary malignant neoplasm of unspecified lung: Secondary | ICD-10-CM

## 2013-06-24 DIAGNOSIS — C770 Secondary and unspecified malignant neoplasm of lymph nodes of head, face and neck: Secondary | ICD-10-CM

## 2013-06-24 DIAGNOSIS — Z5112 Encounter for antineoplastic immunotherapy: Secondary | ICD-10-CM

## 2013-06-24 DIAGNOSIS — R911 Solitary pulmonary nodule: Secondary | ICD-10-CM

## 2013-06-24 DIAGNOSIS — Z5111 Encounter for antineoplastic chemotherapy: Secondary | ICD-10-CM

## 2013-06-24 DIAGNOSIS — D6481 Anemia due to antineoplastic chemotherapy: Secondary | ICD-10-CM

## 2013-06-24 LAB — CBC WITH DIFFERENTIAL/PLATELET
BASO%: 0.5 % (ref 0.0–2.0)
EOS%: 3.4 % (ref 0.0–7.0)
Eosinophils Absolute: 0.2 10*3/uL (ref 0.0–0.5)
HCT: 29.8 % — ABNORMAL LOW (ref 38.4–49.9)
MCH: 30.9 pg (ref 27.2–33.4)
MCHC: 31.5 g/dL — ABNORMAL LOW (ref 32.0–36.0)
MONO#: 0.6 10*3/uL (ref 0.1–0.9)
NEUT#: 4.8 10*3/uL (ref 1.5–6.5)
NEUT%: 77.6 % — ABNORMAL HIGH (ref 39.0–75.0)
Platelets: 225 10*3/uL (ref 140–400)
RBC: 3.04 10*6/uL — ABNORMAL LOW (ref 4.20–5.82)
RDW: 15.3 % — ABNORMAL HIGH (ref 11.0–14.6)
WBC: 6.2 10*3/uL (ref 4.0–10.3)
lymph#: 0.6 10*3/uL — ABNORMAL LOW (ref 0.9–3.3)
nRBC: 0 % (ref 0–0)

## 2013-06-24 LAB — COMPREHENSIVE METABOLIC PANEL (CC13)
ALT: 12 U/L (ref 0–55)
AST: 18 U/L (ref 5–34)
Albumin: 3.1 g/dL — ABNORMAL LOW (ref 3.5–5.0)
BUN: 7.6 mg/dL (ref 7.0–26.0)
CO2: 28 mEq/L (ref 22–29)
Calcium: 9.2 mg/dL (ref 8.4–10.4)
Chloride: 105 mEq/L (ref 98–109)
Potassium: 4 mEq/L (ref 3.5–5.1)
Sodium: 139 mEq/L (ref 136–145)
Total Protein: 7.2 g/dL (ref 6.4–8.3)

## 2013-06-24 MED ORDER — ONDANSETRON 16 MG/50ML IVPB (CHCC)
16.0000 mg | Freq: Once | INTRAVENOUS | Status: AC
  Administered 2013-06-24: 16 mg via INTRAVENOUS

## 2013-06-24 MED ORDER — CETUXIMAB CHEMO IV INJECTION 200 MG/100ML
150.0000 mg/m2 | Freq: Once | INTRAVENOUS | Status: AC
  Administered 2013-06-24: 300 mg via INTRAVENOUS
  Filled 2013-06-24: qty 150

## 2013-06-24 MED ORDER — HYDROCODONE-ACETAMINOPHEN 5-325 MG PO TABS: 1.0000 | ORAL_TABLET | ORAL | Status: DC | PRN

## 2013-06-24 MED ORDER — FAMOTIDINE IN NACL 20-0.9 MG/50ML-% IV SOLN
20.0000 mg | Freq: Once | INTRAVENOUS | Status: AC
  Administered 2013-06-24: 20 mg via INTRAVENOUS

## 2013-06-24 MED ORDER — ONDANSETRON 16 MG/50ML IVPB (CHCC)
INTRAVENOUS | Status: AC
  Filled 2013-06-24: qty 16

## 2013-06-24 MED ORDER — SODIUM CHLORIDE 0.9 % IV SOLN
180.0000 mg | Freq: Once | INTRAVENOUS | Status: AC
  Administered 2013-06-24: 180 mg via INTRAVENOUS
  Filled 2013-06-24: qty 18

## 2013-06-24 MED ORDER — DIPHENHYDRAMINE HCL 50 MG/ML IJ SOLN
50.0000 mg | Freq: Once | INTRAMUSCULAR | Status: DC
Start: 2013-06-24 — End: 2013-06-24

## 2013-06-24 MED ORDER — HEPARIN SOD (PORK) LOCK FLUSH 100 UNIT/ML IV SOLN
500.0000 [IU] | Freq: Once | INTRAVENOUS | Status: AC | PRN
  Administered 2013-06-24: 500 [IU]
  Filled 2013-06-24: qty 5

## 2013-06-24 MED ORDER — LORAZEPAM 0.5 MG PO TABS: 0.5000 mg | ORAL_TABLET | Freq: Four times a day (QID) | ORAL | Status: DC | PRN

## 2013-06-24 MED ORDER — FAMOTIDINE IN NACL 20-0.9 MG/50ML-% IV SOLN
INTRAVENOUS | Status: AC
  Filled 2013-06-24: qty 50

## 2013-06-24 MED ORDER — DEXAMETHASONE SODIUM PHOSPHATE 20 MG/5ML IJ SOLN
20.0000 mg | Freq: Once | INTRAMUSCULAR | Status: AC
  Administered 2013-06-24: 20 mg via INTRAVENOUS

## 2013-06-24 MED ORDER — PACLITAXEL CHEMO INJECTION 300 MG/50ML
48.0000 mg/m2 | Freq: Once | INTRAVENOUS | Status: AC
  Administered 2013-06-24: 90 mg via INTRAVENOUS
  Filled 2013-06-24: qty 15

## 2013-06-24 MED ORDER — SODIUM CHLORIDE 0.9 % IV SOLN
Freq: Once | INTRAVENOUS | Status: AC
  Administered 2013-06-24: 11:00:00 via INTRAVENOUS

## 2013-06-24 MED ORDER — ZOLEDRONIC ACID 4 MG/100ML IV SOLN
4.0000 mg | Freq: Once | INTRAVENOUS | Status: AC
  Administered 2013-06-24: 4 mg via INTRAVENOUS
  Filled 2013-06-24: qty 100

## 2013-06-24 MED ORDER — GABAPENTIN 300 MG PO CAPS: 300.0000 mg | ORAL_CAPSULE | Freq: Three times a day (TID) | ORAL | Status: DC

## 2013-06-24 MED ORDER — DIPHENHYDRAMINE HCL 50 MG/ML IJ SOLN
INTRAMUSCULAR | Status: AC
Start: 2013-06-24 — End: 2013-06-24
  Filled 2013-06-24: qty 1

## 2013-06-24 MED ORDER — CARBOPLATIN CHEMO INTRADERMAL TEST DOSE 100MCG/0.02ML
100.0000 ug | Freq: Once | INTRADERMAL | Status: AC
  Administered 2013-06-24: 100 ug via INTRADERMAL
  Filled 2013-06-24: qty 0.01

## 2013-06-24 MED ORDER — DEXAMETHASONE SODIUM PHOSPHATE 20 MG/5ML IJ SOLN
INTRAMUSCULAR | Status: AC
  Filled 2013-06-24: qty 5

## 2013-06-24 MED ORDER — SODIUM CHLORIDE 0.9 % IJ SOLN
10.0000 mL | INTRAMUSCULAR | Status: DC | PRN
  Administered 2013-06-24: 10 mL
  Filled 2013-06-24: qty 10

## 2013-06-24 MED ORDER — DIPHENHYDRAMINE HCL 50 MG/ML IJ SOLN
50.0000 mg | Freq: Once | INTRAMUSCULAR | Status: AC
  Administered 2013-06-24: 50 mg via INTRAVENOUS

## 2013-06-24 NOTE — Telephone Encounter (Signed)
Pt's father here with concern rx for ativan not sent to pharmacy. Pt currently in treatment room..Reviewed Medications ordered by Dr. Rosie Fate, Ativan 0.5mg  sent to Pharmacy. Call to pharmacy- they did not receive. Rx called into [facility]. Pt nurse in infusion notified rx ready for pick up.

## 2013-06-24 NOTE — Patient Instructions (Signed)
[facility] Discharge Instructions for Patients Receiving Chemotherapy  Today you received the following chemotherapy agents Erbitux, Taxol, Carboplatin and Zometa.  To help prevent nausea and vomiting after your treatment, we encourage you to take your nausea medication as prescribed.   If you develop nausea and vomiting that is not controlled by your nausea medication, call the clinic.   BELOW ARE SYMPTOMS THAT SHOULD BE REPORTED IMMEDIATELY:  *FEVER GREATER THAN 100.5 F  *CHILLS WITH OR WITHOUT FEVER  NAUSEA AND VOMITING THAT IS NOT CONTROLLED WITH YOUR NAUSEA MEDICATION  *UNUSUAL SHORTNESS OF BREATH  *UNUSUAL BRUISING OR BLEEDING  TENDERNESS IN MOUTH AND THROAT WITH OR WITHOUT PRESENCE OF ULCERS  *URINARY PROBLEMS  *BOWEL PROBLEMS  UNUSUAL RASH Items with * indicate a potential emergency and should be followed up as soon as possible.  Feel free to call the clinic you have any questions or concerns. The clinic phone number is [phone].

## 2013-06-24 NOTE — Telephone Encounter (Signed)
gv and printed aptp sched and avs for pt for OCT adn NOV...MW added tx.

## 2013-06-24 NOTE — Progress Notes (Signed)
Hematology and Oncology Follow Up Visit  Thomas Bean [id number] [birth year] 52 y.o. 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 He developed biopsy proven metastatic disease in 03/2012 with met in right neck, left lung mass and left illac.   Prior Therapy:  1. 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 He developed recurrence in the left submandibular gland in August 2012. He underwent on 07/27/2011 radical left neck dissection by Dr. Ezzard Standing and Dr. Geryl Rankins. Pathology case number [id number] showed total 15 lymph nodes from left radical neck dissection that was all negative. However the left submandibular gland mass showed a 3.5 cm invasive SCC, moderately differentiates involving the adjacent skeletal muscle tissue and bone a tissue and extending into the inked margin. There was no evidence of any lymphatic invasion or perineural invasion. He underwent further resection by Dr. Mary Sella at [facility] on 09/29/2011.  2. He received adjuvant chemoradiation therapy with q 3 week Cisplatin given 11/15/11-12/05/11 (received 2 of 3 doses; 3rd dose held due to febrile neutropenia and development of flap abscess). He received concurrent XRT 11/15/11 through 01/05/12.  3. For right neck recurrence in 03/2012; he underwent palliative right neck dissection by Dr. Manson Passey at [facility].   Current therapy:  Started on 05/15/2012 palliative chemo weekly Carboplatin/Taxol/Erbitux; 3 weeks on; 1 week off. Due to prolonged cytopenia, his chemo has been changed to weekly; 2 weeks on, 1 week off.   He is on zometa q 6 weeks (last dose 05/14/2013)  Interim History:  Feliberto Bean 52 y.o. male returns for regular follow up  visit. He was seen by me on 06/03/2013.  He reported worsening neck pain and upper chest pain with radiation to his left scapular since his last visit.  He reports that increasing his fentanyl patch dose helped for a week or so but then it became worst.  He also uses neurotin for this pain.  He continues to have rash on face, scalp, chest and back relieved with topical but it is improved since his last visit. Marland Kitchen His appetite is good. His hearing diminished after treatment with cisplatin. Occasionally he has nausea.  Patient handle his constipation with MiraLax usually requiring a weekly dose.  The patient denied fever, chills, night sweats, change in appetite or weight. He denied headaches, double vision, blurry vision, nasal congestion, nasal discharge, odynophagia or dysphagia. No palpitations, dyspnea, cough, abdominal pain,  vomiting, diarrhea, hematochezia. The patient denied dysuria, nocturia, polyuria, hematuria, myalgia, tingling.  Today, he request refills for vicodin, ativan and neurontin.   Medications: I have reviewed the patient's current medications.  Current Outpatient Prescriptions  Medication Sig Dispense Refill  . clindamycin (CLINDAGEL) 1 % gel Apply topically as needed. Apply to facial and chest skin rash.  30 g  3  . clobetasol cream (TEMOVATE) 0.05 % Apply topically as needed. Apply to rash BID      . fentaNYL (DURAGESIC - DOSED MCG/HR) 25 MCG/HR patch Place 1 patch (25 mcg total) onto the skin every 3 (three) days.  10 patch  0  . gabapentin (NEURONTIN) 300 MG capsule Take 1 capsule (300 mg total) by mouth 3 (three) times daily.  90 capsule  3  . HYDROcodone-acetaminophen (NORCO/VICODIN) 5-325 MG per tablet Take 1 tablet by  mouth every 6 (six) hours as needed for pain.  90 tablet  0  . hydrocortisone cream 0.5 % Apply topically 2 (two) times daily as needed. Apply to axilla rash twice daily as needed.      . lidocaine-prilocaine (EMLA) cream Apply topically as needed. Apply to [facility] site one hour prior to needle stick to numb skin.  30 g  3  . LORazepam (ATIVAN) 0.5 MG tablet DISSOLVE 1 TABLET UNDER THE TONGUE EVERY 6 HOURS AS NEEDED FOR ANXIETY OR FOR NAUSEA AND VOMITING  60 tablet  0  . Nutritional Supplements (FEEDING SUPPLEMENT, OSMOLITE 1.5 CAL,) LIQD Begin Osmolite 1.5 at 60 ml/hr for 12 hours nocturnal continuous feeding daily. Flush with 60 ml free water before and after continuous feedings. Continue 1 can of Osmolite 1.5 via gravity feeding TID with 60 ml free water before and after each bolus feeding. Please send pump to patient.  1422 mL  0  . omeprazole (PRILOSEC) 10 MG capsule Take 10 mg by mouth daily.      . ondansetron (ZOFRAN) 8 MG tablet Take 8 mg by mouth every 12 (twelve) hours as needed.      . prochlorperazine (COMPAZINE) 10 MG tablet Take 1 tablet (10 mg total) by mouth every 6 (six) hours as needed.  60 tablet  1  . sertraline (ZOLOFT) 50 MG tablet Take 1.5 tablets (75 mg total) by mouth daily.  60 tablet  0  . sodium fluoride (PREVIDENT 5000 PLUS) 1.1 % CREA dental cream Apply thin ribbon of cream to tooth brush. Brush teeth for 2 minutes. Spit out excess-DO NOT swallow. DO NOT rinse afterwards. Repeat nightly.  1 Tube  prn   No current facility-administered medications for this visit.   Allergies:  Allergies  Allergen Reactions  . Codeine Phosphate     REACTION: rash, swelling  . Morphine And Related   . Tape     Blisters; please use paper tape    Past Medical History, Surgical history, Social history, and Family History were reviewed and updated.  Review of Systems: Constitutional:  Negative for fever, chills, night sweats, anorexia, weight loss, 7 out of 10 pain to left rib cage, neck and left scapular.. Cardiovascular: no chest pain or dyspnea on exertion Respiratory: no cough, shortness of breath, or wheezing Neurological: no TIA or stroke symptoms Dermatological: positive for rash negative for lumps ENT: negative for - oral  lesions Skin: Negative. Gastrointestinal: no abdominal pain, change in bowel habits, or black or bloody stools positive for - G tube Genito-Urinary: no dysuria, trouble voiding, or hematuria Hematological and Lymphatic: negative for - bleeding problems Musculoskeletal: negative for - gait disturbance Remaining ROS negative.  Physical Exam: BP 128/74  Pulse 85  Temp(Src) 98.1 F (36.7 C) (Oral)  Resp 20  Ht 6' (1.829 m)  Wt 165 lb 9.6 oz (75.116 kg)  BMI 22.45 kg/m2 ECOG: 0 General appearance: alert, cooperative, appears stated age and no distress Head: Normocephalic, without obvious abnormality, atraumatic HEENT: Sclerae anicteric. Conjuncitvae pink. PERRLA; Oral mucosa moist without ulceration or thrush.  Neck: + cervical adenopathy about 1 cm rubbery and non-tender, supple, symmetrical, trachea midline and thyroid not enlarged, symmetric, no tenderness/mass/nodules. No sumbandibular adenopathy. S/p major neck surgery with neck scars well healed.  Lymph nodes: Cervical adenopathy: as noted above.  Supraclavicular adenopathy: None appreciated; No  Heart:regular rate and rhythm, S1, S2 normal, no murmur, click, rub or gallop Lung:chest clear, no wheezing, rales, normal symmetric air entry, Heart  exam - S1, S2 normal, no murmur, no gallop, rate regular Abdomin: soft, non-tender, without masses or organomegaly + G tube without signs of infections.  EXT: No peripheral edema; positive tenderness over left scapular, left chest wall.  Skin: Rash on face, back, chest with erythema. + Left Chest port-a-cath without tenderness or fluctuance   Lab Results: CBC    Component Value Date/Time   WBC 6.2 06/24/2013 0846   WBC 1.8* 08/20/2012 0846   RBC 3.04* 06/24/2013 0846   RBC 2.97* 08/20/2012 0846   HGB 9.4* 06/24/2013 0846   HGB 10.1* 08/20/2012 0846   HCT 29.8* 06/24/2013 0846   HCT 30.0* 08/20/2012 0846   PLT 225 06/24/2013 0846   PLT 99* 08/20/2012 0846   MCV 98.0 06/24/2013 0846   MCV  101.0* 08/20/2012 0846   MCH 30.9 06/24/2013 0846   MCH 34.0 08/20/2012 0846   MCHC 31.5* 06/24/2013 0846   MCHC 33.7 08/20/2012 0846   RDW 15.3* 06/24/2013 0846   RDW 15.9* 08/20/2012 0846   LYMPHSABS 0.6* 06/24/2013 0846   LYMPHSABS 0.4* 08/20/2012 0846   MONOABS 0.6 06/24/2013 0846   MONOABS 0.1 08/20/2012 0846   EOSABS 0.2 06/24/2013 0846   EOSABS 0.1 08/20/2012 0846   BASOSABS 0.0 06/24/2013 0846   BASOSABS 0.0 08/20/2012 0846    CMP     Component Value Date/Time   NA 139 06/10/2013 1143   NA 138 06/25/2012 0858   K 3.9 06/10/2013 1143   K 3.1* 06/25/2012 0858   CL 102 02/25/2013 0753   CL 99 06/25/2012 0858   CO2 30* 06/10/2013 1143   CO2 30 06/25/2012 0858   GLUCOSE 109 06/10/2013 1143   GLUCOSE 86 02/25/2013 0753   GLUCOSE 88 06/25/2012 0858   BUN 10.5 06/10/2013 1143   BUN 10 06/25/2012 0858   CREATININE 0.7 06/10/2013 1143   CREATININE 0.47* 06/25/2012 0858   CALCIUM 9.4 06/10/2013 1143   CALCIUM 9.6 06/25/2012 0858   PROT 6.9 06/10/2013 1143   PROT 6.8 05/03/2012 0905   ALBUMIN 3.0* 06/10/2013 1143   ALBUMIN 3.8 05/03/2012 0905   AST 18 06/10/2013 1143   AST 14 05/03/2012 0905   ALT 13 06/10/2013 1143   ALT 11 05/03/2012 0905   ALKPHOS 52 06/10/2013 1143   ALKPHOS 54 05/03/2012 0905   BILITOT 0.29 06/10/2013 1143   BILITOT 0.3 05/03/2012 0905   GFRNONAA >90 12/29/2011 0350   GFRAA >90 12/29/2011 0350    Radiological Studies: CT CHEST, ABDOMEN AND PELVIS WITH CONTRAST (05/16/2013) Technique: Multidetector CT imaging of the chest, abdomen and pelvis was performed following the standard protocol during bolus administration of intravenous contrast.  Contrast: OMNIPAQUE IOHEXOL 300 MG/ML SOLN  Comparison: 03/01/2013  CT CHEST  Findings: 4.3 x 3.2 cm cavitary lesion in the superior segment left lower lobe (series 5/image 24), previously 3.5 x 2.6 cm.  Increasing fluid within the lesion.  Moderate paraseptal emphysematous changes in the upper lobes.  No new/suspicious  pulmonary nodules. No pleural effusion or pneumothorax.  Postprocedural changes in the lower left neck.  The visualized thyroid is unremarkable.  The heart is top normal in size. No pericardial effusion.  Left chest port.  No suspicious mediastinal, hilar, or axillary lymphadenopathy.  Surgical clips in the left axilla.  Mild degenerative changes of the thoracic spine.  IMPRESSION:  4.3 x 3.2 cm cavitary lesion/metastasis in the superior segment left lower lobe, mildly increased.  Moderate paraseptal emphysematous changes.  CT ABDOMEN AND PELVIS  Findings:  Gastrostomy tube in satisfactory position.  Liver, pancreas, and adrenal glands are within normal limits.  5.9 x 4.1 cm rim calcified, multiloculated lesion in the medial spleen (series 2/image 63), unchanged.  Suspected cholelithiasis (series 2/image 75), without associated inflammatory changes.  1.5 x 1.2 cm medial right upper pole renal cyst (series 2/image  71). Left kidney is within normal limits. No hydronephrosis.  No evidence of bowel obstruction. Normal appendix.  Atherosclerotic calcifications of the abdominal aorta and branch  vessels.  No abdominopelvic ascites.  No suspicious abdominopelvic lymphadenopathy.  Prostate is at the upper limits of normal for size.  Bladder is within normal limits.  Degenerative changes of the lumbar spine. Known metastasis in the  left iliac bone is not evident on CT.  IMPRESSION:  Known metastasis in the left iliac bone is not evident on CT.  No evidence of new/progressive metastatic disease in the abdomen/pelvis.  Additional stable ancillary findings as above.  Impression and Plan: ASSESSMENT AND PLAN: 1. Recurrent left lateral tongue squamous cell carcinoma: He now has metastatic disease to the lung and bone.  -His restaging CT from 02/18/13 showed enlargement of the left cavitary lesion. There was no convincing evidence of disease progression anywhere else.  - He was evaluated at [facility]  to see if he is a candidate for any clinical trial. He will follow up with Dr. Madilyn Fireman on 09/27.  - We discussed with patient and his son change his treatment with Cetuximab to 150 mg/m2 weekly and patient agrees to continue. - We will proceed with another treatment of Carboplatin/Taxol/Erbitux today.  Handout detailing chemotherapy provided.  Patient instructed to watch for fever greater than 100.5 and call MD on call or report to the ER.  2. Positional chest pain at surgical site. Reviewed CT scan of neck, chest, abdomen and pelvis obtained on 05/16/13. Mild progression of 4.3 x 3.2 cm cavitary lesion/metastasis in the superior segment left lower lobe representing stable disease.  Given persistence in symptoms requiring escalating narcotic and adjuvant pain meds, we will perform an MRI to exclude bone metastases.  If bone metastases present, we will refer for pallative XRT.   -- Last visit, we Increased Neurontin to 300 mg in morning, 300 mg in afternoon, and 900 mg q hs.  Counseled on common side-effects include sedation and effects on kidney function.  We will monitor closely.  Since pain is not resolved, we will also increase Fentanyl patch to 50 mcg/hr q 3 days. Prescription provided 3. Skin rash secondary to Erbitux, stable.  He is on Cleocin cream and Hydrocortisone cream prn.  4. 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  5. Bone met: limited. S/p palliative XRT. He has been on Zometa every 6 weeks. Zometa given on 05/14/2013 and will be given today (06/24/2013). 6. Anemia, gade 1 : This is from chemo. There is no active bleeding. No transfusion indicated.  7. Smoking: none at this time.  8. Follow up: in 3 weeks  With labs and chemotherapy.   All questions were answered. The patient knows to call the clinic with any problems, questions or concerns. We can certainly see the patient much sooner if necessary.  Patient provided prescriptions for ativan,  neurontin and vicodin.   Patient also was provided an after visit clinical summary.    I spent 15 minutes or more than half the time coordinating care during 25 minute follow-up visit.    Jourden Delmont, MD 06/24/2013 10:32

## 2013-06-25 ENCOUNTER — Encounter: Payer: Self-pay | Admitting: Internal Medicine

## 2013-06-26 ENCOUNTER — Telehealth: Payer: Self-pay | Admitting: Internal Medicine

## 2013-06-26 ENCOUNTER — Other Ambulatory Visit: Payer: Self-pay | Admitting: Internal Medicine

## 2013-06-26 NOTE — Telephone Encounter (Signed)
I spoke with patient regarding his CT chest findings in August 2013.   He was concerned about the small amount of gas in the mediatinum.  I reviewed his most recent scan personally, and this was not mentioned and observed on his these scans.  His has had a recent increase in his pain.  I reviewed the lesion in his left lung which may involve the costachondral spaces and bones.   I will involve radiation oncology to determine if he is a candidate for palliative XRT to this area.    Patient is scheduled for an MRI on 10/22.

## 2013-07-01 ENCOUNTER — Other Ambulatory Visit (HOSPITAL_BASED_OUTPATIENT_CLINIC_OR_DEPARTMENT_OTHER): Payer: Medicaid Other | Admitting: Lab

## 2013-07-01 ENCOUNTER — Other Ambulatory Visit: Payer: Self-pay | Admitting: Internal Medicine

## 2013-07-01 ENCOUNTER — Other Ambulatory Visit: Payer: Self-pay | Admitting: Medical Oncology

## 2013-07-01 ENCOUNTER — Ambulatory Visit (HOSPITAL_BASED_OUTPATIENT_CLINIC_OR_DEPARTMENT_OTHER): Payer: Medicaid Other

## 2013-07-01 VITALS — BP 130/66 | HR 78 | Temp 98.3°F | Resp 18

## 2013-07-01 DIAGNOSIS — C029 Malignant neoplasm of tongue, unspecified: Secondary | ICD-10-CM

## 2013-07-01 DIAGNOSIS — C7951 Secondary malignant neoplasm of bone: Secondary | ICD-10-CM

## 2013-07-01 DIAGNOSIS — Z5112 Encounter for antineoplastic immunotherapy: Secondary | ICD-10-CM

## 2013-07-01 DIAGNOSIS — C770 Secondary and unspecified malignant neoplasm of lymph nodes of head, face and neck: Secondary | ICD-10-CM

## 2013-07-01 DIAGNOSIS — Z5111 Encounter for antineoplastic chemotherapy: Secondary | ICD-10-CM

## 2013-07-01 DIAGNOSIS — C7800 Secondary malignant neoplasm of unspecified lung: Secondary | ICD-10-CM

## 2013-07-01 LAB — COMPREHENSIVE METABOLIC PANEL (CC13)
Albumin: 3.2 g/dL — ABNORMAL LOW (ref 3.5–5.0)
Anion Gap: 9 mEq/L (ref 3–11)
BUN: 8.1 mg/dL (ref 7.0–26.0)
CO2: 27 mEq/L (ref 22–29)
Glucose: 83 mg/dl (ref 70–140)
Potassium: 3.8 mEq/L (ref 3.5–5.1)
Sodium: 138 mEq/L (ref 136–145)
Total Protein: 7.2 g/dL (ref 6.4–8.3)

## 2013-07-01 LAB — CBC WITH DIFFERENTIAL/PLATELET
BASO%: 0.4 % (ref 0.0–2.0)
Basophils Absolute: 0 10*3/uL (ref 0.0–0.1)
Eosinophils Absolute: 0.3 10*3/uL (ref 0.0–0.5)
HCT: 29.7 % — ABNORMAL LOW (ref 38.4–49.9)
HGB: 9.4 g/dL — ABNORMAL LOW (ref 13.0–17.1)
LYMPH%: 17.2 % (ref 14.0–49.0)
MCH: 30.6 pg (ref 27.2–33.4)
MCV: 96.7 fL (ref 79.3–98.0)
MONO%: 10.2 % (ref 0.0–14.0)
NEUT#: 3.3 10*3/uL (ref 1.5–6.5)
NEUT%: 66.5 % (ref 39.0–75.0)
Platelets: 244 10*3/uL (ref 140–400)
RBC: 3.07 10*6/uL — ABNORMAL LOW (ref 4.20–5.82)
RDW: 15.1 % — ABNORMAL HIGH (ref 11.0–14.6)

## 2013-07-01 MED ORDER — ONDANSETRON 16 MG/50ML IVPB (CHCC)
16.0000 mg | Freq: Once | INTRAVENOUS | Status: AC
  Administered 2013-07-01: 16 mg via INTRAVENOUS

## 2013-07-01 MED ORDER — CETUXIMAB CHEMO IV INJECTION 200 MG/100ML
150.0000 mg/m2 | Freq: Once | INTRAVENOUS | Status: AC
  Administered 2013-07-01: 300 mg via INTRAVENOUS
  Filled 2013-07-01: qty 150

## 2013-07-01 MED ORDER — DEXAMETHASONE SODIUM PHOSPHATE 20 MG/5ML IJ SOLN
INTRAMUSCULAR | Status: AC
  Filled 2013-07-01: qty 5

## 2013-07-01 MED ORDER — PACLITAXEL CHEMO INJECTION 300 MG/50ML
48.0000 mg/m2 | Freq: Once | INTRAVENOUS | Status: AC
  Administered 2013-07-01: 90 mg via INTRAVENOUS
  Filled 2013-07-01: qty 15

## 2013-07-01 MED ORDER — SODIUM CHLORIDE 0.9 % IV SOLN
Freq: Once | INTRAVENOUS | Status: AC
  Administered 2013-07-01: 10:00:00 via INTRAVENOUS

## 2013-07-01 MED ORDER — SODIUM CHLORIDE 0.9 % IJ SOLN
10.0000 mL | INTRAMUSCULAR | Status: DC | PRN
  Administered 2013-07-01: 10 mL
  Filled 2013-07-01: qty 10

## 2013-07-01 MED ORDER — HEPARIN SOD (PORK) LOCK FLUSH 100 UNIT/ML IV SOLN
500.0000 [IU] | Freq: Once | INTRAVENOUS | Status: AC | PRN
  Administered 2013-07-01: 500 [IU]
  Filled 2013-07-01: qty 5

## 2013-07-01 MED ORDER — DEXAMETHASONE SODIUM PHOSPHATE 20 MG/5ML IJ SOLN
20.0000 mg | Freq: Once | INTRAMUSCULAR | Status: AC
  Administered 2013-07-01: 20 mg via INTRAVENOUS

## 2013-07-01 MED ORDER — CARBOPLATIN CHEMO INTRADERMAL TEST DOSE 100MCG/0.02ML
100.0000 ug | Freq: Once | INTRADERMAL | Status: AC
  Administered 2013-07-01: 100 ug via INTRADERMAL
  Filled 2013-07-01: qty 0.01

## 2013-07-01 MED ORDER — FAMOTIDINE IN NACL 20-0.9 MG/50ML-% IV SOLN
20.0000 mg | Freq: Once | INTRAVENOUS | Status: AC
  Administered 2013-07-01: 20 mg via INTRAVENOUS

## 2013-07-01 MED ORDER — SODIUM CHLORIDE 0.9 % IV SOLN
180.0000 mg | Freq: Once | INTRAVENOUS | Status: AC
  Administered 2013-07-01: 180 mg via INTRAVENOUS
  Filled 2013-07-01: qty 18

## 2013-07-01 MED ORDER — DIPHENHYDRAMINE HCL 50 MG/ML IJ SOLN: 50.0000 mg | Freq: Once | INTRAMUSCULAR | Status: DC

## 2013-07-01 MED ORDER — DIPHENHYDRAMINE HCL 50 MG/ML IJ SOLN
INTRAMUSCULAR | Status: AC
  Filled 2013-07-01: qty 1

## 2013-07-01 MED ORDER — FAMOTIDINE IN NACL 20-0.9 MG/50ML-% IV SOLN
INTRAVENOUS | Status: AC
  Filled 2013-07-01: qty 50

## 2013-07-01 MED ORDER — ONDANSETRON 16 MG/50ML IVPB (CHCC)
INTRAVENOUS | Status: AC
  Filled 2013-07-01: qty 16

## 2013-07-01 MED ORDER — DIPHENHYDRAMINE HCL 50 MG/ML IJ SOLN
50.0000 mg | Freq: Once | INTRAMUSCULAR | Status: AC
  Administered 2013-07-01: 50 mg via INTRAVENOUS

## 2013-07-01 NOTE — Progress Notes (Signed)
1610 Skin test initiated  0952 skin test unremarkable at 5 minutes 1002 skin test unremarkable at 15 minutes. 1022 sin test unremarkable at 30 minutes.

## 2013-07-01 NOTE — Patient Instructions (Signed)
[facility] Discharge Instructions for Patients Receiving Chemotherapy  Today you received the following chemotherapy agents Erbitux, Taxol and Carboplatin.  To help prevent nausea and vomiting after your treatment, we encourage you to take your nausea medication.   If you develop nausea and vomiting that is not controlled by your nausea medication, call the clinic.   BELOW ARE SYMPTOMS THAT SHOULD BE REPORTED IMMEDIATELY:  *FEVER GREATER THAN 100.5 F  *CHILLS WITH OR WITHOUT FEVER  NAUSEA AND VOMITING THAT IS NOT CONTROLLED WITH YOUR NAUSEA MEDICATION  *UNUSUAL SHORTNESS OF BREATH  *UNUSUAL BRUISING OR BLEEDING  TENDERNESS IN MOUTH AND THROAT WITH OR WITHOUT PRESENCE OF ULCERS  *URINARY PROBLEMS  *BOWEL PROBLEMS  UNUSUAL RASH Items with * indicate a potential emergency and should be followed up as soon as possible.  Feel free to call the clinic you have any questions or concerns. The clinic phone number is [phone].    

## 2013-07-03 ENCOUNTER — Other Ambulatory Visit: Payer: Self-pay | Admitting: Internal Medicine

## 2013-07-03 ENCOUNTER — Ambulatory Visit (HOSPITAL_COMMUNITY)
Admission: RE | Admit: 2013-07-03 | Discharge: 2013-07-03 | Disposition: A | Discharge status: Home / Self Care | Payer: Medicaid Other | Source: Ambulatory Visit | Attending: Internal Medicine | Admitting: Internal Medicine

## 2013-07-03 ENCOUNTER — Encounter: Payer: Self-pay | Admitting: Radiation Oncology

## 2013-07-03 DIAGNOSIS — C770 Secondary and unspecified malignant neoplasm of lymph nodes of head, face and neck: Secondary | ICD-10-CM

## 2013-07-03 DIAGNOSIS — C7951 Secondary malignant neoplasm of bone: Secondary | ICD-10-CM

## 2013-07-03 DIAGNOSIS — C029 Malignant neoplasm of tongue, unspecified: Secondary | ICD-10-CM

## 2013-07-03 DIAGNOSIS — D7389 Other diseases of spleen: Secondary | ICD-10-CM | POA: Insufficient documentation

## 2013-07-03 DIAGNOSIS — M5030 Other cervical disc degeneration, unspecified cervical region: Secondary | ICD-10-CM | POA: Insufficient documentation

## 2013-07-03 DIAGNOSIS — K8020 Calculus of gallbladder without cholecystitis without obstruction: Secondary | ICD-10-CM | POA: Insufficient documentation

## 2013-07-03 DIAGNOSIS — C50919 Malignant neoplasm of unspecified site of unspecified female breast: Secondary | ICD-10-CM | POA: Insufficient documentation

## 2013-07-03 MED ORDER — GADOBENATE DIMEGLUMINE 529 MG/ML IV SOLN
15.0000 mL | Freq: Once | INTRAVENOUS | Status: AC | PRN
  Administered 2013-07-03: 15 mL via INTRAVENOUS

## 2013-07-03 NOTE — Progress Notes (Signed)
Histology and Location of Primary Cancer: oral tongue  Sites of Visceral and Bony Metastatic Disease: left 5th rib, adjacent 4th and 5th  Intercostal spaces  Location(s) of Symptomatic Metastases:   Past/Anticipated chemotherapy by medical oncology, if any: Current therapy: Started on 05/15/2012 palliative chemo weekly Carboplatin/Taxol/Erbitux; 3 weeks on; 1 week off. Due to prolonged cytopenia, his chemo has been changed to weekly; 2 weeks on, 1 week off. He is on zometa q 6 weeks (last dose 05/14/2013)  Pain on a scale of 0-10 is:  4, on duragisic 50 mcg patch and takes hydrocodone 5/325 every 4 hours.  If Spine Met(s), symptoms, if any, include:  Bowel/Bladder retention or incontinence (please describe): none  Numbness or weakness in extremities (please describe): none  Current Decadron regimen, if applicable: none  Ambulatory status:walks without assistance  SAFETY ISSUES:  Prior radiation? Yes, oral tongue in 11/2011, left iliac bone in 07/2012  Pacemaker/ICD? no  Possible current pregnancy? na  Is the patient on methotrexate? no  Current Complaints / other details:  Pain in left rib cage, neck and upper chest w/radiation to his left scapular area since 06/03/13.Patient has been in pain for about 2 months which has become progressively worse.Hydrocodone 5/325 is not always effective with 1 pill so he has to take 2.To review MR Cervical spine and chest.

## 2013-07-04 ENCOUNTER — Ambulatory Visit
Admission: RE | Admit: 2013-07-04 | Discharge: 2013-07-04 | Disposition: A | Discharge status: Home / Self Care | Payer: Medicaid Other | Source: Ambulatory Visit | Attending: Radiation Oncology | Admitting: Radiation Oncology

## 2013-07-04 VITALS — BP 122/80 | HR 81 | Temp 98.1°F | Wt 164.0 lb

## 2013-07-04 DIAGNOSIS — Z9221 Personal history of antineoplastic chemotherapy: Secondary | ICD-10-CM | POA: Insufficient documentation

## 2013-07-04 DIAGNOSIS — C50919 Malignant neoplasm of unspecified site of unspecified female breast: Secondary | ICD-10-CM | POA: Insufficient documentation

## 2013-07-04 DIAGNOSIS — C069 Malignant neoplasm of mouth, unspecified: Secondary | ICD-10-CM | POA: Insufficient documentation

## 2013-07-04 DIAGNOSIS — R071 Chest pain on breathing: Secondary | ICD-10-CM | POA: Insufficient documentation

## 2013-07-04 DIAGNOSIS — C7800 Secondary malignant neoplasm of unspecified lung: Secondary | ICD-10-CM | POA: Insufficient documentation

## 2013-07-04 DIAGNOSIS — C770 Secondary and unspecified malignant neoplasm of lymph nodes of head, face and neck: Secondary | ICD-10-CM

## 2013-07-04 NOTE — Progress Notes (Signed)
Please see the Nurse Progress Note in the MD Initial Consult Encounter for this patient. 

## 2013-07-04 NOTE — Progress Notes (Signed)
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 He saw medical oncology who encouraged him to contact us for palliative radiation. He has been experiencing increasing left chest wall pain over the past several months. At first he was able to control this with ibuprofen but now is on 50 mcg of Duragesic and is taking 1-2 Percocet every 4 hours as needed. He is also not able to lay on his left side and is sleeping mostly on his right side. He also complains of symptoms of burning on his scan and a feeling of numbness or burning. At times he feels as though he has neck pain as well. He has been on chemotherapy and had his last treatment on Monday of this week. He is scheduled for a second opinion at [facility] on Monday of next week. He is accompanied by his father today. He has not noticed any respiratory symptoms. He specifically denies any increasing dyspnea on exertion or hemoptysis. He had an MRI of the cervical spine and chest on 07/03/2013. The cervical spine showed degenerative change with no evidence of metastatic disease. The MRI of the chest showed a 5.5 x 5.6 left lower lobe mass invading the left chest wall into the fifth and fourth rib. Enhancement extended into the intercostal space between the fifth and sixth ribs.  Allergies:  Allergies  Allergen Reactions  . Codeine Phosphate     REACTION: rash, swelling  . Morphine And Related   . Tape     Blisters; please use paper tape    Medications:  Current Outpatient Prescriptions  Medication  Sig Dispense Refill  . clindamycin (CLINDAGEL) 1 % gel Apply topically as needed. Apply to facial and chest skin rash.  30 g  3  . fentaNYL (DURAGESIC - DOSED MCG/HR) 50 MCG/HR Place 1 patch (50 mcg total) onto the skin every 3 (three) days.  10 patch  0  . gabapentin (NEURONTIN) 300 MG capsule Take 1 capsule (300 mg total) by mouth 3 (three) times daily. Take one capsule in the am, 2 capsules at noon and 3 capsules at bedtime.  90 capsule  0  . HYDROcodone-acetaminophen (NORCO/VICODIN) 5-325 MG per tablet Take 1 tablet by mouth every 4 (four) hours as needed for pain.  90 tablet  0  . lidocaine-prilocaine (EMLA) cream Apply topically as needed. Apply to [facility] site one hour prior to needle stick to numb skin.  30 g  3  . LORazepam (ATIVAN) 0.5 MG tablet Take 1 tablet (0.5 mg total) by mouth every 6 (six) hours as needed for anxiety.  60 tablet  0  . Nutritional Supplements (FEEDING SUPPLEMENT, OSMOLITE 1.5 CAL,) LIQD Begin Osmolite 1.5 at 60 ml/hr for 12 hours nocturnal continuous feeding daily. Flush with 60 ml free water before and after continuous feedings. Continue 1 can of Osmolite 1.5 via gravity feeding TID with 60 ml free water before and after each bolus feeding. Please send pump to patient.  1422 mL  0  . omeprazole (PRILOSEC) 10 MG capsule Take 10 mg by  mouth daily.      . ondansetron (ZOFRAN) 8 MG tablet Take 8 mg by mouth every 12 (twelve) hours as needed.      . polyethylene glycol (MIRALAX / GLYCOLAX) packet Take 17 g by mouth 3 (three) times a week.      . prochlorperazine (COMPAZINE) 10 MG tablet Take 1 tablet (10 mg total) by mouth every 6 (six) hours as needed.  60 tablet  1  . sertraline (ZOLOFT) 50 MG tablet Take 1.5 tablets (75 mg total) by mouth daily.  60 tablet  0   No current facility-administered medications for this encounter.    Physical Exam:  Filed Vitals:   07/04/13 0927  BP: 122/80  Pulse: 81  Temp: 98.1 F (36.7 C)   he is a pleasant male in no distress  sitting comfortably on examining table.  IMPRESSION: Thomas Bean is a 51 y.o. male with chest wall invasion from a pulmonary metastases.  PLAN:  I explained the role of palliative radiation to Mr. Wilkie and his father. He had a good response to radiation with his previous treatment. We discussed that radiation was used not for survival benefit but to control pain. Our goal would be to get him on a lower dose of narcotics are completely off of narcotics. I would not want to him per his enrollment in a clinical trial so I have scheduled him for simulation next Tuesday which is after he meets with medical oncology at [facility]. I would like to plan on 10 fractions to decrease his possible skin toxicity however this could be decreased to 5 fractions with the higher dose per fraction if needed for enrollment in clinical trial. I've asked him to call me if he does need to be enrolled in a clinical trial we can certainly shorten his fractionation. I encouraged him to joining are living with cancer support group. He seemed very interested in this. I also gave him the [employer] website to begin investigate clinical trials that he might be interested in as well.    Lurline Hare, MD

## 2013-07-04 NOTE — Addendum Note (Signed)
Encounter addended by: Tessa Lerner, RN on: 07/04/2013  4:06 PM<BR>     Documentation filed: Charges VN

## 2013-07-05 ENCOUNTER — Ambulatory Visit (INDEPENDENT_AMBULATORY_CARE_PROVIDER_SITE_OTHER): Payer: Medicaid Other | Admitting: Internal Medicine

## 2013-07-05 ENCOUNTER — Encounter: Payer: Self-pay | Admitting: Internal Medicine

## 2013-07-05 ENCOUNTER — Other Ambulatory Visit: Payer: Self-pay

## 2013-07-05 VITALS — BP 120/78 | HR 101 | Temp 98.9°F | Wt 161.0 lb

## 2013-07-05 DIAGNOSIS — C770 Secondary and unspecified malignant neoplasm of lymph nodes of head, face and neck: Secondary | ICD-10-CM

## 2013-07-05 DIAGNOSIS — C029 Malignant neoplasm of tongue, unspecified: Secondary | ICD-10-CM

## 2013-07-05 DIAGNOSIS — C7951 Secondary malignant neoplasm of bone: Secondary | ICD-10-CM

## 2013-07-05 DIAGNOSIS — Z23 Encounter for immunization: Secondary | ICD-10-CM

## 2013-07-05 MED ORDER — HYDROCODONE-ACETAMINOPHEN 5-325 MG PO TABS: ORAL_TABLET | ORAL | Status: DC

## 2013-07-05 MED ORDER — SERTRALINE HCL 100 MG PO TABS: 100.0000 mg | ORAL_TABLET | Freq: Every day | ORAL | Status: AC

## 2013-07-05 MED ORDER — SERTRALINE HCL 100 MG PO TABS: 100.0000 mg | ORAL_TABLET | Freq: Every day | ORAL | Status: DC

## 2013-07-05 MED ORDER — LORAZEPAM 0.5 MG PO TABS: 0.5000 mg | ORAL_TABLET | Freq: Four times a day (QID) | ORAL | Status: DC | PRN

## 2013-07-05 MED ORDER — GABAPENTIN 300 MG PO CAPS: ORAL_CAPSULE | ORAL | Status: DC

## 2013-07-05 NOTE — Telephone Encounter (Signed)
Pt called requesting refill on norco and gabapentin. S/w dr Rosie Fate and reordered both

## 2013-07-05 NOTE — Progress Notes (Signed)
Complicated patient- Head and neck CA- -recurrent left lateral tongue SCCa. Has rapidly enlarging chest mass- to be evaluated at [facility] or [facility]  Mood-- much better with zoloft. No side effects   A/p- mood disorder - much improved despite worsening cancer prognosis- continue same meds See me prn  I refilled lorazepam too

## 2013-07-08 ENCOUNTER — Ambulatory Visit: Payer: Self-pay

## 2013-07-09 ENCOUNTER — Ambulatory Visit
Admission: RE | Admit: 2013-07-09 | Discharge: 2013-07-09 | Disposition: A | Discharge status: Home / Self Care | Payer: Medicaid Other | Source: Ambulatory Visit | Attending: Radiation Oncology | Admitting: Radiation Oncology

## 2013-07-09 DIAGNOSIS — C029 Malignant neoplasm of tongue, unspecified: Secondary | ICD-10-CM

## 2013-07-09 DIAGNOSIS — Z510 Encounter for antineoplastic radiation therapy: Secondary | ICD-10-CM | POA: Insufficient documentation

## 2013-07-09 DIAGNOSIS — C7800 Secondary malignant neoplasm of unspecified lung: Secondary | ICD-10-CM | POA: Insufficient documentation

## 2013-07-09 DIAGNOSIS — R52 Pain, unspecified: Secondary | ICD-10-CM | POA: Insufficient documentation

## 2013-07-09 DIAGNOSIS — Z79899 Other long term (current) drug therapy: Secondary | ICD-10-CM | POA: Insufficient documentation

## 2013-07-09 NOTE — Progress Notes (Signed)
[facility] Radiation Oncology Simulation and Treatment Planning Note   Name: SALATHIEL FERRARA MRN: [id number]  Date: 07/09/2013  DOB: 1961-04-17  Status: outpatient    DIAGNOSIS: Metastatic tongue cancer to lung.    SIDE: left   CONSENT VERIFIED: yes   SET UP AND IMMOBILIZATION: Patient is setup supine with arms in a wing board.   NARRATIVE: The patient was brought to the CT Simulation planning suite.  Identity was confirmed.  All relevant records and images related to the planned course of therapy were reviewed.  Then, the patient was positioned in a stable reproducible clinical set-up for radiation therapy.  CT images were obtained.  Skin markings were placed.  The CT images were loaded into the planning software where the target and avoidance structures were contoured.  The radiation prescription was entered and confirmed.   TREATMENT PLANNING NOTE:  Treatment planning then occurred. I have requested 3D simulation with [facility] of the spinal cord, total lungs and gross tumor volume. I have also requested mlcs and an isodose plan.   I personally supervised and approved the construction of 3 medically necessary complex treatment devices in the form on MLCs which will be used for beam shaping.

## 2013-07-12 ENCOUNTER — Other Ambulatory Visit: Payer: Self-pay | Admitting: Internal Medicine

## 2013-07-12 ENCOUNTER — Ambulatory Visit (HOSPITAL_BASED_OUTPATIENT_CLINIC_OR_DEPARTMENT_OTHER): Payer: Medicaid Other | Admitting: Internal Medicine

## 2013-07-12 ENCOUNTER — Other Ambulatory Visit (HOSPITAL_BASED_OUTPATIENT_CLINIC_OR_DEPARTMENT_OTHER): Payer: Medicaid Other | Admitting: Lab

## 2013-07-12 ENCOUNTER — Telehealth: Payer: Self-pay | Admitting: Internal Medicine

## 2013-07-12 VITALS — BP 125/76 | HR 96 | Temp 97.7°F | Resp 18 | Ht 72.0 in | Wt 168.0 lb

## 2013-07-12 DIAGNOSIS — C7951 Secondary malignant neoplasm of bone: Secondary | ICD-10-CM

## 2013-07-12 DIAGNOSIS — C029 Malignant neoplasm of tongue, unspecified: Secondary | ICD-10-CM

## 2013-07-12 DIAGNOSIS — C7800 Secondary malignant neoplasm of unspecified lung: Secondary | ICD-10-CM

## 2013-07-12 DIAGNOSIS — C770 Secondary and unspecified malignant neoplasm of lymph nodes of head, face and neck: Secondary | ICD-10-CM

## 2013-07-12 DIAGNOSIS — D630 Anemia in neoplastic disease: Secondary | ICD-10-CM

## 2013-07-12 LAB — CBC WITH DIFFERENTIAL/PLATELET
Basophils Absolute: 0 10*3/uL (ref 0.0–0.1)
Eosinophils Absolute: 0.2 10*3/uL (ref 0.0–0.5)
HGB: 9.1 g/dL — ABNORMAL LOW (ref 13.0–17.1)
LYMPH%: 15.2 % (ref 14.0–49.0)
MCV: 94.8 fL (ref 79.3–98.0)
MONO#: 0.5 10*3/uL (ref 0.1–0.9)
MONO%: 9.5 % (ref 0.0–14.0)
NEUT#: 3.7 10*3/uL (ref 1.5–6.5)
NEUT%: 71.4 % (ref 39.0–75.0)
Platelets: 245 10*3/uL (ref 140–400)
RBC: 2.94 10*6/uL — ABNORMAL LOW (ref 4.20–5.82)
WBC: 5.1 10*3/uL (ref 4.0–10.3)
lymph#: 0.8 10*3/uL — ABNORMAL LOW (ref 0.9–3.3)

## 2013-07-12 LAB — COMPREHENSIVE METABOLIC PANEL (CC13)
Alkaline Phosphatase: 60 U/L (ref 40–150)
BUN: 6.7 mg/dL — ABNORMAL LOW (ref 7.0–26.0)
Chloride: 103 mEq/L (ref 98–109)
Glucose: 102 mg/dl (ref 70–140)
Total Bilirubin: 0.24 mg/dL (ref 0.20–1.20)

## 2013-07-12 MED ORDER — PROCHLORPERAZINE MALEATE 10 MG PO TABS: 10.0000 mg | ORAL_TABLET | Freq: Four times a day (QID) | ORAL | Status: AC | PRN

## 2013-07-12 MED ORDER — FENTANYL 50 MCG/HR TD PT72: 1.0000 | MEDICATED_PATCH | TRANSDERMAL | Status: DC

## 2013-07-12 NOTE — Telephone Encounter (Signed)
gv and prined appt sched and avs for pt pt wanted appt day after radiation fisished

## 2013-07-14 ENCOUNTER — Other Ambulatory Visit: Payer: Self-pay | Admitting: Internal Medicine

## 2013-07-14 DIAGNOSIS — C029 Malignant neoplasm of tongue, unspecified: Secondary | ICD-10-CM

## 2013-07-14 DIAGNOSIS — C7951 Secondary malignant neoplasm of bone: Secondary | ICD-10-CM

## 2013-07-14 NOTE — Progress Notes (Signed)
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 He developed biopsy proven metastatic disease in 03/2012 with met in right neck, left lung mass and left illac.   Prior Therapy:  1. 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 He developed recurrence in the left submandibular gland in August 2012. He underwent on 07/27/2011 radical left neck dissection by Dr. Ezzard Standing and Dr. Geryl Rankins. Pathology case number [id number] showed total 15 lymph nodes from left radical neck dissection that was all negative. However the left submandibular gland mass showed a 3.5 cm invasive SCC, moderately differentiates involving the adjacent skeletal muscle tissue and bone a tissue and extending into the inked margin. There was no evidence of any lymphatic invasion or perineural invasion. He underwent further resection by Dr. Mary Sella at [facility] on 09/29/2011.  2. He received adjuvant chemoradiation therapy with q 3 week Cisplatin given 11/15/11-12/05/11 (received 2 of 3 doses; 3rd dose held due to febrile neutropenia and development of flap abscess). He received concurrent XRT 11/15/11 through 01/05/12.  3. For right neck recurrence in 03/2012; he underwent palliative right neck dissection by Dr. Manson Passey at [facility].   Current therapy:  Started on 05/15/2012 palliative chemo weekly Carboplatin/Taxol/Erbitux; 3 weeks on; 1 week off. Due to prolonged cytopenia, his chemo has been changed to weekly; 2 weeks on, 1 week off.   He is on zometa q 6 weeks (last dose 06/24/2013). Palliative XRT to left chest wall planned for 07/13/2013.   Interim History:  Thomas Bean 52  y.o. male returns for regular follow up visit. He was seen by me on 06/24/2013.  Patient has been seen by Dr. Michell Heinrich for consideration of palliative XRT to his left chest wall based on his recent MRI of chest findings consistent with progressive disease with 5.5 x 5.6 left lower lobe mass invading the left chest wall into the fourth and fifth rib. He underwent simulation of 10/28.  In addition, he was seen by Dr. Elaina Pattee of [facility] 10/27 regarding review of his case for consideration of clinical trial enrollment.  He was also evaluated by his psychiatrist regarding his mood disorder with continuation of his zoloft and his lorazepam prn.  His pain is managed moderately with fentanyl 50 mcg/hr patch and gabapentin and hydrocodone 5/325 prn.  Today, he is accompanied by his father.  He tolerated his last chemotherapy without problems. He tolerates the osmolite 1.5 at 60 ml/hr for 12 hours nocturnal continuous feeding daily.   The patient denied fever, chills, night sweats, change in appetite or weight. He denied headaches, double vision, blurry vision, nasal congestion, nasal discharge, odynophagia or dysphagia. No palpitations, dyspnea, cough, abdominal pain,  vomiting, diarrhea, hematochezia. The patient denied dysuria, nocturia, polyuria, hematuria, myalgia, tingling.  Today, he request refills for fentanyl patch.   Medications: I have reviewed the patient's current medications.  Current Outpatient Prescriptions  Medication Sig Dispense Refill  . clindamycin (CLINDAGEL) 1 % gel Apply topically as needed. Apply to facial and chest skin rash.  30 g  3  . clobetasol cream (TEMOVATE) 0.05 % Apply topically as needed. Apply to rash BID      . fentaNYL (DURAGESIC - DOSED MCG/HR) 25 MCG/HR patch  Place 1 patch (25 mcg total) onto the skin every 3 (three) days.  10 patch  0  . gabapentin (NEURONTIN) 300 MG capsule Take 1 capsule (300 mg total) by mouth 3 (three) times daily.  90 capsule  3  .  HYDROcodone-acetaminophen (NORCO/VICODIN) 5-325 MG per tablet Take 1 tablet by mouth every 6 (six) hours as needed for pain.  90 tablet  0  . hydrocortisone cream 0.5 % Apply topically 2 (two) times daily as needed. Apply to axilla rash twice daily as needed.      . lidocaine-prilocaine (EMLA) cream Apply topically as needed. Apply to [facility] site one hour prior to needle stick to numb skin.  30 g  3  . LORazepam (ATIVAN) 0.5 MG tablet DISSOLVE 1 TABLET UNDER THE TONGUE EVERY 6 HOURS AS NEEDED FOR ANXIETY OR FOR NAUSEA AND VOMITING  60 tablet  0  . Nutritional Supplements (FEEDING SUPPLEMENT, OSMOLITE 1.5 CAL,) LIQD Begin Osmolite 1.5 at 60 ml/hr for 12 hours nocturnal continuous feeding daily. Flush with 60 ml free water before and after continuous feedings. Continue 1 can of Osmolite 1.5 via gravity feeding TID with 60 ml free water before and after each bolus feeding. Please send pump to patient.  1422 mL  0  . omeprazole (PRILOSEC) 10 MG capsule Take 10 mg by mouth daily.      . ondansetron (ZOFRAN) 8 MG tablet Take 8 mg by mouth every 12 (twelve) hours as needed.      . prochlorperazine (COMPAZINE) 10 MG tablet Take 1 tablet (10 mg total) by mouth every 6 (six) hours as needed.  60 tablet  1  . sertraline (ZOLOFT) 50 MG tablet Take 1.5 tablets (75 mg total) by mouth daily.  60 tablet  0  . sodium fluoride (PREVIDENT 5000 PLUS) 1.1 % CREA dental cream Apply thin ribbon of cream to tooth brush. Brush teeth for 2 minutes. Spit out excess-DO NOT swallow. DO NOT rinse afterwards. Repeat nightly.  1 Tube  prn   No current facility-administered medications for this visit.   Allergies:  Allergies  Allergen Reactions  . Codeine Phosphate     REACTION: rash, swelling  . Morphine And Related   . Tape     Blisters; please use paper tape    Past Medical History, Surgical history, Social history, and Family History were reviewed and updated.  Review of Systems: Constitutional:  Negative for  fever, chills, night sweats, anorexia, weight loss,5 out of 10 pain to left rib cage, neck and left scapular.. Cardiovascular: no chest pain or dyspnea on exertion Respiratory: no cough, shortness of breath, or wheezing Neurological: no TIA or stroke symptoms Dermatological: positive for rash negative for lumps ENT: negative for - oral lesions Skin: Negative. Gastrointestinal: no abdominal pain, change in bowel habits, or black or bloody stools positive for - G tube Genito-Urinary: no dysuria, trouble voiding, or hematuria Hematological and Lymphatic: negative for - bleeding problems Musculoskeletal: negative for - gait disturbance Remaining ROS negative.  Physical Exam: BP 125/76  Pulse 96  Temp(Src) 97.7 F (36.5 C) (Oral)  Resp 18  Ht 6' (1.829 m)  Wt 168 lb (76.204 kg)  BMI 22.78 kg/m2 ECOG: 0 General appearance: alert, cooperative, appears stated age and no distress Head: Normocephalic, without obvious abnormality, atraumatic HEENT: Sclerae anicteric. Conjuncitvae pink. PERRLA; Oral mucosa moist without ulceration or thrush.  Neck: + cervical adenopathy about 1 cm rubbery and non-tender, supple, symmetrical, trachea midline and thyroid not enlarged, symmetric, no  tenderness/mass/nodules. No subandibular adenopathy. S/p major neck surgery with neck scars well healed.  Lymph nodes: Cervical adenopathy: as noted above.  Supraclavicular adenopathy: None appreciated; No  Heart:regular rate and rhythm, S1, S2 normal, no murmur, click, rub or gallop Lung:chest clear, no wheezing, rales, normal symmetric air entry, Heart exam - S1, S2 normal, no murmur, no gallop, rate regular Abdomin: soft, non-tender, without masses or organomegaly + G tube without signs of infections.  EXT: No peripheral edema; positive tenderness over left scapular, left chest wall.  Skin: Rash on face, back, chest with erythema. + Left Chest port-a-cath without tenderness or fluctuance   Lab Results: CBC     Component Value Date/Time   WBC 5.1 07/12/2013 0941   WBC 1.8* 08/20/2012 0846   RBC 2.94* 07/12/2013 0941   RBC 2.97* 08/20/2012 0846   HGB 9.1* 07/12/2013 0941   HGB 10.1* 08/20/2012 0846   HCT 27.8* 07/12/2013 0941   HCT 30.0* 08/20/2012 0846   PLT 245 07/12/2013 0941   PLT 99* 08/20/2012 0846   MCV 94.8 07/12/2013 0941   MCV 101.0* 08/20/2012 0846   MCH 31.1 07/12/2013 0941   MCH 34.0 08/20/2012 0846   MCHC 32.9 07/12/2013 0941   MCHC 33.7 08/20/2012 0846   RDW 16.8* 07/12/2013 0941   RDW 15.9* 08/20/2012 0846   LYMPHSABS 0.8* 07/12/2013 0941   LYMPHSABS 0.4* 08/20/2012 0846   MONOABS 0.5 07/12/2013 0941   MONOABS 0.1 08/20/2012 0846   EOSABS 0.2 07/12/2013 0941   EOSABS 0.1 08/20/2012 0846   BASOSABS 0.0 07/12/2013 0941   BASOSABS 0.0 08/20/2012 0846    CMP     Component Value Date/Time   NA 141 07/12/2013 0941   NA 138 06/25/2012 0858   K 3.5 07/12/2013 0941   K 3.1* 06/25/2012 0858   CL 102 02/25/2013 0753   CL 99 06/25/2012 0858   CO2 29 07/12/2013 0941   CO2 30 06/25/2012 0858   GLUCOSE 102 07/12/2013 0941   GLUCOSE 86 02/25/2013 0753   GLUCOSE 88 06/25/2012 0858   BUN 6.7* 07/12/2013 0941   BUN 10 06/25/2012 0858   CREATININE 0.7 07/12/2013 0941   CREATININE 0.47* 06/25/2012 0858   CALCIUM 9.5 07/12/2013 0941   CALCIUM 9.6 06/25/2012 0858   PROT 7.4 07/12/2013 0941   PROT 6.8 05/03/2012 0905   ALBUMIN 3.2* 07/12/2013 0941   ALBUMIN 3.8 05/03/2012 0905   AST 21 07/12/2013 0941   AST 14 05/03/2012 0905   ALT 15 07/12/2013 0941   ALT 11 05/03/2012 0905   ALKPHOS 60 07/12/2013 0941   ALKPHOS 54 05/03/2012 0905   BILITOT 0.24 07/12/2013 0941   BILITOT 0.3 05/03/2012 0905   GFRNONAA >90 12/29/2011 0350   GFRAA >90 12/29/2011 0350    Radiological Studies: CT CHEST, ABDOMEN AND PELVIS WITH CONTRAST (05/16/2013) Technique: Multidetector CT imaging of the chest, abdomen and pelvis was performed following the standard protocol during bolus administration of intravenous  contrast.  Contrast: OMNIPAQUE IOHEXOL 300 MG/ML SOLN  Comparison: 03/01/2013  CT CHEST  Findings: 4.3 x 3.2 cm cavitary lesion in the superior segment left lower lobe (series 5/image 24), previously 3.5 x 2.6 cm.  Increasing fluid within the lesion.  Moderate paraseptal emphysematous changes in the upper lobes.  No new/suspicious pulmonary nodules. No pleural effusion or pneumothorax.  Postprocedural changes in the lower left neck.  The visualized thyroid is unremarkable.  The heart is top normal in size. No pericardial effusion.  Left chest port.  No  suspicious mediastinal, hilar, or axillary lymphadenopathy.  Surgical clips in the left axilla.  Mild degenerative changes of the thoracic spine.  IMPRESSION:  4.3 x 3.2 cm cavitary lesion/metastasis in the superior segment left lower lobe, mildly increased.  Moderate paraseptal emphysematous changes.  CT ABDOMEN AND PELVIS  Findings: Gastrostomy tube in satisfactory position.  Liver, pancreas, and adrenal glands are within normal limits.  5.9 x 4.1 cm rim calcified, multiloculated lesion in the medial spleen (series 2/image 63), unchanged.  Suspected cholelithiasis (series 2/image 75), without associated inflammatory changes.  1.5 x 1.2 cm medial right upper pole renal cyst (series 2/image  71). Left kidney is within normal limits. No hydronephrosis.  No evidence of bowel obstruction. Normal appendix.  Atherosclerotic calcifications of the abdominal aorta and branch  vessels.  No abdominopelvic ascites.  No suspicious abdominopelvic lymphadenopathy.  Prostate is at the upper limits of normal for size.  Bladder is within normal limits.  Degenerative changes of the lumbar spine. Known metastasis in the  left iliac bone is not evident on CT.  IMPRESSION:  Known metastasis in the left iliac bone is not evident on CT.  No evidence of new/progressive metastatic disease in the abdomen/pelvis.  Additional stable ancillary findings  as above.  MRI CHEST WITHOUT AND WITH CONTRAST  TECHNIQUE:  Multiplanar multisequence MR imaging of the chest was performed both  before and after administration of intravenous contrast.  CONTRAST: 15mL MULTIHANCE GADOBENATE DIMEGLUMINE 529 MG/ML IV SOLN  COMPARISON: 05/16/2013  FINDINGS:  5.5 x 5.6 cm pleural based superior segment left lower lobe mass invades the left chest wall, particularly the left 5th rib, the adjacent 4th and 5th intercostal spaces, and eroding into a portion of the adjacent left 4th rib. Tumor surrounds the left 5th rib, extending deep to the inferior portion of the subscapularis and latissimus Dorsey. Abnormal enhancing tissue and edema tracks longitudinally in the 5th intercostal space, between the 5th and 6th ribs.  The mass may be invading across the major fissure into the adjacent portion of the left upper lobe based on imaging characteristics. Cystic lesion in the spleen is again observed. This has known marginal calcifications on CT scan. Gallstones are observed in the gallbladder. No pleural effusion observed.  IMPRESSION:  1. Interval enlargement and chest wall invasion associated with the 5.6 cm superior segment left lower lobe mass, with tumor extending into the left 5th rib and the adjacent 4th and 5th intercostal spaces. Abnormal enhancement compatible with tumor extension tracking anteriorly in the 5th intercostal space between the 5th and 6th ribs. Abnormal soft tissue encases the left 5th rib, and there is early involvement of the left 4th rib as well.  2. Rim calcified multi-septated cystic mass in the spleen. Similar appearance to prior exams - no hypermetabolism on prior PET-CT.  3. Cholelithiasis.  Impression and Plan: ASSESSMENT AND PLAN: 1. Recurrent left lateral tongue squamous cell carcinoma: He now has metastatic disease to the lung and bone and left chest wall. --He has been seen by [facility] (Dr. Elaina Pattee) with continued plans for clinical trial  enrollment.  They are reviewing his MRI and his [id number] clinical sequencing should be done within next several of days.  He advised [place] to re-stage with PET following receipt of XRT.  If other disease sites remain well controlled, he would consider either maintaining the same regimen (carbo/cetuximab/docetaxel) versus observation.  Alternatively, if there was progression to the other sites, he would consider transitioning to a new therapy.  This would  include clinical trial (based on [id number] or other) versus methotrexate or other agent such as gemcitabine or perhaps pemetrexed.  He will follow up with Dr. Madilyn Fireman in 4 weeks.  Restage 2 weeks following XRT.   --He has also been undergone simulation for XRT to his left chest wall by Dr. Michell Heinrich.  We will hold his chemotherapy for today and continue his palliative XRT which will start 07/13/2013.  We will plan to continue or switch therapies based on the results of his PET as noted above.  2. Skin rash secondary to Erbitux, stable.  He is on Cleocin cream and Hydrocortisone cream prn.  3. 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  4. Bone met: limited. S/p palliative XRT. He has been on Zometa every 6 weeks. Zometa given on 05/14/2013 and will be given today (06/24/2013). 5. Anemia, gade 1 : This is from chemo. There is no active bleeding. No transfusion indicated.  6. Smoking: none at this time.  7. Follow up: in 3-4 weeks  With labs and PET CT.   All questions were answered. The patient knows to call the clinic with any problems, questions or concerns. We can certainly see the patient much sooner if necessary.  Patient provided prescriptions for fentanyl patch.   Patient also was provided an after visit clinical summary.    I spent 15 minutes or more than half the time coordinating care during 25 minute follow-up visit.    Adlee Paar, MD 07/12/2013  5:00 PM

## 2013-07-15 ENCOUNTER — Ambulatory Visit: Payer: Self-pay

## 2013-07-16 ENCOUNTER — Ambulatory Visit
Admission: RE | Admit: 2013-07-16 | Discharge: 2013-07-16 | Disposition: A | Discharge status: Home / Self Care | Payer: Medicaid Other | Source: Ambulatory Visit | Attending: Radiation Oncology | Admitting: Radiation Oncology

## 2013-07-16 DIAGNOSIS — R911 Solitary pulmonary nodule: Secondary | ICD-10-CM

## 2013-07-16 NOTE — Progress Notes (Signed)
  Radiation Oncology         (336) [phone] ________________________________  Name: Thomas Bean MRN: [id number]  Date: 07/16/2013  DOB: [birth year]  Simulation Verification Note  Status: outpatient  NARRATIVE: The patient was brought to the treatment unit and placed in the planned treatment position. The clinical setup was verified. Then port films were obtained and uploaded to the radiation oncology medical record software.  The treatment beams were carefully compared against the planned radiation fields. The position location and shape of the radiation fields was reviewed. The targeted volume of tissue appears appropriately covered by the radiation beams. Organs at risk appear to be excluded as planned.  Based on my personal review, I approved the simulation verification. The patient's treatment will proceed as planned.  ------------------------------------------------  Lurline Hare, MD '

## 2013-07-17 ENCOUNTER — Ambulatory Visit
Admission: RE | Admit: 2013-07-17 | Discharge: 2013-07-17 | Disposition: A | Discharge status: Home / Self Care | Payer: Medicaid Other | Source: Ambulatory Visit | Attending: Radiation Oncology | Admitting: Radiation Oncology

## 2013-07-18 ENCOUNTER — Encounter: Payer: Self-pay | Admitting: Internal Medicine

## 2013-07-18 ENCOUNTER — Ambulatory Visit
Admission: RE | Admit: 2013-07-18 | Discharge: 2013-07-18 | Disposition: A | Discharge status: Home / Self Care | Payer: Medicaid Other | Source: Ambulatory Visit | Attending: Radiation Oncology | Admitting: Radiation Oncology

## 2013-07-19 ENCOUNTER — Ambulatory Visit
Admission: RE | Admit: 2013-07-19 | Discharge: 2013-07-19 | Disposition: A | Discharge status: Home / Self Care | Payer: Medicaid Other | Source: Ambulatory Visit | Attending: Radiation Oncology | Admitting: Radiation Oncology

## 2013-07-19 ENCOUNTER — Other Ambulatory Visit: Payer: Self-pay | Admitting: Internal Medicine

## 2013-07-19 ENCOUNTER — Encounter: Payer: Self-pay | Admitting: Internal Medicine

## 2013-07-19 ENCOUNTER — Other Ambulatory Visit: Payer: Self-pay | Admitting: Radiation Oncology

## 2013-07-19 MED ORDER — OXYCODONE HCL 5 MG PO TABS: ORAL_TABLET | ORAL | Status: DC

## 2013-07-19 NOTE — Progress Notes (Signed)
Called to let the patient know the medicaid was reactivated. And I am checking into those gone to agency to make sure medicaid was filed properly.

## 2013-07-22 ENCOUNTER — Ambulatory Visit: Payer: Self-pay

## 2013-07-22 ENCOUNTER — Ambulatory Visit
Admission: RE | Admit: 2013-07-22 | Discharge: 2013-07-22 | Disposition: A | Discharge status: Home / Self Care | Payer: Medicaid Other | Source: Ambulatory Visit | Attending: Radiation Oncology | Admitting: Radiation Oncology

## 2013-07-23 ENCOUNTER — Ambulatory Visit
Admission: RE | Admit: 2013-07-23 | Discharge: 2013-07-23 | Disposition: A | Discharge status: Home / Self Care | Payer: Medicaid Other | Source: Ambulatory Visit | Attending: Radiation Oncology | Admitting: Radiation Oncology

## 2013-07-23 VITALS — BP 129/72 | HR 98 | Temp 99.0°F | Wt 166.9 lb

## 2013-07-23 DIAGNOSIS — C770 Secondary and unspecified malignant neoplasm of lymph nodes of head, face and neck: Secondary | ICD-10-CM

## 2013-07-23 DIAGNOSIS — C7951 Secondary malignant neoplasm of bone: Secondary | ICD-10-CM

## 2013-07-23 MED ORDER — BIAFINE EX EMUL
CUTANEOUS | Status: DC | PRN
  Administered 2013-07-23: 15:00:00 via TOPICAL

## 2013-07-23 MED ORDER — FENTANYL 25 MCG/HR TD PT72: 25.0000 ug | MEDICATED_PATCH | TRANSDERMAL | Status: DC

## 2013-07-23 NOTE — Progress Notes (Signed)
Patient for weekly assessment.of radiation to left chest wall.Completed 5 of 10 treatments.Reviewed clinic routine .Given biafine to apply twice daily.Main concern is pain controll.On duragesic 50 mcg and alternates hydrocodone and oxycodone.Pain level "6" on whole left side.

## 2013-07-23 NOTE — Progress Notes (Signed)
Weekly Management Note Current Dose:  15 Gy  Projected Dose: 30 Gy   Narrative:  The patient presents for routine under treatment assessment.  CBCT/MVCT images/Port film x-rays were reviewed.  The chart was checked. More pain. Not controlled with oxycodone 10 mg 4-6 tinmes per day and the 50 mcg patch. Some headaches and nausea from oxycodone. Taking miralax for constipation. Tender over right rib and shoulder. On neurontin.   Physical Findings: Weight: 166 lb 14.4 oz (75.705 kg). No skin changes.   Impression:  The patient is tolerating radiation.  Plan:  Continue treatment as planned. Increase Duragesic to 75 mcg. May increase to 100 mcg if needed. Wrote 25 mg script.

## 2013-07-24 ENCOUNTER — Ambulatory Visit
Admission: RE | Admit: 2013-07-24 | Discharge: 2013-07-24 | Disposition: A | Discharge status: Home / Self Care | Payer: Medicaid Other | Source: Ambulatory Visit | Attending: Radiation Oncology | Admitting: Radiation Oncology

## 2013-07-24 NOTE — Progress Notes (Signed)
Prior authorization on fentanyl patch 25 mcg completed and faxed to [place] [facility].Dosage has been increased from  50 mcg to 75 mcg.Patient informed this could take up to 24 hours for approval but I would call once approved.

## 2013-07-25 ENCOUNTER — Ambulatory Visit
Admission: RE | Admit: 2013-07-25 | Discharge: 2013-07-25 | Disposition: A | Discharge status: Home / Self Care | Payer: Medicaid Other | Source: Ambulatory Visit | Attending: Radiation Oncology | Admitting: Radiation Oncology

## 2013-07-26 ENCOUNTER — Ambulatory Visit
Admission: RE | Admit: 2013-07-26 | Discharge: 2013-07-26 | Disposition: A | Discharge status: Home / Self Care | Payer: Medicaid Other | Source: Ambulatory Visit | Attending: Radiation Oncology | Admitting: Radiation Oncology

## 2013-07-29 ENCOUNTER — Ambulatory Visit
Admission: RE | Admit: 2013-07-29 | Discharge: 2013-07-29 | Disposition: A | Discharge status: Home / Self Care | Payer: Medicaid Other | Source: Ambulatory Visit | Attending: Radiation Oncology | Admitting: Radiation Oncology

## 2013-07-30 ENCOUNTER — Encounter: Payer: Self-pay | Admitting: Radiation Oncology

## 2013-07-30 ENCOUNTER — Ambulatory Visit
Admission: RE | Admit: 2013-07-30 | Discharge: 2013-07-30 | Disposition: A | Discharge status: Home / Self Care | Payer: Medicaid Other | Source: Ambulatory Visit | Attending: Radiation Oncology | Admitting: Radiation Oncology

## 2013-07-30 VITALS — BP 119/66 | HR 89 | Temp 98.5°F | Resp 20 | Wt 163.9 lb

## 2013-07-30 DIAGNOSIS — C770 Secondary and unspecified malignant neoplasm of lymph nodes of head, face and neck: Secondary | ICD-10-CM

## 2013-07-30 DIAGNOSIS — C029 Malignant neoplasm of tongue, unspecified: Secondary | ICD-10-CM

## 2013-07-30 DIAGNOSIS — C7951 Secondary malignant neoplasm of bone: Secondary | ICD-10-CM

## 2013-07-30 MED ORDER — HYDROCODONE-ACETAMINOPHEN 5-325 MG PO TABS: ORAL_TABLET | ORAL | Status: DC

## 2013-07-30 NOTE — Progress Notes (Signed)
  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      Indication for treatment:  Palliative       Radiation treatment dates:   07/17/13-07/30/13  Site/dose:   Left chest / 30 gy at 3 Gy per fraction x 10 fractions  Beams/energy:   4 field with 10 and 6 MV photons.   Narrative: The patient tolerated radiation treatment relatively well.   He unfortunately continued to have pain and was on 75 mcg of duragesic with 10 mg of oxycodone 6 times a day and neurontin and hydrocodone bid for pain.  He has a follow up with medical oncology scheduled.   Plan: The patient has completed radiation treatment. The patient will return to radiation oncology clinic for routine followup in one month. I advised them to call or return sooner if they have any questions or concerns related to their recovery or treatment.  ------------------------------------------------  Lurline Hare, MD

## 2013-07-30 NOTE — Progress Notes (Signed)
Patient for weekly assessment.Completed 10 of 10 radiation treatments to left chest.Pain level about the same "6".Duragesic patch increased from 50 to 785 mcg.Slight decrease in appetite.To schedule one month follow up.

## 2013-07-31 ENCOUNTER — Other Ambulatory Visit (HOSPITAL_BASED_OUTPATIENT_CLINIC_OR_DEPARTMENT_OTHER): Payer: Medicaid Other | Admitting: Lab

## 2013-07-31 ENCOUNTER — Ambulatory Visit (HOSPITAL_BASED_OUTPATIENT_CLINIC_OR_DEPARTMENT_OTHER): Payer: Medicaid Other | Admitting: Internal Medicine

## 2013-07-31 ENCOUNTER — Telehealth: Payer: Self-pay | Admitting: Internal Medicine

## 2013-07-31 ENCOUNTER — Encounter: Payer: Self-pay | Admitting: Internal Medicine

## 2013-07-31 VITALS — BP 117/75 | HR 89 | Temp 98.0°F | Resp 20 | Ht 72.0 in | Wt 164.1 lb

## 2013-07-31 DIAGNOSIS — C7800 Secondary malignant neoplasm of unspecified lung: Secondary | ICD-10-CM

## 2013-07-31 DIAGNOSIS — C029 Malignant neoplasm of tongue, unspecified: Secondary | ICD-10-CM

## 2013-07-31 DIAGNOSIS — D649 Anemia, unspecified: Secondary | ICD-10-CM

## 2013-07-31 DIAGNOSIS — C7951 Secondary malignant neoplasm of bone: Secondary | ICD-10-CM

## 2013-07-31 DIAGNOSIS — C770 Secondary and unspecified malignant neoplasm of lymph nodes of head, face and neck: Secondary | ICD-10-CM

## 2013-07-31 LAB — COMPREHENSIVE METABOLIC PANEL (CC13)
Alkaline Phosphatase: 66 U/L (ref 40–150)
Anion Gap: 10 mEq/L (ref 3–11)
CO2: 26 mEq/L (ref 22–29)
Calcium: 9.6 mg/dL (ref 8.4–10.4)
Chloride: 101 mEq/L (ref 98–109)
Glucose: 124 mg/dl (ref 70–140)
Sodium: 137 mEq/L (ref 136–145)
Total Bilirubin: 0.33 mg/dL (ref 0.20–1.20)
Total Protein: 7.9 g/dL (ref 6.4–8.3)

## 2013-07-31 LAB — CBC WITH DIFFERENTIAL/PLATELET
Basophils Absolute: 0 10*3/uL (ref 0.0–0.1)
Eosinophils Absolute: 0.2 10*3/uL (ref 0.0–0.5)
HCT: 25.5 % — ABNORMAL LOW (ref 38.4–49.9)
LYMPH%: 6.8 % — ABNORMAL LOW (ref 14.0–49.0)
MONO#: 0.5 10*3/uL (ref 0.1–0.9)
NEUT#: 4.7 10*3/uL (ref 1.5–6.5)
NEUT%: 80.8 % — ABNORMAL HIGH (ref 39.0–75.0)
Platelets: 330 10*3/uL (ref 140–400)
RBC: 2.74 10*6/uL — ABNORMAL LOW (ref 4.20–5.82)
WBC: 5.9 10*3/uL (ref 4.0–10.3)

## 2013-07-31 MED ORDER — HEPARIN SOD (PORK) LOCK FLUSH 100 UNIT/ML IV SOLN
500.0000 [IU] | Freq: Once | INTRAVENOUS | Status: AC
  Administered 2013-07-31: 500 [IU] via INTRAVENOUS
  Filled 2013-07-31: qty 5

## 2013-07-31 MED ORDER — SODIUM CHLORIDE 0.9 % IJ SOLN
10.0000 mL | INTRAMUSCULAR | Status: DC | PRN
  Administered 2013-07-31: 10 mL via INTRAVENOUS
  Filled 2013-07-31: qty 10

## 2013-07-31 NOTE — Telephone Encounter (Signed)
appts made per 11/19 POF SW pt He uses my chart shh

## 2013-08-01 NOTE — Progress Notes (Signed)
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 He developed biopsy proven metastatic disease in 03/2012 with met in right neck, left lung mass and left illac.   Prior Therapy:  1. 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 He developed recurrence in the left submandibular gland in August 2012. He underwent on 07/27/2011 radical left neck dissection by Dr. Ezzard Standing and Dr. Geryl Rankins. Pathology case number [id number] showed total 15 lymph nodes from left radical neck dissection that was all negative. However the left submandibular gland mass showed a 3.5 cm invasive SCC, moderately differentiates involving the adjacent skeletal muscle tissue and bone a tissue and extending into the inked margin. There was no evidence of any lymphatic invasion or perineural invasion. He underwent further resection by Dr. Mary Sella at [facility] on 09/29/2011.  2. He received adjuvant chemoradiation therapy with q 3 week Cisplatin given 11/15/11-12/05/11 (received 2 of 3 doses; 3rd dose held due to febrile neutropenia and development of flap abscess). He received concurrent XRT 11/15/11 through 01/05/12.  3. For right neck recurrence in 03/2012; he underwent palliative right neck dissection by Dr. Manson Passey at [facility].   Current therapy:  Started on 05/15/2012 palliative chemo weekly Carboplatin/Taxol/Erbitux; 3 weeks on; 1 week off. Due to prolonged cytopenia, his chemo has been changed to weekly; 2 weeks on, 1 week off.   He is on zometa q 6 weeks (last dose 06/24/2013). Palliative XRT to the left chest / 30 gy at 3 Gy per fraction x 10 fractions started on 07/17/2013 and  completed on 07/30/2013.   Interim History:  Thomas Bean 52 y.o. male returns for regular follow up visit. He was seen by me on 07/12/2013.  Patient has been followed Dr. Michell Heinrich palliative XRT to his left chest wall based on his recent MRI of chest findings consistent with progressive disease with 5.5 x 5.6 left lower lobe mass invading the left chest wall into the fourth and fifth rib.   He underwent simulation of 10/28. He completed XRT on 07/30/13.  His pain medications have been increased.  His fentanyl 50 mcg/hr to 75 mcg/hr.   In addition, he was seen by Dr. Elaina Pattee of [facility] 10/27 regarding review of his case for consideration of clinical trial enrollment.  He is awaiting final sequencing results to determine if he has an actionable mutation for which he might be a candidate.  Other pain medications include his gabapentin and hydrocodone 5/325 prn.  Today, he is accompanied by his father.  He tolerated his XRT with moderate fatigue. He tolerates the osmolite 1.5 at 60 ml/hr for 12 hours nocturnal continuous feeding daily.   The patient denied fever, chills, night sweats, change in appetite or weight. He denied headaches, double vision, blurry vision, nasal congestion, nasal discharge, odynophagia or dysphagia. No palpitations, dyspnea, cough, abdominal pain,  vomiting, diarrhea, hematochezia. The patient denied dysuria, nocturia, polyuria, hematuria, myalgia, tingling.   Medications: I have reviewed the patient's current medications.  Current Outpatient Prescriptions  Medication Sig Dispense Refill  . clindamycin (CLINDAGEL) 1 % gel Apply topically as needed. Apply to facial and chest skin rash.  30 g  3  . clobetasol cream (TEMOVATE) 0.05 % Apply  topically as needed. Apply to rash BID      . fentaNYL (DURAGESIC - DOSED MCG/HR) 25 MCG/HR patch Place 1 patch (25 mcg total) onto the skin every 3 (three) days.  10 patch  0  . gabapentin (NEURONTIN) 300 MG capsule Take 1 capsule (300 mg  total) by mouth 3 (three) times daily.  90 capsule  3  . HYDROcodone-acetaminophen (NORCO/VICODIN) 5-325 MG per tablet Take 1 tablet by mouth every 6 (six) hours as needed for pain.  90 tablet  0  . hydrocortisone cream 0.5 % Apply topically 2 (two) times daily as needed. Apply to axilla rash twice daily as needed.      . lidocaine-prilocaine (EMLA) cream Apply topically as needed. Apply to [facility] site one hour prior to needle stick to numb skin.  30 g  3  . LORazepam (ATIVAN) 0.5 MG tablet DISSOLVE 1 TABLET UNDER THE TONGUE EVERY 6 HOURS AS NEEDED FOR ANXIETY OR FOR NAUSEA AND VOMITING  60 tablet  0  . Nutritional Supplements (FEEDING SUPPLEMENT, OSMOLITE 1.5 CAL,) LIQD Begin Osmolite 1.5 at 60 ml/hr for 12 hours nocturnal continuous feeding daily. Flush with 60 ml free water before and after continuous feedings. Continue 1 can of Osmolite 1.5 via gravity feeding TID with 60 ml free water before and after each bolus feeding. Please send pump to patient.  1422 mL  0  . omeprazole (PRILOSEC) 10 MG capsule Take 10 mg by mouth daily.      . ondansetron (ZOFRAN) 8 MG tablet Take 8 mg by mouth every 12 (twelve) hours as needed.      . prochlorperazine (COMPAZINE) 10 MG tablet Take 1 tablet (10 mg total) by mouth every 6 (six) hours as needed.  60 tablet  1  . sertraline (ZOLOFT) 50 MG tablet Take 1.5 tablets (75 mg total) by mouth daily.  60 tablet  0  . sodium fluoride (PREVIDENT 5000 PLUS) 1.1 % CREA dental cream Apply thin ribbon of cream to tooth brush. Brush teeth for 2 minutes. Spit out excess-DO NOT swallow. DO NOT rinse afterwards. Repeat nightly.  1 Tube  prn   No current facility-administered medications for this visit.   Allergies:  Allergies  Allergen Reactions  . Codeine Phosphate     REACTION: rash, swelling  . Morphine And Related   . Tape     Blisters; please use paper tape    Past Medical History, Surgical history, Social history, and Family History were reviewed and  updated.  Review of Systems: Constitutional:  Negative for fever, chills, night sweats, anorexia, weight loss,2 out of 10 pain to left rib cage, neck  Cardiovascular: no chest pain or dyspnea on exertion Respiratory: no cough, shortness of breath, or wheezing Neurological: no TIA or stroke symptoms Dermatological: positive for rash negative for lumps ENT: negative for - oral lesions Skin: Negative. Gastrointestinal: no abdominal pain, change in bowel habits, or black or bloody stools positive for - G tube Genito-Urinary: no dysuria, trouble voiding, or hematuria Hematological and Lymphatic: negative for - bleeding problems Musculoskeletal: negative for - gait disturbance Remaining ROS negative.  Physical Exam: BP 117/75  Pulse 89  Temp(Src) 98 F (36.7 C) (Oral)  Resp 20  Ht 6' (1.829 m)  Wt 164 lb 1.6 oz (74.435 kg)  BMI 22.25 kg/m2 ECOG: 0 General appearance: alert, cooperative, appears stated age and no distress Head: Normocephalic, without obvious abnormality, atraumatic HEENT: Sclerae anicteric. Conjuncitvae pink. PERRLA; Oral mucosa moist without ulceration or thrush.  Neck: + cervical adenopathy about 1 cm rubbery and non-tender, supple, symmetrical, trachea midline and thyroid not enlarged, symmetric, no tenderness/mass/nodules. No subandibular adenopathy. S/p major neck surgery with neck scars well healed.  Lymph nodes: Cervical adenopathy: as noted above.  Supraclavicular adenopathy: None appreciated; No  Heart:regular rate and rhythm, S1, S2 normal, no murmur, click, rub or gallop Lung:chest clear, no wheezing, rales, normal symmetric air entry, Heart exam - S1, S2 normal, no murmur, no gallop, rate regular Abdomin: soft, non-tender, without masses or organomegaly + G tube without signs of infections.  EXT: No peripheral edema; positive tenderness over left scapular, left chest wall.  Skin: Rash on face, back, chest is resolved. + Left Chest port-a-cath without  tenderness or fluctuance   Lab Results: CBC    Component Value Date/Time   WBC 5.9 07/31/2013 0847   WBC 1.8* 08/20/2012 0846   RBC 2.74* 07/31/2013 0847   RBC 2.97* 08/20/2012 0846   HGB 8.3* 07/31/2013 0847   HGB 10.1* 08/20/2012 0846   HCT 25.5* 07/31/2013 0847   HCT 30.0* 08/20/2012 0846   PLT 330 07/31/2013 0847   PLT 99* 08/20/2012 0846   MCV 92.9 07/31/2013 0847   MCV 101.0* 08/20/2012 0846   MCH 30.4 07/31/2013 0847   MCH 34.0 08/20/2012 0846   MCHC 32.7 07/31/2013 0847   MCHC 33.7 08/20/2012 0846   RDW 17.2* 07/31/2013 0847   RDW 15.9* 08/20/2012 0846   LYMPHSABS 0.4* 07/31/2013 0847   LYMPHSABS 0.4* 08/20/2012 0846   MONOABS 0.5 07/31/2013 0847   MONOABS 0.1 08/20/2012 0846   EOSABS 0.2 07/31/2013 0847   EOSABS 0.1 08/20/2012 0846   BASOSABS 0.0 07/31/2013 0847   BASOSABS 0.0 08/20/2012 0846    CMP     Component Value Date/Time   NA 137 07/31/2013 0848   NA 138 06/25/2012 0858   K 4.0 07/31/2013 0848   K 3.1* 06/25/2012 0858   CL 102 02/25/2013 0753   CL 99 06/25/2012 0858   CO2 26 07/31/2013 0848   CO2 30 06/25/2012 0858   GLUCOSE 124 07/31/2013 0848   GLUCOSE 86 02/25/2013 0753   GLUCOSE 88 06/25/2012 0858   BUN 8.4 07/31/2013 0848   BUN 10 06/25/2012 0858   CREATININE 0.6* 07/31/2013 0848   CREATININE 0.47* 06/25/2012 0858   CALCIUM 9.6 07/31/2013 0848   CALCIUM 9.6 06/25/2012 0858   PROT 7.9 07/31/2013 0848   PROT 6.8 05/03/2012 0905   ALBUMIN 2.9* 07/31/2013 0848   ALBUMIN 3.8 05/03/2012 0905   AST 26 07/31/2013 0848   AST 14 05/03/2012 0905   ALT 31 07/31/2013 0848   ALT 11 05/03/2012 0905   ALKPHOS 66 07/31/2013 0848   ALKPHOS 54 05/03/2012 0905   BILITOT 0.33 07/31/2013 0848   BILITOT 0.3 05/03/2012 0905   GFRNONAA >90 12/29/2011 0350   GFRAA >90 12/29/2011 0350    Radiological Studies: CT CHEST, ABDOMEN AND PELVIS WITH CONTRAST (05/16/2013) Technique: Multidetector CT imaging of the chest, abdomen and pelvis was performed following the standard  protocol during bolus administration of intravenous contrast.  Contrast: OMNIPAQUE IOHEXOL 300 MG/ML SOLN  Comparison: 03/01/2013  CT CHEST  Findings: 4.3 x 3.2 cm cavitary lesion in the superior segment left lower lobe (series 5/image 24), previously 3.5 x 2.6 cm.  Increasing fluid within the lesion.  Moderate paraseptal emphysematous changes in the upper lobes.  No new/suspicious pulmonary nodules. No pleural effusion or pneumothorax.  Postprocedural changes in the lower left neck.  The visualized  thyroid is unremarkable.  The heart is top normal in size. No pericardial effusion.  Left chest port.  No suspicious mediastinal, hilar, or axillary lymphadenopathy.  Surgical clips in the left axilla.  Mild degenerative changes of the thoracic spine.  IMPRESSION:  4.3 x 3.2 cm cavitary lesion/metastasis in the superior segment left lower lobe, mildly increased.  Moderate paraseptal emphysematous changes.  CT ABDOMEN AND PELVIS  Findings: Gastrostomy tube in satisfactory position.  Liver, pancreas, and adrenal glands are within normal limits.  5.9 x 4.1 cm rim calcified, multiloculated lesion in the medial spleen (series 2/image 63), unchanged.  Suspected cholelithiasis (series 2/image 75), without associated inflammatory changes.  1.5 x 1.2 cm medial right upper pole renal cyst (series 2/image  71). Left kidney is within normal limits. No hydronephrosis.  No evidence of bowel obstruction. Normal appendix.  Atherosclerotic calcifications of the abdominal aorta and branch  vessels.  No abdominopelvic ascites.  No suspicious abdominopelvic lymphadenopathy.  Prostate is at the upper limits of normal for size.  Bladder is within normal limits.  Degenerative changes of the lumbar spine. Known metastasis in the  left iliac bone is not evident on CT.  IMPRESSION:  Known metastasis in the left iliac bone is not evident on CT.  No evidence of new/progressive metastatic disease in the  abdomen/pelvis.  Additional stable ancillary findings as above.  MRI CHEST WITHOUT AND WITH CONTRAST  TECHNIQUE:  Multiplanar multisequence MR imaging of the chest was performed both  before and after administration of intravenous contrast.  CONTRAST: 15mL MULTIHANCE GADOBENATE DIMEGLUMINE 529 MG/ML IV SOLN  COMPARISON: 05/16/2013  FINDINGS:  5.5 x 5.6 cm pleural based superior segment left lower lobe mass invades the left chest wall, particularly the left 5th rib, the adjacent 4th and 5th intercostal spaces, and eroding into a portion of the adjacent left 4th rib. Tumor surrounds the left 5th rib, extending deep to the inferior portion of the subscapularis and latissimus Dorsey. Abnormal enhancing tissue and edema tracks longitudinally in the 5th intercostal space, between the 5th and 6th ribs.  The mass may be invading across the major fissure into the adjacent portion of the left upper lobe based on imaging characteristics. Cystic lesion in the spleen is again observed. This has known marginal calcifications on CT scan. Gallstones are observed in the gallbladder. No pleural effusion observed.  IMPRESSION:  1. Interval enlargement and chest wall invasion associated with the 5.6 cm superior segment left lower lobe mass, with tumor extending into the left 5th rib and the adjacent 4th and 5th intercostal spaces. Abnormal enhancement compatible with tumor extension tracking anteriorly in the 5th intercostal space between the 5th and 6th ribs. Abnormal soft tissue encases the left 5th rib, and there is early involvement of the left 4th rib as well.  2. Rim calcified multi-septated cystic mass in the spleen. Similar appearance to prior exams - no hypermetabolism on prior PET-CT.  3. Cholelithiasis.  Impression and Plan: ASSESSMENT AND PLAN: 1. Recurrent left lateral tongue squamous cell carcinoma: He now has metastatic disease to the lung and bone and left chest wall. --He has been seen by [facility] (Dr.  Elaina Pattee) with continued plans for clinical trial enrollment.  They are reviewing his  [id number] clinical sequencing which should be done within new few weeks.  I spoke with Dr. Elaina Pattee who will forward [place] information as soon as it is obtained. This will help determine if he has an actionable mutation for targeted drug therapy.  He advised [place] to re-stage with PET following receipt of XRT.  If other disease sites remained well controlled, he would recommend either maintaining the same regimen (carbo/cetuximab/docetaxel) versus observation.  Alternatively, if there was progression to the other sites, he would consider transitioning to a new therapy.  This would include clinical trial (based on [id number] or other) versus methotrexate or other agent such as gemcitabine or perhaps pemetrexed.  He will follow up with Dr. Madilyn Fireman during the second week of December.   --Restage 2 weeks following XRT is planned for December 2nd 2014.      --We will allow him continued recovery s/p receipt of XRT and he may potentially receive additional benefit in the new several days.   2. Skin rash secondary to Erbitux, resolved.  He is off erbitux  3. 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  4. Bone met: limited. S/p palliative XRT. He has been on Zometa every 6 weeks. Zometa given on 06/24/2013 and will be given with next chemotherapy.  5. Anemia: We will follow him symptomatically for  Transfusion.   6. Smoking: none at this time.  7. Follow up: in 3 weeks on 08/15/13 with labs and review of PET CT obtained on 12/02. This will determine next treatment as outlined above.    All questions were answered. The patient knows to call the clinic with any problems, questions or concerns. We can certainly see the patient much sooner if necessary.  Patient provided prescriptions for fentanyl patch.   Patient also was provided an after visit clinical summary.    I spent 15 minutes or more  than half the time coordinating care during 25 minute follow-up visit.    Braysen Cloward, MD 08/01/2013 9:00 am

## 2013-08-04 NOTE — Progress Notes (Signed)
  Radiation Oncology         [phone] ________________________________  Name: Thomas Bean MRN: [id number]  Date: 07/30/2013  DOB: [birth year]  End of Treatment Note  Diagnosis:   Metastatic squamous cell carcinoma     Indication for treatment:  Palliative       Radiation treatment dates:  07/17/2013-07/30/2013  Site/dose:   Left chest was treated to a total dose of 30 gray in 10 fractions at 3 gray per fraction  Beams/energy:   A 4 field technique was used with 6 and 10 MV photons  Narrative: The patient tolerated radiation treatment relatively well.   He unfortunately continued to experience pain towards the end of treatment and we increased his Duragesic patch.  Plan: The patient has completed radiation treatment. The patient will return to radiation oncology clinic for routine followup in one month. I advised them to call or return sooner if they have any questions or concerns related to their recovery or treatment.  ------------------------------------------------  Lurline Hare, MD

## 2013-08-06 ENCOUNTER — Other Ambulatory Visit: Payer: Self-pay | Admitting: Radiation Oncology

## 2013-08-06 MED ORDER — FENTANYL 75 MCG/HR TD PT72: 75.0000 ug | MEDICATED_PATCH | TRANSDERMAL | Status: DC

## 2013-08-06 MED ORDER — FENTANYL 25 MCG/HR TD PT72: 25.0000 ug | MEDICATED_PATCH | TRANSDERMAL | Status: DC

## 2013-08-06 MED ORDER — FENTANYL 50 MCG/HR TD PT72: 50.0000 ug | MEDICATED_PATCH | TRANSDERMAL | Status: DC

## 2013-08-07 ENCOUNTER — Encounter: Payer: Self-pay | Admitting: *Deleted

## 2013-08-07 NOTE — Progress Notes (Signed)
I witnessed that the rsxduragesic brought in by his Dad,Vernon Unrein, when he picked up the new rx's, witnessed Val Malloy,RN cutting up the duragesic rx 10:50 AM

## 2013-08-07 NOTE — Progress Notes (Signed)
Prescription for fentanyl 75 mcg was returned as pharmacies were out so script for 50 mcg and 25 were prescribed .prescriptions picked up this morning by patient's father.Prior authorization form completed today on 50 mcg and faxed awaiting approval.approval for 25 mcg completed #[id number].

## 2013-08-09 ENCOUNTER — Encounter: Payer: Self-pay | Admitting: Medical Oncology

## 2013-08-12 ENCOUNTER — Telehealth: Payer: Self-pay

## 2013-08-12 ENCOUNTER — Other Ambulatory Visit: Payer: Self-pay

## 2013-08-12 NOTE — Telephone Encounter (Signed)
S/w pt that we will be changing his OV appt to after his PET scan. PET is scheduled for 12/12.

## 2013-08-13 ENCOUNTER — Encounter (HOSPITAL_COMMUNITY): Payer: Medicaid Other

## 2013-08-13 ENCOUNTER — Telehealth: Payer: Self-pay | Admitting: Internal Medicine

## 2013-08-13 NOTE — Telephone Encounter (Signed)
sw pt adv of appts made for 12/16 per 12/1 POF shh

## 2013-08-14 ENCOUNTER — Other Ambulatory Visit: Payer: Self-pay | Admitting: Internal Medicine

## 2013-08-14 ENCOUNTER — Encounter: Payer: Self-pay | Admitting: Internal Medicine

## 2013-08-14 DIAGNOSIS — C7951 Secondary malignant neoplasm of bone: Secondary | ICD-10-CM

## 2013-08-14 DIAGNOSIS — C029 Malignant neoplasm of tongue, unspecified: Secondary | ICD-10-CM

## 2013-08-14 NOTE — Progress Notes (Unsigned)
08/14/2013 Thomas Bean had been scheduled for a PET SCAN per request from [facility] for participation in a clinical trial.  This request has been denied at the nurse review level and again at the medical director level.  Authorization has been obtained for C/A/P Ct Scans.  Central Scheduling is contacting the patient to offer him two dates for them before he is to return to see Dr. Rosie Fate.  The DENIAL was based upon [employer] Protocol for his diagnosis because the previous CT SCANS were not inconclusive and did offer a diagnosis.  Renee Ramus Pre-Cert Coordinator [id number]

## 2013-08-15 ENCOUNTER — Ambulatory Visit: Payer: Self-pay

## 2013-08-15 ENCOUNTER — Other Ambulatory Visit: Payer: Self-pay

## 2013-08-22 ENCOUNTER — Ambulatory Visit (HOSPITAL_COMMUNITY)
Admission: RE | Admit: 2013-08-22 | Discharge: 2013-08-22 | Disposition: A | Discharge status: Home / Self Care | Payer: Medicaid Other | Source: Ambulatory Visit | Attending: Internal Medicine | Admitting: Internal Medicine

## 2013-08-22 ENCOUNTER — Telehealth: Payer: Self-pay | Admitting: Internal Medicine

## 2013-08-22 ENCOUNTER — Encounter (HOSPITAL_COMMUNITY): Payer: Self-pay

## 2013-08-22 DIAGNOSIS — Z931 Gastrostomy status: Secondary | ICD-10-CM | POA: Insufficient documentation

## 2013-08-22 DIAGNOSIS — C029 Malignant neoplasm of tongue, unspecified: Secondary | ICD-10-CM | POA: Insufficient documentation

## 2013-08-22 DIAGNOSIS — K8020 Calculus of gallbladder without cholecystitis without obstruction: Secondary | ICD-10-CM | POA: Insufficient documentation

## 2013-08-22 DIAGNOSIS — N281 Cyst of kidney, acquired: Secondary | ICD-10-CM | POA: Insufficient documentation

## 2013-08-22 DIAGNOSIS — C7800 Secondary malignant neoplasm of unspecified lung: Secondary | ICD-10-CM | POA: Insufficient documentation

## 2013-08-22 DIAGNOSIS — R918 Other nonspecific abnormal finding of lung field: Secondary | ICD-10-CM | POA: Insufficient documentation

## 2013-08-22 DIAGNOSIS — K8689 Other specified diseases of pancreas: Secondary | ICD-10-CM | POA: Insufficient documentation

## 2013-08-22 DIAGNOSIS — C7951 Secondary malignant neoplasm of bone: Secondary | ICD-10-CM | POA: Insufficient documentation

## 2013-08-22 MED ORDER — IOHEXOL 300 MG/ML  SOLN
100.0000 mL | Freq: Once | INTRAMUSCULAR | Status: AC | PRN
  Administered 2013-08-22: 100 mL via INTRAVENOUS

## 2013-08-22 NOTE — Telephone Encounter (Signed)
I let the patient know that his scans was consistent with local progression.  His pain is well controlled.  Awaiting [facility] clinical trial discussion tomorrow.  Patient will follow up with [place] on 12/17.

## 2013-08-23 ENCOUNTER — Other Ambulatory Visit (HOSPITAL_COMMUNITY): Payer: Medicaid Other

## 2013-08-27 ENCOUNTER — Other Ambulatory Visit (HOSPITAL_BASED_OUTPATIENT_CLINIC_OR_DEPARTMENT_OTHER): Payer: Medicaid Other

## 2013-08-27 ENCOUNTER — Ambulatory Visit (HOSPITAL_BASED_OUTPATIENT_CLINIC_OR_DEPARTMENT_OTHER): Payer: Medicaid Other | Admitting: Internal Medicine

## 2013-08-27 ENCOUNTER — Telehealth: Payer: Self-pay | Admitting: Internal Medicine

## 2013-08-27 VITALS — BP 124/77 | HR 106 | Temp 98.6°F | Resp 18 | Ht 72.0 in | Wt 169.4 lb

## 2013-08-27 DIAGNOSIS — B977 Papillomavirus as the cause of diseases classified elsewhere: Secondary | ICD-10-CM

## 2013-08-27 DIAGNOSIS — C7951 Secondary malignant neoplasm of bone: Secondary | ICD-10-CM

## 2013-08-27 DIAGNOSIS — C7800 Secondary malignant neoplasm of unspecified lung: Secondary | ICD-10-CM

## 2013-08-27 DIAGNOSIS — C770 Secondary and unspecified malignant neoplasm of lymph nodes of head, face and neck: Secondary | ICD-10-CM

## 2013-08-27 DIAGNOSIS — C029 Malignant neoplasm of tongue, unspecified: Secondary | ICD-10-CM

## 2013-08-27 DIAGNOSIS — R21 Rash and other nonspecific skin eruption: Secondary | ICD-10-CM

## 2013-08-27 LAB — CBC WITH DIFFERENTIAL/PLATELET
BASO%: 0.7 % (ref 0.0–2.0)
EOS%: 5 % (ref 0.0–7.0)
HCT: 27.8 % — ABNORMAL LOW (ref 38.4–49.9)
HGB: 9 g/dL — ABNORMAL LOW (ref 13.0–17.1)
LYMPH%: 12.1 % — ABNORMAL LOW (ref 14.0–49.0)
MCH: 30.1 pg (ref 27.2–33.4)
MCHC: 32.4 g/dL (ref 32.0–36.0)
MCV: 92.8 fL (ref 79.3–98.0)
MONO#: 0.6 10*3/uL (ref 0.1–0.9)
NEUT%: 72 % (ref 39.0–75.0)
RBC: 3 10*6/uL — ABNORMAL LOW (ref 4.20–5.82)
RDW: 18.1 % — ABNORMAL HIGH (ref 11.0–14.6)
WBC: 5.5 10*3/uL (ref 4.0–10.3)
lymph#: 0.7 10*3/uL — ABNORMAL LOW (ref 0.9–3.3)

## 2013-08-27 LAB — COMPREHENSIVE METABOLIC PANEL (CC13)
ALT: 12 U/L (ref 0–55)
AST: 17 U/L (ref 5–34)
Albumin: 3.4 g/dL — ABNORMAL LOW (ref 3.5–5.0)
Anion Gap: 11 mEq/L (ref 3–11)
CO2: 28 mEq/L (ref 22–29)
Calcium: 9.9 mg/dL (ref 8.4–10.4)
Chloride: 101 mEq/L (ref 98–109)
Potassium: 4 mEq/L (ref 3.5–5.1)
Sodium: 140 mEq/L (ref 136–145)
Total Protein: 7.8 g/dL (ref 6.4–8.3)

## 2013-08-27 MED ORDER — FENTANYL 50 MCG/HR TD PT72: 50.0000 ug | MEDICATED_PATCH | TRANSDERMAL | Status: DC

## 2013-08-27 MED ORDER — HYDROCODONE-ACETAMINOPHEN 5-325 MG PO TABS: ORAL_TABLET | ORAL | Status: DC

## 2013-08-27 MED ORDER — LORAZEPAM 0.5 MG PO TABS: 0.5000 mg | ORAL_TABLET | Freq: Four times a day (QID) | ORAL | Status: DC | PRN

## 2013-08-27 MED ORDER — LIDOCAINE 5 % EX PTCH: 1.0000 | MEDICATED_PATCH | CUTANEOUS | Status: DC

## 2013-08-27 MED ORDER — LIDOCAINE-PRILOCAINE 2.5-2.5 % EX CREA: TOPICAL_CREAM | CUTANEOUS | Status: AC | PRN

## 2013-08-27 MED ORDER — FENTANYL 25 MCG/HR TD PT72: 25.0000 ug | MEDICATED_PATCH | TRANSDERMAL | Status: DC

## 2013-08-27 NOTE — Telephone Encounter (Signed)
gave pt appt for lab and MD for december and Janaury , emailed michelle regarding chemo

## 2013-08-28 ENCOUNTER — Telehealth: Payer: Self-pay | Admitting: *Deleted

## 2013-08-28 ENCOUNTER — Encounter: Payer: Self-pay | Admitting: *Deleted

## 2013-08-28 ENCOUNTER — Encounter: Payer: Self-pay | Admitting: Internal Medicine

## 2013-08-28 NOTE — Progress Notes (Signed)
Hematology and Oncology Follow Up Visit  Thomas Bean [id number] February 17, 1961 52 y.o. 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 He developed biopsy proven metastatic disease in 03/2012 with met in right neck, left lung mass and left illac.   Prior Therapy:  1. 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 He developed recurrence in the left submandibular gland in August 2012. He underwent on 07/27/2011 radical left neck dissection by Dr. Ezzard Standing and Dr. Geryl Rankins. Pathology case number [id number] showed total 15 lymph nodes from left radical neck dissection that was all negative. However the left submandibular gland mass showed a 3.5 cm invasive SCC, moderately differentiated involving the adjacent skeletal muscle tissue and bone a tissue and extending into the inked margin. There was no evidence of any lymphatic invasion or perineural invasion. He underwent further resection by Dr. Mary Sella at [facility] on 09/29/2011.  2. He received adjuvant chemoradiation therapy with q 3 week Cisplatin given 11/15/11-12/05/11 (received 2 of 3 doses; 3rd dose held due to febrile neutropenia and development of flap abscess). He received concurrent XRT 11/15/11 through 01/05/12.  3. For right neck recurrence in 03/2012; he underwent palliative right neck dissection by Dr. Manson Passey at [facility].   Current therapy:  Started on 05/15/2012 palliative chemo weekly Carboplatin/Taxol/Erbitux; 3 weeks on; 1 week off. Due to prolonged cytopenia, his chemo has been changed to weekly; 2 weeks on, 1 week off.   He is on zometa q 4 weeks (last dose 06/24/2013). Palliative XRT to the left chest wall 30 gy at 3 Gy per fraction x 10 fractions started on 07/17/2013 and  completed on 07/30/2013.   Interim History:  Thomas Bean 52 y.o. male returns for regular follow up visit. He was seen by me on 07/31/2013.  Patient has been followed Dr. Michell Heinrich palliative XRT to his left chest wall based on his recent MRI of chest findings consistent with progressive disease with 5.5 x 5.6 left lower lobe mass invading the left chest wall into the fourth and fifth rib.   He underwent simulation of 10/28. He completed XRT on 07/30/13.  His pain medications have been increased.  His fentanyl 50 mcg/hr to 75 mcg/hr.   In addition, he was seen by Dr. Elaina Pattee of [facility] 10/27 regarding review of his case for consideration of clinical trial enrollment.  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  With the exception of EGFR, no other actionable mutations were identified.   Other pain medications include his gabapentin and hydrocodone 5/325 prn.    Today, he is accompanied by his father and his mother.  He had a restaging CT scan of chest and abdomen and pelvis that showed local progression. He complains of trouble sleeping due to positional pain related to his left chest wall; trouble swallowing for the past few weeks.  He also endorses occasional hoarseness. He tolerated his XRT with moderate fatigue. He tolerates the osmolite 1.5 at 60 ml/hr for 12 hours nocturnal continuous feeding daily.   The patient denied fever, chills, night sweats, change in appetite or weight. He denied headaches, double vision, blurry vision, nasal congestion, nasal discharge, odynophagia or dysphagia. No palpitations, dyspnea, cough, abdominal pain,  vomiting, diarrhea, hematochezia. The patient denied dysuria, nocturia,  polyuria, hematuria, myalgia, tingling.   Medications: I have reviewed the patient's current medications.  Current Outpatient Prescriptions  Medication Sig Dispense Refill  . clindamycin (CLINDAGEL) 1 % gel Apply topically as needed. Apply to facial and chest skin  rash.  30 g  3  . clobetasol cream (TEMOVATE) 0.05 % Apply topically as needed. Apply to rash BID      . fentaNYL (DURAGESIC - DOSED MCG/HR) 25 MCG/HR patch Place 1 patch (25 mcg total) onto the skin every 3 (three) days.  10 patch  0  . gabapentin (NEURONTIN) 300 MG capsule Take 1 capsule (300 mg total) by mouth 3 (three) times daily.  90 capsule  3  . HYDROcodone-acetaminophen (NORCO/VICODIN) 5-325 MG per tablet Take 1 tablet by mouth every 6 (six) hours as needed for pain.  90 tablet  0  . hydrocortisone cream 0.5 % Apply topically 2 (two) times daily as needed. Apply to axilla rash twice daily as needed.      . lidocaine-prilocaine (EMLA) cream Apply topically as needed. Apply to [facility] site one hour prior to needle stick to numb skin.  30 g  3  . LORazepam (ATIVAN) 0.5 MG tablet DISSOLVE 1 TABLET UNDER THE TONGUE EVERY 6 HOURS AS NEEDED FOR ANXIETY OR FOR NAUSEA AND VOMITING  60 tablet  0  . Nutritional Supplements (FEEDING SUPPLEMENT, OSMOLITE 1.5 CAL,) LIQD Begin Osmolite 1.5 at 60 ml/hr for 12 hours nocturnal continuous feeding daily. Flush with 60 ml free water before and after continuous feedings. Continue 1 can of Osmolite 1.5 via gravity feeding TID with 60 ml free water before and after each bolus feeding. Please send pump to patient.  1422 mL  0  . omeprazole (PRILOSEC) 10 MG capsule Take 10 mg by mouth daily.      . ondansetron (ZOFRAN) 8 MG tablet Take 8 mg by mouth every 12 (twelve) hours as needed.      . prochlorperazine (COMPAZINE) 10 MG tablet Take 1 tablet (10 mg total) by mouth every 6 (six) hours as needed.  60 tablet  1  . sertraline (ZOLOFT) 50 MG tablet Take 1.5 tablets (75 mg total) by mouth daily.  60 tablet  0  . sodium fluoride (PREVIDENT 5000 PLUS) 1.1 % CREA dental cream Apply thin ribbon of cream to tooth brush. Brush teeth for 2 minutes. Spit out excess-DO NOT swallow. DO NOT rinse afterwards. Repeat nightly.  1 Tube  prn   No current facility-administered  medications for this visit.   Allergies:  Allergies  Allergen Reactions  . Codeine Phosphate     REACTION: rash, swelling  . Morphine And Related   . Tape     Blisters; please use paper tape    Past Medical History, Surgical history, Social history, and Family History were reviewed and updated.  Review of Systems: Constitutional:  Negative for fever, chills, night sweats, anorexia, weight loss, 4 out of 10 pain to left rib cage, neck  Cardiovascular: no chest pain or dyspnea on exertion Respiratory: no cough, shortness of breath, or wheezing Neurological: no TIA or stroke symptoms Dermatological: positive for rash negative for lumps ENT: negative for - oral lesions Skin: Negative. Gastrointestinal: no abdominal pain, change in bowel habits, or black or bloody stools; positive for trouble swallowing.  positive for - G tube Genito-Urinary: no dysuria, trouble voiding, or hematuria Hematological and Lymphatic: negative for - bleeding problems Musculoskeletal: negative for - gait disturbance Remaining ROS negative.  Physical Exam: BP 124/77  Pulse 106  Temp(Src) 98.6 F (37 C) (Oral)  Resp 18  Ht 6' (1.829 m)  Wt 169 lb 6.4 oz (76.839 kg)  BMI 22.97 kg/m2  SpO2 100% ECOG: 0 General appearance: alert, cooperative, appears stated age and no distress Head: Normocephalic, without obvious abnormality, atraumatic HEENT: Sclerae anicteric. Conjuncitvae pink. PERRLA; Oral mucosa moist without ulceration or thrush.  Neck: + cervical adenopathy about 1 cm rubbery and non-tender, supple, symmetrical, trachea midline and thyroid not enlarged, symmetric, no tenderness/mass/nodules. No subandibular adenopathy. S/p major neck surgery with neck scars well healed.  Lymph nodes: Cervical adenopathy: as noted above.  Supraclavicular adenopathy: None appreciated; No  Heart:regular rate and rhythm, S1, S2 normal, no murmur, click, rub or gallop Lung:chest clear, no wheezing, rales, normal  symmetric air entry, Heart exam - S1, S2 normal, no murmur, no gallop, rate regular Abdomin: soft, non-tender, without masses or organomegaly + G tube without signs of infections.  EXT: No peripheral edema; positive tenderness over left scapular, left chest wall.  Skin: Rash on face, back, chest is resolved. + Left Chest port-a-cath without tenderness or fluctuance   Lab Results: CBC    Component Value Date/Time   WBC 5.5 08/27/2013 1441   WBC 1.8* 08/20/2012 0846   RBC 3.00* 08/27/2013 1441   RBC 2.97* 08/20/2012 0846   HGB 9.0* 08/27/2013 1441   HGB 10.1* 08/20/2012 0846   HCT 27.8* 08/27/2013 1441   HCT 30.0* 08/20/2012 0846   PLT 257 08/27/2013 1441   PLT 99* 08/20/2012 0846   MCV 92.8 08/27/2013 1441   MCV 101.0* 08/20/2012 0846   MCH 30.1 08/27/2013 1441   MCH 34.0 08/20/2012 0846   MCHC 32.4 08/27/2013 1441   MCHC 33.7 08/20/2012 0846   RDW 18.1* 08/27/2013 1441   RDW 15.9* 08/20/2012 0846   LYMPHSABS 0.7* 08/27/2013 1441   LYMPHSABS 0.4* 08/20/2012 0846   MONOABS 0.6 08/27/2013 1441   MONOABS 0.1 08/20/2012 0846   EOSABS 0.3 08/27/2013 1441   EOSABS 0.1 08/20/2012 0846   BASOSABS 0.0 08/27/2013 1441   BASOSABS 0.0 08/20/2012 0846    CMP     Component Value Date/Time   NA 140 08/27/2013 1441   NA 138 06/25/2012 0858   K 4.0 08/27/2013 1441   K 3.1* 06/25/2012 0858   CL 102 02/25/2013 0753   CL 99 06/25/2012 0858   CO2 28 08/27/2013 1441   CO2 30 06/25/2012 0858   GLUCOSE 95 08/27/2013 1441   GLUCOSE 86 02/25/2013 0753   GLUCOSE 88 06/25/2012 0858   BUN 9.6 08/27/2013 1441   BUN 10 06/25/2012 0858   CREATININE 0.7 08/27/2013 1441   CREATININE 0.47* 06/25/2012 0858   CALCIUM 9.9 08/27/2013 1441   CALCIUM 9.6 06/25/2012 0858   PROT 7.8 08/27/2013 1441   PROT 6.8 05/03/2012 0905   ALBUMIN 3.4* 08/27/2013 1441   ALBUMIN 3.8 05/03/2012 0905   AST 17 08/27/2013 1441   AST 14 05/03/2012 0905   ALT 12 08/27/2013 1441   ALT 11 05/03/2012 0905   ALKPHOS 64 08/27/2013 1441    ALKPHOS 54 05/03/2012 0905   BILITOT <0.20 08/27/2013 1441   BILITOT 0.3 05/03/2012 0905   GFRNONAA >90 12/29/2011 0350   GFRAA >90 12/29/2011 0350   Radiological Studies: CT CHEST, ABDOMEN AND PELVIS WITH CONTRAST (05/16/2013) Technique: Multidetector CT imaging of the chest, abdomen and pelvis was performed following the standard protocol during bolus administration of intravenous contrast.  Contrast: OMNIPAQUE IOHEXOL 300 MG/ML SOLN  Comparison:  03/01/2013  CT CHEST  Findings: 4.3 x 3.2 cm cavitary lesion in the superior segment left lower lobe (series 5/image 24), previously 3.5 x 2.6 cm.  Increasing fluid within the lesion.  Moderate paraseptal emphysematous changes in the upper lobes.  No new/suspicious pulmonary nodules. No pleural effusion or pneumothorax.  Postprocedural changes in the lower left neck.  The visualized thyroid is unremarkable.  The heart is top normal in size. No pericardial effusion.  Left chest port.  No suspicious mediastinal, hilar, or axillary lymphadenopathy.  Surgical clips in the left axilla.  Mild degenerative changes of the thoracic spine.  IMPRESSION:  4.3 x 3.2 cm cavitary lesion/metastasis in the superior segment left lower lobe, mildly increased.  Moderate paraseptal emphysematous changes.  CT ABDOMEN AND PELVIS  Findings: Gastrostomy tube in satisfactory position.  Liver, pancreas, and adrenal glands are within normal limits.  5.9 x 4.1 cm rim calcified, multiloculated lesion in the medial spleen (series 2/image 63), unchanged.  Suspected cholelithiasis (series 2/image 75), without associated inflammatory changes.  1.5 x 1.2 cm medial right upper pole renal cyst (series 2/image  71). Left kidney is within normal limits. No hydronephrosis.  No evidence of bowel obstruction. Normal appendix.  Atherosclerotic calcifications of the abdominal aorta and branch  vessels.  No abdominopelvic ascites.  No suspicious abdominopelvic lymphadenopathy.   Prostate is at the upper limits of normal for size.  Bladder is within normal limits.  Degenerative changes of the lumbar spine. Known metastasis in the  left iliac bone is not evident on CT.  IMPRESSION:  Known metastasis in the left iliac bone is not evident on CT.  No evidence of new/progressive metastatic disease in the abdomen/pelvis.  Additional stable ancillary findings as above.  MRI CHEST WITHOUT AND WITH CONTRAST  TECHNIQUE:  Multiplanar multisequence MR imaging of the chest was performed both  before and after administration of intravenous contrast.  CONTRAST: 15mL MULTIHANCE GADOBENATE DIMEGLUMINE 529 MG/ML IV SOLN  COMPARISON: 05/16/2013 IMPRESSION: 1. Interval enlargement and chest wall invasion associated with the 5.6 cm superior segment left lower lobe mass, with tumor extending into the left 5th rib and the adjacent 4th and 5th intercostal spaces. Abnormal enhancement compatible with tumor extension tracking anteriorly in the 5th intercostal space between the 5th and 6th ribs. Abnormal soft tissue encases the left 5th rib, and there is early involvement of the left 4th rib as well.  2. Rim calcified multi-septated cystic mass in the spleen. Similar appearance to prior exams - no hypermetabolism on prior PET-CT.  3. Cholelithiasis.  CT C/A/P 08/22/2013 IMPRESSION:  1. Interval expansion of pleural-based lesion in the left lower lobe with new this adjacent parenchymal nodules is most consistent with progression of metastatic carcinoma.  2. New right infrahilar low-density lymph node is concerning for new nodal metastasis.  3. Skeletal metastasis in the left iliac bone seen on PET-CT scan is again not identified by CT.  Impression and Plan: ASSESSMENT AND PLAN: 1. Recurrent left lateral tongue squamous cell carcinoma: He now has metastatic disease to the lung and bone and left chest wall. --He has been seen by [facility] (Dr. Elaina Pattee) with  plans for clinical trial enrollment.   They reviewed his  [id number] clinical sequencing without identifiable actionablelesions.  We discussed his treatment options.  Given his other disease sites remained well controlled, he agreed to maintain the same regimen (carbo/cetuximab/docetaxel). 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  I also  provided Mr. Rosemond handouts detailing meta-analysis data that cetuximab-induced rash was associated with a significantly improved OS, PFS and ORR.  (Abdel-Rahman et. Molli Posey, 2014 Aug 12). Alternatively, if there is progression to the other sites, we will consider transitioning to a new therapy.  This would include  methotrexate or other agent such as gemcitabine or perhaps pemetrexed.  Patient voiced understanding the treatment options and agreed to proceed with carbo/cetuximab/docetaxel.  Handouts and detailed risk and benefits and indications have been provided on prior visits.  We plan to restage in 3 months.    We will re-start on 09/02/2013.  Next cycle on 12/29 then one week off (01/05).  He will follow up on 09/23/2012 for his next office visit and chemotherapy.    Oncology Flowsheet 09/02/2013  Day, Cycle Day 1, Cycle 36  CARBOplatin (PARAPLATIN) IV 180 mg  CARBOplatin test dose (PARAPLATIN) ID 100 mcg  cetuximab (ERBITUX) IV 150 mg/m2  dexamethasone (DECADRON) IV 20 mg  ondansetron (ZOFRAN) IV 16 mg  PACLitaxel (TAXOL) IV 48 mg/m2  zolendronic acid (ZOMETA) IV 4 mg    2. Skin rash secondary to Erbitux, resolved.  He is off erbitux. Cetuximab-induced skin rash may represent a prognostic factor in patients with advanced solid tumors.  3. 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  4. Bone met: limited. S/p palliative XRT. He has been on Zometa every 6 weeks. Zometa given on 06/24/2013 and will be given with next chemotherapy.  5. Anemia: We will follow him  symptomatically for  Transfusion.   6. Smoking: none at this time.  7. Dysphagia: Bariam swallow then referral to speech and swallow for evaluation.  8. Left chest wall pain, insomnia.   Lidocaine patch, fentanyl and hydrocodone prn.   Referral to pain clinic based on increasing use of narcotics.  9. Follow up: in 4 weeks on 09/23/2013 with labs and chemotherapy and zometa.    All questions were answered. The patient knows to call the clinic with any problems, questions or concerns. We can certainly see the patient much sooner if necessary.  Patient provided prescriptions for fentanyl patch.   Patient also was provided an after visit clinical summary.    I spent 25 minutes or more than half the time coordinating care during 40 minute follow-up visit.    Carlester Kasparek, MD 08/28/2013 7:53 pm

## 2013-08-28 NOTE — Telephone Encounter (Signed)
Per staff message and POF I have scheduled appts.  Advised scheduler to move lab JMW  

## 2013-08-28 NOTE — Progress Notes (Signed)
Prior authorization request received from [facility] for Lodocaine 5% patch-script written 08/27/13. Forwarded to managed care department.

## 2013-08-28 NOTE — Progress Notes (Signed)
Faxed pa form to [facility] for lodocaine patches.

## 2013-08-29 ENCOUNTER — Other Ambulatory Visit (HOSPITAL_COMMUNITY): Payer: Self-pay | Admitting: Internal Medicine

## 2013-08-29 ENCOUNTER — Encounter: Payer: Self-pay | Admitting: Internal Medicine

## 2013-08-29 ENCOUNTER — Other Ambulatory Visit: Payer: Self-pay | Admitting: Medical Oncology

## 2013-08-29 DIAGNOSIS — C770 Secondary and unspecified malignant neoplasm of lymph nodes of head, face and neck: Secondary | ICD-10-CM

## 2013-08-29 DIAGNOSIS — R1310 Dysphagia, unspecified: Secondary | ICD-10-CM

## 2013-08-29 DIAGNOSIS — C029 Malignant neoplasm of tongue, unspecified: Secondary | ICD-10-CM

## 2013-09-02 ENCOUNTER — Other Ambulatory Visit (HOSPITAL_BASED_OUTPATIENT_CLINIC_OR_DEPARTMENT_OTHER): Payer: Medicaid Other

## 2013-09-02 ENCOUNTER — Ambulatory Visit (HOSPITAL_BASED_OUTPATIENT_CLINIC_OR_DEPARTMENT_OTHER): Payer: Medicaid Other

## 2013-09-02 VITALS — BP 135/80 | HR 88 | Temp 97.9°F

## 2013-09-02 DIAGNOSIS — C029 Malignant neoplasm of tongue, unspecified: Secondary | ICD-10-CM

## 2013-09-02 DIAGNOSIS — C7951 Secondary malignant neoplasm of bone: Secondary | ICD-10-CM

## 2013-09-02 DIAGNOSIS — Z5111 Encounter for antineoplastic chemotherapy: Secondary | ICD-10-CM

## 2013-09-02 DIAGNOSIS — C770 Secondary and unspecified malignant neoplasm of lymph nodes of head, face and neck: Secondary | ICD-10-CM

## 2013-09-02 DIAGNOSIS — Z5112 Encounter for antineoplastic immunotherapy: Secondary | ICD-10-CM

## 2013-09-02 LAB — BASIC METABOLIC PANEL (CC13)
BUN: 9.9 mg/dL (ref 7.0–26.0)
CO2: 28 mEq/L (ref 22–29)
Calcium: 9.5 mg/dL (ref 8.4–10.4)
Chloride: 102 mEq/L (ref 98–109)
Creatinine: 0.6 mg/dL — ABNORMAL LOW (ref 0.7–1.3)
Glucose: 78 mg/dl (ref 70–140)

## 2013-09-02 LAB — CBC WITH DIFFERENTIAL/PLATELET
Basophils Absolute: 0.1 10*3/uL (ref 0.0–0.1)
EOS%: 5.8 % (ref 0.0–7.0)
Eosinophils Absolute: 0.3 10*3/uL (ref 0.0–0.5)
HCT: 29 % — ABNORMAL LOW (ref 38.4–49.9)
HGB: 8.9 g/dL — ABNORMAL LOW (ref 13.0–17.1)
LYMPH%: 12.8 % — ABNORMAL LOW (ref 14.0–49.0)
MCH: 28.9 pg (ref 27.2–33.4)
MCV: 94.2 fL (ref 79.3–98.0)
MONO#: 0.6 10*3/uL (ref 0.1–0.9)
NEUT#: 3.2 10*3/uL (ref 1.5–6.5)
NEUT%: 67.5 % (ref 39.0–75.0)
lymph#: 0.6 10*3/uL — ABNORMAL LOW (ref 0.9–3.3)

## 2013-09-02 MED ORDER — SODIUM CHLORIDE 0.9 % IV SOLN
48.0000 mg/m2 | Freq: Once | INTRAVENOUS | Status: AC
  Administered 2013-09-02: 90 mg via INTRAVENOUS
  Filled 2013-09-02: qty 15

## 2013-09-02 MED ORDER — DEXAMETHASONE SODIUM PHOSPHATE 20 MG/5ML IJ SOLN
INTRAMUSCULAR | Status: AC
  Filled 2013-09-02: qty 5

## 2013-09-02 MED ORDER — FAMOTIDINE IN NACL 20-0.9 MG/50ML-% IV SOLN
20.0000 mg | Freq: Once | INTRAVENOUS | Status: AC
  Administered 2013-09-02: 20 mg via INTRAVENOUS

## 2013-09-02 MED ORDER — ZOLEDRONIC ACID 4 MG/100ML IV SOLN
4.0000 mg | Freq: Once | INTRAVENOUS | Status: AC
  Administered 2013-09-02: 4 mg via INTRAVENOUS
  Filled 2013-09-02: qty 100

## 2013-09-02 MED ORDER — CETUXIMAB CHEMO IV INJECTION 200 MG/100ML
150.0000 mg/m2 | Freq: Once | INTRAVENOUS | Status: AC
  Administered 2013-09-02: 300 mg via INTRAVENOUS
  Filled 2013-09-02: qty 150

## 2013-09-02 MED ORDER — FAMOTIDINE IN NACL 20-0.9 MG/50ML-% IV SOLN
INTRAVENOUS | Status: AC
  Filled 2013-09-02: qty 50

## 2013-09-02 MED ORDER — DIPHENHYDRAMINE HCL 50 MG/ML IJ SOLN
INTRAMUSCULAR | Status: AC
  Filled 2013-09-02: qty 1

## 2013-09-02 MED ORDER — ONDANSETRON 16 MG/50ML IVPB (CHCC)
16.0000 mg | Freq: Once | INTRAVENOUS | Status: AC
  Administered 2013-09-02: 16 mg via INTRAVENOUS

## 2013-09-02 MED ORDER — DEXAMETHASONE SODIUM PHOSPHATE 20 MG/5ML IJ SOLN
20.0000 mg | Freq: Once | INTRAMUSCULAR | Status: AC
  Administered 2013-09-02: 20 mg via INTRAVENOUS

## 2013-09-02 MED ORDER — SODIUM CHLORIDE 0.9 % IJ SOLN
10.0000 mL | INTRAMUSCULAR | Status: DC | PRN
  Administered 2013-09-02: 10 mL
  Filled 2013-09-02: qty 10

## 2013-09-02 MED ORDER — HEPARIN SOD (PORK) LOCK FLUSH 100 UNIT/ML IV SOLN
500.0000 [IU] | Freq: Once | INTRAVENOUS | Status: AC | PRN
  Administered 2013-09-02: 500 [IU]
  Filled 2013-09-02: qty 5

## 2013-09-02 MED ORDER — DIPHENHYDRAMINE HCL 50 MG/ML IJ SOLN
50.0000 mg | Freq: Once | INTRAMUSCULAR | Status: AC
  Administered 2013-09-02: 50 mg via INTRAVENOUS

## 2013-09-02 MED ORDER — ONDANSETRON 16 MG/50ML IVPB (CHCC)
INTRAVENOUS | Status: AC
  Filled 2013-09-02: qty 16

## 2013-09-02 MED ORDER — SODIUM CHLORIDE 0.9 % IV SOLN
180.0000 mg | Freq: Once | INTRAVENOUS | Status: AC
  Administered 2013-09-02: 180 mg via INTRAVENOUS
  Filled 2013-09-02: qty 18

## 2013-09-02 MED ORDER — SODIUM CHLORIDE 0.9 % IV SOLN
Freq: Once | INTRAVENOUS | Status: AC
  Administered 2013-09-02: 11:00:00 via INTRAVENOUS

## 2013-09-02 MED ORDER — CARBOPLATIN CHEMO INTRADERMAL TEST DOSE 100MCG/0.02ML
100.0000 ug | Freq: Once | INTRADERMAL | Status: AC
  Administered 2013-09-02: 100 ug via INTRADERMAL
  Filled 2013-09-02: qty 0.01

## 2013-09-02 NOTE — Patient Instructions (Signed)
[facility] Discharge Instructions for Patients Receiving Chemotherapy  Today you received the following chemotherapy agents Erbitux/Taxol/Carboplatin/Zometa.  To help prevent nausea and vomiting after your treatment, we encourage you to take your nausea medication as prescribed.   If you develop nausea and vomiting that is not controlled by your nausea medication, call the clinic.   BELOW ARE SYMPTOMS THAT SHOULD BE REPORTED IMMEDIATELY:  *FEVER GREATER THAN 100.5 F  *CHILLS WITH OR WITHOUT FEVER  NAUSEA AND VOMITING THAT IS NOT CONTROLLED WITH YOUR NAUSEA MEDICATION  *UNUSUAL SHORTNESS OF BREATH  *UNUSUAL BRUISING OR BLEEDING  TENDERNESS IN MOUTH AND THROAT WITH OR WITHOUT PRESENCE OF ULCERS  *URINARY PROBLEMS  *BOWEL PROBLEMS  UNUSUAL RASH Items with * indicate a potential emergency and should be followed up as soon as possible.  Feel free to call the clinic you have any questions or concerns. The clinic phone number is [phone].   Zoledronic Acid injection (Hypercalcemia, Oncology) What is this medicine? ZOLEDRONIC ACID (ZOE le dron ik AS id) lowers the amount of calcium loss from bone. It is used to treat too much calcium in your blood from cancer. It is also used to prevent complications of cancer that has spread to the bone. This medicine may be used for other purposes; ask your health care provider or pharmacist if you have questions. COMMON BRAND NAME(S): Zometa What should I tell my health care provider before I take this medicine? They need to know if you have any of these conditions: -aspirin-sensitive asthma -cancer, especially if you are receiving medicines used to treat cancer -dental disease or wear dentures -infection -kidney disease -receiving corticosteroids like dexamethasone or prednisone -an unusual or allergic reaction to zoledronic acid, other medicines, foods, dyes, or preservatives -pregnant or trying to get  pregnant -breast-feeding How should I use this medicine? This medicine is for infusion into a vein. It is given by a health care professional in a hospital or clinic setting. Talk to your pediatrician regarding the use of this medicine in children. Special care may be needed. Overdosage: If you think you have taken too much of this medicine contact a poison control center or emergency room at once. NOTE: This medicine is only for you. Do not share this medicine with others. What if I miss a dose? It is important not to miss your dose. Call your doctor or health care professional if you are unable to keep an appointment. What may interact with this medicine? -certain antibiotics given by injection -NSAIDs, medicines for pain and inflammation, like ibuprofen or naproxen -some diuretics like bumetanide, furosemide -teriparatide -thalidomide This list may not describe all possible interactions. Give your health care provider a list of all the medicines, herbs, non-prescription drugs, or dietary supplements you use. Also tell them if you smoke, drink alcohol, or use illegal drugs. Some items may interact with your medicine. What should I watch for while using this medicine? Visit your doctor or health care professional for regular checkups. It may be some time before you see the benefit from this medicine. Do not stop taking your medicine unless your doctor tells you to. Your doctor may order blood tests or other tests to see how you are doing. Women should inform their doctor if they wish to become pregnant or think they might be pregnant. There is a potential for serious side effects to an unborn child. Talk to your health care professional or pharmacist for more information. You should make sure that you get  enough calcium and vitamin D while you are taking this medicine. Discuss the foods you eat and the vitamins you take with your health care professional. Some people who take this medicine have  severe bone, joint, and/or muscle pain. This medicine may also increase your risk for jaw problems or a broken thigh bone. Tell your doctor right away if you have severe pain in your jaw, bones, joints, or muscles. Tell your doctor if you have any pain that does not go away or that gets worse. Tell your dentist and dental surgeon that you are taking this medicine. You should not have major dental surgery while on this medicine. See your dentist to have a dental exam and fix any dental problems before starting this medicine. Take good care of your teeth while on this medicine. Make sure you see your dentist for regular follow-up appointments. What side effects may I notice from receiving this medicine? Side effects that you should report to your doctor or health care professional as soon as possible: -allergic reactions like skin rash, itching or hives, swelling of the face, lips, or tongue -anxiety, confusion, or depression -breathing problems -changes in vision -eye pain -feeling faint or lightheaded, falls -jaw pain, especially after dental work -mouth sores -muscle cramps, stiffness, or weakness -trouble passing urine or change in the amount of urine Side effects that usually do not require medical attention (report to your doctor or health care professional if they continue or are bothersome): -bone, joint, or muscle pain -constipation -diarrhea -fever -hair loss -irritation at site where injected -loss of appetite -nausea, vomiting -stomach upset -trouble sleeping -trouble swallowing -weak or tired This list may not describe all possible side effects. Call your doctor for medical advice about side effects. You may report side effects to FDA at 1-800-FDA-1088. Where should I keep my medicine? This drug is given in a hospital or clinic and will not be stored at home. NOTE: This sheet is a summary. It may not cover all possible information. If you have questions about this medicine,  talk to your doctor, pharmacist, or health care provider.  2014, [employer]/Gold Standard. (2013-02-07 13:03:13)

## 2013-09-03 ENCOUNTER — Ambulatory Visit (HOSPITAL_COMMUNITY)
Admission: RE | Admit: 2013-09-03 | Discharge: 2013-09-03 | Disposition: A | Discharge status: Home / Self Care | Payer: Medicaid Other | Source: Ambulatory Visit | Attending: Internal Medicine | Admitting: Internal Medicine

## 2013-09-03 DIAGNOSIS — R1310 Dysphagia, unspecified: Secondary | ICD-10-CM | POA: Insufficient documentation

## 2013-09-03 DIAGNOSIS — C770 Secondary and unspecified malignant neoplasm of lymph nodes of head, face and neck: Secondary | ICD-10-CM | POA: Insufficient documentation

## 2013-09-03 DIAGNOSIS — C029 Malignant neoplasm of tongue, unspecified: Secondary | ICD-10-CM | POA: Insufficient documentation

## 2013-09-03 NOTE — Procedures (Signed)
Objective Swallowing Evaluation: Modified Barium Swallowing Study  Patient Details  Name: Thomas Bean MRN: [id number] Date of Birth: [birth year]  Today's Date: 09/03/2013 Time: 1400-1450 SLP Time Calculation (min): 50 min  Past Medical History:  Past Medical History  Diagnosis Date  . Hemorrhoids   . Hypertension     no medications at present  . GERD (gastroesophageal reflux disease)     no medication  . Depression     treated approx 12 years ago, no treatment at this time  . Hiatal hernia   . Hx of radiation therapy 11/15/11 -01/05/12    left lateral tongue  . Lesion of left lung 03/14/12    CT chest, [facility], squamous cell carcinoma  . Facial rash 05/24/2012  . History of radiation therapy 07/24/12-07/26/12    left iliac bone, 18Gy/3 sessions  . Tongue cancer 12/2010    left side of tongue T1 lesion excised 4/12  . Lung metastasis dx'd 03/2012  . Cancer 08/2011    oral tongue  . Hx of radiation therapy 07/24/12- 07-26-12    left iliac bone, 18 Gy 3 sessions  . Hx of radiation therapy 11/15/11- 01/05/12    left neck, oral cavity6600 cGy 33 sessions  . Bone metastasis 04/25/12    PET scan-left iliac bone   Past Surgical History:  Past Surgical History  Procedure Laterality Date  . Inguinal herniorrhapy    . Tonsillectomy and adenoidectomy    . Excision of tongue mass      April 2012  . Hernia repair    . Neck mass excision  07/27/2011  . Radical neck dissection  07/27/2011    Procedure: RADICAL NECK DISSECTION;  Surgeon: Drema Halon, MD;  Location: [facility] OR;  Service: ENT;  Laterality: Left;  Marland Kitchen Mandibulectomy  09/2011    and scapular free flap  . 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  Pt  also with myocutaneous flap - tissue from scapula per pt.  Partial left glossectomy completed as well.  Radical neck disection due to recurrence of cancer in 03/2012.  Pt has undergone radiation and chemo treatment.  He received a PEG tube and relies on tube feeding for most of his nutrition.  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  Pt finished radiation to neck area approx 1 1/2 years ago and has recent radiation to chest area- completed approx 4-5 weeks ago.  Pt reports his dysphagia ongoing for the last 3-4 weeks with sensation of stasis in throat.  Pt does have h/o GERD and reports he takes an OTC PPI.  He does admit to improved swallowing today since he was recently given a steroid and is questioning if decreased edema improved swallow.  Pt complains of xerostomia due to sx, radiation and chemo and uses Biotene consistently.  Pt eats small amounts throughout the day of icecream, Ensure and water.  He denies choking during eating.  Does take medicine with water that will sometimes lodge in his pharynx.       Assessment / Plan / Recommendation Clinical Impression  Dysphagia Diagnosis: Moderate oral phase dysphagia;Mild pharyngeal phase dysphagia  Clinical impression:   Moderate oral and mild pharyngeal dysphagia noted with  sensorimotor impacts from head and neck cancer surgeries, radiation.  Partial glossectomy impacts pt's oral manipulation and transiting resulting in oral stasis that prematurely spills into pharynx without awareness.  Did not test solids due to pt's limited ROM from reconstruction and teeth not aligning.  Pharyngeal swallow was strong without severe stasis!  Epiglottis appears small, ? Due to radiation.  Barium tablet difficult for pt to orally transit and then lodged in pharynx, advised pt to take pills with pudding *start and follow with liquids.  Chin tuck and head turn attempted without improvement in swallow and worsened oral transit with  gravity impact.  Cued dry swallows (albeit hard for pt due to xerostomia) effectively aided clearance.  No aspiration or penetration noted.   SLP questions coorelation of time between pt's dysphagia symptoms and completion of radiation to chest.    Rec pt continue to consume creamy pureeds, thin, Ensure boluses throughout the day with strict precautions.  Also recommend pt start with intake with water and have good oral care/rinse mouth after intake.  Suspect pt may benefit from humidifer at home due to winter and dry air.    SLP encouraged pt to continue his exercises to maximize swallow ability and decrease fibrosis.  Pt thoroughly educated to findings using teach back for assurance to education.      Treatment Recommendation  Defer treatment plan to SLP at (Comment) (outpt)    Diet Recommendation Thin liquid;Dysphagia 1 (Puree)   Liquid Administration via: Cup;Straw Medication Administration: Crushed with puree (start with liquids, follow with liquids) Supervision: Patient able to self feed Compensations: Slow rate;Small sips/bites;Multiple dry swallows after each bite/sip;Follow solids with liquid Postural Changes and/or Swallow Maneuvers: Seated upright 90 degrees;Upright 30-60 min after meal    Other  Recommendations Recommended Consults: Other (Comment)  (encouraged pt to continue exercises, ? ability to use moist heat before exercises - defer to md,  ? 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  Pt also with myocutaneous flap - tissue from scapula per pt.  Partial left glossectomy completed  as well.  Radical neck disection due to recurrence of cancer in 03/2012.  Pt has undergone radiation and chemo treatment.  He received a PEG tube and relies on tube feeding for most of his nutrition.  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  Pt finished radiation to neck area approx 1 1/2 years ago and has recent radiation to chest area- completed approx 4-5 weeks ago.  Pt reports his dysphagia ongoing for the last 3-4 weeks with sensation of stasis in throat.  Pt does have h/o GERD and reports he takes an OTC PPI.  He does admit to improved swallowing today since he was recently given a steroid and is questioning if decreased edema improved swallow.  Pt complains of xerostomia due to sx, radiation and chemo and uses Biotene consistently.  Pt eats small amounts throughout the day of icecream, Ensure and water.  He denies choking during eating.  Does take medicine with water that will sometimes lodge  in his pharynx.   Type of Study: Modified Barium Swallowing Study Reason for Referral: Objectively evaluate swallowing function Diet Prior to this Study: Dysphagia 1 (puree);Thin liquids;Nectar-thick liquids (Ensure) Temperature Spikes Noted: N/A Respiratory Status: Room air Behavior/Cognition: Alert;Cooperative;Pleasant mood;Hard of hearing Oral Cavity - Dentition: Adequate natural dentition Oral Motor / Sensory Function: Impaired motor;Impaired sensory (pt with extensive surgery, radiation effects, etc) Oral impairment: Left lingual;Left facial;Left labial Self-Feeding Abilities: Able to feed self Patient Positioning: Upright in chair Baseline Vocal Quality: Clear Volitional Cough: Strong Volitional Swallow: Able to elicit Anatomy: Other (Comment) (extensive surgery for cancer)    Reason for Referral Objectively evaluate swallowing function   Oral Phase Oral Preparation/Oral Phase Oral Phase: Impaired Oral - Nectar Oral - Nectar Cup: Piecemeal swallowing;Weak  lingual manipulation;Lingual/palatal residue;Delayed oral transit;Reduced posterior propulsion Oral - Thin Oral - Thin Teaspoon: Lingual/palatal residue;Delayed oral transit;Reduced posterior propulsion;Piecemeal swallowing;Weak lingual manipulation Oral - Thin Cup: Piecemeal swallowing;Weak lingual manipulation;Lingual/palatal residue;Delayed oral transit;Reduced posterior propulsion Oral - Thin Straw: Lingual/palatal residue;Delayed oral transit;Reduced posterior propulsion;Piecemeal swallowing;Weak lingual manipulation Oral - Solids Oral - Puree: Weak lingual manipulation;Delayed oral transit;Reduced posterior propulsion Oral - Pill: Weak lingual manipulation;Delayed oral transit;Lingual pumping;Reduced posterior propulsion;Piecemeal swallowing Oral Phase - Comment Oral Phase - Comment: oral stasis present without pt awareness that prematurely spills into pharynx, cued swallows aid clearance   Pharyngeal Phase Pharyngeal Phase Pharyngeal Phase: Impaired Pharyngeal - Nectar Pharyngeal - Nectar Cup: Reduced laryngeal elevation;Reduced tongue base retraction Pharyngeal - Thin Pharyngeal - Thin Teaspoon: Reduced laryngeal elevation;Reduced tongue base retraction Pharyngeal - Thin Cup: Reduced laryngeal elevation Pharyngeal - Thin Straw: Reduced laryngeal elevation;Reduced tongue base retraction Pharyngeal - Solids Pharyngeal - Puree: Reduced laryngeal elevation;Reduced tongue base retraction Pharyngeal - Pill: Reduced laryngeal elevation;Reduced tongue base retraction;Pharyngeal residue - valleculae (pill with liquid lodged at valleculae,pudding cleared) Pharyngeal Phase - Comment Pharyngeal Comment: pt appeared with decreased size of epiglottis - suspect radiation impact,  liquids spill from oral cavity into pharynx without pt sensation to swallow to clear, cued swallows facilitate clearance  Cervical Esophageal Phase    GO    Cervical Esophageal Phase Cervical Esophageal Phase:  Impaired Cervical Esophageal Phase - Comment Cervical Esophageal Comment: appeared with adequate clearance of barium, appearance of mild distal stasis x1    Functional Assessment Tool Used: mbs, clinical judgement Functional Limitations: Swallowing Swallow Current Status (O1308): At least 40 percent but less than 60 percent impaired, limited or restricted Swallow Goal Status [phone]): At least 40 percent but less than 60 percent impaired, limited or restricted Swallow Discharge Status [phone]): At least 40 percent but less than 60 percent impaired, limited or restricted    Chales Abrahams 09/03/2013, 3:59 PM

## 2013-09-09 ENCOUNTER — Other Ambulatory Visit: Payer: Self-pay | Admitting: Internal Medicine

## 2013-09-09 ENCOUNTER — Ambulatory Visit (HOSPITAL_BASED_OUTPATIENT_CLINIC_OR_DEPARTMENT_OTHER): Payer: Medicaid Other

## 2013-09-09 ENCOUNTER — Other Ambulatory Visit: Payer: Self-pay | Admitting: Medical Oncology

## 2013-09-09 ENCOUNTER — Other Ambulatory Visit (HOSPITAL_BASED_OUTPATIENT_CLINIC_OR_DEPARTMENT_OTHER): Payer: Medicaid Other

## 2013-09-09 ENCOUNTER — Other Ambulatory Visit (HOSPITAL_COMMUNITY): Payer: Self-pay | Admitting: Internal Medicine

## 2013-09-09 VITALS — BP 112/75 | HR 107 | Temp 98.2°F | Resp 20

## 2013-09-09 DIAGNOSIS — C029 Malignant neoplasm of tongue, unspecified: Secondary | ICD-10-CM

## 2013-09-09 DIAGNOSIS — C7951 Secondary malignant neoplasm of bone: Secondary | ICD-10-CM

## 2013-09-09 DIAGNOSIS — Z5111 Encounter for antineoplastic chemotherapy: Secondary | ICD-10-CM

## 2013-09-09 DIAGNOSIS — C770 Secondary and unspecified malignant neoplasm of lymph nodes of head, face and neck: Secondary | ICD-10-CM

## 2013-09-09 DIAGNOSIS — Z5112 Encounter for antineoplastic immunotherapy: Secondary | ICD-10-CM

## 2013-09-09 LAB — CBC WITH DIFFERENTIAL/PLATELET
BASO%: 1 % (ref 0.0–2.0)
Basophils Absolute: 0.1 10*3/uL (ref 0.0–0.1)
Eosinophils Absolute: 0.4 10*3/uL (ref 0.0–0.5)
HCT: 27.1 % — ABNORMAL LOW (ref 38.4–49.9)
HGB: 8.9 g/dL — ABNORMAL LOW (ref 13.0–17.1)
MCH: 30.3 pg (ref 27.2–33.4)
MCHC: 32.8 g/dL (ref 32.0–36.0)
MONO#: 0.5 10*3/uL (ref 0.1–0.9)
NEUT#: 3.6 10*3/uL (ref 1.5–6.5)
NEUT%: 73 % (ref 39.0–75.0)
RBC: 2.94 10*6/uL — ABNORMAL LOW (ref 4.20–5.82)
WBC: 5 10*3/uL (ref 4.0–10.3)
lymph#: 0.4 10*3/uL — ABNORMAL LOW (ref 0.9–3.3)

## 2013-09-09 LAB — BASIC METABOLIC PANEL (CC13)
Anion Gap: 8 mEq/L (ref 3–11)
BUN: 9.5 mg/dL (ref 7.0–26.0)
CO2: 28 mEq/L (ref 22–29)
Chloride: 101 mEq/L (ref 98–109)
Creatinine: 0.7 mg/dL (ref 0.7–1.3)
Glucose: 103 mg/dl (ref 70–140)

## 2013-09-09 MED ORDER — ONDANSETRON 16 MG/50ML IVPB (CHCC)
16.0000 mg | Freq: Once | INTRAVENOUS | Status: AC
  Administered 2013-09-09: 16 mg via INTRAVENOUS

## 2013-09-09 MED ORDER — DEXAMETHASONE SODIUM PHOSPHATE 20 MG/5ML IJ SOLN
20.0000 mg | Freq: Once | INTRAMUSCULAR | Status: AC
  Administered 2013-09-09: 20 mg via INTRAVENOUS

## 2013-09-09 MED ORDER — SODIUM CHLORIDE 0.9 % IJ SOLN
10.0000 mL | INTRAMUSCULAR | Status: DC | PRN
  Administered 2013-09-09: 10 mL
  Filled 2013-09-09: qty 10

## 2013-09-09 MED ORDER — DIPHENHYDRAMINE HCL 50 MG/ML IJ SOLN: 50.0000 mg | Freq: Once | INTRAMUSCULAR | Status: AC

## 2013-09-09 MED ORDER — DIPHENHYDRAMINE HCL 50 MG/ML IJ SOLN
INTRAMUSCULAR | Status: AC
  Filled 2013-09-09: qty 1

## 2013-09-09 MED ORDER — ONDANSETRON 16 MG/50ML IVPB (CHCC)
INTRAVENOUS | Status: AC
  Filled 2013-09-09: qty 16

## 2013-09-09 MED ORDER — CETUXIMAB CHEMO IV INJECTION 200 MG/100ML
150.0000 mg/m2 | Freq: Once | INTRAVENOUS | Status: AC
  Administered 2013-09-09: 300 mg via INTRAVENOUS
  Filled 2013-09-09: qty 150

## 2013-09-09 MED ORDER — FAMOTIDINE IN NACL 20-0.9 MG/50ML-% IV SOLN
INTRAVENOUS | Status: AC
  Filled 2013-09-09: qty 50

## 2013-09-09 MED ORDER — SODIUM CHLORIDE 0.9 % IV SOLN
180.0000 mg | Freq: Once | INTRAVENOUS | Status: AC
  Administered 2013-09-09: 180 mg via INTRAVENOUS
  Filled 2013-09-09: qty 18

## 2013-09-09 MED ORDER — FAMOTIDINE IN NACL 20-0.9 MG/50ML-% IV SOLN
20.0000 mg | Freq: Once | INTRAVENOUS | Status: AC
  Administered 2013-09-09: 20 mg via INTRAVENOUS

## 2013-09-09 MED ORDER — SODIUM CHLORIDE 0.9 % IV SOLN
48.0000 mg/m2 | Freq: Once | INTRAVENOUS | Status: AC
  Administered 2013-09-09: 90 mg via INTRAVENOUS
  Filled 2013-09-09: qty 15

## 2013-09-09 MED ORDER — DEXAMETHASONE SODIUM PHOSPHATE 20 MG/5ML IJ SOLN
INTRAMUSCULAR | Status: AC
  Filled 2013-09-09: qty 5

## 2013-09-09 MED ORDER — SODIUM CHLORIDE 0.9 % IV SOLN
Freq: Once | INTRAVENOUS | Status: AC
  Administered 2013-09-09: 08:00:00 via INTRAVENOUS

## 2013-09-09 MED ORDER — HEPARIN SOD (PORK) LOCK FLUSH 100 UNIT/ML IV SOLN
500.0000 [IU] | Freq: Once | INTRAVENOUS | Status: AC | PRN
  Administered 2013-09-09: 500 [IU]
  Filled 2013-09-09: qty 5

## 2013-09-09 MED ORDER — CARBOPLATIN CHEMO INTRADERMAL TEST DOSE 100MCG/0.02ML
100.0000 ug | Freq: Once | INTRADERMAL | Status: AC
  Administered 2013-09-09: 100 ug via INTRADERMAL
  Filled 2013-09-09: qty 0.01

## 2013-09-09 MED ORDER — LORAZEPAM 0.5 MG PO TABS: 0.5000 mg | ORAL_TABLET | Freq: Four times a day (QID) | ORAL | Status: DC | PRN

## 2013-09-09 MED ORDER — DIPHENHYDRAMINE HCL 50 MG/ML IJ SOLN
50.0000 mg | Freq: Once | INTRAMUSCULAR | Status: AC
  Administered 2013-09-09: 50 mg via INTRAVENOUS

## 2013-09-09 NOTE — Patient Instructions (Signed)
[facility] Discharge Instructions for Patients Receiving Chemotherapy  Today you received the following chemotherapy agents Erbitux/Taxol/Carboplatin To help prevent nausea and vomiting after your treatment, we encourage you to take your nausea medication as prescribed.  If you develop nausea and vomiting that is not controlled by your nausea medication, call the clinic.   BELOW ARE SYMPTOMS THAT SHOULD BE REPORTED IMMEDIATELY:  *FEVER GREATER THAN 100.5 F  *CHILLS WITH OR WITHOUT FEVER  NAUSEA AND VOMITING THAT IS NOT CONTROLLED WITH YOUR NAUSEA MEDICATION  *UNUSUAL SHORTNESS OF BREATH  *UNUSUAL BRUISING OR BLEEDING  TENDERNESS IN MOUTH AND THROAT WITH OR WITHOUT PRESENCE OF ULCERS  *URINARY PROBLEMS  *BOWEL PROBLEMS  UNUSUAL RASH Items with * indicate a potential emergency and should be followed up as soon as possible.  Feel free to call the clinic you have any questions or concerns. The clinic phone number is [phone].

## 2013-09-18 ENCOUNTER — Encounter: Payer: Self-pay | Admitting: Radiation Oncology

## 2013-09-19 ENCOUNTER — Ambulatory Visit
Admission: RE | Admit: 2013-09-19 | Discharge: 2013-09-19 | Disposition: A | Discharge status: Home / Self Care | Payer: Medicaid Other | Source: Ambulatory Visit | Attending: Radiation Oncology | Admitting: Radiation Oncology

## 2013-09-19 VITALS — BP 138/80 | HR 98 | Temp 98.1°F | Resp 20 | Wt 175.3 lb

## 2013-09-19 DIAGNOSIS — C7951 Secondary malignant neoplasm of bone: Secondary | ICD-10-CM

## 2013-09-19 DIAGNOSIS — C029 Malignant neoplasm of tongue, unspecified: Secondary | ICD-10-CM

## 2013-09-19 HISTORY — DX: Personal history of irradiation: Z92.3

## 2013-09-19 HISTORY — DX: Personal history of antineoplastic chemotherapy: Z92.21

## 2013-09-19 MED ORDER — LORAZEPAM 0.5 MG PO TABS
0.5000 mg | ORAL_TABLET | Freq: Four times a day (QID) | ORAL | Status: DC | PRN
Start: 2013-09-19 — End: 2013-10-01

## 2013-09-19 NOTE — Progress Notes (Signed)
Department of Radiation Oncology  Phone:  [phone] Fax:        [phone]   Name: Thomas Bean MRN: [id number]  DOB: 1960/10/05  Date: 09/19/2013  Follow Up Visit Note  Diagnosis: Metastatic head and neck cancer  Summary and Interval since last radiation: 30 Gy in 10 Fractions to the left ribs completed 07/2013  Interval History: Thomas Bean presents today for routine followup.  He is feeling well and doing well. He states experienced a significant decrease in the pain in his left rib. He cannot sleep better and can move around without excruciating pain. He said this happened about 2 weeks and then further at a month after his radiation was over. He still is taking hydrocodone occasionally. He still on Duragesic and hydrocodone although this is mostly for his left facial pain. He also continues on his Neurontin. He is taking Erbitux. He had a CT in December which showed the mass in the left ribs it completely replaced by fluid as well as to possible satellite nodules. I reviewed this imaging.  Allergies:  Allergies  Allergen Reactions  . Codeine Phosphate     REACTION: rash, swelling  . Morphine And Related   . Tape     Blisters; please use paper tape    Medications:  Current Outpatient Prescriptions  Medication Sig Dispense Refill  . clindamycin (CLINDAGEL) 1 % gel Apply topically as needed. Apply to facial and chest skin rash.  30 g  3  . fentaNYL (DURAGESIC - DOSED MCG/HR) 25 MCG/HR patch Place 1 patch (25 mcg total) onto the skin every 3 (three) days.  10 patch  0  . fentaNYL (DURAGESIC - DOSED MCG/HR) 50 MCG/HR Place 1 patch (50 mcg total) onto the skin every 3 (three) days.  10 patch  0  . gabapentin (NEURONTIN) 300 MG capsule Take one capsule in the am, 2 capsules at noon and 3 capsules at bedtime.  180 capsule  3  . HYDROcodone-acetaminophen (NORCO/VICODIN) 5-325 MG per tablet Take 1 to 2 tablets every 4 hours as needed for pain.  180 tablet  0  .  lidocaine-prilocaine (EMLA) cream Apply topically as needed. Apply to Thomas Bean site one hour prior to needle stick to numb skin.  30 g  3  . LORazepam (ATIVAN) 0.5 MG tablet Take 1 tablet (0.5 mg total) by mouth every 6 (six) hours as needed for anxiety.  60 tablet  0  . Nutritional Supplements (FEEDING SUPPLEMENT, OSMOLITE 1.5 CAL,) LIQD Begin Osmolite 1.5 at 60 ml/hr for 12 hours nocturnal continuous feeding daily. Flush with 60 ml free water before and after continuous feedings. Continue 1 can of Osmolite 1.5 via gravity feeding TID with 60 ml free water before and after each bolus feeding. Please send pump to patient.  1422 mL  0  . omeprazole (PRILOSEC) 10 MG capsule Take 10 mg by mouth daily.      . ondansetron (ZOFRAN) 8 MG tablet Take 8 mg by mouth every 12 (twelve) hours as needed.      . polyethylene glycol (MIRALAX / GLYCOLAX) packet Take 17 g by mouth 3 (three) times a week.      . prochlorperazine (COMPAZINE) 10 MG tablet Take 1 tablet (10 mg total) by mouth every 6 (six) hours as needed.  60 tablet  1  . sertraline (ZOLOFT) 100 MG tablet Take 1 tablet (100 mg total) by mouth daily.  90 tablet  3  . lidocaine (LIDODERM) 5 % Place 1 patch  onto the skin daily. Remove & Discard patch within 12 hours or as directed by MD  30 patch  0   No current facility-administered medications for this encounter.    Physical Exam:  Filed Vitals:   09/19/13 1311  BP: 138/80  Pulse: 98  Temp: 98.1 F (36.7 C)  Resp: 20  Weight: 175 lb 4.8 oz (79.516 kg)  SpO2: 100%   he has a rash throughout his entire face. He is alert and oriented x3. He appears much better than he did end of treatment.  IMPRESSION: Thomas Bean is a 53 y.o. male who has responded well from a pain standpoint of palliative radiation to the left chest wall.  PLAN:  I've gone over his imaging findings. These to "satellite nodules" near his primary tumor are in the radiation field so his skin was only about 2 weeks after radiation  was complete. Subtotally unheard of that these could be radiation change in may disappear with his next scan. I will continue to be available for any palliation if needed. I refilled his Ativan at his request.    Thea Silversmith, MD

## 2013-09-19 NOTE — Progress Notes (Signed)
Patient here for routine follow up completion of radiation 07/20/13.Mild pain which feels more "like bruise".resumed taking erbitux about 3 weeks ago.Overall feels better, no increased shortness of breath or cough.Still on duragesic 75 mcg.needs refill on lorazepam.

## 2013-09-22 ENCOUNTER — Encounter: Payer: Self-pay | Admitting: Internal Medicine

## 2013-09-23 ENCOUNTER — Encounter: Payer: Self-pay | Admitting: *Deleted

## 2013-09-23 ENCOUNTER — Other Ambulatory Visit (HOSPITAL_BASED_OUTPATIENT_CLINIC_OR_DEPARTMENT_OTHER): Payer: Medicaid Other

## 2013-09-23 ENCOUNTER — Ambulatory Visit (HOSPITAL_BASED_OUTPATIENT_CLINIC_OR_DEPARTMENT_OTHER): Payer: Medicaid Other

## 2013-09-23 ENCOUNTER — Ambulatory Visit (HOSPITAL_BASED_OUTPATIENT_CLINIC_OR_DEPARTMENT_OTHER): Payer: Medicaid Other | Admitting: Internal Medicine

## 2013-09-23 ENCOUNTER — Encounter: Payer: Self-pay | Admitting: Internal Medicine

## 2013-09-23 VITALS — BP 137/75 | HR 93 | Temp 97.9°F | Resp 20 | Ht 72.0 in | Wt 174.3 lb

## 2013-09-23 DIAGNOSIS — C779 Secondary and unspecified malignant neoplasm of lymph node, unspecified: Secondary | ICD-10-CM

## 2013-09-23 DIAGNOSIS — G4700 Insomnia, unspecified: Secondary | ICD-10-CM

## 2013-09-23 DIAGNOSIS — C50919 Malignant neoplasm of unspecified site of unspecified female breast: Secondary | ICD-10-CM

## 2013-09-23 DIAGNOSIS — C029 Malignant neoplasm of tongue, unspecified: Secondary | ICD-10-CM

## 2013-09-23 DIAGNOSIS — C7951 Secondary malignant neoplasm of bone: Secondary | ICD-10-CM

## 2013-09-23 DIAGNOSIS — C7800 Secondary malignant neoplasm of unspecified lung: Secondary | ICD-10-CM

## 2013-09-23 DIAGNOSIS — Z5111 Encounter for antineoplastic chemotherapy: Secondary | ICD-10-CM

## 2013-09-23 DIAGNOSIS — C7952 Secondary malignant neoplasm of bone marrow: Secondary | ICD-10-CM

## 2013-09-23 DIAGNOSIS — C770 Secondary and unspecified malignant neoplasm of lymph nodes of head, face and neck: Secondary | ICD-10-CM

## 2013-09-23 DIAGNOSIS — G609 Hereditary and idiopathic neuropathy, unspecified: Secondary | ICD-10-CM

## 2013-09-23 LAB — COMPREHENSIVE METABOLIC PANEL (CC13)
ALK PHOS: 64 U/L (ref 40–150)
ALT: 13 U/L (ref 0–55)
AST: 21 U/L (ref 5–34)
Albumin: 3.3 g/dL — ABNORMAL LOW (ref 3.5–5.0)
Anion Gap: 12 mEq/L — ABNORMAL HIGH (ref 3–11)
BILIRUBIN TOTAL: 0.25 mg/dL (ref 0.20–1.20)
BUN: 8.4 mg/dL (ref 7.0–26.0)
CO2: 24 mEq/L (ref 22–29)
Calcium: 9.4 mg/dL (ref 8.4–10.4)
Chloride: 104 mEq/L (ref 98–109)
Creatinine: 0.7 mg/dL (ref 0.7–1.3)
GLUCOSE: 114 mg/dL (ref 70–140)
Potassium: 3.7 mEq/L (ref 3.5–5.1)
Sodium: 140 mEq/L (ref 136–145)
TOTAL PROTEIN: 7.3 g/dL (ref 6.4–8.3)

## 2013-09-23 LAB — CBC WITH DIFFERENTIAL/PLATELET
BASO%: 0.6 % (ref 0.0–2.0)
Basophils Absolute: 0 10*3/uL (ref 0.0–0.1)
EOS%: 1.7 % (ref 0.0–7.0)
Eosinophils Absolute: 0.1 10*3/uL (ref 0.0–0.5)
HCT: 29.5 % — ABNORMAL LOW (ref 38.4–49.9)
HGB: 9.1 g/dL — ABNORMAL LOW (ref 13.0–17.1)
LYMPH%: 12.2 % — AB (ref 14.0–49.0)
MCH: 29.1 pg (ref 27.2–33.4)
MCHC: 30.8 g/dL — AB (ref 32.0–36.0)
MCV: 94.2 fL (ref 79.3–98.0)
MONO#: 0.4 10*3/uL (ref 0.1–0.9)
MONO%: 9.1 % (ref 0.0–14.0)
NEUT#: 3.7 10*3/uL (ref 1.5–6.5)
NEUT%: 76.4 % — ABNORMAL HIGH (ref 39.0–75.0)
PLATELETS: 234 10*3/uL (ref 140–400)
RBC: 3.13 10*6/uL — ABNORMAL LOW (ref 4.20–5.82)
RDW: 17.5 % — ABNORMAL HIGH (ref 11.0–14.6)
WBC: 4.8 10*3/uL (ref 4.0–10.3)
lymph#: 0.6 10*3/uL — ABNORMAL LOW (ref 0.9–3.3)

## 2013-09-23 LAB — MAGNESIUM (CC13): MAGNESIUM: 2.5 mg/dL (ref 1.5–2.5)

## 2013-09-23 MED ORDER — DEXAMETHASONE SODIUM PHOSPHATE 20 MG/5ML IJ SOLN: 20.0000 mg | Freq: Once | INTRAMUSCULAR | Status: DC

## 2013-09-23 MED ORDER — SODIUM CHLORIDE 0.9 % IV SOLN
Freq: Once | INTRAVENOUS | Status: AC
  Administered 2013-09-23: 13:00:00 via INTRAVENOUS

## 2013-09-23 MED ORDER — HYDROCODONE-ACETAMINOPHEN 5-325 MG PO TABS: ORAL_TABLET | ORAL | Status: DC

## 2013-09-23 MED ORDER — DEXAMETHASONE SODIUM PHOSPHATE 10 MG/ML IJ SOLN
INTRAMUSCULAR | Status: AC
  Filled 2013-09-23: qty 2

## 2013-09-23 MED ORDER — ONDANSETRON 16 MG/50ML IVPB (CHCC)
INTRAVENOUS | Status: AC
  Filled 2013-09-23: qty 16

## 2013-09-23 MED ORDER — DEXAMETHASONE SODIUM PHOSPHATE 20 MG/5ML IJ SOLN
20.0000 mg | Freq: Once | INTRAMUSCULAR | Status: AC
  Administered 2013-09-23: 20 mg via INTRAVENOUS

## 2013-09-23 MED ORDER — DIPHENHYDRAMINE HCL 50 MG/ML IJ SOLN: 50.0000 mg | Freq: Once | INTRAMUSCULAR | Status: DC

## 2013-09-23 MED ORDER — CARBOPLATIN CHEMO INTRADERMAL TEST DOSE 100MCG/0.02ML
100.0000 ug | Freq: Once | INTRADERMAL | Status: AC
  Administered 2013-09-23: 100 ug via INTRADERMAL
  Filled 2013-09-23: qty 0.01

## 2013-09-23 MED ORDER — SODIUM CHLORIDE 0.9 % IV SOLN
180.0000 mg | Freq: Once | INTRAVENOUS | Status: AC
  Administered 2013-09-23: 180 mg via INTRAVENOUS
  Filled 2013-09-23: qty 18

## 2013-09-23 MED ORDER — SODIUM CHLORIDE 0.9 % IV SOLN
48.0000 mg/m2 | Freq: Once | INTRAVENOUS | Status: AC
  Administered 2013-09-23: 90 mg via INTRAVENOUS
  Filled 2013-09-23: qty 15

## 2013-09-23 MED ORDER — FAMOTIDINE IN NACL 20-0.9 MG/50ML-% IV SOLN
20.0000 mg | Freq: Once | INTRAVENOUS | Status: AC
  Administered 2013-09-23: 20 mg via INTRAVENOUS

## 2013-09-23 MED ORDER — CETUXIMAB CHEMO IV INJECTION 200 MG/100ML
150.0000 mg/m2 | Freq: Once | INTRAVENOUS | Status: AC
  Administered 2013-09-23: 300 mg via INTRAVENOUS
  Filled 2013-09-23: qty 150

## 2013-09-23 MED ORDER — DIPHENHYDRAMINE HCL 50 MG/ML IJ SOLN
INTRAMUSCULAR | Status: AC
  Filled 2013-09-23: qty 1

## 2013-09-23 MED ORDER — DIPHENHYDRAMINE HCL 50 MG/ML IJ SOLN
50.0000 mg | Freq: Once | INTRAMUSCULAR | Status: AC
  Administered 2013-09-23: 50 mg via INTRAVENOUS

## 2013-09-23 MED ORDER — FAMOTIDINE IN NACL 20-0.9 MG/50ML-% IV SOLN
INTRAVENOUS | Status: AC
  Filled 2013-09-23: qty 50

## 2013-09-23 MED ORDER — ONDANSETRON 16 MG/50ML IVPB (CHCC)
16.0000 mg | Freq: Once | INTRAVENOUS | Status: AC
  Administered 2013-09-23: 16 mg via INTRAVENOUS

## 2013-09-23 MED ORDER — SODIUM CHLORIDE 0.9 % IJ SOLN
10.0000 mL | INTRAMUSCULAR | Status: DC | PRN
  Administered 2013-09-23: 10 mL
  Filled 2013-09-23: qty 10

## 2013-09-23 MED ORDER — HEPARIN SOD (PORK) LOCK FLUSH 100 UNIT/ML IV SOLN
500.0000 [IU] | Freq: Once | INTRAVENOUS | Status: AC | PRN
  Administered 2013-09-23: 500 [IU]
  Filled 2013-09-23: qty 5

## 2013-09-23 NOTE — Progress Notes (Signed)
Chaplain made initial visit. Pt shared about his cancer journey, current treatment, and side effects. He stated that he has repeatedly outlived his prognosis and says that despite the side effects, the treatment is "worth it." Pt has a local church community that checks on him periodically. He lives with his parents and has a son living in a nearby town, plus one grandson. Pt seems to have a very positive attitude. Chaplain facilitated the pt's processing of his journey, providing emotional support and reflective listening. Chaplain will follow up as necessary.

## 2013-09-23 NOTE — Patient Instructions (Signed)
[facility] Discharge Instructions for Patients Receiving Chemotherapy  Today you received the following chemotherapy agents Erbitux, Taxol and Carboplatin.  To help prevent nausea and vomiting after your treatment, we encourage you to take your nausea medication as prescribed.   If you develop nausea and vomiting that is not controlled by your nausea medication, call the clinic.   BELOW ARE SYMPTOMS THAT SHOULD BE REPORTED IMMEDIATELY:  *FEVER GREATER THAN 100.5 F  *CHILLS WITH OR WITHOUT FEVER  NAUSEA AND VOMITING THAT IS NOT CONTROLLED WITH YOUR NAUSEA MEDICATION  *UNUSUAL SHORTNESS OF BREATH  *UNUSUAL BRUISING OR BLEEDING  TENDERNESS IN MOUTH AND THROAT WITH OR WITHOUT PRESENCE OF ULCERS  *URINARY PROBLEMS  *BOWEL PROBLEMS  UNUSUAL RASH Items with * indicate a potential emergency and should be followed up as soon as possible.  Feel free to call the clinic you have any questions or concerns. The clinic phone number is [phone].

## 2013-09-24 NOTE — Progress Notes (Signed)
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 He developed biopsy proven metastatic disease in 03/2012 with met in right neck, left lung mass and left illac.   Prior Therapy:  1. 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 He developed recurrence in the left submandibular gland in August 2012. He underwent on 07/27/2011 radical left neck dissection by Dr. Lucia Gaskins and Dr. Riki Altes. Pathology case number [id number] showed total 15 lymph nodes from left radical neck dissection that was all negative. However the left submandibular gland mass showed a 3.5 cm invasive SCC, moderately differentiated involving the adjacent skeletal muscle tissue and bone a tissue and extending into the inked margin. There was no evidence of any lymphatic invasion or perineural invasion. He underwent further resection by Dr. Caryl Never at [facility] on 09/29/2011.  2. He received adjuvant chemoradiation therapy with q 3 week Cisplatin given 11/15/11-12/05/11 (received 2 of 3 doses; 3rd dose held due to febrile neutropenia and development of flap abscess). He received concurrent XRT 11/15/11 through 01/05/12.  3. For right neck recurrence in 03/2012; he underwent palliative right neck dissection by Dr. Owens Shark at [facility].   Current therapy:  Started on 05/15/2012 palliative chemo weekly Carboplatin/Taxol/Erbitux; 3 weeks on; 1 week off. Due to prolonged cytopenia, his chemo had been changed to weekly; 2 weeks on, 1 week off.   He is on zometa q 4 weeks (last dose 08/27/2013). Palliative XRT to the left chest wall 30 gy at 3 Gy per fraction x 10 fractions started on 07/17/2013 and  completed on 07/30/2013.   Interim History:  Thomas Bean 53 y.o. male returns for regular follow up visit. He was seen by me on 12/16//2014.  Patient had been followed Dr. Pablo Ledger palliative XRT to his left chest wall based on his recent MRI of chest findings consistent with progressive disease with 5.5 x 5.6 left lower lobe mass invading the left chest wall into the fourth and fifth rib.   He underwent simulation of 10/28. He completed XRT on 07/30/13.  His pain medications have been increased.  His fentanyl 50 mcg/hr to 75 mcg/hr.   In addition, he was seen by Dr. Marcille Blanco of [facility] 10/27 regarding review of his case for consideration of clinical trial enrollment.  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  With the exception of EGFR, no other actionable mutations were identified.   He was restarted on his carboplatin/taxol/erbitux based on these results.  His other pain medications include his gabapentin and hydrocodone 5/325 prn.    Today, he is accompanied by his father.  He reports being seen by speech and swallowing with suggestions to increase his fluid intake post prandially to ensure adequate esophageal mobility.  Otherwise, he continues to have a PEG with osmolite 1.5 at 60 ml/hr for 12 hours nocturnal continuosly feeding daily. He complains of trouble sleeping due to positional pain related to his left chest wall; trouble swallowing for the past few weeks.  He also endorses occasional hoarseness. He tolerated his XRT with moderate fatigue. The patient denied fever, chills, night sweats, change in appetite or weight. He denied headaches, double vision, blurry vision, nasal congestion, nasal  discharge, odynophagia or dysphagia. No palpitations, dyspnea, cough, abdominal pain,  vomiting, diarrhea, hematochezia. The patient denied dysuria, nocturia, polyuria, hematuria, myalgia, tingling.   Medications: I have reviewed the patient's current medications.  Current  Outpatient Prescriptions  Medication Sig Dispense Refill  . clindamycin (CLINDAGEL) 1 % gel Apply topically as needed. Apply to facial and chest skin rash.  30 g  3  . clobetasol cream (TEMOVATE) 0.05 % Apply topically as needed. Apply to rash BID      . fentaNYL (DURAGESIC - DOSED MCG/HR) 25 MCG/HR patch Place 1 patch (25 mcg total) onto the skin every 3 (three) days.  10 patch  0  . gabapentin (NEURONTIN) 300 MG capsule Take 1 capsule (300 mg total) by mouth 3 (three) times daily.  90 capsule  3  . HYDROcodone-acetaminophen (NORCO/VICODIN) 5-325 MG per tablet Take 1 tablet by mouth every 6 (six) hours as needed for pain.  90 tablet  0  . hydrocortisone cream 0.5 % Apply topically 2 (two) times daily as needed. Apply to axilla rash twice daily as needed.      . lidocaine-prilocaine (EMLA) cream Apply topically as needed. Apply to [facility] site one hour prior to needle stick to numb skin.  30 g  3  . LORazepam (ATIVAN) 0.5 MG tablet DISSOLVE 1 TABLET UNDER THE TONGUE EVERY 6 HOURS AS NEEDED FOR ANXIETY OR FOR NAUSEA AND VOMITING  60 tablet  0  . Nutritional Supplements (FEEDING SUPPLEMENT, OSMOLITE 1.5 CAL,) LIQD Begin Osmolite 1.5 at 60 ml/hr for 12 hours nocturnal continuous feeding daily. Flush with 60 ml free water before and after continuous feedings. Continue 1 can of Osmolite 1.5 via gravity feeding TID with 60 ml free water before and after each bolus feeding. Please send pump to patient.  1422 mL  0  . omeprazole (PRILOSEC) 10 MG capsule Take 10 mg by mouth daily.      . ondansetron (ZOFRAN) 8 MG tablet Take 8 mg by mouth every 12 (twelve) hours as needed.      . prochlorperazine (COMPAZINE) 10 MG tablet Take 1 tablet (10 mg total) by mouth every 6 (six) hours as needed.  60 tablet  1  . sertraline (ZOLOFT) 50 MG tablet Take 1.5 tablets (75 mg total) by mouth daily.  60 tablet  0  . sodium fluoride (PREVIDENT 5000 PLUS) 1.1 % CREA dental cream Apply thin ribbon of cream to tooth brush.  Brush teeth for 2 minutes. Spit out excess-DO NOT swallow. DO NOT rinse afterwards. Repeat nightly.  1 Tube  prn   No current facility-administered medications for this visit.   Allergies:  Allergies  Allergen Reactions  . Codeine Phosphate     REACTION: rash, swelling  . Morphine And Related   . Tape     Blisters; please use paper tape    Past Medical History, Surgical history, Social history, and Family History were reviewed and updated.  Review of Systems: Constitutional:  Negative for fever, chills, night sweats, anorexia, weight loss, 2 out of 10 pain to left rib cage, neck  Cardiovascular: no chest pain or dyspnea on exertion Respiratory: no cough, shortness of breath, or wheezing Neurological: no TIA or stroke symptoms Dermatological: positive for rash negative for lumps ENT: negative for - oral lesions Skin: Negative. Gastrointestinal: no abdominal pain, change in bowel habits, or black or bloody stools; positive for trouble swallowing but improves with post-prandial hydration.  positive for - G tube Genito-Urinary: no dysuria, trouble voiding, or  hematuria Hematological and Lymphatic: negative for - bleeding problems Musculoskeletal: negative for - gait disturbance Remaining ROS negative.  Physical Exam: BP 137/75  Pulse 93  Temp(Src) 97.9 F (36.6 C) (Oral)  Resp 20  Ht 6' (1.829 m)  Wt 174 lb 4.8 oz (79.062 kg)  BMI 23.63 kg/m2 ECOG: 0 General appearance: alert, cooperative, appears stated age and no distress Head: Normocephalic, without obvious abnormality, atraumatic HEENT: Sclerae anicteric. Conjuncitvae pink. PERRLA; Oral mucosa moist without ulceration or thrush.  Neck: + cervical adenopathy about 1 cm rubbery and non-tender, supple, symmetrical, trachea midline and thyroid not enlarged, symmetric, no tenderness/mass/nodules. No subandibular adenopathy. S/p major neck surgery with neck scars well healed.  Lymph nodes: Cervical adenopathy: as noted above.   Supraclavicular adenopathy: None appreciated; No  Heart:regular rate and rhythm, S1, S2 normal, no murmur, click, rub or gallop Lung:chest clear, no wheezing, rales, normal symmetric air entry, Heart exam - S1, S2 normal, no murmur, no gallop, rate regular Abdomin: soft, non-tender, without masses or organomegaly + G tube without signs of infections.  EXT: No peripheral edema; positive tenderness over left scapular, left chest wall.  Skin: Rash on face, back, chest present since restarting Erbitux.  + Left Chest port-a-cath without tenderness or fluctuance   Lab Results: CBC    Component Value Date/Time   WBC 4.8 09/23/2013 1047   WBC 1.8* 08/20/2012 0846   RBC 3.13* 09/23/2013 1047   RBC 2.97* 08/20/2012 0846   HGB 9.1* 09/23/2013 1047   HGB 10.1* 08/20/2012 0846   HCT 29.5* 09/23/2013 1047   HCT 30.0* 08/20/2012 0846   PLT 234 09/23/2013 1047   PLT 99* 08/20/2012 0846   MCV 94.2 09/23/2013 1047   MCV 101.0* 08/20/2012 0846   MCH 29.1 09/23/2013 1047   MCH 34.0 08/20/2012 0846   MCHC 30.8* 09/23/2013 1047   MCHC 33.7 08/20/2012 0846   RDW 17.5* 09/23/2013 1047   RDW 15.9* 08/20/2012 0846   LYMPHSABS 0.6* 09/23/2013 1047   LYMPHSABS 0.4* 08/20/2012 0846   MONOABS 0.4 09/23/2013 1047   MONOABS 0.1 08/20/2012 0846   EOSABS 0.1 09/23/2013 1047   EOSABS 0.1 08/20/2012 0846   BASOSABS 0.0 09/23/2013 1047   BASOSABS 0.0 08/20/2012 0846    CMP     Component Value Date/Time   NA 140 09/23/2013 1047   NA 138 06/25/2012 0858   K 3.7 09/23/2013 1047   K 3.1* 06/25/2012 0858   CL 102 02/25/2013 0753   CL 99 06/25/2012 0858   CO2 24 09/23/2013 1047   CO2 30 06/25/2012 0858   GLUCOSE 114 09/23/2013 1047   GLUCOSE 86 02/25/2013 0753   GLUCOSE 88 06/25/2012 0858   BUN 8.4 09/23/2013 1047   BUN 10 06/25/2012 0858   CREATININE 0.7 09/23/2013 1047   CREATININE 0.47* 06/25/2012 0858   CALCIUM 9.4 09/23/2013 1047   CALCIUM 9.6 06/25/2012 0858   PROT 7.3 09/23/2013 1047   PROT 6.8 05/03/2012 0905   ALBUMIN 3.3*  09/23/2013 1047   ALBUMIN 3.8 05/03/2012 0905   AST 21 09/23/2013 1047   AST 14 05/03/2012 0905   ALT 13 09/23/2013 1047   ALT 11 05/03/2012 0905   ALKPHOS 64 09/23/2013 1047   ALKPHOS 54 05/03/2012 0905   BILITOT 0.25 09/23/2013 1047   BILITOT 0.3 05/03/2012 0905   GFRNONAA >90 12/29/2011 0350   GFRAA >90 12/29/2011 0350   Radiological Studies: CT CHEST, ABDOMEN AND PELVIS WITH CONTRAST (05/16/2013) Technique: Multidetector CT imaging of the chest, abdomen  and pelvis was performed following the standard protocol during bolus administration of intravenous contrast.  Contrast: 151m OMNIPAQUE IOHEXOL 300 MG/ML SOLN  Comparison: 03/01/2013 CT CHEST  Findings: 4.3 x 3.2 cm cavitary lesion in the superior segment left lower lobe (series 5/image 24), previously 3.5 x 2.6 cm.  Increasing fluid within the lesion. Moderate paraseptal emphysematous changes in the upper lobes. No new/suspicious pulmonary nodules. No pleural effusion or pneumothorax.  Postprocedural changes in the lower left neck. The visualized thyroid is unremarkable. The heart is top normal in size. No pericardial effusion. Left chest port. No suspicious mediastinal, hilar, or axillary lymphadenopathy. Surgical clips in the left axilla. Mild degenerative changes of the thoracic spine. IMPRESSION: 4.3 x 3.2 cm cavitary lesion/metastasis in the superior segment left lower lobe, mildly increased. Moderate paraseptal emphysematous changes. CT ABDOMEN AND PELVIS Findings: Gastrostomy tube in satisfactory position. Liver, pancreas, and adrenal glands are within normal limits. 5.9 x 4.1 cm rim calcified, multiloculated lesion in the medial spleen (series 2/image 63), unchanged. Suspected cholelithiasis (series 2/image 75), without associated inflammatory changes.  1.5 x 1.2 cm medial right upper pole renal cyst (series 2/image 71). Left kidney is within normal limits. No hydronephrosis. No evidence of bowel obstruction. Normal appendix. Atherosclerotic  calcifications of the abdominal aorta and branch vessels. No abdominopelvic ascites. No suspicious abdominopelvic lymphadenopathy. Prostate is at the upper limits of normal for size. Bladder is within normal limits.  Degenerative changes of the lumbar spine. Known metastasis in the left iliac bone is not evident on CT.  IMPRESSION: Known metastasis in the left iliac bone is not evident on CT. No evidence of new/progressive metastatic disease in the abdomen/pelvis. Additional stable ancillary findings as above.  MRI CHEST WITHOUT AND WITH CONTRAST TECHNIQUE: Multiplanar multisequence MR imaging of the chest was performed both before and after administration of intravenous contrast. CONTRAST: 120mMULTIHANCE GADOBENATE DIMEGLUMINE 529 MG/ML IV SOLN COMPARISON: 05/16/2013 IMPRESSION:1. Interval enlargement and chest wall invasion associated with the 5.6 cm superior segment left lower lobe mass, with tumor extending into the left 5th rib and the adjacent 4th and 5th intercostal spaces. Abnormal enhancement compatible with tumor extension tracking anteriorly in the 5th intercostal space between the 5th and 6th ribs. Abnormal soft tissue encases the left 5th rib, and there is early involvement of the left 4th rib as well. 2. Rim calcified multi-septated cystic mass in the spleen. Similar appearance to prior exams - no hypermetabolism on prior PET-CT. 3. Cholelithiasis.  CT C/A/P 08/22/2013 IMPRESSION: 1. Interval expansion of pleural-based lesion in the left lower lobe with new this adjacent parenchymal nodules is most consistent with progression of metastatic carcinoma. 2. New right infrahilar low-density lymph node is concerning for new nodal metastasis.  3. Skeletal metastasis in the left iliac bone seen on PET-CT scan is again not identified by CT.  Impression and Plan: ASSESSMENT AND PLAN: 1. Recurrent left lateral tongue squamous cell carcinoma: He now has metastatic disease to the lung and bone and left  chest wall. --[facility]ille Blancowith  plans for clinical trial enrollment.  They reviewed his  [id number] sequencing without identifiable actionablelesions.  We discussed his treatment options.  Given his other disease sites remained well controlled, he agreed to maintain the same regimen (carbo/cetuximab/docetaxel). 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  I also provided Mr. BoBlankandouts detailing meta-analysis data that cetuximab-induced rash was associated with a significantly  improved OS, PFS and ORR.  (Abdel-Rahman et. Mindi Junker, 2014 Aug 12). Alternatively, if there is progression to the other sites, we will consider transitioning to a new therapy.  This would include  methotrexate or other agent such as gemcitabine or perhaps pemetrexed.  Patient voiced understanding the treatment options and agreed to proceed with carbo/cetuximab/docetaxel.  Handouts and detailed risk and benefits and indications have been provided on prior visits.  We plan to restage in 3 months.    We will continue chemotherapy on 09/23/2012.  Next cycle on 1/19 then one week off (01/26).  He will follow up on 10/14/2012 for his next office visit and chemotherapy.    Oncology Flowsheet 09/23/2012  Day, Cycle Day 1, Cycle 39  CARBOplatin (PARAPLATIN) IV 180 mg  CARBOplatin test dose (PARAPLATIN) ID 100 mcg  cetuximab (ERBITUX) IV 150 mg/m2  dexamethasone (DECADRON) IV 20 mg  ondansetron (ZOFRAN) IV 16 mg  PACLitaxel (TAXOL) IV 48 mg/m2  zolendronic acid (ZOMETA) IV 4 mg    2. Skin rash secondary to Erbitux.  He is back on erbitux. Cetuximab-induced skin rash may represent a prognostic factor in patients with advanced solid tumors. For Grade 0, we will continue prophyloactic therapy with sunscreen SPF greater than or equal to 15; Grade 1, Hydrocortisone 2.5% cream and clindamycin 1 % gel with  bi-weekly assessments; Grade 2, antibiotics (keflex) and hydrocortinonse 2.5%, desonide, alcometasone 0.05%; Grade 3 or intolerable grade 3, hydrocortisone 2.5%, desonide, alcometasone 0.05%and antibiotics and prednisone 0.5 mg/kg for sever days.  If no improvement for Grade 3 in 2 weeks, dose interruption will be required.  3. 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  4. Bone met: limited. S/p palliative XRT. He has been on Zometa every 6 weeks. Zometa given on 08/27/2013 and will be given with next chemotherapy.  5. Anemia: We will follow him symptomatically for  Transfusion.   6. Smoking: none at this time.  7. Dysphagia: Bariam swallow then referral to speech and swallow for evaluation.  8. Left chest wall pain, insomnia.   Lidocaine patch, fentanyl and hydrocodone prn.   Referral to pain clinic based on increasing use of narcotics.  9. Follow up: in 4 weeks on 10/21/2013 with labs and chemotherapy and zometa.    All questions were answered. The patient knows to call the clinic with any problems, questions or concerns. We can certainly see the patient much sooner if necessary.  Patient provided prescriptions for fentanyl patch.   Patient also was provided an after visit clinical summary.    I spent 10 minutes or more than half the time coordinating care during 15 minute follow-up visit.    Keldon Lassen, MD 09/24/2013   1:45 PM

## 2013-09-25 ENCOUNTER — Telehealth: Payer: Self-pay | Admitting: Internal Medicine

## 2013-09-25 NOTE — Telephone Encounter (Signed)
s.w. pt and advised on Jan adn Feb appts...pt ok and aware...sed added tx.

## 2013-09-26 ENCOUNTER — Other Ambulatory Visit: Payer: Self-pay | Admitting: Internal Medicine

## 2013-09-26 ENCOUNTER — Encounter: Payer: Self-pay | Admitting: Internal Medicine

## 2013-09-30 ENCOUNTER — Other Ambulatory Visit (HOSPITAL_BASED_OUTPATIENT_CLINIC_OR_DEPARTMENT_OTHER): Payer: Medicaid Other

## 2013-09-30 ENCOUNTER — Ambulatory Visit (HOSPITAL_BASED_OUTPATIENT_CLINIC_OR_DEPARTMENT_OTHER): Payer: Medicaid Other

## 2013-09-30 ENCOUNTER — Other Ambulatory Visit: Payer: Self-pay | Admitting: Internal Medicine

## 2013-09-30 VITALS — BP 117/75 | HR 97 | Temp 98.6°F | Resp 19

## 2013-09-30 DIAGNOSIS — C029 Malignant neoplasm of tongue, unspecified: Secondary | ICD-10-CM

## 2013-09-30 DIAGNOSIS — Z5111 Encounter for antineoplastic chemotherapy: Secondary | ICD-10-CM

## 2013-09-30 DIAGNOSIS — C7952 Secondary malignant neoplasm of bone marrow: Secondary | ICD-10-CM

## 2013-09-30 DIAGNOSIS — C7800 Secondary malignant neoplasm of unspecified lung: Secondary | ICD-10-CM

## 2013-09-30 DIAGNOSIS — C7951 Secondary malignant neoplasm of bone: Secondary | ICD-10-CM

## 2013-09-30 LAB — BASIC METABOLIC PANEL (CC13)
ANION GAP: 9 meq/L (ref 3–11)
BUN: 7.9 mg/dL (ref 7.0–26.0)
CO2: 28 mEq/L (ref 22–29)
Calcium: 9.6 mg/dL (ref 8.4–10.4)
Chloride: 102 mEq/L (ref 98–109)
Creatinine: 0.7 mg/dL (ref 0.7–1.3)
Glucose: 94 mg/dl (ref 70–140)
POTASSIUM: 3.8 meq/L (ref 3.5–5.1)
SODIUM: 140 meq/L (ref 136–145)

## 2013-09-30 LAB — CBC WITH DIFFERENTIAL/PLATELET
BASO%: 1.2 % (ref 0.0–2.0)
Basophils Absolute: 0.1 10*3/uL (ref 0.0–0.1)
EOS%: 5.3 % (ref 0.0–7.0)
Eosinophils Absolute: 0.3 10*3/uL (ref 0.0–0.5)
HCT: 29.6 % — ABNORMAL LOW (ref 38.4–49.9)
HGB: 9.8 g/dL — ABNORMAL LOW (ref 13.0–17.1)
LYMPH%: 14.5 % (ref 14.0–49.0)
MCH: 30.8 pg (ref 27.2–33.4)
MCHC: 33.2 g/dL (ref 32.0–36.0)
MCV: 92.7 fL (ref 79.3–98.0)
MONO#: 0.4 10*3/uL (ref 0.1–0.9)
MONO%: 7.6 % (ref 0.0–14.0)
NEUT#: 3.5 10*3/uL (ref 1.5–6.5)
NEUT%: 71.4 % (ref 39.0–75.0)
Platelets: 295 10*3/uL (ref 140–400)
RBC: 3.2 10*6/uL — AB (ref 4.20–5.82)
RDW: 19.2 % — ABNORMAL HIGH (ref 11.0–14.6)
WBC: 4.9 10*3/uL (ref 4.0–10.3)
lymph#: 0.7 10*3/uL — ABNORMAL LOW (ref 0.9–3.3)

## 2013-09-30 LAB — MAGNESIUM (CC13): MAGNESIUM: 2.2 mg/dL (ref 1.5–2.5)

## 2013-09-30 MED ORDER — DIPHENHYDRAMINE HCL 50 MG/ML IJ SOLN
50.0000 mg | Freq: Once | INTRAMUSCULAR | Status: AC
  Administered 2013-09-30: 50 mg via INTRAVENOUS

## 2013-09-30 MED ORDER — SODIUM CHLORIDE 0.9 % IV SOLN
Freq: Once | INTRAVENOUS | Status: AC
  Administered 2013-09-30: 12:00:00 via INTRAVENOUS

## 2013-09-30 MED ORDER — CETUXIMAB CHEMO IV INJECTION 200 MG/100ML
150.0000 mg/m2 | Freq: Once | INTRAVENOUS | Status: AC
  Administered 2013-09-30: 300 mg via INTRAVENOUS
  Filled 2013-09-30: qty 150

## 2013-09-30 MED ORDER — DIPHENHYDRAMINE HCL 50 MG/ML IJ SOLN
INTRAMUSCULAR | Status: AC
  Filled 2013-09-30: qty 1

## 2013-09-30 MED ORDER — ONDANSETRON 16 MG/50ML IVPB (CHCC)
INTRAVENOUS | Status: AC
  Filled 2013-09-30: qty 16

## 2013-09-30 MED ORDER — DEXAMETHASONE SODIUM PHOSPHATE 20 MG/5ML IJ SOLN
20.0000 mg | Freq: Once | INTRAMUSCULAR | Status: AC
  Administered 2013-09-30: 20 mg via INTRAVENOUS

## 2013-09-30 MED ORDER — FAMOTIDINE IN NACL 20-0.9 MG/50ML-% IV SOLN
INTRAVENOUS | Status: AC
  Filled 2013-09-30: qty 50

## 2013-09-30 MED ORDER — DEXAMETHASONE SODIUM PHOSPHATE 20 MG/5ML IJ SOLN
INTRAMUSCULAR | Status: AC
  Filled 2013-09-30: qty 5

## 2013-09-30 MED ORDER — SODIUM CHLORIDE 0.9 % IJ SOLN
10.0000 mL | INTRAMUSCULAR | Status: DC | PRN
  Filled 2013-09-30: qty 10

## 2013-09-30 MED ORDER — SODIUM CHLORIDE 0.9 % IV SOLN
180.0000 mg | Freq: Once | INTRAVENOUS | Status: AC
  Administered 2013-09-30: 180 mg via INTRAVENOUS
  Filled 2013-09-30: qty 18

## 2013-09-30 MED ORDER — ONDANSETRON 16 MG/50ML IVPB (CHCC)
16.0000 mg | Freq: Once | INTRAVENOUS | Status: AC
  Administered 2013-09-30: 16 mg via INTRAVENOUS

## 2013-09-30 MED ORDER — SODIUM CHLORIDE 0.9 % IV SOLN
48.0000 mg/m2 | Freq: Once | INTRAVENOUS | Status: AC
  Administered 2013-09-30: 90 mg via INTRAVENOUS
  Filled 2013-09-30: qty 15

## 2013-09-30 MED ORDER — HEPARIN SOD (PORK) LOCK FLUSH 100 UNIT/ML IV SOLN
500.0000 [IU] | Freq: Once | INTRAVENOUS | Status: DC | PRN
  Filled 2013-09-30: qty 5

## 2013-09-30 MED ORDER — FAMOTIDINE IN NACL 20-0.9 MG/50ML-% IV SOLN
20.0000 mg | Freq: Once | INTRAVENOUS | Status: AC
  Administered 2013-09-30: 20 mg via INTRAVENOUS

## 2013-09-30 MED ORDER — CARBOPLATIN CHEMO INTRADERMAL TEST DOSE 100MCG/0.02ML
100.0000 ug | Freq: Once | INTRADERMAL | Status: AC
  Administered 2013-09-30: 100 ug via INTRADERMAL
  Filled 2013-09-30: qty 0.01

## 2013-09-30 NOTE — Patient Instructions (Signed)
Thomas Bean Discharge Instructions for Patients Receiving Chemotherapy  Today you received the following chemotherapy agents erbitux, taxol, carboplatin  To help prevent nausea and vomiting after your treatment, we encourage you to take your nausea medication start taking about 6 pm if needed   If you develop nausea and vomiting that is not controlled by your nausea medication, call the clinic.   BELOW ARE SYMPTOMS THAT SHOULD BE REPORTED IMMEDIATELY:  *FEVER GREATER THAN 100.5 F  *CHILLS WITH OR WITHOUT FEVER  NAUSEA AND VOMITING THAT IS NOT CONTROLLED WITH YOUR NAUSEA MEDICATION  *UNUSUAL SHORTNESS OF BREATH  *UNUSUAL BRUISING OR BLEEDING  TENDERNESS IN MOUTH AND THROAT WITH OR WITHOUT PRESENCE OF ULCERS  *URINARY PROBLEMS  *BOWEL PROBLEMS  UNUSUAL RASH Items with * indicate a potential emergency and should be followed up as soon as possible.  Feel free to call the clinic you have any questions or concerns. The clinic phone number is [phone].

## 2013-10-01 ENCOUNTER — Other Ambulatory Visit: Payer: Self-pay | Admitting: Medical Oncology

## 2013-10-01 ENCOUNTER — Encounter: Payer: Self-pay | Admitting: Internal Medicine

## 2013-10-01 ENCOUNTER — Other Ambulatory Visit: Payer: Self-pay | Admitting: Internal Medicine

## 2013-10-01 DIAGNOSIS — C029 Malignant neoplasm of tongue, unspecified: Secondary | ICD-10-CM

## 2013-10-01 MED ORDER — LORAZEPAM 0.5 MG PO TABS: 0.5000 mg | ORAL_TABLET | Freq: Four times a day (QID) | ORAL | Status: DC | PRN

## 2013-10-14 ENCOUNTER — Telehealth: Payer: Self-pay | Admitting: *Deleted

## 2013-10-14 ENCOUNTER — Other Ambulatory Visit (HOSPITAL_BASED_OUTPATIENT_CLINIC_OR_DEPARTMENT_OTHER): Payer: Medicaid Other

## 2013-10-14 ENCOUNTER — Telehealth: Payer: Self-pay | Admitting: Internal Medicine

## 2013-10-14 ENCOUNTER — Ambulatory Visit (HOSPITAL_BASED_OUTPATIENT_CLINIC_OR_DEPARTMENT_OTHER): Payer: Medicaid Other

## 2013-10-14 ENCOUNTER — Ambulatory Visit (HOSPITAL_BASED_OUTPATIENT_CLINIC_OR_DEPARTMENT_OTHER): Payer: Medicaid Other | Admitting: Internal Medicine

## 2013-10-14 ENCOUNTER — Other Ambulatory Visit: Payer: Self-pay | Admitting: Medical Oncology

## 2013-10-14 VITALS — BP 127/81 | HR 97 | Temp 97.8°F | Resp 18 | Ht 72.0 in | Wt 178.1 lb

## 2013-10-14 DIAGNOSIS — C7951 Secondary malignant neoplasm of bone: Secondary | ICD-10-CM

## 2013-10-14 DIAGNOSIS — D649 Anemia, unspecified: Secondary | ICD-10-CM

## 2013-10-14 DIAGNOSIS — Z5111 Encounter for antineoplastic chemotherapy: Secondary | ICD-10-CM

## 2013-10-14 DIAGNOSIS — C029 Malignant neoplasm of tongue, unspecified: Secondary | ICD-10-CM

## 2013-10-14 DIAGNOSIS — C7952 Secondary malignant neoplasm of bone marrow: Secondary | ICD-10-CM

## 2013-10-14 DIAGNOSIS — F419 Anxiety disorder, unspecified: Secondary | ICD-10-CM

## 2013-10-14 DIAGNOSIS — C779 Secondary and unspecified malignant neoplasm of lymph node, unspecified: Secondary | ICD-10-CM

## 2013-10-14 DIAGNOSIS — C7800 Secondary malignant neoplasm of unspecified lung: Secondary | ICD-10-CM

## 2013-10-14 DIAGNOSIS — C50919 Malignant neoplasm of unspecified site of unspecified female breast: Secondary | ICD-10-CM

## 2013-10-14 DIAGNOSIS — C770 Secondary and unspecified malignant neoplasm of lymph nodes of head, face and neck: Secondary | ICD-10-CM

## 2013-10-14 DIAGNOSIS — R1310 Dysphagia, unspecified: Secondary | ICD-10-CM

## 2013-10-14 DIAGNOSIS — G4700 Insomnia, unspecified: Secondary | ICD-10-CM

## 2013-10-14 DIAGNOSIS — R071 Chest pain on breathing: Secondary | ICD-10-CM

## 2013-10-14 DIAGNOSIS — Z5112 Encounter for antineoplastic immunotherapy: Secondary | ICD-10-CM

## 2013-10-14 LAB — COMPREHENSIVE METABOLIC PANEL (CC13)
ALBUMIN: 3.4 g/dL — AB (ref 3.5–5.0)
ALT: 12 U/L (ref 0–55)
AST: 16 U/L (ref 5–34)
Alkaline Phosphatase: 62 U/L (ref 40–150)
Anion Gap: 10 mEq/L (ref 3–11)
BUN: 7.3 mg/dL (ref 7.0–26.0)
CALCIUM: 9.6 mg/dL (ref 8.4–10.4)
CHLORIDE: 104 meq/L (ref 98–109)
CO2: 27 meq/L (ref 22–29)
Creatinine: 0.7 mg/dL (ref 0.7–1.3)
Glucose: 117 mg/dl (ref 70–140)
POTASSIUM: 3.7 meq/L (ref 3.5–5.1)
Sodium: 141 mEq/L (ref 136–145)
Total Bilirubin: 0.2 mg/dL (ref 0.20–1.20)
Total Protein: 7.3 g/dL (ref 6.4–8.3)

## 2013-10-14 LAB — LACTATE DEHYDROGENASE (CC13): LDH: 183 U/L (ref 125–245)

## 2013-10-14 LAB — CBC WITH DIFFERENTIAL/PLATELET
BASO%: 0.7 % (ref 0.0–2.0)
BASOS ABS: 0 10*3/uL (ref 0.0–0.1)
EOS%: 3.1 % (ref 0.0–7.0)
Eosinophils Absolute: 0.2 10*3/uL (ref 0.0–0.5)
HCT: 30.2 % — ABNORMAL LOW (ref 38.4–49.9)
HEMOGLOBIN: 9.6 g/dL — AB (ref 13.0–17.1)
LYMPH%: 12.5 % — ABNORMAL LOW (ref 14.0–49.0)
MCH: 29.7 pg (ref 27.2–33.4)
MCHC: 31.8 g/dL — AB (ref 32.0–36.0)
MCV: 93.5 fL (ref 79.3–98.0)
MONO#: 0.5 10*3/uL (ref 0.1–0.9)
MONO%: 8.2 % (ref 0.0–14.0)
NEUT#: 4.4 10*3/uL (ref 1.5–6.5)
NEUT%: 75.5 % — ABNORMAL HIGH (ref 39.0–75.0)
Platelets: 216 10*3/uL (ref 140–400)
RBC: 3.23 10*6/uL — AB (ref 4.20–5.82)
RDW: 17.7 % — AB (ref 11.0–14.6)
WBC: 5.8 10*3/uL (ref 4.0–10.3)
lymph#: 0.7 10*3/uL — ABNORMAL LOW (ref 0.9–3.3)

## 2013-10-14 MED ORDER — SODIUM CHLORIDE 0.9 % IJ SOLN
10.0000 mL | INTRAMUSCULAR | Status: DC | PRN
  Administered 2013-10-14: 10 mL
  Filled 2013-10-14: qty 10

## 2013-10-14 MED ORDER — SODIUM CHLORIDE 0.9 % IV SOLN
180.0000 mg | Freq: Once | INTRAVENOUS | Status: AC
  Administered 2013-10-14: 180 mg via INTRAVENOUS
  Filled 2013-10-14: qty 18

## 2013-10-14 MED ORDER — FAMOTIDINE IN NACL 20-0.9 MG/50ML-% IV SOLN
INTRAVENOUS | Status: AC
  Filled 2013-10-14: qty 50

## 2013-10-14 MED ORDER — FAMOTIDINE IN NACL 20-0.9 MG/50ML-% IV SOLN
20.0000 mg | Freq: Once | INTRAVENOUS | Status: AC
  Administered 2013-10-14: 20 mg via INTRAVENOUS

## 2013-10-14 MED ORDER — DIPHENHYDRAMINE HCL 50 MG/ML IJ SOLN
INTRAMUSCULAR | Status: AC
  Filled 2013-10-14: qty 1

## 2013-10-14 MED ORDER — ONDANSETRON 16 MG/50ML IVPB (CHCC)
16.0000 mg | Freq: Once | INTRAVENOUS | Status: AC
  Administered 2013-10-14: 16 mg via INTRAVENOUS

## 2013-10-14 MED ORDER — CARBOPLATIN CHEMO INTRADERMAL TEST DOSE 100MCG/0.02ML
100.0000 ug | Freq: Once | INTRADERMAL | Status: AC
  Administered 2013-10-14: 100 ug via INTRADERMAL
  Filled 2013-10-14: qty 0.01

## 2013-10-14 MED ORDER — DIPHENHYDRAMINE HCL 50 MG/ML IJ SOLN
50.0000 mg | Freq: Once | INTRAMUSCULAR | Status: AC
  Administered 2013-10-14: 50 mg via INTRAVENOUS

## 2013-10-14 MED ORDER — SODIUM CHLORIDE 0.9 % IV SOLN
48.0000 mg/m2 | Freq: Once | INTRAVENOUS | Status: AC
  Administered 2013-10-14: 90 mg via INTRAVENOUS
  Filled 2013-10-14: qty 15

## 2013-10-14 MED ORDER — DEXAMETHASONE SODIUM PHOSPHATE 20 MG/5ML IJ SOLN
INTRAMUSCULAR | Status: AC
  Filled 2013-10-14: qty 5

## 2013-10-14 MED ORDER — FENTANYL 50 MCG/HR TD PT72: 50.0000 ug | MEDICATED_PATCH | TRANSDERMAL | Status: DC

## 2013-10-14 MED ORDER — DEXAMETHASONE SODIUM PHOSPHATE 20 MG/5ML IJ SOLN
20.0000 mg | Freq: Once | INTRAMUSCULAR | Status: AC
Start: 2013-10-14 — End: 2013-10-14
  Administered 2013-10-14: 20 mg via INTRAVENOUS

## 2013-10-14 MED ORDER — ONDANSETRON 16 MG/50ML IVPB (CHCC)
INTRAVENOUS | Status: AC
  Filled 2013-10-14: qty 16

## 2013-10-14 MED ORDER — HEPARIN SOD (PORK) LOCK FLUSH 100 UNIT/ML IV SOLN
500.0000 [IU] | Freq: Once | INTRAVENOUS | Status: AC | PRN
  Administered 2013-10-14: 500 [IU]
  Filled 2013-10-14: qty 5

## 2013-10-14 MED ORDER — ZOLEDRONIC ACID 4 MG/100ML IV SOLN
4.0000 mg | Freq: Once | INTRAVENOUS | Status: AC
  Administered 2013-10-14: 4 mg via INTRAVENOUS
  Filled 2013-10-14: qty 100

## 2013-10-14 MED ORDER — LORAZEPAM 0.5 MG PO TABS: 0.5000 mg | ORAL_TABLET | Freq: Three times a day (TID) | ORAL | Status: DC | PRN

## 2013-10-14 MED ORDER — SODIUM CHLORIDE 0.9 % IV SOLN: Freq: Once | INTRAVENOUS | Status: DC

## 2013-10-14 MED ORDER — CETUXIMAB CHEMO IV INJECTION 200 MG/100ML
150.0000 mg/m2 | Freq: Once | INTRAVENOUS | Status: AC
  Administered 2013-10-14: 300 mg via INTRAVENOUS
  Filled 2013-10-14: qty 150

## 2013-10-14 MED ORDER — SODIUM CHLORIDE 0.9 % IV SOLN
Freq: Once | INTRAVENOUS | Status: AC
  Administered 2013-10-14: 12:00:00 via INTRAVENOUS

## 2013-10-14 NOTE — Telephone Encounter (Signed)
Talked to pt and gave him appt for lab,md and chemo for February 2015 °

## 2013-10-14 NOTE — Patient Instructions (Signed)
[place] Discharge Instructions for Patients Receiving Chemotherapy  Today you received the following chemotherapy agents: zometa, erbitux, taxol, carboplatin  To help prevent nausea and vomiting after your treatment, we encourage you to take your nausea medication.  Take it as often as prescribed.     If you develop nausea and vomiting that is not controlled by your nausea medication, call the clinic. If it is after clinic hours your family physician or the after hours number for the clinic or go to the Emergency Department.   BELOW ARE SYMPTOMS THAT SHOULD BE REPORTED IMMEDIATELY:  *FEVER GREATER THAN 100.5 F  *CHILLS WITH OR WITHOUT FEVER  NAUSEA AND VOMITING THAT IS NOT CONTROLLED WITH YOUR NAUSEA MEDICATION  *UNUSUAL SHORTNESS OF BREATH  *UNUSUAL BRUISING OR BLEEDING  TENDERNESS IN MOUTH AND THROAT WITH OR WITHOUT PRESENCE OF ULCERS  *URINARY PROBLEMS  *BOWEL PROBLEMS  UNUSUAL RASH Items with * indicate a potential emergency and should be followed up as soon as possible.  Feel free to call the clinic you have any questions or concerns. The clinic phone number is [phone].   I have been informed and understand all the instructions given to me. I know to contact the clinic, my physician, or go to the Emergency Department if any problems should occur. I do not have any questions at this time, but understand that I may call the clinic during office hours   should I have any questions or need assistance in obtaining follow up care.    __________________________________________  _____________  __________ Signature of Patient or Authorized Representative            Date                   Time    __________________________________________ Nurse's Signature

## 2013-10-14 NOTE — Telephone Encounter (Signed)
Gave pt appt for lab and MD , emailed michelle regarding chemo

## 2013-10-14 NOTE — Progress Notes (Signed)
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 He developed biopsy proven metastatic disease in 03/2012 with met in right neck, left lung mass and left illac.   Prior Therapy:  1. 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 He developed recurrence in the left submandibular gland in August 2012. He underwent on 07/27/2011 radical left neck dissection by Dr. Ezzard Standing and Dr. Geryl Rankins. Pathology case number [id number] showed total 15 lymph nodes from left radical neck dissection that was all negative. However the left submandibular gland mass showed a 3.5 cm invasive SCC, moderately differentiated involving the adjacent skeletal muscle tissue and bone a tissue and extending into the inked margin. There was no evidence of any lymphatic invasion or perineural invasion. He underwent further resection by Dr. Mary Sella at [facility] on 09/29/2011.  2. He received adjuvant chemoradiation therapy with q 3 week Cisplatin given 11/15/11-12/05/11 (received 2 of 3 doses; 3rd dose held due to febrile neutropenia and development of flap abscess). He received concurrent XRT 11/15/11 through 01/05/12.  3. For right neck recurrence in 03/2012; he underwent palliative right neck dissection by Dr. Manson Passey at [facility].   Current therapy:  Started on 05/15/2012 palliative chemo weekly Carboplatin/Taxol/Erbitux; 3 weeks on; 1 week off. Due to prolonged cytopenia, his chemo had been changed to weekly; 2 weeks on, 1 week off.   He is on zometa q 6 weeks (last dose 09/02/2013). Palliative XRT to the left chest wall 30 gy at 3 Gy per fraction x 10 fractions started on 07/17/2013 and  completed on 07/30/2013.   Interim History:  CON ARGANBRIGHT 53 y.o. male returns for regular follow up visit. He was seen by me on 09/23/2012.  His pain medications have been decreased from fentanyl 75 mcg/hr to 50 mcg/hr.   In addition, he was seen by Dr. Elaina Pattee of [facility] 10/27 regarding review of his case for consideration of clinical trial enrollment.  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  With the exception of EGFR, no other actionable mutations were identified.   He was restarted on his carboplatin/taxol/erbitux based on these results.  His other pain medications include his gabapentin and hydrocodone 5/325 prn.    Today, he is accompanied by his father.  Otherwise, he continues to have a PEG with osmolite 1.5 at 60 ml/hr for 12 hours nocturnal continuosly feeding daily. He reports that the PEG gets stuck rarely and his tube is well-worn. The patient denied fever, chills, night sweats, change in appetite or weight. He denied headaches, double vision, blurry vision, nasal congestion, nasal discharge, odynophagia or dysphagia. No palpitations, dyspnea, cough, abdominal pain,  vomiting, diarrhea, hematochezia. The patient denied dysuria, nocturia, polyuria, hematuria, myalgia, tingling.   Medications: I have reviewed the patient's current medications.  Current Outpatient Prescriptions  Medication Sig Dispense Refill  . clindamycin (CLINDAGEL) 1 % gel Apply topically as needed. Apply to facial and chest skin rash.  30 g  3  . clobetasol cream (TEMOVATE) 0.05 % Apply topically as needed. Apply to rash BID      . fentaNYL (DURAGESIC - DOSED MCG/HR) 25 MCG/HR patch Place 1 patch (25 mcg total) onto the skin  every 3 (three) days.  10 patch  0  . gabapentin (NEURONTIN) 300 MG capsule Take 1 capsule (300 mg total) by mouth 3 (three) times daily.  90 capsule  3  . HYDROcodone-acetaminophen (NORCO/VICODIN) 5-325 MG per tablet Take 1 tablet by mouth every 6 (six) hours as  needed for pain.  90 tablet  0  . hydrocortisone cream 0.5 % Apply topically 2 (two) times daily as needed. Apply to axilla rash twice daily as needed.      . lidocaine-prilocaine (EMLA) cream Apply topically as needed. Apply to [facility] site one hour prior to needle stick to numb skin.  30 g  3  . LORazepam (ATIVAN) 0.5 MG tablet DISSOLVE 1 TABLET UNDER THE TONGUE EVERY 6 HOURS AS NEEDED FOR ANXIETY OR FOR NAUSEA AND VOMITING  60 tablet  0  . Nutritional Supplements (FEEDING SUPPLEMENT, OSMOLITE 1.5 CAL,) LIQD Begin Osmolite 1.5 at 60 ml/hr for 12 hours nocturnal continuous feeding daily. Flush with 60 ml free water before and after continuous feedings. Continue 1 can of Osmolite 1.5 via gravity feeding TID with 60 ml free water before and after each bolus feeding. Please send pump to patient.  1422 mL  0  . omeprazole (PRILOSEC) 10 MG capsule Take 10 mg by mouth daily.      . ondansetron (ZOFRAN) 8 MG tablet Take 8 mg by mouth every 12 (twelve) hours as needed.      . prochlorperazine (COMPAZINE) 10 MG tablet Take 1 tablet (10 mg total) by mouth every 6 (six) hours as needed.  60 tablet  1  . sertraline (ZOLOFT) 50 MG tablet Take 1.5 tablets (75 mg total) by mouth daily.  60 tablet  0  . sodium fluoride (PREVIDENT 5000 PLUS) 1.1 % CREA dental cream Apply thin ribbon of cream to tooth brush. Brush teeth for 2 minutes. Spit out excess-DO NOT swallow. DO NOT rinse afterwards. Repeat nightly.  1 Tube  prn   No current facility-administered medications for this visit.   Allergies:  Allergies  Allergen Reactions  . Codeine Phosphate     REACTION: rash, swelling  . Morphine And Related   . Tape     Blisters; please use paper tape    Past Medical History, Surgical history, Social history, and Family History were reviewed and updated.  Review of Systems: Constitutional:  Negative for fever, chills, night sweats, anorexia, weight loss, 2 out of 10 pain to left rib cage, neck  Cardiovascular: no  chest pain or dyspnea on exertion Respiratory: no cough, shortness of breath, or wheezing Neurological: no TIA or stroke symptoms Dermatological: positive for rash negative for lumps ENT: negative for - oral lesions Skin: Negative. Gastrointestinal: no abdominal pain, change in bowel habits, or black or bloody stools; positive for trouble swallowing but improves with post-prandial hydration.  positive for - G tube Genito-Urinary: no dysuria, trouble voiding, or hematuria Hematological and Lymphatic: negative for - bleeding problems Musculoskeletal: negative for - gait disturbance Remaining ROS negative.  Physical Exam: BP 127/81  Pulse 97  Temp(Src) 97.8 F (36.6 C) (Oral)  Resp 18  Ht 6' (1.829 m)  Wt 178 lb 1.6 oz (80.786 kg)  BMI 24.15 kg/m2  SpO2 99% ECOG: 0 General appearance: alert, cooperative, appears stated age and no distress Head: Normocephalic, without obvious abnormality, atraumatic HEENT: Sclerae anicteric. Conjuncitvae pink. PERRLA; Oral mucosa moist without ulceration or thrush.  Neck: + cervical adenopathy about 1 cm rubbery and non-tender, supple, symmetrical, trachea midline and  thyroid not enlarged, symmetric, no tenderness/mass/nodules. No submandibular adenopathy. S/p major neck surgery with neck scars well healed.  Lymph nodes: Cervical adenopathy: as noted above.  Supraclavicular adenopathy: None appreciated; No  Heart:regular rate and rhythm, S1, S2 normal, no murmur, click, rub or gallop Lung:chest clear, no wheezing, rales, normal symmetric air entry, Heart exam - S1, S2 normal, no murmur, no gallop, rate regular Abdomin: soft, non-tender, without masses or organomegaly + G tube without signs of infections.  EXT: No peripheral edema; positive tenderness over left scapular, left chest wall.  Skin: Rash on face, back, chest present since restarting Erbitux.  + Left Chest port-a-cath without tenderness or fluctuance   Lab Results: CBC    Component Value  Date/Time   WBC 5.8 10/14/2013 0958   WBC 1.8* 08/20/2012 0846   RBC 3.23* 10/14/2013 0958   RBC 2.97* 08/20/2012 0846   HGB 9.6* 10/14/2013 0958   HGB 10.1* 08/20/2012 0846   HCT 30.2* 10/14/2013 0958   HCT 30.0* 08/20/2012 0846   PLT 216 10/14/2013 0958   PLT 99* 08/20/2012 0846   MCV 93.5 10/14/2013 0958   MCV 101.0* 08/20/2012 0846   MCH 29.7 10/14/2013 0958   MCH 34.0 08/20/2012 0846   MCHC 31.8* 10/14/2013 0958   MCHC 33.7 08/20/2012 0846   RDW 17.7* 10/14/2013 0958   RDW 15.9* 08/20/2012 0846   LYMPHSABS 0.7* 10/14/2013 0958   LYMPHSABS 0.4* 08/20/2012 0846   MONOABS 0.5 10/14/2013 0958   MONOABS 0.1 08/20/2012 0846   EOSABS 0.2 10/14/2013 0958   EOSABS 0.1 08/20/2012 0846   BASOSABS 0.0 10/14/2013 0958   BASOSABS 0.0 08/20/2012 0846    CMP     Component Value Date/Time   NA 140 09/30/2013 1047   NA 138 06/25/2012 0858   K 3.8 09/30/2013 1047   K 3.1* 06/25/2012 0858   CL 102 02/25/2013 0753   CL 99 06/25/2012 0858   CO2 28 09/30/2013 1047   CO2 30 06/25/2012 0858   GLUCOSE 94 09/30/2013 1047   GLUCOSE 86 02/25/2013 0753   GLUCOSE 88 06/25/2012 0858   BUN 7.9 09/30/2013 1047   BUN 10 06/25/2012 0858   CREATININE 0.7 09/30/2013 1047   CREATININE 0.47* 06/25/2012 0858   CALCIUM 9.6 09/30/2013 1047   CALCIUM 9.6 06/25/2012 0858   PROT 7.3 09/23/2013 1047   PROT 6.8 05/03/2012 0905   ALBUMIN 3.3* 09/23/2013 1047   ALBUMIN 3.8 05/03/2012 0905   AST 21 09/23/2013 1047   AST 14 05/03/2012 0905   ALT 13 09/23/2013 1047   ALT 11 05/03/2012 0905   ALKPHOS 64 09/23/2013 1047   ALKPHOS 54 05/03/2012 0905   BILITOT 0.25 09/23/2013 1047   BILITOT 0.3 05/03/2012 0905   GFRNONAA >90 12/29/2011 0350   GFRAA >90 12/29/2011 0350   Radiological Studies: CT CHEST, ABDOMEN AND PELVIS WITH CONTRAST (05/16/2013) Technique: Multidetector CT imaging of the chest, abdomen and pelvis was performed following the standard protocol during bolus administration of intravenous contrast.  Contrast: 181mL OMNIPAQUE IOHEXOL 300 MG/ML SOLN   Comparison: 03/01/2013 CT CHEST  Findings: 4.3 x 3.2 cm cavitary lesion in the superior segment left lower lobe (series 5/image 24), previously 3.5 x 2.6 cm.  Increasing fluid within the lesion. Moderate paraseptal emphysematous changes in the upper lobes. No new/suspicious pulmonary nodules. No pleural effusion or pneumothorax.  Postprocedural changes in the lower left neck. The visualized thyroid is unremarkable. The heart is top normal in size. No pericardial effusion. Left chest port. No  suspicious mediastinal, hilar, or axillary lymphadenopathy. Surgical clips in the left axilla. Mild degenerative changes of the thoracic spine. IMPRESSION: 4.3 x 3.2 cm cavitary lesion/metastasis in the superior segment left lower lobe, mildly increased. Moderate paraseptal emphysematous changes. CT ABDOMEN AND PELVIS Findings: Gastrostomy tube in satisfactory position. Liver, pancreas, and adrenal glands are within normal limits. 5.9 x 4.1 cm rim calcified, multiloculated lesion in the medial spleen (series 2/image 63), unchanged. Suspected cholelithiasis (series 2/image 75), without associated inflammatory changes.  1.5 x 1.2 cm medial right upper pole renal cyst (series 2/image 71). Left kidney is within normal limits. No hydronephrosis. No evidence of bowel obstruction. Normal appendix. Atherosclerotic calcifications of the abdominal aorta and branch vessels. No abdominopelvic ascites. No suspicious abdominopelvic lymphadenopathy. Prostate is at the upper limits of normal for size. Bladder is within normal limits.  Degenerative changes of the lumbar spine. Known metastasis in the left iliac bone is not evident on CT.  IMPRESSION: Known metastasis in the left iliac bone is not evident on CT. No evidence of new/progressive metastatic disease in the abdomen/pelvis. Additional stable ancillary findings as above.  MRI CHEST WITHOUT AND WITH CONTRAST TECHNIQUE: Multiplanar multisequence MR imaging of the chest was performed  both before and after administration of intravenous contrast. CONTRAST: 20mL MULTIHANCE GADOBENATE DIMEGLUMINE 529 MG/ML IV SOLN COMPARISON: 05/16/2013 IMPRESSION:1. Interval enlargement and chest wall invasion associated with the 5.6 cm superior segment left lower lobe mass, with tumor extending into the left 5th rib and the adjacent 4th and 5th intercostal spaces. Abnormal enhancement compatible with tumor extension tracking anteriorly in the 5th intercostal space between the 5th and 6th ribs. Abnormal soft tissue encases the left 5th rib, and there is early involvement of the left 4th rib as well. 2. Rim calcified multi-septated cystic mass in the spleen. Similar appearance to prior exams - no hypermetabolism on prior PET-CT. 3. Cholelithiasis.  CT C/A/P 08/22/2013 IMPRESSION: 1. Interval expansion of pleural-based lesion in the left lower lobe with new this adjacent parenchymal nodules is most consistent with progression of metastatic carcinoma. 2. New right infrahilar low-density lymph node is concerning for new nodal metastasis.  3. Skeletal metastasis in the left iliac bone seen on PET-CT scan is again not identified by CT.  Impression and Plan: ASSESSMENT AND PLAN: 1. Recurrent left lateral tongue squamous cell carcinoma: He now has metastatic disease to the lung and bone and left chest wall. --He has been seen by [facility] (Dr. Marcille Blanco) with  plans for clinical trial enrollment.  They reviewed his  [id number] clinical sequencing without identifiable actionablelesions.  We discussed his treatment options.  Given his other disease sites remained well controlled, he agreed to maintain the same regimen (carbo/cetuximab/docetaxel). 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  I also provided Mr. Omahoney handouts detailing meta-analysis data that cetuximab-induced rash was associated with a significantly improved OS, PFS and ORR.   (Abdel-Rahman et. Mindi Junker, 2014 Aug 12). Alternatively, if there is progression to the other sites, we will consider transitioning to a new therapy.  This would include  methotrexate or other agent such as gemcitabine or perhaps pemetrexed.  Patient voiced understanding the treatment options and agreed to proceed with carbo/cetuximab/docetaxel.  Handouts and detailed risk and benefits and indications have been provided on prior visits.  We plan to restage in 3 months following re-starting his chemotherapy (in March 2015).    We will continue chemotherapy on 10/14/2013.  Next cycle on 2/09 then one week off (02/16).  He will follow up on 11/04/2012 for his next office visit and chemotherapy.    Oncology Flowsheet 10/14/2013   Day, Cycle Day 1, Cycle 41  CARBOplatin (PARAPLATIN) IV 180 mg  CARBOplatin test dose (PARAPLATIN) ID 100 mcg  cetuximab (ERBITUX) IV 150 mg/m2  dexamethasone (DECADRON) IV 20 mg  ondansetron (ZOFRAN) IV 16 mg  PACLitaxel (TAXOL) IV 48 mg/m2  zolendronic acid (ZOMETA) IV 4 mg   2. Skin rash secondary to Erbitux.  He is back on erbitux. Cetuximab-induced skin rash may represent a prognostic factor in patients with advanced solid tumors. For Grade 0, we will continue prophyloactic therapy with sunscreen SPF greater than or equal to 15; Grade 1, Hydrocortisone 2.5% cream and clindamycin 1 % gel with bi-weekly assessments; Grade 2, antibiotics (keflex) and hydrocortinonse 2.5%, desonide, alcometasone 0.05%; Grade 3 or intolerable grade 3, hydrocortisone 2.5%, desonide, alcometasone 0.05%and antibiotics and prednisone 0.5 mg/kg for sever days.  If no improvement for Grade 3 in 2 weeks, dose interruption will be required.  3. 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  4. Bone met: limited. S/p palliative XRT. He has been on Zometa every 6 weeks. Zometa given on 09/02/2013 and will be given with next chemotherapy.  5. Anemia: We  will follow him symptomatically for transfusion.   6. Smoking: none at this time.  7. Dysphagia: Bariam swallow then referral to speech and swallow for evaluation.  8. Left chest wall pain, insomnia.   Lidocaine patch, fentanyl and hydrocodone prn.    9. Follow up: in 4 weeks on 11/04/2013 with labs and chemotherapy.    All questions were answered. The patient knows to call the clinic with any problems, questions or concerns. We can certainly see the patient much sooner if necessary.  Patient provided prescriptions for fentanyl patch.   Patient also was provided an after visit clinical summary.    I spent 10 minutes or more than half the time coordinating care during 15 minute follow-up visit.    Sherine Cortese, MD 10/14/2013   1:45 PM

## 2013-10-14 NOTE — Telephone Encounter (Signed)
Per staff message and POF I have scheduled appts.  JMW  

## 2013-10-15 ENCOUNTER — Encounter: Payer: Self-pay | Admitting: Internal Medicine

## 2013-10-16 ENCOUNTER — Other Ambulatory Visit (HOSPITAL_COMMUNITY): Payer: Self-pay

## 2013-10-16 ENCOUNTER — Encounter: Payer: Self-pay | Admitting: Internal Medicine

## 2013-10-16 NOTE — Progress Notes (Signed)
Called and left Ms. McNeil a message with DSS to get what the deductible is for patient so I can get bills to get his medicaid active again.

## 2013-10-21 ENCOUNTER — Other Ambulatory Visit: Payer: Self-pay | Admitting: Internal Medicine

## 2013-10-21 ENCOUNTER — Ambulatory Visit (HOSPITAL_BASED_OUTPATIENT_CLINIC_OR_DEPARTMENT_OTHER): Payer: Medicaid Other

## 2013-10-21 ENCOUNTER — Ambulatory Visit: Payer: Medicaid Other | Admitting: Nutrition

## 2013-10-21 ENCOUNTER — Other Ambulatory Visit (HOSPITAL_BASED_OUTPATIENT_CLINIC_OR_DEPARTMENT_OTHER): Payer: Medicaid Other

## 2013-10-21 VITALS — BP 120/73 | HR 77 | Temp 97.4°F | Resp 18

## 2013-10-21 DIAGNOSIS — C770 Secondary and unspecified malignant neoplasm of lymph nodes of head, face and neck: Secondary | ICD-10-CM

## 2013-10-21 DIAGNOSIS — C7800 Secondary malignant neoplasm of unspecified lung: Secondary | ICD-10-CM

## 2013-10-21 DIAGNOSIS — C779 Secondary and unspecified malignant neoplasm of lymph node, unspecified: Secondary | ICD-10-CM

## 2013-10-21 DIAGNOSIS — Z5111 Encounter for antineoplastic chemotherapy: Secondary | ICD-10-CM

## 2013-10-21 DIAGNOSIS — C7952 Secondary malignant neoplasm of bone marrow: Secondary | ICD-10-CM

## 2013-10-21 DIAGNOSIS — C7951 Secondary malignant neoplasm of bone: Secondary | ICD-10-CM

## 2013-10-21 DIAGNOSIS — C029 Malignant neoplasm of tongue, unspecified: Secondary | ICD-10-CM

## 2013-10-21 DIAGNOSIS — Z5112 Encounter for antineoplastic immunotherapy: Secondary | ICD-10-CM

## 2013-10-21 DIAGNOSIS — C50919 Malignant neoplasm of unspecified site of unspecified female breast: Secondary | ICD-10-CM

## 2013-10-21 LAB — CBC WITH DIFFERENTIAL/PLATELET
BASO%: 0.6 % (ref 0.0–2.0)
Basophils Absolute: 0 10*3/uL (ref 0.0–0.1)
EOS ABS: 0.3 10*3/uL (ref 0.0–0.5)
EOS%: 5 % (ref 0.0–7.0)
HEMATOCRIT: 29.6 % — AB (ref 38.4–49.9)
HGB: 9.3 g/dL — ABNORMAL LOW (ref 13.0–17.1)
LYMPH%: 16.7 % (ref 14.0–49.0)
MCH: 29.4 pg (ref 27.2–33.4)
MCHC: 31.4 g/dL — ABNORMAL LOW (ref 32.0–36.0)
MCV: 93.7 fL (ref 79.3–98.0)
MONO#: 0.3 10*3/uL (ref 0.1–0.9)
MONO%: 6.4 % (ref 0.0–14.0)
NEUT%: 71.3 % (ref 39.0–75.0)
NEUTROS ABS: 3.7 10*3/uL (ref 1.5–6.5)
PLATELETS: 246 10*3/uL (ref 140–400)
RBC: 3.16 10*6/uL — ABNORMAL LOW (ref 4.20–5.82)
RDW: 17.7 % — ABNORMAL HIGH (ref 11.0–14.6)
WBC: 5.2 10*3/uL (ref 4.0–10.3)
lymph#: 0.9 10*3/uL (ref 0.9–3.3)
nRBC: 0 % (ref 0–0)

## 2013-10-21 MED ORDER — DIPHENHYDRAMINE HCL 50 MG/ML IJ SOLN
INTRAMUSCULAR | Status: AC
  Filled 2013-10-21: qty 1

## 2013-10-21 MED ORDER — DIPHENHYDRAMINE HCL 50 MG/ML IJ SOLN
50.0000 mg | Freq: Once | INTRAMUSCULAR | Status: AC
  Administered 2013-10-21: 50 mg via INTRAVENOUS

## 2013-10-21 MED ORDER — PACLITAXEL CHEMO INJECTION 300 MG/50ML
48.0000 mg/m2 | Freq: Once | INTRAVENOUS | Status: AC
  Administered 2013-10-21: 90 mg via INTRAVENOUS
  Filled 2013-10-21: qty 15

## 2013-10-21 MED ORDER — ONDANSETRON 16 MG/50ML IVPB (CHCC)
INTRAVENOUS | Status: AC
  Filled 2013-10-21: qty 16

## 2013-10-21 MED ORDER — SODIUM CHLORIDE 0.9 % IV SOLN
180.0000 mg | Freq: Once | INTRAVENOUS | Status: AC
  Administered 2013-10-21: 180 mg via INTRAVENOUS
  Filled 2013-10-21: qty 18

## 2013-10-21 MED ORDER — DEXAMETHASONE SODIUM PHOSPHATE 20 MG/5ML IJ SOLN
20.0000 mg | Freq: Once | INTRAMUSCULAR | Status: AC
  Administered 2013-10-21: 20 mg via INTRAVENOUS

## 2013-10-21 MED ORDER — DEXAMETHASONE SODIUM PHOSPHATE 20 MG/5ML IJ SOLN
INTRAMUSCULAR | Status: AC
  Filled 2013-10-21: qty 5

## 2013-10-21 MED ORDER — SODIUM CHLORIDE 0.9 % IV SOLN
Freq: Once | INTRAVENOUS | Status: AC
  Administered 2013-10-21: 13:00:00 via INTRAVENOUS

## 2013-10-21 MED ORDER — ONDANSETRON 16 MG/50ML IVPB (CHCC)
16.0000 mg | Freq: Once | INTRAVENOUS | Status: AC
  Administered 2013-10-21: 16 mg via INTRAVENOUS

## 2013-10-21 MED ORDER — CETUXIMAB CHEMO IV INJECTION 200 MG/100ML
150.0000 mg/m2 | Freq: Once | INTRAVENOUS | Status: AC
  Administered 2013-10-21: 300 mg via INTRAVENOUS
  Filled 2013-10-21: qty 150

## 2013-10-21 MED ORDER — FAMOTIDINE IN NACL 20-0.9 MG/50ML-% IV SOLN
20.0000 mg | Freq: Once | INTRAVENOUS | Status: AC
  Administered 2013-10-21: 20 mg via INTRAVENOUS

## 2013-10-21 MED ORDER — FAMOTIDINE IN NACL 20-0.9 MG/50ML-% IV SOLN
INTRAVENOUS | Status: AC
  Filled 2013-10-21: qty 50

## 2013-10-21 MED ORDER — SODIUM CHLORIDE 0.9 % IJ SOLN
10.0000 mL | INTRAMUSCULAR | Status: DC | PRN
  Administered 2013-10-21: 10 mL
  Filled 2013-10-21: qty 10

## 2013-10-21 MED ORDER — CARBOPLATIN CHEMO INTRADERMAL TEST DOSE 100MCG/0.02ML
100.0000 ug | Freq: Once | INTRADERMAL | Status: AC
  Administered 2013-10-21: 100 ug via INTRADERMAL
  Filled 2013-10-21: qty 0.01

## 2013-10-21 MED ORDER — HEPARIN SOD (PORK) LOCK FLUSH 100 UNIT/ML IV SOLN
500.0000 [IU] | Freq: Once | INTRAVENOUS | Status: AC | PRN
  Administered 2013-10-21: 500 [IU]
  Filled 2013-10-21: qty 5

## 2013-10-21 NOTE — Progress Notes (Signed)
Patient requested to speak to me today.  I have followed patient the past 2 years on and off.  Patient has a feeding tube which he states he uses daily but he has no trouble tolerating oral intake of Ensure Plus.  Patient is wondering if he could have his feeding tube removed.  He states that he is supposed to have this replaced.  He is fearful of the procedure itself.  Patient's weight has actually increased over time.  I recommended patient discuss feeding tube removal with physician.  I've educated him that if he is using feeding tube for nutrition support it would be best for him to leave it in place or have it replaced. Patient agreeable to discussing with physician.

## 2013-10-21 NOTE — Patient Instructions (Signed)
[facility] Discharge Instructions for Patients Receiving Chemotherapy  Today you received the following chemotherapy agents Erbitux, Taxol and Carboplatin.  To help prevent nausea and vomiting after your treatment, we encourage you to take your nausea medication.   If you develop nausea and vomiting that is not controlled by your nausea medication, call the clinic.   BELOW ARE SYMPTOMS THAT SHOULD BE REPORTED IMMEDIATELY:  *FEVER GREATER THAN 100.5 F  *CHILLS WITH OR WITHOUT FEVER  NAUSEA AND VOMITING THAT IS NOT CONTROLLED WITH YOUR NAUSEA MEDICATION  *UNUSUAL SHORTNESS OF BREATH  *UNUSUAL BRUISING OR BLEEDING  TENDERNESS IN MOUTH AND THROAT WITH OR WITHOUT PRESENCE OF ULCERS  *URINARY PROBLEMS  *BOWEL PROBLEMS  UNUSUAL RASH Items with * indicate a potential emergency and should be followed up as soon as possible.  Feel free to call the clinic you have any questions or concerns. The clinic phone number is [phone].

## 2013-10-22 ENCOUNTER — Other Ambulatory Visit: Payer: Self-pay | Admitting: Medical Oncology

## 2013-10-22 DIAGNOSIS — C7951 Secondary malignant neoplasm of bone: Secondary | ICD-10-CM

## 2013-10-22 DIAGNOSIS — C029 Malignant neoplasm of tongue, unspecified: Secondary | ICD-10-CM

## 2013-10-22 DIAGNOSIS — C770 Secondary and unspecified malignant neoplasm of lymph nodes of head, face and neck: Secondary | ICD-10-CM

## 2013-10-22 MED ORDER — HYDROCODONE-ACETAMINOPHEN 5-325 MG PO TABS: ORAL_TABLET | ORAL | Status: DC

## 2013-10-22 MED ORDER — LORAZEPAM 0.5 MG PO TABS: 0.5000 mg | ORAL_TABLET | Freq: Three times a day (TID) | ORAL | Status: DC | PRN

## 2013-10-22 MED ORDER — GABAPENTIN 300 MG PO CAPS: ORAL_CAPSULE | ORAL | Status: DC

## 2013-10-25 ENCOUNTER — Other Ambulatory Visit: Payer: Self-pay | Admitting: Internal Medicine

## 2013-10-25 ENCOUNTER — Other Ambulatory Visit: Payer: Self-pay

## 2013-10-25 DIAGNOSIS — G893 Neoplasm related pain (acute) (chronic): Secondary | ICD-10-CM | POA: Insufficient documentation

## 2013-10-25 DIAGNOSIS — B669 Fluke infection, unspecified: Secondary | ICD-10-CM

## 2013-10-25 DIAGNOSIS — F419 Anxiety disorder, unspecified: Secondary | ICD-10-CM

## 2013-10-25 MED ORDER — LORAZEPAM 0.5 MG PO TABS: 0.5000 mg | ORAL_TABLET | Freq: Four times a day (QID) | ORAL | Status: DC | PRN

## 2013-10-25 NOTE — Telephone Encounter (Signed)
See documentation 10/15/13

## 2013-10-25 NOTE — Addendum Note (Signed)
Addended by: Janace Hoard on: 10/25/2013 01:23 PM   Modules accepted: Orders

## 2013-10-28 ENCOUNTER — Ambulatory Visit: Payer: Self-pay

## 2013-11-04 ENCOUNTER — Other Ambulatory Visit (HOSPITAL_BASED_OUTPATIENT_CLINIC_OR_DEPARTMENT_OTHER): Payer: Medicaid Other

## 2013-11-04 ENCOUNTER — Ambulatory Visit (HOSPITAL_BASED_OUTPATIENT_CLINIC_OR_DEPARTMENT_OTHER): Payer: Medicaid Other

## 2013-11-04 ENCOUNTER — Telehealth: Payer: Self-pay | Admitting: *Deleted

## 2013-11-04 ENCOUNTER — Telehealth: Payer: Self-pay | Admitting: Internal Medicine

## 2013-11-04 ENCOUNTER — Ambulatory Visit (HOSPITAL_BASED_OUTPATIENT_CLINIC_OR_DEPARTMENT_OTHER): Payer: Medicaid Other | Admitting: Internal Medicine

## 2013-11-04 ENCOUNTER — Encounter: Payer: Self-pay | Admitting: Internal Medicine

## 2013-11-04 ENCOUNTER — Encounter: Payer: Self-pay | Admitting: *Deleted

## 2013-11-04 VITALS — BP 126/78 | HR 96 | Temp 98.4°F | Resp 18 | Ht 72.0 in | Wt 178.7 lb

## 2013-11-04 DIAGNOSIS — D649 Anemia, unspecified: Secondary | ICD-10-CM

## 2013-11-04 DIAGNOSIS — C029 Malignant neoplasm of tongue, unspecified: Secondary | ICD-10-CM

## 2013-11-04 DIAGNOSIS — C50919 Malignant neoplasm of unspecified site of unspecified female breast: Secondary | ICD-10-CM

## 2013-11-04 DIAGNOSIS — C7951 Secondary malignant neoplasm of bone: Secondary | ICD-10-CM

## 2013-11-04 DIAGNOSIS — C770 Secondary and unspecified malignant neoplasm of lymph nodes of head, face and neck: Secondary | ICD-10-CM

## 2013-11-04 DIAGNOSIS — C779 Secondary and unspecified malignant neoplasm of lymph node, unspecified: Secondary | ICD-10-CM

## 2013-11-04 DIAGNOSIS — C7800 Secondary malignant neoplasm of unspecified lung: Secondary | ICD-10-CM

## 2013-11-04 DIAGNOSIS — Z5111 Encounter for antineoplastic chemotherapy: Secondary | ICD-10-CM

## 2013-11-04 DIAGNOSIS — R21 Rash and other nonspecific skin eruption: Secondary | ICD-10-CM

## 2013-11-04 DIAGNOSIS — Z5112 Encounter for antineoplastic immunotherapy: Secondary | ICD-10-CM

## 2013-11-04 DIAGNOSIS — C7952 Secondary malignant neoplasm of bone marrow: Secondary | ICD-10-CM

## 2013-11-04 LAB — CBC WITH DIFFERENTIAL/PLATELET
BASO%: 0.5 % (ref 0.0–2.0)
BASOS ABS: 0 10*3/uL (ref 0.0–0.1)
EOS ABS: 0.2 10*3/uL (ref 0.0–0.5)
EOS%: 3 % (ref 0.0–7.0)
HEMATOCRIT: 29.8 % — AB (ref 38.4–49.9)
HEMOGLOBIN: 9.3 g/dL — AB (ref 13.0–17.1)
LYMPH%: 10.4 % — AB (ref 14.0–49.0)
MCH: 29.6 pg (ref 27.2–33.4)
MCHC: 31.2 g/dL — ABNORMAL LOW (ref 32.0–36.0)
MCV: 94.9 fL (ref 79.3–98.0)
MONO#: 0.7 10*3/uL (ref 0.1–0.9)
MONO%: 11.9 % (ref 0.0–14.0)
NEUT%: 74.2 % (ref 39.0–75.0)
NEUTROS ABS: 4.2 10*3/uL (ref 1.5–6.5)
NRBC: 0 % (ref 0–0)
Platelets: 252 10*3/uL (ref 140–400)
RBC: 3.14 10*6/uL — AB (ref 4.20–5.82)
RDW: 17.4 % — ABNORMAL HIGH (ref 11.0–14.6)
WBC: 5.7 10*3/uL (ref 4.0–10.3)
lymph#: 0.6 10*3/uL — ABNORMAL LOW (ref 0.9–3.3)

## 2013-11-04 LAB — COMPREHENSIVE METABOLIC PANEL (CC13)
ALT: 11 U/L (ref 0–55)
ANION GAP: 11 meq/L (ref 3–11)
AST: 16 U/L (ref 5–34)
Albumin: 3.2 g/dL — ABNORMAL LOW (ref 3.5–5.0)
Alkaline Phosphatase: 65 U/L (ref 40–150)
BUN: 8.8 mg/dL (ref 7.0–26.0)
CHLORIDE: 101 meq/L (ref 98–109)
CO2: 27 meq/L (ref 22–29)
CREATININE: 0.6 mg/dL — AB (ref 0.7–1.3)
Calcium: 9.6 mg/dL (ref 8.4–10.4)
GLUCOSE: 106 mg/dL (ref 70–140)
Potassium: 3.8 mEq/L (ref 3.5–5.1)
Sodium: 139 mEq/L (ref 136–145)
Total Bilirubin: 0.27 mg/dL (ref 0.20–1.20)
Total Protein: 7.6 g/dL (ref 6.4–8.3)

## 2013-11-04 MED ORDER — CARBOPLATIN CHEMO INTRADERMAL TEST DOSE 100MCG/0.02ML
100.0000 ug | Freq: Once | INTRADERMAL | Status: AC
  Administered 2013-11-04: 100 ug via INTRADERMAL
  Filled 2013-11-04: qty 0.01

## 2013-11-04 MED ORDER — CETUXIMAB CHEMO IV INJECTION 200 MG/100ML
150.0000 mg/m2 | Freq: Once | INTRAVENOUS | Status: AC
  Administered 2013-11-04: 300 mg via INTRAVENOUS
  Filled 2013-11-04: qty 150

## 2013-11-04 MED ORDER — DIPHENHYDRAMINE HCL 50 MG/ML IJ SOLN
50.0000 mg | Freq: Once | INTRAMUSCULAR | Status: AC
  Administered 2013-11-04: 50 mg via INTRAVENOUS

## 2013-11-04 MED ORDER — DEXAMETHASONE SODIUM PHOSPHATE 20 MG/5ML IJ SOLN
20.0000 mg | Freq: Once | INTRAMUSCULAR | Status: AC
  Administered 2013-11-04: 20 mg via INTRAVENOUS

## 2013-11-04 MED ORDER — FAMOTIDINE IN NACL 20-0.9 MG/50ML-% IV SOLN
INTRAVENOUS | Status: AC
  Filled 2013-11-04: qty 50

## 2013-11-04 MED ORDER — HEPARIN SOD (PORK) LOCK FLUSH 100 UNIT/ML IV SOLN
500.0000 [IU] | Freq: Once | INTRAVENOUS | Status: AC | PRN
  Administered 2013-11-04: 500 [IU]
  Filled 2013-11-04: qty 5

## 2013-11-04 MED ORDER — SODIUM CHLORIDE 0.9 % IV SOLN
48.0000 mg/m2 | Freq: Once | INTRAVENOUS | Status: AC
  Administered 2013-11-04: 90 mg via INTRAVENOUS
  Filled 2013-11-04: qty 15

## 2013-11-04 MED ORDER — SODIUM CHLORIDE 0.9 % IV SOLN
Freq: Once | INTRAVENOUS | Status: AC
  Administered 2013-11-04: 11:00:00 via INTRAVENOUS

## 2013-11-04 MED ORDER — DEXAMETHASONE 2 MG PO TABS: 2.0000 mg | ORAL_TABLET | Freq: Every day | ORAL | Status: AC

## 2013-11-04 MED ORDER — FENTANYL 50 MCG/HR TD PT72: 50.0000 ug | MEDICATED_PATCH | TRANSDERMAL | Status: DC

## 2013-11-04 MED ORDER — ONDANSETRON 16 MG/50ML IVPB (CHCC)
INTRAVENOUS | Status: AC
  Filled 2013-11-04: qty 16

## 2013-11-04 MED ORDER — ONDANSETRON 16 MG/50ML IVPB (CHCC)
16.0000 mg | Freq: Once | INTRAVENOUS | Status: AC
  Administered 2013-11-04: 16 mg via INTRAVENOUS

## 2013-11-04 MED ORDER — SODIUM CHLORIDE 0.9 % IV SOLN
180.0000 mg | Freq: Once | INTRAVENOUS | Status: AC
  Administered 2013-11-04: 180 mg via INTRAVENOUS
  Filled 2013-11-04: qty 18

## 2013-11-04 MED ORDER — DIPHENHYDRAMINE HCL 50 MG/ML IJ SOLN
INTRAMUSCULAR | Status: AC
  Filled 2013-11-04: qty 1

## 2013-11-04 MED ORDER — LIDOCAINE 5 % EX PTCH: 1.0000 | MEDICATED_PATCH | CUTANEOUS | Status: DC

## 2013-11-04 MED ORDER — FAMOTIDINE IN NACL 20-0.9 MG/50ML-% IV SOLN
20.0000 mg | Freq: Once | INTRAVENOUS | Status: AC
  Administered 2013-11-04: 20 mg via INTRAVENOUS

## 2013-11-04 MED ORDER — FENTANYL 25 MCG/HR TD PT72: 25.0000 ug | MEDICATED_PATCH | TRANSDERMAL | Status: DC

## 2013-11-04 MED ORDER — SODIUM CHLORIDE 0.9 % IJ SOLN
10.0000 mL | INTRAMUSCULAR | Status: DC | PRN
Start: 2013-11-04 — End: 2013-11-04
  Administered 2013-11-04: 10 mL
  Filled 2013-11-04: qty 10

## 2013-11-04 MED ORDER — DEXAMETHASONE SODIUM PHOSPHATE 20 MG/5ML IJ SOLN
INTRAMUSCULAR | Status: AC
  Filled 2013-11-04: qty 5

## 2013-11-04 NOTE — Progress Notes (Signed)
[name] Social Work  Clinical Social Work was referred by patient to review and complete healthcare advance directives.  Clinical Social Worker met with patient in infusion room.  The patient designated Thomas Bean as their primary healthcare agent and Thomas Bean and Thomas Bean as their secondary agents.  Patient also completed healthcare living will through the "five wishes" document.  Clinical Social Worker notarized documents and made copies for patient/family. Clinical Social Worker will send documents to be scanned into patient's medical record.  CSW provided copy to patient's medical oncologist. Clinical Social Worker encouraged patient/family to contact with any additional questions or concerns.  Polo Riley, MSW, [place] Worker [facility] [phone]

## 2013-11-04 NOTE — Patient Instructions (Signed)
[place] Discharge Instructions for Patients Receiving Chemotherapy  Today you received the following chemotherapy agents erbitux, taxol and carboplatin  To help prevent nausea and vomiting after your treatment, we encourage you to take your nausea medication if needed.   If you develop nausea and vomiting that is not controlled by your nausea medication, call the clinic.   BELOW ARE SYMPTOMS THAT SHOULD BE REPORTED IMMEDIATELY:  *FEVER GREATER THAN 100.5 F  *CHILLS WITH OR WITHOUT FEVER  NAUSEA AND VOMITING THAT IS NOT CONTROLLED WITH YOUR NAUSEA MEDICATION  *UNUSUAL SHORTNESS OF BREATH  *UNUSUAL BRUISING OR BLEEDING  TENDERNESS IN MOUTH AND THROAT WITH OR WITHOUT PRESENCE OF ULCERS  *URINARY PROBLEMS  *BOWEL PROBLEMS  UNUSUAL RASH Items with * indicate a potential emergency and should be followed up as soon as possible.  Feel free to call the clinic you have any questions or concerns. The clinic phone number is [phone].

## 2013-11-04 NOTE — Telephone Encounter (Signed)
Per staff message and POF I have scheduled appts.  JMW  

## 2013-11-04 NOTE — Telephone Encounter (Signed)
gave pt appt for lab,md and chemo for march, gave pt oral contrast for CT

## 2013-11-04 NOTE — Progress Notes (Signed)
[place], MD [place] [address]  DIAGNOSIS: Squamous cell carcinoma of tongue - Plan: fentaNYL (DURAGESIC - DOSED MCG/HR) 50 MCG/HR, fentaNYL (DURAGESIC - DOSED MCG/HR) 25 MCG/HR patch, dexamethasone (DECADRON) 2 MG tablet, lidocaine (LIDODERM) 5 %, CBC with Differential, Basic metabolic panel (Bmet) - [facility], CBC with Differential, Comprehensive metabolic panel (Cmet) - [facility], CT Abdomen Pelvis W Contrast, CT Chest W Contrast  Bone metastasis - Plan: fentaNYL (DURAGESIC - DOSED MCG/HR) 50 MCG/HR, fentaNYL (DURAGESIC - DOSED MCG/HR) 25 MCG/HR patch, dexamethasone (DECADRON) 2 MG tablet, lidocaine (LIDODERM) 5 %, CBC with Differential, Basic metabolic panel (Bmet) - [facility], CBC with Differential, Comprehensive metabolic panel (Cmet) - [facility], CT Abdomen Pelvis W Contrast, CT Chest W Contrast  Cancer of head, face, or neck lymph nodes, secondary - Plan: lidocaine (LIDODERM) 5 %  Chief Complaint  Patient presents with  . Squamous cell carcinoma of tongue    CURRENT TREATMENT: Started on 05/15/2012 palliative chemo weekly Carboplatin/Taxol/Erbitux; 3 weeks on; 1 week off. Due to prolonged cytopenia, his chemo had been changed to weekly; 2 weeks on, 1 week off. He is on zometa q 4-6 weeks.      Squamous cell carcinoma of tongue   01/03/2011 Initial Diagnosis Squamous cell carcinoma of tongue   01/03/2011 Surgery Squamous cell carcinoma of the left lateral tongue s/p excision.    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 (Dr. Lucia Gaskins and Dr. Ernesto Rutherford)    07/27/2011 Pathology A  total 15 LN from left radical neck dissection that was all (-); the left submandibular gland mass showed a 3.5 cm invasive SCC, moderately differentiated involving the adjacent skeletal muscle tissue and bone extending margin; No invasion lymph or  per   09/29/2011 Surgery He underwent further resection by Dr. Caryl Never at [facility]   09/29/2011 Pathology Mandible w L neck tissue, parital mandibulectomy: Inv SCC, Tumor size 2.6 cm; moderately to poorly differentiated (grade 3); mandibular bone invovled, extending to skeletal muscle. + perineural invasion; Neg lymphvascular Invasion. Pos margins.    11/15/2011 Concurrent Chemotherapy He received adj chemoradiation w q 3 wk cisplatin (3/5 - 12/05/11). Receive 2 of 3 doses, 3rd dose held due to febrile neutropenia, flap abscess.     11/15/2011 - 01/05/2012 Radiation Therapy He received concurrent XRT.    03/12/2012 Relapse/Recurrence He had right neck recurrence.   03/12/2012 Surgery He underwent a palliative right neck dissection by Dr. Owens Shark at [facility].    03/27/2012 Pathology Invasive moderately to poorly differentiate SCC w tumor invovling skeletal muscle.  Extensive perineural invasion identified. No LVI. No maligancy in 18 LN.    03/29/2012 Imaging CT Chest at [facility].  18 x 18 mm LUL lung nodule suspicious for metastases.   04/02/2012 Procedure LLL biopsy.    04/02/2012 Pathology LLL biopsy c/w SCC.    04/25/2012 Cancer Staging PET/CT: 1.  Response to therapy within the head neck,  withotu evidence of residual hypermetabolic disease. Superior segment LLL cavitary hypermetabolic lung nodule.  This could represent an isolated metastasis ormetachronous primary.  L iliac bone met   04/25/2012 Progression Increased in size of lytic lesion in the posterior aspect of the L iliac bone.    05/15/2012 - 07/01/2013 Chemotherapy Started on palliative chemo weekly with carboplatin/taxol/erbitux (3 weeks on and 1 week off).  Due to prolonged cytopenia, his chemo was changed to weekly (2 weeks on,  1 weeek off)   07/05/2012 Imaging CT CAP: Interval decrease in size of left lower lobe cavitary massfollowing therapy.  Lytic lesion in the posterior aspect of the left iliac bone is increased size compared to prior.  This was  hypermetabolic on comparison PET CT scan.   07/16/2012 -  Chemotherapy Zometa 4 mg IV every 4 weeks started given evidence of bone metastases.   08/25/2012 Procedure IR Gastrostomy Tube placed.    10/29/2012 Imaging CT CAP. 2.3 cm cavitary lesion in the left lower lobe, decreased. Mild nodularity along the left fissure, mildly increased. Attention on follow-up suggested.    03/01/2013 Imaging CT C/A/P. 1.  Enlargement of dominant cavitary left lower lobe lesion.  This could reflect metastatic squamous cell carcinoma or by a primary lung malignancy. 2.  No other evidence of thoracic metastatic disease.   05/16/2013 Imaging CT CAP: 4.3 x 3.2 cm cavitary lesion/metastasis in the superior segment left lower lobe, mildly increased. No evidence of disease in abdomen or pelvis.     07/17/2013 - 07/30/2013 Radiation Therapy Palliative XRT to the left chest wall 30 gy per fraction x 10 fractions    08/22/2013 Imaging CT CAP: 1. Interval expansion of pleural-based lesion in the LLL with new this adjacent parenchymal nodules is most c/w progression of metastatic carcinoma. 2. New right infrahilar low-density lymph node is concerning for new nodal mets   08/22/2013 Progression LLL pleural-based lesion increased in size.    09/02/2013 -  Chemotherapy Restarted on palliative chemo weekly with carboplatin/taxol/erbitux (2 weeks on, 1 weeek off). No active clinical trials at [facility] (Dr. Marcille Blanco).  if further progression, we will try MTX.     INTERVAL HISTORY: Thomas Bean 53 y.o. male returns for regular follow up visit. He was seen by me on 10/14/2013. His pain medications have been increased from fentanyl 50 mcg/hr to 75 mcg/hr due to worsening left chest pain.His other pain medications include his gabapentin and hydrocodone 5/325 prn.   Today, he is accompanied by his father. Otherwise, he continues to have a PEG with osmolite 1.5 at 60 ml/hr for 12 hours nocturnal continuosly feeding daily. The patient denied  fever, chills, night sweats, change in appetite or weight. He denied headaches, double vision, blurry vision, nasal congestion, nasal discharge, odynophagia or dysphagia. No palpitations, dyspnea, cough, abdominal pain, vomiting, diarrhea, hematochezia. The patient denied dysuria, nocturia, polyuria, hematuria, myalgia, tingling.  MEDICAL HISTORY: Past Medical History  Diagnosis Date  . Hemorrhoids   . Hypertension     no medications at present  . GERD (gastroesophageal reflux disease)     no medication  . Depression     treated approx 12 years ago, no treatment at this time  . Hiatal hernia   . Lesion of left lung 03/14/12    CT chest, [facility], squamous cell carcinoma  . Facial rash 05/24/2012  . Tongue cancer 12/2010    left side of tongue T1 lesion excised 4/12  . Lung metastasis dx'd 03/2012  . Cancer 08/2011    oral tongue  . Hx of radiation therapy 07/24/12- 07-26-12    left iliac bone, 18 Gy 3 sessions  . Hx of radiation therapy 11/15/11- 01/05/12    left neck, oral cavity6600 cGy 33 sessions  . Bone metastasis 04/25/12    PET scan-left iliac bone  . Hx antineoplastic chemotherapy 2013    Carboplatin/Taxol/Erbitux  . 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    ALLERGIES:  is allergic to codeine phosphate; morphine and related; and tape.  MEDICATIONS: has a current medication list which includes the following prescription(s): clindamycin, fentanyl, gabapentin, hydrocodone-acetaminophen, lidocaine, lidocaine-prilocaine, lorazepam, feeding  supplement (osmolite 1.5 cal), omeprazole, ondansetron, prochlorperazine, sertraline, dexamethasone, fentanyl, and polyethylene glycol, and the following Facility-Administered Medications: CARBOplatin (PARAPLATIN) 180 mg in sodium chloride 0.9 % 100 mL chemo infusion, cetuximab, and PACLitaxel (TAXOL) 90 mg in sodium chloride 0.9 % 250 mL chemo infusion (</= $RemoveBefor'80mg'xnlHxbFgXVlB$ /m2).  SURGICAL HISTORY:  Past Surgical History  Procedure Laterality Date  . Inguinal herniorrhapy    . Tonsillectomy and adenoidectomy    . Excision of tongue mass      April 2012  . Hernia repair    . Neck mass excision  07/27/2011  . Radical neck dissection  07/27/2011    Procedure: RADICAL NECK DISSECTION;  Surgeon: Rozetta Nunnery, MD;  Location: [employer];  Service: ENT;  Laterality: Left;  Marland Kitchen Mandibulectomy  09/2011    and scapular free flap  . Modified radical neck dissection  03/27/12    excision of lip lesion, lip benign, right neck squamous cell ca    REVIEW OF SYSTEMS:   Constitutional: Denies fevers, chills or abnormal weight loss, 6 out of 10 pain to left rib cage, neck  Eyes: Denies blurriness of vision Ears, nose, mouth, throat, and face: Denies mucositis or sore throat Respiratory: Denies cough, dyspnea or wheezes Cardiovascular: Denies palpitation, chest discomfort or lower extremity swelling Gastrointestinal:  Denies nausea, heartburn or change in bowel habits; positive for trouble swallowing but improves with post-prandial hydration.  Skin: Reports pustular rash on face, back and chest Lymphatics: Denies new lymphadenopathy or easy bruising Neurological:Denies numbness, tingling or new weaknesses Behavioral/Psych: Mood is stable, no new changes  All other systems were reviewed with the patient and are negative.  PHYSICAL EXAMINATION: ECOG PERFORMANCE STATUS: 1 - Symptomatic but completely ambulatory  Blood pressure 126/78, pulse 96, temperature 98.4 F (36.9 C), resp. rate 18, height 6' (1.829 m), weight  178 lb 11.2 oz (81.058 kg), SpO2 98.00%.  General appearance: alert, cooperative, appears stated age and no distress  Head: Normocephalic, without obvious abnormality, atraumatic  HEENT: Sclerae anicteric. Conjuncitvae pink. PERRLA; Oral mucosa moist without ulceration or thrush.  Neck: + cervical adenopathy about 1 cm rubbery and non-tender, supple, symmetrical, trachea midline and thyroid not enlarged, symmetric, no tenderness/mass/nodules. No submandibular adenopathy. S/p major neck surgery with neck scars well healed.  Lymph nodes: Cervical adenopathy: as noted above. Supraclavicular adenopathy: None appreciated; No  Heart:regular rate and rhythm, S1, S2 normal, no murmur, click, rub or gallop  Lung:chest clear, no wheezing, rales, normal symmetric air entry, Heart exam - S1, S2 normal, no murmur, no gallop, rate regular  Abdomin: soft, non-tender, without masses or organomegaly + G tube without signs of infections.  EXT: No peripheral edema; positive tenderness over left scapular, left chest wall.  Skin: Rash on face, back, chest present since restarting Erbitux. + Left Chest port-a-cath without tenderness or fluctuance    Labs:  Lab Results  Component Value Date   WBC 5.7 11/04/2013   HGB 9.3* 11/04/2013  HCT 29.8* 11/04/2013   MCV 94.9 11/04/2013   PLT 252 11/04/2013   NEUTROABS 4.2 11/04/2013      Chemistry      Component Value Date/Time   NA 139 11/04/2013 0919   NA 138 06/25/2012 0858   K 3.8 11/04/2013 0919   K 3.1* 06/25/2012 0858   CL 102 02/25/2013 0753   CL 99 06/25/2012 0858   CO2 27 11/04/2013 0919   CO2 30 06/25/2012 0858   BUN 8.8 11/04/2013 0919   BUN 10 06/25/2012 0858   CREATININE 0.6* 11/04/2013 0919   CREATININE 0.47* 06/25/2012 0858      Component Value Date/Time   CALCIUM 9.6 11/04/2013 0919   CALCIUM 9.6 06/25/2012 0858   ALKPHOS 65 11/04/2013 0919   ALKPHOS 54 05/03/2012 0905   AST 16 11/04/2013 0919   AST 14 05/03/2012 0905   ALT 11 11/04/2013 0919   ALT  11 05/03/2012 0905   BILITOT 0.27 11/04/2013 0919   BILITOT 0.3 05/03/2012 0905       Basic Metabolic Panel:  Recent Labs Lab 11/04/13 0919  NA 139  K 3.8  CO2 27  GLUCOSE 106  BUN 8.8  CREATININE 0.6*  CALCIUM 9.6   GFR Estimated Creatinine Clearance: 117.2 ml/min (by C-G formula based on Cr of 0.6). Liver Function Tests:  Recent Labs Lab 11/04/13 0919  AST 16  ALT 11  ALKPHOS 65  BILITOT 0.27  PROT 7.6  ALBUMIN 3.2*   CBC:  Recent Labs Lab 11/04/13 0918  WBC 5.7  NEUTROABS 4.2  HGB 9.3*  HCT 29.8*  MCV 94.9  PLT 252    Studies:  No results found.   RADIOGRAPHIC STUDIES: No results found.  ASSESSMENT: Thomas CALLICOTT 53 y.o. male with a history of Squamous cell carcinoma of tongue - Plan: fentaNYL (DURAGESIC - DOSED MCG/HR) 50 MCG/HR, fentaNYL (DURAGESIC - DOSED MCG/HR) 25 MCG/HR patch, dexamethasone (DECADRON) 2 MG tablet, lidocaine (LIDODERM) 5 %, CBC with Differential, Basic metabolic panel (Bmet) - [facility], CBC with Differential, Comprehensive metabolic panel (Cmet) - [facility], CT Abdomen Pelvis W Contrast, CT Chest W Contrast  Bone metastasis - Plan: fentaNYL (DURAGESIC - DOSED MCG/HR) 50 MCG/HR, fentaNYL (DURAGESIC - DOSED MCG/HR) 25 MCG/HR patch, dexamethasone (DECADRON) 2 MG tablet, lidocaine (LIDODERM) 5 %, CBC with Differential, Basic metabolic panel (Bmet) - [facility], CBC with Differential, Comprehensive metabolic panel (Cmet) - [facility], CT Abdomen Pelvis W Contrast, CT Chest W Contrast  Cancer of head, face, or neck lymph nodes, secondary - Plan: lidocaine (LIDODERM) 5 %   PLAN:  1. Recurrent left lateral tongue squamous cell carcinoma: He now has metastatic disease to the lung and bone and left chest wall.  --He complains of more pain to left chest wall concerning for progressive disease.  --He has been seen by [facility] (Dr. Marcille Blanco) with plans for clinical trial enrollment. They reviewed his [id number] clinical sequencing without identifiable  actionablelesions. We discussed his treatment options. Given his other disease sites remained well controlled, he agreed to maintain the same regimen (carbo/cetuximab/docetaxel). 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 --Alternatively, if there is progression to the other sites, we will consider transitioning to a new therapy. This would include methotrexate or other agent such as gemcitabine or perhaps pemetrexed. Patient voiced understanding the treatment options and agreed to proceed with carbo/cetuximab/docetaxel. Handouts and detailed risk and benefits and indications have been provided on prior visits.  We plan to  restage in 3 months following re-starting his chemotherapy (in March 2015). We will continue chemotherapy on 11/04/2013. Next cycle on 03/02 then one week off (03/09). He will follow up on 11/25/2012 for his next office visit and chemotherapy.   2. Skin rash secondary to Erbitux. He is back on erbitux. Cetuximab-induced skin rash may represent a prognostic factor in patients with advanced solid tumors. For Grade 0, we will continue prophyloactic therapy with sunscreen SPF greater than or equal to 15; Grade 1, Hydrocortisone 2.5% cream and clindamycin 1 % gel with bi-weekly assessments; Grade 2, antibiotics (keflex) and hydrocortinonse 2.5%, desonide, alcometasone 0.05%; Grade 3 or intolerable grade 3, hydrocortisone 2.5%, desonide, alcometasone 0.05%and antibiotics and prednisone 0.5 mg/kg for sever days. If no improvement for Grade 3 in 2 weeks, dose interruption will be required.   3. 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  4. Bone met: limited. S/p palliative XRT. He has been on Zometa every 6 weeks. Zometa will be given with next chemotherapy on 03/02.  5. Anemia: We will follow him symptomatically for transfusion.  6. Smoking: none at this time.  7. Left  chest wall pain, insomnia. Lidocaine patch, fentanyl and hydrocodone prn.  Added decadron 2 mg daily for inflammation and bone mets.  8. Follow up: in 4 weeks on 11/25/2013 with labs and chemotherapy.   All questions were answered. The patient knows to call the clinic with any problems, questions or concerns. We can certainly see the patient much sooner if necessary.  I spent 15 minutes counseling the patient face to face. The total time spent in the appointment was 25 minutes.    Steffany Schoenfelder, MD 11/04/2013 12:44 PM

## 2013-11-04 NOTE — Progress Notes (Signed)
Chaplain made follow-up visit. Pt said he was doing "okay," but was struggling with lots of pain from a tumor wedged between his ribs. Pt stated that he'd outlived his prognosis three times and had a good network of people praying for him. He said that prayer is a major coping skill for him. Pt also shared about his niece's fiance who also has cancer. Pt stated that he is praying for them, but that his niece is reluctant to share anything about the treatment. Chaplain and pt processed through disappointment around this, since he could be a resource and encouragement because of what he's been through himself. Chaplain practiced caring presence and active listening.

## 2013-11-11 ENCOUNTER — Ambulatory Visit (HOSPITAL_BASED_OUTPATIENT_CLINIC_OR_DEPARTMENT_OTHER): Payer: Medicaid Other

## 2013-11-11 ENCOUNTER — Other Ambulatory Visit: Payer: Self-pay | Admitting: Internal Medicine

## 2013-11-11 ENCOUNTER — Other Ambulatory Visit (HOSPITAL_BASED_OUTPATIENT_CLINIC_OR_DEPARTMENT_OTHER): Payer: Medicaid Other

## 2013-11-11 VITALS — BP 138/82 | HR 79 | Temp 98.2°F | Resp 18

## 2013-11-11 DIAGNOSIS — C7952 Secondary malignant neoplasm of bone marrow: Secondary | ICD-10-CM

## 2013-11-11 DIAGNOSIS — C7951 Secondary malignant neoplasm of bone: Secondary | ICD-10-CM

## 2013-11-11 DIAGNOSIS — Z5112 Encounter for antineoplastic immunotherapy: Secondary | ICD-10-CM

## 2013-11-11 DIAGNOSIS — Z5111 Encounter for antineoplastic chemotherapy: Secondary | ICD-10-CM

## 2013-11-11 DIAGNOSIS — C770 Secondary and unspecified malignant neoplasm of lymph nodes of head, face and neck: Secondary | ICD-10-CM

## 2013-11-11 DIAGNOSIS — C7800 Secondary malignant neoplasm of unspecified lung: Secondary | ICD-10-CM

## 2013-11-11 DIAGNOSIS — C029 Malignant neoplasm of tongue, unspecified: Secondary | ICD-10-CM

## 2013-11-11 LAB — CBC WITH DIFFERENTIAL/PLATELET
BASO%: 0.7 % (ref 0.0–2.0)
Basophils Absolute: 0 10*3/uL (ref 0.0–0.1)
EOS ABS: 0.2 10*3/uL (ref 0.0–0.5)
EOS%: 2.9 % (ref 0.0–7.0)
HCT: 29.6 % — ABNORMAL LOW (ref 38.4–49.9)
HGB: 9.8 g/dL — ABNORMAL LOW (ref 13.0–17.1)
LYMPH%: 13.7 % — AB (ref 14.0–49.0)
MCH: 30.6 pg (ref 27.2–33.4)
MCHC: 33 g/dL (ref 32.0–36.0)
MCV: 92.6 fL (ref 79.3–98.0)
MONO#: 0.7 10*3/uL (ref 0.1–0.9)
MONO%: 10.1 % (ref 0.0–14.0)
NEUT#: 5.2 10*3/uL (ref 1.5–6.5)
NEUT%: 72.6 % (ref 39.0–75.0)
PLATELETS: 341 10*3/uL (ref 140–400)
RBC: 3.2 10*6/uL — AB (ref 4.20–5.82)
RDW: 19.2 % — ABNORMAL HIGH (ref 11.0–14.6)
WBC: 7.1 10*3/uL (ref 4.0–10.3)
lymph#: 1 10*3/uL (ref 0.9–3.3)

## 2013-11-11 LAB — BASIC METABOLIC PANEL (CC13)
ANION GAP: 12 meq/L — AB (ref 3–11)
BUN: 9.2 mg/dL (ref 7.0–26.0)
CALCIUM: 10.2 mg/dL (ref 8.4–10.4)
CO2: 28 mEq/L (ref 22–29)
CREATININE: 0.7 mg/dL (ref 0.7–1.3)
Chloride: 101 mEq/L (ref 98–109)
GLUCOSE: 115 mg/dL (ref 70–140)
Potassium: 3.3 mEq/L — ABNORMAL LOW (ref 3.5–5.1)
Sodium: 141 mEq/L (ref 136–145)

## 2013-11-11 MED ORDER — DIPHENHYDRAMINE HCL 50 MG/ML IJ SOLN
50.0000 mg | Freq: Once | INTRAMUSCULAR | Status: AC
  Administered 2013-11-11: 50 mg via INTRAVENOUS

## 2013-11-11 MED ORDER — FAMOTIDINE IN NACL 20-0.9 MG/50ML-% IV SOLN
20.0000 mg | Freq: Once | INTRAVENOUS | Status: AC
  Administered 2013-11-11: 20 mg via INTRAVENOUS

## 2013-11-11 MED ORDER — SODIUM CHLORIDE 0.9 % IV SOLN
48.0000 mg/m2 | Freq: Once | INTRAVENOUS | Status: AC
  Administered 2013-11-11: 90 mg via INTRAVENOUS
  Filled 2013-11-11: qty 15

## 2013-11-11 MED ORDER — CARBOPLATIN CHEMO INTRADERMAL TEST DOSE 100MCG/0.02ML
100.0000 ug | Freq: Once | INTRADERMAL | Status: AC
  Administered 2013-11-11: 100 ug via INTRADERMAL
  Filled 2013-11-11: qty 0.01

## 2013-11-11 MED ORDER — ZOLEDRONIC ACID 4 MG/100ML IV SOLN
4.0000 mg | Freq: Once | INTRAVENOUS | Status: AC
  Administered 2013-11-11: 4 mg via INTRAVENOUS
  Filled 2013-11-11: qty 100

## 2013-11-11 MED ORDER — FAMOTIDINE IN NACL 20-0.9 MG/50ML-% IV SOLN
INTRAVENOUS | Status: AC
  Filled 2013-11-11: qty 50

## 2013-11-11 MED ORDER — SODIUM CHLORIDE 0.9 % IV SOLN
Freq: Once | INTRAVENOUS | Status: AC
  Administered 2013-11-11: 09:00:00 via INTRAVENOUS

## 2013-11-11 MED ORDER — ONDANSETRON 16 MG/50ML IVPB (CHCC)
16.0000 mg | Freq: Once | INTRAVENOUS | Status: AC
  Administered 2013-11-11: 16 mg via INTRAVENOUS

## 2013-11-11 MED ORDER — DIPHENHYDRAMINE HCL 50 MG/ML IJ SOLN
INTRAMUSCULAR | Status: AC
  Filled 2013-11-11: qty 1

## 2013-11-11 MED ORDER — HEPARIN SOD (PORK) LOCK FLUSH 100 UNIT/ML IV SOLN
500.0000 [IU] | Freq: Once | INTRAVENOUS | Status: AC | PRN
  Administered 2013-11-11: 500 [IU]
  Filled 2013-11-11: qty 5

## 2013-11-11 MED ORDER — ONDANSETRON 16 MG/50ML IVPB (CHCC)
INTRAVENOUS | Status: AC
  Filled 2013-11-11: qty 16

## 2013-11-11 MED ORDER — SODIUM CHLORIDE 0.9 % IJ SOLN
10.0000 mL | INTRAMUSCULAR | Status: DC | PRN
  Administered 2013-11-11: 10 mL
  Filled 2013-11-11: qty 10

## 2013-11-11 MED ORDER — DEXAMETHASONE SODIUM PHOSPHATE 20 MG/5ML IJ SOLN
INTRAMUSCULAR | Status: AC
  Filled 2013-11-11: qty 5

## 2013-11-11 MED ORDER — CARBOPLATIN CHEMO INJECTION 450 MG/45ML
180.0000 mg | Freq: Once | INTRAVENOUS | Status: AC
  Administered 2013-11-11: 180 mg via INTRAVENOUS
  Filled 2013-11-11: qty 18

## 2013-11-11 MED ORDER — CETUXIMAB CHEMO IV INJECTION 200 MG/100ML
150.0000 mg/m2 | Freq: Once | INTRAVENOUS | Status: AC
  Administered 2013-11-11: 300 mg via INTRAVENOUS
  Filled 2013-11-11: qty 150

## 2013-11-11 MED ORDER — DEXAMETHASONE SODIUM PHOSPHATE 20 MG/5ML IJ SOLN
20.0000 mg | Freq: Once | INTRAMUSCULAR | Status: AC
  Administered 2013-11-11: 20 mg via INTRAVENOUS

## 2013-11-11 NOTE — Patient Instructions (Signed)
[facility] Discharge Instructions for Patients Receiving Chemotherapy  Today you received the following chemotherapy agents Taxol, Carboplatin, Erbitux and Zometa.  To help prevent nausea and vomiting after your treatment, we encourage you to take your nausea medication.   If you develop nausea and vomiting that is not controlled by your nausea medication, call the clinic.   BELOW ARE SYMPTOMS THAT SHOULD BE REPORTED IMMEDIATELY:  *FEVER GREATER THAN 100.5 F  *CHILLS WITH OR WITHOUT FEVER  NAUSEA AND VOMITING THAT IS NOT CONTROLLED WITH YOUR NAUSEA MEDICATION  *UNUSUAL SHORTNESS OF BREATH  *UNUSUAL BRUISING OR BLEEDING  TENDERNESS IN MOUTH AND THROAT WITH OR WITHOUT PRESENCE OF ULCERS  *URINARY PROBLEMS  *BOWEL PROBLEMS  UNUSUAL RASH Items with * indicate a potential emergency and should be followed up as soon as possible.  Feel free to call the clinic you have any questions or concerns. The clinic phone number is [phone].

## 2013-11-12 ENCOUNTER — Encounter: Payer: Self-pay | Admitting: Internal Medicine

## 2013-11-12 ENCOUNTER — Telehealth: Payer: Self-pay | Admitting: Internal Medicine

## 2013-11-12 ENCOUNTER — Other Ambulatory Visit: Payer: Self-pay | Admitting: Internal Medicine

## 2013-11-12 DIAGNOSIS — C7951 Secondary malignant neoplasm of bone: Secondary | ICD-10-CM

## 2013-11-12 DIAGNOSIS — C029 Malignant neoplasm of tongue, unspecified: Secondary | ICD-10-CM

## 2013-11-12 MED ORDER — HYDROMORPHONE HCL 2 MG PO TABS: 2.0000 mg | ORAL_TABLET | ORAL | Status: DC | PRN

## 2013-11-12 NOTE — Telephone Encounter (Signed)
Patient complains of worsening pain to left chest wall.  We provided him a prescription of dilaudid 2 mg every 4 hours as needed (#90).  He is aware of the constipation and sedating side-effects.  He is aware to take fall precautions.  We also recommended to check CT of C/A/P for evaluation of his disease.  He is agreeable with this plan and will pick up his pain prescription.  He tolerated dilaudid well in the hospital.

## 2013-11-13 ENCOUNTER — Encounter: Payer: Self-pay | Admitting: Internal Medicine

## 2013-11-13 ENCOUNTER — Telehealth: Payer: Self-pay | Admitting: *Deleted

## 2013-11-13 NOTE — Telephone Encounter (Signed)
Spoke with pt and informed pt re:  Per Dr. Juliann Mule, pt to take Dilaudid for breakthrough pain only.  Pt to stop Hydrocodone as per md.  Pt voiced understanding.

## 2013-11-13 NOTE — Telephone Encounter (Signed)
Reviewed pt's email with question about Dilaudid med.  Spoke with pt and informed pt that a prescription for Dilaudid is ready for pt to pick up.  Pt also inquired about Hydrocodone - does Dr. Juliann Mule want pt to continue taking this med as needed.  If so, pt will need a refill prescription.  Spoke with Shirlean Mylar, Chief Strategy Officer.  Robin to clarify with Dr. Juliann Mule and will contact pt with md's instructions.

## 2013-11-18 ENCOUNTER — Telehealth: Payer: Self-pay | Admitting: Medical Oncology

## 2013-11-18 ENCOUNTER — Ambulatory Visit (HOSPITAL_COMMUNITY)
Admission: RE | Admit: 2013-11-18 | Discharge: 2013-11-18 | Disposition: A | Discharge status: Home / Self Care | Payer: Medicaid Other | Source: Ambulatory Visit | Attending: Internal Medicine | Admitting: Internal Medicine

## 2013-11-18 ENCOUNTER — Encounter (HOSPITAL_COMMUNITY): Payer: Self-pay

## 2013-11-18 DIAGNOSIS — C7951 Secondary malignant neoplasm of bone: Secondary | ICD-10-CM | POA: Insufficient documentation

## 2013-11-18 DIAGNOSIS — C029 Malignant neoplasm of tongue, unspecified: Secondary | ICD-10-CM | POA: Insufficient documentation

## 2013-11-18 DIAGNOSIS — C7800 Secondary malignant neoplasm of unspecified lung: Secondary | ICD-10-CM | POA: Insufficient documentation

## 2013-11-18 DIAGNOSIS — K8020 Calculus of gallbladder without cholecystitis without obstruction: Secondary | ICD-10-CM | POA: Insufficient documentation

## 2013-11-18 DIAGNOSIS — C7952 Secondary malignant neoplasm of bone marrow: Secondary | ICD-10-CM

## 2013-11-18 MED ORDER — IOHEXOL 300 MG/ML  SOLN
100.0000 mL | Freq: Once | INTRAMUSCULAR | Status: AC | PRN
  Administered 2013-11-18: 100 mL via INTRAVENOUS

## 2013-11-18 NOTE — Telephone Encounter (Signed)
Pt called and states that he is having a lot of pain. He states the dilaudid 2 mg helps but in about an hour he needs something else. He has taken the hydrocodone with the dilaudid over the weekend. He states that Dr. Juliann Mule told him he could take 2 of the 2 mg dilaudid but he has had trouble with his pain medication refills in the past and he does not want to be without. I explained that we can call the pharmacy if there is a problem with refilling his medication. I spoke with Dr. Juliann Mule and he would like for him to take (2) 2 mg dilaudid tabs = 4 mg every 4 hours for breath thru. I asked him to call me and let me know how this is working. If he gets too sedated he can take one tablet. He states he does not get sedated from the dilaudid like he does the hydrocodone. He is have a CT today at 2:45pm. He would like for me to notify Dr. Juliann Mule he has had a couple more episodes of coughing up blood.He had another last Thurs and one yesterday. It is bright red and is soaks a tissue. I will discuss with Dr. Juliann Mule and call him back. He voiced understanding of how to take his pain medication.

## 2013-11-20 ENCOUNTER — Telehealth: Payer: Self-pay | Admitting: Medical Oncology

## 2013-11-20 ENCOUNTER — Telehealth: Payer: Self-pay | Admitting: Internal Medicine

## 2013-11-20 ENCOUNTER — Encounter: Payer: Self-pay | Admitting: Internal Medicine

## 2013-11-20 NOTE — Telephone Encounter (Signed)
Patient was wondering about the results of his recent scans.  I provided him an overview of his results which included mixed response to therapy.  His main Lung mass increased affecting the chest wall was slightly decreased but other associated area consistent with disease progression was noted favoring overall progressive disease. We will have him back in the office sooner to review more in depth.  His pain is moderately controlled with the fentanyl 75 mcg/hr every 3 days patch plus the dilaudid 4 mg q 3-4 hours prn.  He does report some hemoptysis.  I recommended if this persists that he be evaluated by the ER.  We are considering methotrexate IV for his next treatment option.

## 2013-11-25 ENCOUNTER — Other Ambulatory Visit: Payer: Self-pay

## 2013-11-25 ENCOUNTER — Ambulatory Visit: Payer: Self-pay

## 2013-11-25 DIAGNOSIS — C7952 Secondary malignant neoplasm of bone marrow: Principal | ICD-10-CM

## 2013-11-25 DIAGNOSIS — C7951 Secondary malignant neoplasm of bone: Secondary | ICD-10-CM

## 2013-11-25 MED ORDER — HYDROMORPHONE HCL 4 MG PO TABS: 4.0000 mg | ORAL_TABLET | ORAL | Status: DC | PRN

## 2013-11-26 ENCOUNTER — Ambulatory Visit: Payer: Self-pay

## 2013-11-26 ENCOUNTER — Ambulatory Visit (HOSPITAL_BASED_OUTPATIENT_CLINIC_OR_DEPARTMENT_OTHER): Payer: Medicaid Other | Admitting: Internal Medicine

## 2013-11-26 ENCOUNTER — Other Ambulatory Visit (HOSPITAL_BASED_OUTPATIENT_CLINIC_OR_DEPARTMENT_OTHER): Payer: Medicaid Other

## 2013-11-26 ENCOUNTER — Encounter: Payer: Self-pay | Admitting: Internal Medicine

## 2013-11-26 VITALS — BP 115/72 | HR 121 | Temp 98.9°F | Resp 18 | Ht 72.0 in | Wt 173.4 lb

## 2013-11-26 DIAGNOSIS — C7951 Secondary malignant neoplasm of bone: Secondary | ICD-10-CM

## 2013-11-26 DIAGNOSIS — C7952 Secondary malignant neoplasm of bone marrow: Secondary | ICD-10-CM

## 2013-11-26 DIAGNOSIS — R071 Chest pain on breathing: Secondary | ICD-10-CM

## 2013-11-26 DIAGNOSIS — Z923 Personal history of irradiation: Secondary | ICD-10-CM

## 2013-11-26 DIAGNOSIS — C50919 Malignant neoplasm of unspecified site of unspecified female breast: Secondary | ICD-10-CM

## 2013-11-26 DIAGNOSIS — D649 Anemia, unspecified: Secondary | ICD-10-CM

## 2013-11-26 DIAGNOSIS — C029 Malignant neoplasm of tongue, unspecified: Secondary | ICD-10-CM

## 2013-11-26 DIAGNOSIS — F419 Anxiety disorder, unspecified: Secondary | ICD-10-CM

## 2013-11-26 DIAGNOSIS — F411 Generalized anxiety disorder: Secondary | ICD-10-CM

## 2013-11-26 DIAGNOSIS — G4700 Insomnia, unspecified: Secondary | ICD-10-CM

## 2013-11-26 DIAGNOSIS — C779 Secondary and unspecified malignant neoplasm of lymph node, unspecified: Secondary | ICD-10-CM

## 2013-11-26 DIAGNOSIS — C7800 Secondary malignant neoplasm of unspecified lung: Secondary | ICD-10-CM

## 2013-11-26 LAB — CBC WITH DIFFERENTIAL/PLATELET
BASO%: 0.6 % (ref 0.0–2.0)
BASOS ABS: 0 10*3/uL (ref 0.0–0.1)
EOS ABS: 0.4 10*3/uL (ref 0.0–0.5)
EOS%: 4.8 % (ref 0.0–7.0)
HCT: 27.8 % — ABNORMAL LOW (ref 38.4–49.9)
HEMOGLOBIN: 9.1 g/dL — AB (ref 13.0–17.1)
LYMPH%: 7.7 % — AB (ref 14.0–49.0)
MCH: 30.3 pg (ref 27.2–33.4)
MCHC: 32.8 g/dL (ref 32.0–36.0)
MCV: 92.4 fL (ref 79.3–98.0)
MONO#: 0.5 10*3/uL (ref 0.1–0.9)
MONO%: 6.8 % (ref 0.0–14.0)
NEUT#: 6 10*3/uL (ref 1.5–6.5)
NEUT%: 80.1 % — ABNORMAL HIGH (ref 39.0–75.0)
Platelets: 323 10*3/uL (ref 140–400)
RBC: 3 10*6/uL — AB (ref 4.20–5.82)
RDW: 19 % — AB (ref 11.0–14.6)
WBC: 7.4 10*3/uL (ref 4.0–10.3)
lymph#: 0.6 10*3/uL — ABNORMAL LOW (ref 0.9–3.3)

## 2013-11-26 LAB — COMPREHENSIVE METABOLIC PANEL (CC13)
ALBUMIN: 3 g/dL — AB (ref 3.5–5.0)
ALT: 17 U/L (ref 0–55)
ANION GAP: 13 meq/L — AB (ref 3–11)
AST: 16 U/L (ref 5–34)
Alkaline Phosphatase: 69 U/L (ref 40–150)
BUN: 11.9 mg/dL (ref 7.0–26.0)
CO2: 28 meq/L (ref 22–29)
Calcium: 9.8 mg/dL (ref 8.4–10.4)
Chloride: 98 mEq/L (ref 98–109)
Creatinine: 0.7 mg/dL (ref 0.7–1.3)
GLUCOSE: 141 mg/dL — AB (ref 70–140)
POTASSIUM: 4 meq/L (ref 3.5–5.1)
Sodium: 139 mEq/L (ref 136–145)
Total Bilirubin: 0.36 mg/dL (ref 0.20–1.20)
Total Protein: 7.5 g/dL (ref 6.4–8.3)

## 2013-11-26 MED ORDER — LORAZEPAM 0.5 MG PO TABS: 0.5000 mg | ORAL_TABLET | Freq: Four times a day (QID) | ORAL | Status: DC | PRN

## 2013-11-27 ENCOUNTER — Telehealth: Payer: Self-pay | Admitting: *Deleted

## 2013-11-27 NOTE — Telephone Encounter (Signed)
Pt's father Masson Nalepa wanted to talk to triage nurse as a walk in.  Spoke with Vern and was informed that father would like a referral letter to hospice for pt.  Asked Vern if this is what pt wanted, father stated " No ".  Pt saw Dr. Juliann Mule on 11/26/13.  Per Vern, pt to start new chemo treatment on 12/02/13.  Explained to [place] that pt would not qualify for hospice as long as pt is still under active chemo treatments.  Informed Vern that if and when pt is ready for hospice, pt will need to let Dr. Juliann Mule know for a referral.  Vern voiced understanding. Dr. Juliann Mule notified of above info.

## 2013-11-28 NOTE — Progress Notes (Signed)
[name], [facility]  DIAGNOSIS: Squamous cell carcinoma of tongue - Plan: fentaNYL (DURAGESIC - DOSED MCG/HR) 50 MCG/HR, CBC with Differential, Comprehensive metabolic panel (Cmet) - [facility], Lactate dehydrogenase (LDH) - [facility], CBC with Differential, Comprehensive metabolic panel (Cmet) - [facility], Lactate dehydrogenase (LDH) - [facility], LORazepam (ATIVAN) 0.5 MG tablet, DISCONTINUED: LORazepam (ATIVAN) 0.5 MG tablet  Anxiety - Plan: fentaNYL (DURAGESIC - DOSED MCG/HR) 50 MCG/HR, CBC with Differential, Comprehensive metabolic panel (Cmet) - [facility], Lactate dehydrogenase (LDH) - [facility], CBC with Differential, Comprehensive metabolic panel (Cmet) - [facility], Lactate dehydrogenase (LDH) - [facility], LORazepam (ATIVAN) 0.5 MG tablet, DISCONTINUED: LORazepam (ATIVAN) 0.5 MG tablet  Bone metastasis  Hx of radiation therapy  Chief Complaint  Patient presents with  . Squamous cell carcinoma of tongue    CURRENT TREATMENT: Started on 05/15/2012 palliative chemo weekly Carboplatin/Taxol/Erbitux; 3 weeks on; 1 week off. Due to prolonged cytopenia, his chemo had been changed to weekly; 2 weeks on, 1 week off. He is on zometa q 4-6 weeks.     Squamous cell carcinoma of tongue   01/03/2011 Initial Diagnosis Squamous cell carcinoma of tongue   01/03/2011 Surgery Squamous cell carcinoma of the left lateral tongue s/p excision.    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 (Dr. Lucia Bean and Dr. Ernesto Bean)    07/27/2011 Pathology A  total 15 LN from left radical neck dissection that was all (-); the left submandibular gland mass showed a 3.5 cm invasive SCC, moderately differentiated involving the adjacent skeletal muscle tissue and bone extending margin; No invasion lymph or per   09/29/2011 Surgery He underwent further resection by Dr.  Caryl [facility] at [facility]   09/29/2011 Pathology Mandible w L neck tissue, parital mandibulectomy: Inv SCC, Tumor size 2.6 cm; moderately to poorly differentiated (grade 3); mandibular bone invovled, extending to skeletal muscle. + perineural invasion; Neg lymphvascular Invasion. Pos margins.    11/15/2011 Concurrent Chemotherapy He received adj chemoradiation w q 3 wk cisplatin (3/5 - 12/05/11). Receive 2 of 3 doses, 3rd dose held due to febrile neutropenia, flap abscess.     11/15/2011 - 01/05/2012 Radiation Therapy He received concurrent XRT.    03/12/2012 Relapse/Recurrence He had right neck recurrence.   03/12/2012 Surgery He underwent a palliative right neck dissection by Dr. Owens Shark at [facility]    03/27/2012 Pathology Invasive moderately to poorly differentiate SCC w tumor invovling skeletal muscle.  Extensive perineural invasion identified. No LVI. No maligancy in 18 LN.    03/29/2012 Imaging CT Chest at [facility].  18 x 18 mm LUL lung nodule suspicious for metastases.   04/02/2012 Procedure LLL biopsy.    04/02/2012 Pathology LLL biopsy c/w SCC.    04/25/2012 Cancer Staging PET/CT: 1.  Response to therapy within the head neck,  withotu evidence of residual hypermetabolic disease. Superior segment LLL cavitary hypermetabolic lung nodule.  This could represent an isolated metastasis ormetachronous primary.  L iliac bone met   04/25/2012 Progression Increased in size of lytic lesion in the posterior aspect of the L iliac bone.    05/15/2012 - 07/01/2013 Chemotherapy Started on palliative chemo weekly with carboplatin/taxol/erbitux (3 weeks on and 1 week off).  Due to prolonged cytopenia, his chemo was changed to weekly (2 weeks on, 1 weeek off)   07/05/2012 Imaging CT CAP: Interval decrease in size of left lower lobe cavitary massfollowing therapy.  Lytic lesion in the posterior aspect of the left iliac bone is increased size compared to prior.  This was hypermetabolic on comparison PET CT scan.   07/16/2012 -  Chemotherapy  Zometa 4 mg IV every 4 weeks started given evidence of bone metastases.   08/25/2012 Procedure IR Gastrostomy Tube placed.    10/29/2012 Imaging CT CAP. 2.3 cm cavitary lesion in the left lower lobe, decreased. Mild nodularity along the left fissure, mildly increased. Attention on follow-up suggested.    03/01/2013 Imaging CT C/A/P. 1.  Enlargement of dominant cavitary left lower lobe lesion.  This could reflect metastatic squamous cell carcinoma or by a primary lung malignancy. 2.  No other evidence of thoracic metastatic disease.   05/16/2013 Imaging CT CAP: 4.3 x 3.2 cm cavitary lesion/metastasis in the superior segment left lower lobe, mildly increased. No evidence of disease in abdomen or pelvis.     07/17/2013 - 07/30/2013 Radiation Therapy Palliative XRT to the left chest wall 30 gy per fraction x 10 fractions    08/22/2013 Imaging CT CAP: 1. Interval expansion of pleural-based lesion in the LLL with new this adjacent parenchymal nodules is most c/w progression of metastatic carcinoma. 2. New right infrahilar low-density lymph node is concerning for new nodal mets   08/22/2013 Progression LLL pleural-based lesion increased in size.    09/02/2013 -  Chemotherapy Restarted on palliative chemo weekly with carboplatin/taxol/erbitux (2 weeks on, 1 weeek off). No active clinical trials at [facility] (Dr. Marcille [facility]).  if further progression, we will try MTX.     INTERVAL HISTORY: [facility] 53 y.o. male returns for regular follow up visit. He was seen by me on 11/04/2013. His pain medications have been increased from fentanyl 50 mcg/hr to 75 mcg/hr due to worsening left chest pain.His other pain medications include his gabapentin and dilaudid 4 mg prn. He was started on decadron 2 mg bid last visit with near resolution in his rash.  Today, he is accompanied by his father. He had interim restaging CT of chest/abdomen and pelvis consistent with overall progression.   Otherwise, he continues to have a PEG  with osmolite 1.5 at 60 ml/hr for 12 hours nocturnal continuosly feeding daily. The patient denied fever, chills, night sweats, change in appetite or weight. He denied headaches, double vision, blurry vision, nasal congestion, nasal discharge, odynophagia or dysphagia. No palpitations, dyspnea, abdominal pain, vomiting, diarrhea, hematochezia. The patient denied dysuria, nocturia, polyuria, hematuria, myalgia, tingling. He does endorse occasional hemoptysis.   MEDICAL HISTORY: Past Medical History  Diagnosis Date  . Hemorrhoids   . Hypertension     no medications at present  . GERD (gastroesophageal reflux disease)     no medication  . Depression     treated approx 12 years ago, no treatment at this time  . Hiatal hernia   . Lesion of left lung 03/14/12    CT chest, [facility], squamous cell carcinoma  . Facial rash 05/24/2012  . Tongue cancer 12/2010    left side of tongue T1 lesion excised 4/12  . Lung metastasis dx'd 03/2012  . Cancer 08/2011    oral tongue  . Hx of radiation therapy 07/24/12- 07-26-12    left iliac bone, 18 Gy 3 sessions  . Hx of radiation therapy 11/15/11- 01/05/12    left neck, oral cavity6600 cGy 33 sessions  . Bone metastasis 04/25/12    PET scan-left iliac bone  . Hx antineoplastic chemotherapy 2013    Carboplatin/Taxol/Erbitux  . S/P radiation  therapy 07/17/2013-07/30/2013     Left chest was treated to a total dose of 30 gray in 10 fractions at 3 gray per fraction    INTERIM HISTORY: has DEPRESSION; HYPERTENSION; HEMORRHOIDS; DIVERTICULOSIS, COLON; IRRITABLE BOWEL SYNDROME; Squamous cell carcinoma of tongue; Anxiety; Cancer of head, face, or neck lymph nodes, secondary; Abscess; Mucositis; Hx of radiation therapy; Lesion of left lung; Bone metastasis; and Cancer related pain on his problem list.    ALLERGIES:  is allergic to codeine phosphate; morphine and related; and tape.  MEDICATIONS: has a current medication list which includes the following prescription(s):  clindamycin, dexamethasone, fentanyl, gabapentin, hydromorphone, lidocaine, lidocaine-prilocaine, lorazepam, feeding supplement (osmolite 1.5 cal), omeprazole, ondansetron, polyethylene glycol, prochlorperazine, and sertraline.  SURGICAL HISTORY:  Past Surgical History  Procedure Laterality Date  . Inguinal herniorrhapy    . Tonsillectomy and adenoidectomy    . Excision of tongue mass      April 2012  . Hernia repair    . Neck mass excision  07/27/2011  . Radical neck dissection  07/27/2011    Procedure: RADICAL NECK DISSECTION;  Surgeon: Rozetta Nunnery, [facility];  Location: [facility];  Service: ENT;  Laterality: Left;  Marland Kitchen Mandibulectomy  09/2011    and scapular free flap  . Modified radical neck dissection  03/27/12    excision of lip lesion, lip benign, right neck squamous cell ca    REVIEW OF SYSTEMS:   Constitutional: Denies fevers, chills or abnormal weight loss, 6 out of 10 pain to left rib cage, neck  Eyes: Denies blurriness of vision Ears, nose, mouth, throat, and face: Denies mucositis or sore throat Respiratory: Denies cough, dyspnea or wheezes Cardiovascular: Denies palpitation, chest discomfort or lower extremity swelling Gastrointestinal:  Denies nausea, heartburn or change in bowel habits; positive for trouble swallowing but improves with post-prandial hydration.  Skin: Reports pustular rash on face, back and chest has improved on steroids Lymphatics: Denies new lymphadenopathy or easy bruising Neurological:Denies numbness, tingling or new weaknesses Behavioral/Psych: Mood is stable, no new changes  All other systems were reviewed with the patient and are negative.  PHYSICAL EXAMINATION: ECOG PERFORMANCE STATUS: 1 - Symptomatic but completely ambulatory  Blood pressure 115/72, pulse 121, temperature 98.9 F (37.2 C), temperature source Oral, resp. rate 18, height 6' (1.829 m), weight 173 lb 6.4 oz (78.654 kg).  General appearance: alert, cooperative, appears stated age  and no distress  Head: Normocephalic, without obvious abnormality, atraumatic  HEENT: Sclerae anicteric. Conjuncitvae pink. PERRLA; Oral mucosa moist without ulceration or thrush.  Neck: + cervical adenopathy about 1 cm rubbery and non-tender, supple, symmetrical, trachea midline and thyroid not enlarged, symmetric, no tenderness/mass/nodules. No submandibular adenopathy. S/p major neck surgery with neck scars well healed.  Lymph nodes: Cervical adenopathy: as noted above. Supraclavicular adenopathy: None appreciated; No  Heart:regular rate and rhythm, S1, S2 normal, no murmur, click, rub or gallop  Lung:chest clear, no wheezing, rales, normal symmetric air entry, Heart exam - S1, S2 normal, no murmur, no gallop, rate regular  Abdomin: soft, non-tender, without masses or organomegaly + G tube without signs of infections.  EXT: No peripheral edema; positive tenderness over left scapular, left chest wall.  Skin: Rash on face, back, chest present since restarting Erbitux with near resolution.  + Left Chest port-a-cath without tenderness or fluctuance    Labs:  Lab Results  Component Value Date   WBC 7.4 11/26/2013   HGB 9.1* 11/26/2013   HCT 27.8* 11/26/2013   MCV 92.4 11/26/2013   PLT  323 11/26/2013   NEUTROABS 6.0 11/26/2013      Chemistry      Component Value Date/Time   NA 139 11/26/2013 0830   NA 138 06/25/2012 0858   K 4.0 11/26/2013 0830   K 3.1* 06/25/2012 0858   CL 102 02/25/2013 0753   CL 99 06/25/2012 0858   CO2 28 11/26/2013 0830   CO2 30 06/25/2012 0858   BUN 11.9 11/26/2013 0830   BUN 10 06/25/2012 0858   CREATININE 0.7 11/26/2013 0830   CREATININE 0.47* 06/25/2012 0858      Component Value Date/Time   CALCIUM 9.8 11/26/2013 0830   CALCIUM 9.6 06/25/2012 0858   ALKPHOS 69 11/26/2013 0830   ALKPHOS 54 05/03/2012 0905   AST 16 11/26/2013 0830   AST 14 05/03/2012 0905   ALT 17 11/26/2013 0830   ALT 11 05/03/2012 0905   BILITOT 0.36 11/26/2013 0830   BILITOT 0.3 05/03/2012 0905        Basic Metabolic Panel:  Recent Labs Lab 11/26/13 0830  NA 139  K 4.0  CO2 28  GLUCOSE 141*  BUN 11.9  CREATININE 0.7  CALCIUM 9.8   GFR Estimated Creatinine Clearance: 115.9 ml/min (by C-G formula based on Cr of 0.7). Liver Function Tests:  Recent Labs Lab 11/26/13 0830  AST 16  ALT 17  ALKPHOS 69  BILITOT 0.36  PROT 7.5  ALBUMIN 3.0*   CBC:  Recent Labs Lab 11/26/13 0828  WBC 7.4  NEUTROABS 6.0  HGB 9.1*  HCT 27.8*  MCV 92.4  PLT 323    Studies:  11/18/2013  CT CHEST, ABDOMEN, AND PELVIS WITH CONTRAST  TECHNIQUE:  Multidetector CT imaging of the chest, abdomen and pelvis was  performed following the standard protocol during bolus  administration of intravenous contrast.  CONTRAST: OMNIPAQUE IOHEXOL 300 MG/ML SOLN  COMPARISON: CT CHEST W/CM dated 08/22/2013; CT ABD/PELVIS W CM  dated 08/22/2013; CT CHEST W/CM dated 05/16/2013; CT CHEST W/CM dated  07/05/2012  FINDINGS:  CT CHEST FINDINGS  Lungs/Pleura: Mass effect upon the left lower lobe bronchus and  posterior aspect of the left mainstem bronchus on images 26 and 27.  These remain patent. This is new.  Paraseptal emphysema. Pleural-based superior segment left lower lobe  lesion measures 4.5 x 3.7 cm on image 23 versus 5.1 x 3.8 cm at the  same level on the prior. Slightly less masslike today, somewhat  wedge shaped. Immediately subjacent airspace disease with air  bronchograms on image 29 could be related to the radiation therapy.  The surrounding satellite nodules are no longer identified  anteriorly and posteriorly.  Direct extension of tumor and contiguous adenopathy into the more  central portion of the superior segment left lower lobe, left  infrahilar region, and left-sided mediastinum. Example image  25/series 4. This area measures 3.2 x 3.5 cm on image 25/series 2  and is contiguous with a 1.1 x 1.7 cm left mediastinal node on the  prior exam.  No pleural fluid.   Heart/Mediastinum: No supraclavicular adenopathy. A left-sided  Port-A-Cath which terminates at the high right atrium. Heart size  upper normal, without pericardial effusion. No central pulmonary  embolism, on this non-dedicated study. No right-sided mediastinal or  hilar adenopathy.  CT ABDOMEN AND PELVIS FINDINGS  Abdomen/Pelvis: Normal liver. Multi septated calcified splenic  lesion is similar on the order of 5.0 cm.  Gastrostomy appropriately positioned.  Normal pancreas, adrenal glands. Cholelithiasis without acute  cholecystitis or biliary ductal dilatation.  Right  renal cysts. No retroperitoneal or retrocrural adenopathy.  Colonic stool burden suggests constipation. Cecum positioned within  the pelvis. Normal terminal ileum and appendix. Normal small bowel  without abdominal ascites. No pelvic adenopathy. Normal urinary  bladder and prostate. No significant free fluid.  Bones/Musculoskeletal: No focal osseous lesion.  IMPRESSION:  CT CHEST IMPRESSION  1. Mixed response to therapy within the chest. Overall progression.  2. The left upper lobe peripheral mass is slightly decreased in  size. However, there is direct extension medially with contiguous  left mediastinal and infrahilar tumor/adenopathy.  CT ABDOMEN AND PELVIS IMPRESSION  1. No evidence of metastatic disease within the abdomen or pelvis.  2. Osseous metastasis detailed on prior PET within the left iliac  are not readily CT apparent.  3. Multiseptated calcified splenic lesion is similar to on the prior  exam and likely benign. Favor the sequelae of prior infection or  hemorrhage.  4. Possible constipation.  5. Cholelithiasis.   RADIOGRAPHIC STUDIES: No results found.  ASSESSMENT: [facility] 53 y.o. male with a history of Squamous cell carcinoma of tongue - Plan: fentaNYL (DURAGESIC - DOSED MCG/HR) 50 MCG/HR, CBC with Differential, Comprehensive metabolic panel (Cmet) - [facility], Lactate dehydrogenase (LDH)  - [facility], CBC with Differential, Comprehensive metabolic panel (Cmet) - [facility], Lactate dehydrogenase (LDH) - [facility], LORazepam (ATIVAN) 0.5 MG tablet, DISCONTINUED: LORazepam (ATIVAN) 0.5 MG tablet  Anxiety - Plan: fentaNYL (DURAGESIC - DOSED MCG/HR) 50 MCG/HR, CBC with Differential, Comprehensive metabolic panel (Cmet) - [facility], Lactate dehydrogenase (LDH) - [facility], CBC with Differential, Comprehensive metabolic panel (Cmet) - [facility], Lactate dehydrogenase (LDH) - [facility], LORazepam (ATIVAN) 0.5 MG tablet, DISCONTINUED: LORazepam (ATIVAN) 0.5 MG tablet  Bone metastasis  Hx of radiation therapy   PLAN:  1. Recurrent left lateral tongue squamous cell carcinoma: He now has metastatic disease to the lung and bone and left chest wall.  --He complains of more pain to left chest wall concerning for progressive disease. This is verified by his restaging CT CAP on 11/18/2013.   --He has been seen by [facility] (Dr. Marcille [facility]) with plans for clinical trial enrollment. They reviewed his [id number] clinical sequencing without identifiable actionablelesions. We discussed his treatment options. Given his other disease progression to the other sites, we will transition to a new therapy. This would include intravenous methotrexate 40 mg/m2 weekly. The overall response rate to methotrexate was 4 percent in 152 patients treated with methotrexate, and the median overall survival was seven months.  Lorenso Courier, JCO. 2009;27(11):1864.   If more progression, we will administer gemcitabine or perhaps pemetrexed.    --We provided a detail review including indication and benefits of intravenous methrotrexate therapy for palliation of his chest wall mass and improvement in other symptoms and QOL measures based on the above reference.   He is aware that his disease is not curable.  In addition we provided an side-effect list of methotrexate including but not limited to the following:  LFTs elevated, nausea/vomiting, malaise, fatigue, abdominal  discomfort, rigors, fevers, dizziness, diarrhea, anemia, thrombocytopenia, leukopenia, rash, alopecia, or severe ulcerative stomatis, nephrotoxicity, opportunistic infection, neurotoxicity, severe skin rashes which can be fatal.  Patient voiced understanding the treatment options and agreed to proceed with intravenous methotrexatel. Handouts and detailed risk and benefits and indications have been provided .  We plan to restage in 3 months.  We will start IV MTX 40 mg/m2 weekly on 03/23.    2. Skin rash secondary to Erbitux, nearly resolved. Cetuximab-induced skin rash may represent a prognostic  factor in patients with advanced solid tumors. For Grade 0, we will continue prophyloactic therapy with sunscreen SPF greater than or equal to 15; Grade 1, Hydrocortisone 2.5% cream and clindamycin 1 % gel with bi-weekly assessments; Grade 2, antibiotics (keflex) and hydrocortinonse 2.5%, desonide, alcometasone 0.05%; Grade 3 or intolerable grade 3, hydrocortisone 2.5%, desonide, alcometasone 0.05%and antibiotics and prednisone 0.5 mg/kg for sever days. If no improvement for Grade 3 in 2 weeks, dose interruption will be required.   3. 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   4. Bone met: limited. S/p palliative XRT. He has been on Zometa every 6 weeks. Zometa will be given with next chemotherapy on 03/23.   5. Anemia: We will follow him symptomatically for transfusion.  6. Smoking: none at this time.  7. Left chest wall pain, insomnia. Lidocaine patch, fentanyl and hydrocodone prn.  Added decadron 2 mg daily for inflammation and bone mets.  8. Follow up: in 3 weeks with labs and chemotherapy.   All questions were answered. The patient knows to call the clinic with any problems, questions or concerns. We can certainly see the patient much sooner if necessary.  I spent 25 minutes counseling the patient face to face. The total time spent in the appointment was 40  minutes.    Hebe Merriwether, [facility] 11/28/2013 7:00 AM

## 2013-11-29 ENCOUNTER — Telehealth: Payer: Self-pay | Admitting: Internal Medicine

## 2013-11-29 NOTE — Telephone Encounter (Signed)
added additonal wkly inf appts. per Dr. Juliann Mule tx will be weekly. s/w pt he is aware and will check mychart and get new schedule when he comes in 3/23.

## 2013-12-02 ENCOUNTER — Emergency Department (HOSPITAL_COMMUNITY): Payer: Medicaid Other

## 2013-12-02 ENCOUNTER — Other Ambulatory Visit: Payer: Self-pay

## 2013-12-02 ENCOUNTER — Encounter (HOSPITAL_COMMUNITY): Payer: Self-pay | Admitting: Emergency Medicine

## 2013-12-02 ENCOUNTER — Inpatient Hospital Stay (HOSPITAL_COMMUNITY)
Admission: EM | Admit: 2013-12-02 | Discharge: 2013-12-06 | DRG: 193 | Disposition: A | Discharge status: Hospice | Payer: Medicaid Other | Attending: Internal Medicine | Admitting: Internal Medicine

## 2013-12-02 ENCOUNTER — Ambulatory Visit: Payer: Self-pay

## 2013-12-02 ENCOUNTER — Telehealth: Payer: Self-pay | Admitting: Medical Oncology

## 2013-12-02 DIAGNOSIS — I10 Essential (primary) hypertension: Secondary | ICD-10-CM | POA: Diagnosis present

## 2013-12-02 DIAGNOSIS — A419 Sepsis, unspecified organism: Secondary | ICD-10-CM | POA: Diagnosis present

## 2013-12-02 DIAGNOSIS — C770 Secondary and unspecified malignant neoplasm of lymph nodes of head, face and neck: Secondary | ICD-10-CM

## 2013-12-02 DIAGNOSIS — F3289 Other specified depressive episodes: Secondary | ICD-10-CM

## 2013-12-02 DIAGNOSIS — D6481 Anemia due to antineoplastic chemotherapy: Secondary | ICD-10-CM | POA: Diagnosis present

## 2013-12-02 DIAGNOSIS — K1230 Oral mucositis (ulcerative), unspecified: Secondary | ICD-10-CM

## 2013-12-02 DIAGNOSIS — C7951 Secondary malignant neoplasm of bone: Secondary | ICD-10-CM | POA: Diagnosis present

## 2013-12-02 DIAGNOSIS — K219 Gastro-esophageal reflux disease without esophagitis: Secondary | ICD-10-CM | POA: Diagnosis present

## 2013-12-02 DIAGNOSIS — G4700 Insomnia, unspecified: Secondary | ICD-10-CM | POA: Diagnosis present

## 2013-12-02 DIAGNOSIS — Z87891 Personal history of nicotine dependence: Secondary | ICD-10-CM

## 2013-12-02 DIAGNOSIS — J988 Other specified respiratory disorders: Secondary | ICD-10-CM

## 2013-12-02 DIAGNOSIS — K5730 Diverticulosis of large intestine without perforation or abscess without bleeding: Secondary | ICD-10-CM

## 2013-12-02 DIAGNOSIS — R911 Solitary pulmonary nodule: Secondary | ICD-10-CM

## 2013-12-02 DIAGNOSIS — R6510 Systemic inflammatory response syndrome (SIRS) of non-infectious origin without acute organ dysfunction: Secondary | ICD-10-CM

## 2013-12-02 DIAGNOSIS — C029 Malignant neoplasm of tongue, unspecified: Secondary | ICD-10-CM | POA: Diagnosis present

## 2013-12-02 DIAGNOSIS — F419 Anxiety disorder, unspecified: Secondary | ICD-10-CM | POA: Diagnosis present

## 2013-12-02 DIAGNOSIS — R7881 Bacteremia: Secondary | ICD-10-CM

## 2013-12-02 DIAGNOSIS — T451X5A Adverse effect of antineoplastic and immunosuppressive drugs, initial encounter: Secondary | ICD-10-CM | POA: Diagnosis present

## 2013-12-02 DIAGNOSIS — L0291 Cutaneous abscess, unspecified: Secondary | ICD-10-CM

## 2013-12-02 DIAGNOSIS — F329 Major depressive disorder, single episode, unspecified: Secondary | ICD-10-CM | POA: Diagnosis present

## 2013-12-02 DIAGNOSIS — Z885 Allergy status to narcotic agent status: Secondary | ICD-10-CM

## 2013-12-02 DIAGNOSIS — Z85828 Personal history of other malignant neoplasm of skin: Secondary | ICD-10-CM

## 2013-12-02 DIAGNOSIS — J189 Pneumonia, unspecified organism: Principal | ICD-10-CM | POA: Diagnosis present

## 2013-12-02 DIAGNOSIS — Z79899 Other long term (current) drug therapy: Secondary | ICD-10-CM

## 2013-12-02 DIAGNOSIS — Z923 Personal history of irradiation: Secondary | ICD-10-CM

## 2013-12-02 DIAGNOSIS — D849 Immunodeficiency, unspecified: Secondary | ICD-10-CM

## 2013-12-02 DIAGNOSIS — F411 Generalized anxiety disorder: Secondary | ICD-10-CM

## 2013-12-02 DIAGNOSIS — G893 Neoplasm related pain (acute) (chronic): Secondary | ICD-10-CM | POA: Diagnosis present

## 2013-12-02 DIAGNOSIS — C7952 Secondary malignant neoplasm of bone marrow: Secondary | ICD-10-CM

## 2013-12-02 DIAGNOSIS — K449 Diaphragmatic hernia without obstruction or gangrene: Secondary | ICD-10-CM | POA: Diagnosis present

## 2013-12-02 DIAGNOSIS — J9600 Acute respiratory failure, unspecified whether with hypoxia or hypercapnia: Secondary | ICD-10-CM | POA: Diagnosis present

## 2013-12-02 DIAGNOSIS — Z515 Encounter for palliative care: Secondary | ICD-10-CM

## 2013-12-02 DIAGNOSIS — C3490 Malignant neoplasm of unspecified part of unspecified bronchus or lung: Secondary | ICD-10-CM

## 2013-12-02 DIAGNOSIS — D638 Anemia in other chronic diseases classified elsewhere: Secondary | ICD-10-CM

## 2013-12-02 DIAGNOSIS — R509 Fever, unspecified: Secondary | ICD-10-CM

## 2013-12-02 DIAGNOSIS — C7800 Secondary malignant neoplasm of unspecified lung: Secondary | ICD-10-CM | POA: Diagnosis present

## 2013-12-02 DIAGNOSIS — A4101 Sepsis due to Methicillin susceptible Staphylococcus aureus: Secondary | ICD-10-CM

## 2013-12-02 DIAGNOSIS — J398 Other specified diseases of upper respiratory tract: Secondary | ICD-10-CM | POA: Diagnosis present

## 2013-12-02 DIAGNOSIS — K649 Unspecified hemorrhoids: Secondary | ICD-10-CM

## 2013-12-02 DIAGNOSIS — Z9109 Other allergy status, other than to drugs and biological substances: Secondary | ICD-10-CM

## 2013-12-02 DIAGNOSIS — D899 Disorder involving the immune mechanism, unspecified: Secondary | ICD-10-CM

## 2013-12-02 DIAGNOSIS — K589 Irritable bowel syndrome without diarrhea: Secondary | ICD-10-CM

## 2013-12-02 DIAGNOSIS — Z66 Do not resuscitate: Secondary | ICD-10-CM | POA: Diagnosis not present

## 2013-12-02 DIAGNOSIS — Z9221 Personal history of antineoplastic chemotherapy: Secondary | ICD-10-CM

## 2013-12-02 LAB — BASIC METABOLIC PANEL
BUN: 16 mg/dL (ref 6–23)
CHLORIDE: 94 meq/L — AB (ref 96–112)
CO2: 27 mEq/L (ref 19–32)
CREATININE: 0.48 mg/dL — AB (ref 0.50–1.35)
Calcium: 9 mg/dL (ref 8.4–10.5)
GFR calc Af Amer: >90 mL/min (ref >90)
GFR calc non Af Amer: >90 mL/min (ref >90)
Glucose, Bld: 116 mg/dL — ABNORMAL HIGH (ref 70–99)
Potassium: 4.1 mEq/L (ref 3.7–5.3)
Sodium: 134 mEq/L — ABNORMAL LOW (ref 137–147)

## 2013-12-02 LAB — CBC WITH DIFFERENTIAL/PLATELET
BASOS ABS: 0 10*3/uL (ref 0.0–0.1)
Basophils Relative: 0 % (ref 0–1)
Eosinophils Absolute: 0.2 10*3/uL (ref 0.0–0.7)
Eosinophils Relative: 2 % (ref 0–5)
HCT: 26.3 % — ABNORMAL LOW (ref 39.0–52.0)
Hemoglobin: 8.4 g/dL — ABNORMAL LOW (ref 13.0–17.0)
Lymphocytes Relative: 9 % — ABNORMAL LOW (ref 12–46)
Lymphs Abs: 1 10*3/uL (ref 0.7–4.0)
MCH: 30.1 pg (ref 26.0–34.0)
MCHC: 31.9 g/dL (ref 30.0–36.0)
MCV: 94.3 fL (ref 78.0–100.0)
MONO ABS: 1 10*3/uL (ref 0.1–1.0)
Monocytes Relative: 8 % (ref 3–12)
NEUTROS PCT: 80 % — AB (ref 43–77)
Neutro Abs: 9.2 10*3/uL — ABNORMAL HIGH (ref 1.7–7.7)
Platelets: 359 10*3/uL (ref 150–400)
RBC: 2.79 MIL/uL — ABNORMAL LOW (ref 4.22–5.81)
RDW: 17.8 % — AB (ref 11.5–15.5)
WBC: 11.5 10*3/uL — ABNORMAL HIGH (ref 4.0–10.5)

## 2013-12-02 LAB — URINALYSIS, ROUTINE W REFLEX MICROSCOPIC
Bilirubin Urine: NEGATIVE
Glucose, UA: NEGATIVE mg/dL
Hgb urine dipstick: NEGATIVE
KETONES UR: NEGATIVE mg/dL
Leukocytes, UA: NEGATIVE
Nitrite: NEGATIVE
Protein, ur: NEGATIVE mg/dL
SPECIFIC GRAVITY, URINE: 1.018 (ref 1.005–1.030)
UROBILINOGEN UA: 2 mg/dL — AB (ref 0.0–1.0)
pH: 7 (ref 5.0–8.0)

## 2013-12-02 LAB — I-STAT CG4 LACTIC ACID, ED: Lactic Acid, Venous: 1.08 mmol/L (ref 0.5–2.2)

## 2013-12-02 MED ORDER — SODIUM CHLORIDE 0.9 % IJ SOLN: 9.0000 mL | INTRAMUSCULAR | Status: DC | PRN

## 2013-12-02 MED ORDER — ONDANSETRON HCL 4 MG/2ML IJ SOLN: 4.0000 mg | Freq: Four times a day (QID) | INTRAMUSCULAR | Status: DC | PRN

## 2013-12-02 MED ORDER — FENTANYL 25 MCG/HR TD PT72
25.0000 ug | MEDICATED_PATCH | TRANSDERMAL | Status: DC
  Administered 2013-12-05: 25 ug via TRANSDERMAL
  Filled 2013-12-02 (×2): qty 1

## 2013-12-02 MED ORDER — SODIUM CHLORIDE 0.9 % IV BOLUS (SEPSIS)
1000.0000 mL | Freq: Once | INTRAVENOUS | Status: AC
  Administered 2013-12-02: 1000 mL via INTRAVENOUS

## 2013-12-02 MED ORDER — ALBUTEROL SULFATE (2.5 MG/3ML) 0.083% IN NEBU: 2.5000 mg | INHALATION_SOLUTION | RESPIRATORY_TRACT | Status: DC | PRN

## 2013-12-02 MED ORDER — PANTOPRAZOLE SODIUM 40 MG PO TBEC
40.0000 mg | DELAYED_RELEASE_TABLET | Freq: Every day | ORAL | Status: DC
  Administered 2013-12-02 – 2013-12-04 (×3): 40 mg via ORAL
  Filled 2013-12-02 (×2): qty 1

## 2013-12-02 MED ORDER — VANCOMYCIN HCL IN DEXTROSE 1-5 GM/200ML-% IV SOLN
1000.0000 mg | Freq: Three times a day (TID) | INTRAVENOUS | Status: DC
  Administered 2013-12-02 – 2013-12-04 (×5): 1000 mg via INTRAVENOUS
  Filled 2013-12-02 (×7): qty 200

## 2013-12-02 MED ORDER — GABAPENTIN 300 MG PO CAPS: 300.0000 mg | ORAL_CAPSULE | Freq: Every day | ORAL | Status: DC

## 2013-12-02 MED ORDER — ENSURE COMPLETE PO LIQD
237.0000 mL | Freq: Three times a day (TID) | ORAL | Status: DC
  Administered 2013-12-03 – 2013-12-06 (×10): 237 mL via ORAL

## 2013-12-02 MED ORDER — AZITHROMYCIN 500 MG IV SOLR
500.0000 mg | Freq: Once | INTRAVENOUS | Status: AC
  Administered 2013-12-02: 500 mg via INTRAVENOUS

## 2013-12-02 MED ORDER — SERTRALINE HCL 100 MG PO TABS
100.0000 mg | ORAL_TABLET | Freq: Every day | ORAL | Status: DC
  Administered 2013-12-02 – 2013-12-06 (×5): 100 mg via ORAL
  Filled 2013-12-02 (×5): qty 1

## 2013-12-02 MED ORDER — GABAPENTIN 300 MG PO CAPS
900.0000 mg | ORAL_CAPSULE | Freq: Every day | ORAL | Status: DC
  Administered 2013-12-02 – 2013-12-05 (×4): 900 mg via ORAL
  Filled 2013-12-02 (×5): qty 3

## 2013-12-02 MED ORDER — GABAPENTIN 300 MG PO CAPS
300.0000 mg | ORAL_CAPSULE | Freq: Every day | ORAL | Status: DC
  Administered 2013-12-03 – 2013-12-06 (×4): 300 mg via ORAL
  Filled 2013-12-02 (×5): qty 1

## 2013-12-02 MED ORDER — NALOXONE HCL 0.4 MG/ML IJ SOLN: 0.4000 mg | INTRAMUSCULAR | Status: DC | PRN

## 2013-12-02 MED ORDER — LORAZEPAM 0.5 MG PO TABS
0.5000 mg | ORAL_TABLET | Freq: Four times a day (QID) | ORAL | Status: DC | PRN
  Administered 2013-12-02 – 2013-12-06 (×10): 0.5 mg via ORAL
  Filled 2013-12-02 (×10): qty 1

## 2013-12-02 MED ORDER — IPRATROPIUM-ALBUTEROL 0.5-2.5 (3) MG/3ML IN SOLN
3.0000 mL | Freq: Three times a day (TID) | RESPIRATORY_TRACT | Status: DC
  Administered 2013-12-03 – 2013-12-06 (×7): 3 mL via RESPIRATORY_TRACT
  Filled 2013-12-02 (×10): qty 3

## 2013-12-02 MED ORDER — PROCHLORPERAZINE MALEATE 10 MG PO TABS
10.0000 mg | ORAL_TABLET | Freq: Four times a day (QID) | ORAL | Status: DC | PRN
  Filled 2013-12-02: qty 1

## 2013-12-02 MED ORDER — IPRATROPIUM-ALBUTEROL 0.5-2.5 (3) MG/3ML IN SOLN
3.0000 mL | RESPIRATORY_TRACT | Status: DC
  Administered 2013-12-02: 3 mL via RESPIRATORY_TRACT
  Filled 2013-12-02: qty 3

## 2013-12-02 MED ORDER — DIPHENHYDRAMINE HCL 12.5 MG/5ML PO ELIX: 12.5000 mg | ORAL_SOLUTION | Freq: Four times a day (QID) | ORAL | Status: DC | PRN

## 2013-12-02 MED ORDER — IOHEXOL 350 MG/ML SOLN
100.0000 mL | Freq: Once | INTRAVENOUS | Status: AC | PRN
  Administered 2013-12-02: 100 mL via INTRAVENOUS

## 2013-12-02 MED ORDER — POLYETHYLENE GLYCOL 3350 17 G PO PACK
17.0000 g | PACK | ORAL | Status: DC
  Administered 2013-12-04 – 2013-12-06 (×2): 17 g via ORAL
  Filled 2013-12-02 (×4): qty 1

## 2013-12-02 MED ORDER — SODIUM CHLORIDE 0.9 % IV SOLN
Freq: Once | INTRAVENOUS | Status: AC
  Administered 2013-12-02: 11:00:00 via INTRAVENOUS

## 2013-12-02 MED ORDER — DIPHENHYDRAMINE HCL 50 MG/ML IJ SOLN: 12.5000 mg | Freq: Four times a day (QID) | INTRAMUSCULAR | Status: DC | PRN

## 2013-12-02 MED ORDER — HYDROMORPHONE HCL PF 1 MG/ML IJ SOLN
1.0000 mg | INTRAMUSCULAR | Status: AC | PRN
  Administered 2013-12-02 (×2): 1 mg via INTRAVENOUS
  Filled 2013-12-02 (×2): qty 1

## 2013-12-02 MED ORDER — DEXTROSE 5 % IV SOLN
1.0000 g | Freq: Three times a day (TID) | INTRAVENOUS | Status: DC
  Administered 2013-12-02 – 2013-12-04 (×6): 1 g via INTRAVENOUS
  Filled 2013-12-02 (×8): qty 1

## 2013-12-02 MED ORDER — DEXTROSE 5 % IV SOLN
1.0000 g | Freq: Three times a day (TID) | INTRAVENOUS | Status: DC
  Administered 2013-12-02: 1 g via INTRAVENOUS
  Filled 2013-12-02 (×3): qty 1

## 2013-12-02 MED ORDER — ONDANSETRON HCL 4 MG/2ML IJ SOLN
4.0000 mg | Freq: Once | INTRAMUSCULAR | Status: AC
  Administered 2013-12-02: 4 mg via INTRAVENOUS
  Filled 2013-12-02: qty 2

## 2013-12-02 MED ORDER — HYDROMORPHONE 0.3 MG/ML IV SOLN
INTRAVENOUS | Status: DC
  Administered 2013-12-02: 3.5 mg via INTRAVENOUS
  Administered 2013-12-02: 16:00:00 via INTRAVENOUS
  Administered 2013-12-03: 3.47 mg via INTRAVENOUS
  Administered 2013-12-03: 1.5 mg via INTRAVENOUS
  Administered 2013-12-03: 0.9 mg via INTRAVENOUS
  Administered 2013-12-03: 1.5 mg via INTRAVENOUS
  Administered 2013-12-03: 2.1 mg via INTRAVENOUS
  Administered 2013-12-03: 08:00:00 via INTRAVENOUS
  Administered 2013-12-03: 1.08 mg via INTRAVENOUS
  Administered 2013-12-04: 0.6 mg via INTRAVENOUS
  Administered 2013-12-04: 2.1 mg via INTRAVENOUS
  Administered 2013-12-04: 0.6 mg via INTRAVENOUS
  Filled 2013-12-02 (×2): qty 25

## 2013-12-02 MED ORDER — DEXAMETHASONE 2 MG PO TABS
2.0000 mg | ORAL_TABLET | Freq: Every day | ORAL | Status: DC
  Administered 2013-12-03 – 2013-12-06 (×3): 2 mg via ORAL
  Filled 2013-12-02 (×5): qty 1

## 2013-12-02 MED ORDER — VANCOMYCIN HCL 10 G IV SOLR
2000.0000 mg | Freq: Once | INTRAVENOUS | Status: AC
  Administered 2013-12-02: 2000 mg via INTRAVENOUS
  Filled 2013-12-02: qty 2000

## 2013-12-02 MED ORDER — SODIUM CHLORIDE 0.9 % IJ SOLN
9.0000 mL | INTRAMUSCULAR | Status: DC | PRN
Start: 2013-12-02 — End: 2013-12-02

## 2013-12-02 MED ORDER — CEFTRIAXONE SODIUM 1 G IJ SOLR
1.0000 g | Freq: Once | INTRAMUSCULAR | Status: AC
  Administered 2013-12-02: 1 g via INTRAVENOUS
  Filled 2013-12-02: qty 10

## 2013-12-02 MED ORDER — GABAPENTIN 300 MG PO CAPS
600.0000 mg | ORAL_CAPSULE | Freq: Every day | ORAL | Status: DC
  Administered 2013-12-03 – 2013-12-06 (×4): 600 mg via ORAL
  Filled 2013-12-02 (×4): qty 2

## 2013-12-02 MED ORDER — SODIUM CHLORIDE 0.9 % IV SOLN
INTRAVENOUS | Status: DC
  Administered 2013-12-02 – 2013-12-05 (×4): via INTRAVENOUS

## 2013-12-02 NOTE — ED Notes (Signed)
Bed: OM85 Expected date:  Expected time:  Means of arrival:  Comments: ems- lung cancer, fever,

## 2013-12-02 NOTE — ED Notes (Signed)
Patient transported to CT 

## 2013-12-02 NOTE — ED Notes (Signed)
Pt has stage 4 lung CA states pain has increased w/o relief with home meds. Pain in left shoulder, rib, and side. Pt also has fever. Pt has been coughing up blood has gotten worse over the past couple of days.

## 2013-12-02 NOTE — ED Notes (Signed)
Patient transported to X-ray 

## 2013-12-02 NOTE — ED Notes (Signed)
Patient is aware that we need sample, patient states that he cannot give one at this time.

## 2013-12-02 NOTE — H&P (Addendum)
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 Patient also reports progressive fatigue and pain symptoms unimproved with current home pain regimen. Patient reports having subjective fever and chills at home for 2 days. He also reports off and on him off this for last 2 weeks. Patient denies headache, dizziness, , nausea , vomiting,  palpitations, SOB, abdominal pain, bowel or urinary.   Review of Systems:  Constitutional: Denies fever, chills, appetite change and fatigue.  HEENT: Denies photophobia, , hearing loss, ear pain, congestion, sore throat, rhinorrhea,  mouth sores,  neck pain, neck stiffness and tinnitus.   Respiratory: Cough, hemoptysis, dyspnea on exertion, chest wall pain, Denies chest tightness,  and wheezing.   Cardiovascular: Denies  palpitations , leg swelling.  Gastrointestinal: Denies nausea, vomiting, abdominal pain, diarrhea, constipation, blood in stool and abdominal distention.  Genitourinary: Denies dysuria, urgency, frequency, hematuria, flank pain and difficulty urinating.  Musculoskeletal: back pain,Denies myalgias,  joint swelling, arthralgias and gait problem.  Skin: Denies pallor, rash and wound.  Neurological: Denies dizziness, seizures, syncope, weakness, light-headedness, numbness and headaches.  Psychiatric/Behavioral: Denies  confusion, nervousness, sleep  disturbance   Past Medical History  Diagnosis Date  . Hemorrhoids   . Hypertension     no medications at present  . GERD (gastroesophageal reflux disease)     no medication  . Depression     treated approx 12 years ago, no treatment at this time  . Hiatal hernia   . Lesion of left lung 03/14/12    CT chest, [facility], squamous cell carcinoma  . Facial rash 05/24/2012  . Tongue cancer 12/2010    left side of tongue T1 lesion excised 4/12  . Lung metastasis dx'd 03/2012  . Cancer 08/2011    oral tongue  . Hx of radiation therapy 07/24/12- 07-26-12    left iliac bone, 18 Gy 3 sessions  . Hx of radiation therapy 11/15/11- 01/05/12    left neck, oral cavity6600 cGy 33 sessions  . Bone metastasis 04/25/12    PET scan-left iliac bone  . Hx antineoplastic chemotherapy 2013    Carboplatin/Taxol/Erbitux  . S/P radiation therapy 07/17/2013-07/30/2013     Left chest was treated to a total dose of 30 gray in 10 fractions at 3 gray per fraction   Past Surgical History  Procedure Laterality Date  . Inguinal herniorrhapy    . Tonsillectomy and adenoidectomy    . Excision of tongue mass      April 2012  . Hernia repair    . Neck mass excision  07/27/2011  . Radical neck dissection  07/27/2011    Procedure: RADICAL NECK DISSECTION;  Surgeon: Thomas Nunnery, Bean;  Location: [place];  Service: ENT;  Laterality: Left;  Marland Kitchen Mandibulectomy  09/2011    and scapular free flap  . Modified radical neck dissection  03/27/12    excision of lip lesion, lip benign, right neck squamous cell ca   Social History:  reports that he quit smoking about 2 years ago. His smoking use included Cigarettes. He has a 5 pack-year smoking history. He has never used smokeless tobacco. He reports that he drinks alcohol. He reports that he does not use illicit drugs.  Allergies  Allergen Reactions  . Morphine And Related Nausea And Vomiting  . Tape Other (See Comments)    Blisters; please use paper tape  . Codeine Phosphate  Swelling and Rash    Family History  Problem Relation Age of Onset  . Adopted: Yes  . Other      biological parents may have had cerebral aneurysm   . Vision loss Paternal Aunt     Prior to Admission medications   Medication Sig Start Date End Date Taking? Authorizing Provider  dexamethasone (DECADRON) 2 MG tablet Take 1 tablet (2 mg total) by mouth daily. 11/04/13  Yes Thomas Norway, Bean  fentaNYL (DURAGESIC - DOSED MCG/HR) 25 MCG/HR patch Place 25 mcg onto the skin every 3 (three) days.   Yes Historical Provider, Bean  fentaNYL (DURAGESIC - DOSED MCG/HR) 50 MCG/HR Place 50 mcg onto the skin every 3 (three) days.  11/04/13  Yes Thomas Norway, Bean  gabapentin (NEURONTIN) 300 MG capsule Take 300-900 mg by mouth 3 (three) times daily. Take one capsule in the morning, 2 capsules at noon, and three capsules at bedtime 10/22/13  Yes Thomas Norway, Bean  HYDROmorphone (DILAUDID) 4 MG tablet Take 1 tablet (4 mg total) by mouth every 4 (four) hours as needed for severe pain. 11/25/13  Yes Thomas Norway, Bean  lidocaine-prilocaine (EMLA) cream Apply topically as needed. Apply to [facility] site one hour prior to needle stick to numb skin. 08/27/13  Yes Thomas Norway, Bean  LORazepam (ATIVAN) 0.5 MG tablet Take 1 tablet (0.5 mg total) by mouth every 6 (six) hours as needed for anxiety. 11/26/13  Yes Thomas Norway, Bean  Nutritional Supplements (FEEDING SUPPLEMENT, OSMOLITE 1.5 CAL,) LIQD Begin Osmolite 1.5 at 60 ml/hr for 12 hours nocturnal continuous feeding daily. Flush with 60 ml free water before and after continuous feedings. Continue 1 can of Osmolite 1.5 via gravity feeding TID with 60 ml free water before and after each bolus feeding. Please send pump to patient. 08/27/12  Yes Thomas Putnam, Bean  omeprazole (PRILOSEC) 10 MG capsule Take 10 mg by mouth daily.   Yes Historical Provider, Bean  polyethylene glycol (MIRALAX / GLYCOLAX) packet Take 17 g by mouth 3 (three) times a week.   Yes Historical Provider, Bean  prochlorperazine  (COMPAZINE) 10 MG tablet Take 1 tablet (10 mg total) by mouth every 6 (six) hours as needed. 07/12/13  Yes Thomas Norway, Bean  sertraline (ZOLOFT) 100 MG tablet Take 1 tablet (100 mg total) by mouth daily. 07/05/13  Yes Lisabeth Pick, Bean     Physical Exam:  Filed Vitals:   12/02/13 1210 12/02/13 1300 12/02/13 1327 12/02/13 1450  BP: 118/75   123/68  Pulse: 111   110  Temp: 99.6 F (37.6 C)   99.7 F (37.6 C)  TempSrc: Oral   Oral  Resp: 20   14  Height:  6' 0.05" (1.83 m) 5\' 11"  (1.803 m)   Weight:  78.7 kg (173 lb 8 oz) 78.926 kg (174 lb)   SpO2: 96%   94%    Constitutional: Vital signs reviewed.  Patient is a middle aged male in no acute distress. HEENT: no pallor, no icterus,s/p oral reconstruction for tongue ca, no cervical lymphadenopathy Cardiovascular:  S1  S2  tachycardic, no MRG Chest: CTAB, left basilar crackles, scattered rhonchi Abdominal: Soft. Non-tender, non-distended, bowel sounds are normal, no masses, organomegaly, or guarding present.  Ext: warm, trace edema Neurological: A&O x3, non focal  Labs on Admission:  Basic Metabolic Panel:  Recent Labs Lab 11/26/13 0830 12/02/13 1050  NA 139 134*  K 4.0 4.1  CL  --  94*  CO2 28 27  GLUCOSE 141* 116*  BUN 11.9 16  CREATININE 0.7 0.48*  CALCIUM 9.8 9.0   Liver Function Tests:  Recent Labs Lab 11/26/13 0830  AST 16  ALT 17  ALKPHOS 69  BILITOT 0.36  PROT 7.5  ALBUMIN 3.0*   No results found for this basename: LIPASE, AMYLASE,  in the last 168 hours No results found for this basename: AMMONIA,  in the last 168 hours CBC:  Recent Labs Lab 11/26/13 0828 12/02/13 1050  WBC 7.4 11.5*  NEUTROABS 6.0 9.2*  HGB 9.1* 8.4*  HCT 27.8* 26.3*  MCV 92.4 94.3  PLT 323 359   Cardiac Enzymes: No results found for this basename: CKTOTAL, CKMB, CKMBINDEX, TROPONINI,  in the last 168 hours BNP: No components found with this basename: POCBNP,  CBG: No results found for this basename: GLUCAP,  in the last  168 hours  Radiological Exams on Admission: Dg Chest 2 View  12/02/2013   CLINICAL DATA:  Lung cancer.  Fever pain.  EXAM: CHEST  2 VIEW  COMPARISON:  CT chest abdomen pelvis with contrast 11/18/2013 and CT chest 08/22/2013  FINDINGS: Left subclavian power Port-A-Cath terminates at the junction of superior vena cava and right atrium. Heart size is normal. There is left hilar prominence, similar compared to scout view of recent chest abdomen pelvis CT 11/18/2013. Large peripheral opacity in the superior segment of the left lower lobe corresponds in location to the patient's known left lung mass. The margins of the mass are ill-defined. Associated obstructive pneumonitis/infection cannot be excluded.  The right lung appears well expanded and clear. There are surgical clips at the thoracic inlet on the right. Negative for pleural effusion or pneumothorax. Thin peripheral calcifications in the left upper quadrant are known to be associated with a probably benign splenic lesion as described on prior CT. There are left axillary surgical clips. No acute or suspicious osseous lesion identified.  IMPRESSION: 1. Mass in the superior segment left upper lobe corresponds with known left lung mass is described on prior chest CT. The margins are ill defined, an associated postobstructive pneumonitis cannot be excluded. Number to the right lung is clear. 2. Persistent left hilar prominence likely reflects adenopathy. 3. Right lung is clear. 4. Satisfactory position Port-A-Cath.   Electronically Signed   By: Curlene Dolphin M.D.   On: 12/02/2013 11:37   Ct Angio Chest Pe W/cm &/or Wo Cm  12/02/2013   CLINICAL DATA:  Chest pain, cough and hemoptysis. 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 Evaluate for pulmonary embolus.  EXAM: CT ANGIOGRAPHY CHEST WITH CONTRAST  TECHNIQUE: Multidetector CT imaging of the chest was performed using the standard protocol during bolus  administration of intravenous contrast. Multiplanar CT image reconstructions and MIPs were obtained to evaluate the vascular anatomy.  CONTRAST:  151mL OMNIPAQUE IOHEXOL 350 MG/ML SOLN  COMPARISON:  Most recent prior chest CT 11/18/2013  FINDINGS: Mediastinum: Progressive interval enlargement of the left hilar confluent nodal mass. At the level of the left mainstem bronchus, the mass measures approximately 4.0  x 3.7 cm today compared to 3.5 x 3.2 cm previously. When measured along its longest axis, the mass measures 5.4 cm compared to 4.6 cm previously. Small right paratracheal lymph nodes are also slightly enlarged at 6 mm compared to 4 mm previously on image 24 of series 5. Unremarkable appearance of the esophagus. Calcified right aortic 0 pulmonary window noted.  Heart/Vascular: Adequate opacification of the pulmonary arteries to the proximal subsegmental level. Narrowing of the left upper lobe pulmonary artery secondary to mass effect from the left hilar mass. No focal filling defect to suggest acute pulmonary embolus. The heart is within normal limits for size. No pericardial effusion. Left IJ approach central venous catheter. The catheter tip terminates in the upper right atrium. Conventional 3 vessel arch. No aneurysmal dilatation or evidence of dissection.  Lungs/Pleura: Relatively mild paraseptal emphysema most notable in the lung apices. Interlobular septal thickening is noted in both upper lobes. The enlarging mass like consolidation extending from the left hilar mass to the pleural surface. Using a region of bridging soft tissue between the hila an the pleural based mass, the larger component of the pleural based mass measures approximately 4.4 x 4.0 cm today compared to 3.9 x 3.9 cm previously. Persistent irregular soft tissue protruding into the left mainstem bronchus resulting in worsened narrowing of the airway. The supplied left lower lobe bronchus is now almost completely obliterated. The segmental  left lower lobe bronchi are impacted with debris and there is new tree-in-bud micro nodularity in a peribronchovascular distribution throughout the associated left lower lobe particularly in the posterobasal and medial basal segments. Small left pleural effusion.  Bones/Soft Tissues: Irregular mottled appearance of the posterolateral aspect of the left fourth and fifth ribs in the region of the abutting pleural mass concerning for osseous invasion. No pathologic fracture.  Upper Abdomen: No new acute abnormality. Stable appearance of multi-septated calcified lesion in the posterior spleen.  Review of the MIP images confirms the above findings.  IMPRESSION: 1. Negative for acute pulmonary embolus. 2. Continued progression of metastatic disease compared to 11/18/2013. The pertinent findings on today's examination include progressive invasion and obstruction of the left lower lobe bronchus. This subtending segmental left lower lobe bronchi are impacted with debris and there is associated tree-in-bud micro nodularity in the posterior left lower lobe concerning for pneumonia versus obstructive pneumonitis. Additionally, there has been progressive narrowing of the left mainstem bronchus which may progress to complete obstruction in the near future. 3. Small left pleural effusion may be malignant or parapneumonic. 4. Additional ancillary findings as above.   Electronically Signed   By: Jacqulynn Cadet M.D.   On: 12/02/2013 14:54    EKG:   Assessment/Plan Principal Problem:   Healthcare-associated pneumonia   Active Problems:   HYPERTENSION   Squamous cell carcinoma of tongue   Anxiety   Bone metastasis   Cancer related pain   Metastatic carcinoma to lung   SIRS (systemic inflammatory response syndrome)     SIRS with Healthcare associated pneumonia Admit to  Tele. Possibly post obstructive PNA -start empiric vancomycin and cefepime. dosing per pharmacy -Follow blood cx. Sputum cx, urine for strep  and legionella.  -02 via [place] prn. Will add  albuterol and Atrovent nebs prn -Patient reports off-and-on hemoptysis which possibly due to  lung metastases and underlying postobstructive pneumonia.  Hypertension Depression stable  Squamous cell carcinoma of the tongue with lung and bony metastases follows with Thomas Bean s/p headache neck dissection in 2012 Has bone and  left lung metastases. Given palliative chemotherapy. Pain medications have been adjusted outpatient for progressive pain without much edema. He has a little progression of the disease on recent CT scan of the abdomen.  Patient being evaluated at [facility] for clinical trial involvement. Patient planned to be  started on IV methotrexate. -Given unimproved been on home regimen will start him on Dilaudid-PCA. Continue fentanyl patch. -Given Decadron and Neurontin -continue PPI  Anemia Hemoglobin currently stable    Diet:clear liquids with ensure and tube feeds at night  DVT prophylaxis:SCDs  Consults I Have  added Thomas Bean in the consult team. Please notify him in the morning  Code Status: full code Family Communication: Mother  at bedside Disposition Plan: Home once improved  Kiaya Haliburton, [facility] Pager [phone]  Total time spent on admission :70 minutes  If 7PM-7AM, please contact night-coverage [email or web address] Password [facility] 12/02/2013, 3:51 PM

## 2013-12-02 NOTE — Progress Notes (Signed)
   CARE MANAGEMENT ED NOTE 12/02/2013  Patient:  Thomas Bean, Thomas Bean   Account Number:  [id number]  Date Initiated:  12/02/2013  Documentation initiated by:  Jackelyn Poling  Subjective/Objective Assessment:   53 yr old self pay c/o sob tongue cancer with mets to lung- stage 4 lung cancer, fever, pain     Subjective/Objective Assessment Detail:   pcp swords, bruce  oncologist, Dr. Lorelle Formosa  hgb 8.4 wbc 11.5  Cm consult for issues with affording medications Medicaid not active per ED registration     Action/Plan:   CM spoke with patient and mother at bedside   Action/Plan Detail:   Provided pt with information for briges to access, partnership for prescription assistance & needymeds.org  1550 Cm spoke with Thermon Leyland [phone] of [facility] to have her to assist pt with services for medications  see below   Anticipated DC Date:       Status Recommendation to Physician:   Result of Recommendation:    Other ED Services  Consult [facility] [place]  Other  Outpatient Services - Pt will follow up    Choice offered to / List presented to:  C-1 Patient          Status of service:  Completed, signed off  ED Comments:   ED Comments Detail:  Raquel states she turned in information for pt on 11/27/13 after speaking with pt's father and will contact pt's caseworker to re activate his medicaid Pt voiced appreciation of assistance and for resources provided

## 2013-12-02 NOTE — Progress Notes (Signed)
ANTIBIOTIC CONSULT NOTE - INITIAL  Pharmacy Consult for Vancomycin, Cefepime Indication: HCAP  Allergies  Allergen Reactions  . Morphine And Related Nausea And Vomiting  . Tape Other (See Comments)    Blisters; please use paper tape  . Codeine Phosphate Swelling and Rash    Patient Measurements:   Adjusted Body Weight:   Vital Signs: Temp: 99.6 F (37.6 C) (03/23 1210) Temp src: Oral (03/23 1210) BP: 118/75 mmHg (03/23 1210) Pulse Rate: 111 (03/23 1210) Intake/Output from previous day:   Intake/Output from this shift:    Labs:  Recent Labs  12/02/13 1050  WBC 11.5*  HGB 8.4*  PLT 359  CREATININE 0.48*   The CrCl is unknown because both a height and weight (above a minimum accepted value) are required for this calculation. No results found for this basename: VANCOTROUGH, VANCOPEAK, VANCORANDOM, GENTTROUGH, GENTPEAK, GENTRANDOM, TOBRATROUGH, TOBRAPEAK, TOBRARND, AMIKACINPEAK, AMIKACINTROU, AMIKACIN,  in the last 72 hours   Microbiology: No results found for this or any previous visit (from the past 720 hour(s)).  Medical History: Past Medical History  Diagnosis Date  . Hemorrhoids   . Hypertension     no medications at present  . GERD (gastroesophageal reflux disease)     no medication  . Depression     treated approx 12 years ago, no treatment at this time  . Hiatal hernia   . Lesion of left lung 03/14/12    CT chest, [facility], squamous cell carcinoma  . Facial rash 05/24/2012  . Tongue cancer 12/2010    left side of tongue T1 lesion excised 4/12  . Lung metastasis dx'd 03/2012  . Cancer 08/2011    oral tongue  . Hx of radiation therapy 07/24/12- 07-26-12    left iliac bone, 18 Gy 3 sessions  . Hx of radiation therapy 11/15/11- 01/05/12    left neck, oral cavity6600 cGy 33 sessions  . Bone metastasis 04/25/12    PET scan-left iliac bone  . Hx antineoplastic chemotherapy 2013    Carboplatin/Taxol/Erbitux  . S/P radiation therapy 07/17/2013-07/30/2013     Left  chest was treated to a total dose of 30 gray in 10 fractions at 3 gray per fraction   Assessment: 53yoM presents with SOB and fever.  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 CXR shows possible postobstructive pneumonia.  Pharmacy consulted to dose Vancomycin and Cefepime for HCAP.    3/17  Ht: 183cm Wt: 78.7kg  No antibiotic allergies noted  3/23 >> Ceftriaxone x1 >> 3/23 3/23 >> Azithromycin x1 >> 3/23 3/23 >> Vancomycin  >> 3/23 >> Cefepime  >>    Tmax: 102 WBCs:11.5 Renal:SCr 0.48, CrCl >100 N/CG (using rounded 0.8)  3/23 blood: 2/2 collected 3/23 urine: collected   Goal of Therapy:  Vancomycin trough level 15-20 mcg/ml  Plan:  Vancomycin 2g IV x 1, then 1g IV q8h Cefepime 1g IV q8h F/u renal function, cultures, clinical course  Ralene Bathe, PharmD, BCPS 12/02/2013, 1:40 PM  Pager: [phone]

## 2013-12-02 NOTE — Progress Notes (Signed)
UR completed 

## 2013-12-02 NOTE — ED Provider Notes (Signed)
CSN: [id number]     Arrival date & time 12/02/13  1009 History   First MD Initiated Contact with Patient 12/02/13 1013     Chief Complaint  Patient presents with  . Lung Cancer  . Fever  . Pain      HPI  Patient presents with chest pain shortness of breath. He has a known history of a tongue cancer status post pulmonary metastases. He is progressing despite aggressive chemotherapy. He follows with his oncologist, Dr. Lorelle Formosa. He is essentially on palliative chemotherapy. He is on by mouth Dilaudid at home. He awakened this morning short of breath with cough. Left-sided chest pain. Hemoptysis. Temperature 102 upon his arrival here. No massive hemoptysis.  Past Medical History  Diagnosis Date  . Hemorrhoids   . Hypertension     no medications at present  . GERD (gastroesophageal reflux disease)     no medication  . Depression     treated approx 12 years ago, no treatment at this time  . Hiatal hernia   . Lesion of left lung 03/14/12    CT chest, [facility], squamous cell carcinoma  . Facial rash 05/24/2012  . Tongue cancer 12/2010    left side of tongue T1 lesion excised 4/12  . Lung metastasis dx'd 03/2012  . Cancer 08/2011    oral tongue  . Hx of radiation therapy 07/24/12- 07-26-12    left iliac bone, 18 Gy 3 sessions  . Hx of radiation therapy 11/15/11- 01/05/12    left neck, oral cavity6600 cGy 33 sessions  . Bone metastasis 04/25/12    PET scan-left iliac bone  . Hx antineoplastic chemotherapy 2013    Carboplatin/Taxol/Erbitux  . S/P radiation therapy 07/17/2013-07/30/2013     Left chest was treated to a total dose of 30 gray in 10 fractions at 3 gray per fraction   Past Surgical History  Procedure Laterality Date  . Inguinal herniorrhapy    . Tonsillectomy and adenoidectomy    . Excision of tongue mass      April 2012  . Hernia repair    . Neck mass excision  07/27/2011  . Radical neck dissection  07/27/2011    Procedure: RADICAL NECK DISSECTION;  Surgeon: Rozetta Nunnery, MD;  Location: [facility];  Service: ENT;  Laterality: Left;  Marland Kitchen Mandibulectomy  09/2011    and scapular free flap  . Modified radical neck dissection  03/27/12    excision of lip lesion, lip benign, right neck squamous cell ca   Family History  Problem Relation Age of Onset  . Adopted: Yes  . Other      biological parents may have had cerebral aneurysm   . Vision loss Paternal Aunt    History  Substance Use Topics  . Smoking status: Former Smoker -- 0.25 packs/day for 20 years    Types: Cigarettes    Quit date: 07/13/2011  . Smokeless tobacco: Never Used  . Alcohol Use: Yes     Comment: approx. 2 beers or wine per week    Review of Systems  Constitutional: Positive for fever and fatigue. Negative for chills, diaphoresis and appetite change.  HENT: Negative for mouth sores, sore throat and trouble swallowing.   Eyes: Negative for visual disturbance.  Respiratory: Positive for shortness of breath. Negative for cough, chest tightness and wheezing.        Hemoptysis  Cardiovascular: Positive for chest pain.  Gastrointestinal: Negative for nausea, vomiting, abdominal pain, diarrhea and abdominal distention.  Endocrine: Negative  for polydipsia, polyphagia and polyuria.  Genitourinary: Negative for dysuria, frequency and hematuria.  Musculoskeletal: Negative for gait problem.  Skin: Negative for color change, pallor and rash.  Neurological: Negative for dizziness, syncope, light-headedness and headaches.  Hematological: Does not bruise/bleed easily.  Psychiatric/Behavioral: Negative for behavioral problems and confusion.      Allergies  Morphine and related; Tape; and Codeine phosphate  Home Medications   Current Outpatient Rx  Name  Route  Sig  Dispense  Refill  . dexamethasone (DECADRON) 2 MG tablet   Oral   Take 1 tablet (2 mg total) by mouth daily.   30 tablet   0   . fentaNYL (DURAGESIC - DOSED MCG/HR) 25 MCG/HR patch   Transdermal   Place 25 mcg onto the skin  every 3 (three) days.         . fentaNYL (DURAGESIC - DOSED MCG/HR) 50 MCG/HR   Transdermal   Place 50 mcg onto the skin every 3 (three) days.          Marland Kitchen gabapentin (NEURONTIN) 300 MG capsule   Oral   Take 300-900 mg by mouth 3 (three) times daily. Take one capsule in the morning, 2 capsules at noon, and three capsules at bedtime         . HYDROmorphone (DILAUDID) 4 MG tablet   Oral   Take 1 tablet (4 mg total) by mouth every 4 (four) hours as needed for severe pain.   180 tablet   0   . lidocaine-prilocaine (EMLA) cream   Topical   Apply topically as needed. Apply to [facility] site one hour prior to needle stick to numb skin.   30 g   3   . LORazepam (ATIVAN) 0.5 MG tablet   Oral   Take 1 tablet (0.5 mg total) by mouth every 6 (six) hours as needed for anxiety.   120 tablet   0   . Nutritional Supplements (FEEDING SUPPLEMENT, OSMOLITE 1.5 CAL,) LIQD      Begin Osmolite 1.5 at 60 ml/hr for 12 hours nocturnal continuous feeding daily. Flush with 60 ml free water before and after continuous feedings. Continue 1 can of Osmolite 1.5 via gravity feeding TID with 60 ml free water before and after each bolus feeding. Please send pump to patient.   1422 mL   0     Dispense as written.   Marland Kitchen omeprazole (PRILOSEC) 10 MG capsule   Oral   Take 10 mg by mouth daily.         . polyethylene glycol (MIRALAX / GLYCOLAX) packet   Oral   Take 17 g by mouth 3 (three) times a week.         . prochlorperazine (COMPAZINE) 10 MG tablet   Oral   Take 1 tablet (10 mg total) by mouth every 6 (six) hours as needed.   60 tablet   1   . sertraline (ZOLOFT) 100 MG tablet   Oral   Take 1 tablet (100 mg total) by mouth daily.   90 tablet   3    BP 118/75  Pulse 111  Temp(Src) 99.6 F (37.6 C) (Oral)  Resp 20  SpO2 96% Physical Exam  Constitutional: He is oriented to person, place, and time. He appears well-developed. No distress.  HENT:  Head: Normocephalic.    Eyes:  Conjunctivae are normal. Pupils are equal, round, and reactive to light. No scleral icterus.  Neck: Normal range of motion. Neck supple. No thyromegaly present.  Cardiovascular: Normal rate and regular rhythm.  Exam reveals no gallop and no friction rub.   No murmur heard. Pulmonary/Chest: Effort normal and breath sounds normal. No respiratory distress. He has no wheezes. He has no rales.  Pale and left lateral chest with any movement. It diminished upper and mid left-sided breath sounds with prolongation.  Abdominal: Soft. Bowel sounds are normal. He exhibits no distension. There is no tenderness. There is no rebound.  Musculoskeletal: Normal range of motion.  Neurological: He is alert and oriented to person, place, and time.  Skin: Skin is warm and dry. No rash noted.  Psychiatric: He has a normal mood and affect. His behavior is normal.    ED Course  Procedures (including critical care time) Labs Review Labs Reviewed  CBC WITH DIFFERENTIAL - Abnormal; Notable for the following:    WBC 11.5 (*)    RBC 2.79 (*)    Hemoglobin 8.4 (*)    HCT 26.3 (*)    RDW 17.8 (*)    Neutrophils Relative % 80 (*)    Neutro Abs 9.2 (*)    Lymphocytes Relative 9 (*)    All other components within normal limits  BASIC METABOLIC PANEL - Abnormal; Notable for the following:    Sodium 134 (*)    Chloride 94 (*)    Glucose, Bld 116 (*)    Creatinine, Ser 0.48 (*)    All other components within normal limits  CULTURE, BLOOD (ROUTINE X 2)  CULTURE, BLOOD (ROUTINE X 2)  URINE CULTURE  URINALYSIS, ROUTINE W REFLEX MICROSCOPIC  I-STAT CG4 LACTIC ACID, ED   Imaging Review Dg Chest 2 View  12/02/2013   CLINICAL DATA:  Lung cancer.  Fever pain.  EXAM: CHEST  2 VIEW  COMPARISON:  CT chest abdomen pelvis with contrast 11/18/2013 and CT chest 08/22/2013  FINDINGS: Left subclavian power Port-A-Cath terminates at the junction of superior vena cava and right atrium. Heart size is normal. There is left hilar  prominence, similar compared to scout view of recent chest abdomen pelvis CT 11/18/2013. Large peripheral opacity in the superior segment of the left lower lobe corresponds in location to the patient's known left lung mass. The margins of the mass are ill-defined. Associated obstructive pneumonitis/infection cannot be excluded.  The right lung appears well expanded and clear. There are surgical clips at the thoracic inlet on the right. Negative for pleural effusion or pneumothorax. Thin peripheral calcifications in the left upper quadrant are known to be associated with a probably benign splenic lesion as described on prior CT. There are left axillary surgical clips. No acute or suspicious osseous lesion identified.  IMPRESSION: 1. Mass in the superior segment left upper lobe corresponds with known left lung mass is described on prior chest CT. The margins are ill defined, an associated postobstructive pneumonitis cannot be excluded. Number to the right lung is clear. 2. Persistent left hilar prominence likely reflects adenopathy. 3. Right lung is clear. 4. Satisfactory position Port-A-Cath.   Electronically Signed   By: Curlene Dolphin M.D.   On: 12/02/2013 11:37     EKG Interpretation None      MDM   Final diagnoses:  Fever  Immunocompromised  Lung cancer  Healthcare-associated pneumonia   Blood cultures obtained. CT is requested. He has abnormal chest x-ray with possibility of postobstructive pneumonia. He has left-sided chest pain. Not requiring O2 at this point. Other immunocompromise with recent chemotherapy is not neutropenic. Initially given Rocephin Zithromax. After my discussion with hospitalist  the decision was made to treat this with vancomycin and cefepime has been requested.  A bed been requested.    Tanna Furry, MD 12/02/13 1327

## 2013-12-02 NOTE — Telephone Encounter (Signed)
Pt's mother called to inform us that pt is on his way to ER by EMS. She states he is in so much pain and he is vomiting a lot of blood. Dr. Juliann Mule notified.

## 2013-12-03 DIAGNOSIS — R042 Hemoptysis: Secondary | ICD-10-CM

## 2013-12-03 DIAGNOSIS — J189 Pneumonia, unspecified organism: Principal | ICD-10-CM

## 2013-12-03 DIAGNOSIS — F411 Generalized anxiety disorder: Secondary | ICD-10-CM

## 2013-12-03 DIAGNOSIS — G893 Neoplasm related pain (acute) (chronic): Secondary | ICD-10-CM

## 2013-12-03 DIAGNOSIS — R509 Fever, unspecified: Secondary | ICD-10-CM

## 2013-12-03 DIAGNOSIS — D649 Anemia, unspecified: Secondary | ICD-10-CM

## 2013-12-03 DIAGNOSIS — C7800 Secondary malignant neoplasm of unspecified lung: Secondary | ICD-10-CM

## 2013-12-03 DIAGNOSIS — C7951 Secondary malignant neoplasm of bone: Secondary | ICD-10-CM

## 2013-12-03 DIAGNOSIS — C7952 Secondary malignant neoplasm of bone marrow: Secondary | ICD-10-CM

## 2013-12-03 DIAGNOSIS — C770 Secondary and unspecified malignant neoplasm of lymph nodes of head, face and neck: Secondary | ICD-10-CM

## 2013-12-03 LAB — URINE CULTURE
COLONY COUNT: NO GROWTH
Culture: NO GROWTH

## 2013-12-03 LAB — BASIC METABOLIC PANEL
BUN: 9 mg/dL (ref 6–23)
CO2: 27 mEq/L (ref 19–32)
Calcium: 8.4 mg/dL (ref 8.4–10.5)
Chloride: 98 mEq/L (ref 96–112)
Creatinine, Ser: 0.42 mg/dL — ABNORMAL LOW (ref 0.50–1.35)
GFR calc Af Amer: >90 mL/min (ref >90)
GFR calc non Af Amer: >90 mL/min (ref >90)
GLUCOSE: 112 mg/dL — AB (ref 70–99)
Potassium: 4.1 mEq/L (ref 3.7–5.3)
Sodium: 135 mEq/L — ABNORMAL LOW (ref 137–147)

## 2013-12-03 LAB — CBC
HCT: 22.8 % — ABNORMAL LOW (ref 39.0–52.0)
HEMOGLOBIN: 7.1 g/dL — AB (ref 13.0–17.0)
MCH: 29.6 pg (ref 26.0–34.0)
MCHC: 31.1 g/dL (ref 30.0–36.0)
MCV: 95 fL (ref 78.0–100.0)
Platelets: 299 10*3/uL (ref 150–400)
RBC: 2.4 MIL/uL — ABNORMAL LOW (ref 4.22–5.81)
RDW: 17.7 % — ABNORMAL HIGH (ref 11.5–15.5)
WBC: 9 10*3/uL (ref 4.0–10.5)

## 2013-12-03 LAB — HIV ANTIBODY (ROUTINE TESTING W REFLEX): HIV: NONREACTIVE

## 2013-12-03 MED ORDER — SODIUM CHLORIDE 0.9 % IJ SOLN
10.0000 mL | INTRAMUSCULAR | Status: DC | PRN
  Administered 2013-12-03 – 2013-12-06 (×6): 10 mL

## 2013-12-03 MED ORDER — FAMOTIDINE 20 MG PO TABS
20.0000 mg | ORAL_TABLET | Freq: Two times a day (BID) | ORAL | Status: DC | PRN
  Administered 2013-12-03: 20 mg via ORAL
  Filled 2013-12-03: qty 1

## 2013-12-03 NOTE — Progress Notes (Signed)
Thomas Bean   DOB:[birth year]   QQ#:[id number]   JHE#:[id number]  Subjective: Patient reports pain level of 5 out of 10.  Family at bedside including his mother, father, brother and son.  Medical oncology timeline as noted below:    Squamous cell carcinoma of tongue   01/03/2011 Initial Diagnosis Squamous cell carcinoma of tongue   01/03/2011 Surgery Squamous cell carcinoma of the left lateral tongue s/p excision.    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 (Dr. Lucia Gaskins and Dr. Ernesto Rutherford)    07/27/2011 Pathology A  total 15 LN from left radical neck dissection that was all (-); the left submandibular gland mass showed a 3.5 cm invasive SCC, moderately differentiated involving the adjacent skeletal muscle tissue and bone extending margin; No invasion lymph or per   09/29/2011 Surgery He underwent further resection by Dr. Caryl Never at [facility]   09/29/2011 Pathology Mandible w L neck tissue, parital mandibulectomy: Inv SCC, Tumor size 2.6 cm; moderately to poorly differentiated (grade 3); mandibular bone invovled, extending to skeletal muscle. + perineural invasion; Neg lymphvascular Invasion. Pos margins.    11/15/2011 Concurrent Chemotherapy He received adj chemoradiation w q 3 wk cisplatin (3/5 - 12/05/11). Receive 2 of 3 doses, 3rd dose held due to febrile neutropenia, flap abscess.     11/15/2011 - 01/05/2012 Radiation Therapy He received concurrent XRT.    03/12/2012 Relapse/Recurrence He had right neck recurrence.   03/12/2012 Surgery He underwent a palliative right neck dissection by Dr. Owens Shark at [facility].    03/27/2012 Pathology Invasive moderately to poorly differentiate SCC w tumor invovling skeletal muscle.  Extensive perineural invasion identified. No LVI. No maligancy in 18 LN.    03/29/2012 Imaging CT Chest at [facility].  18 x 18 mm LUL lung nodule suspicious for metastases.   04/02/2012 Procedure  LLL biopsy.    04/02/2012 Pathology LLL biopsy c/w SCC.    04/25/2012 Cancer Staging PET/CT: 1.  Response to therapy within the head neck,  withotu evidence of residual hypermetabolic disease. Superior segment LLL cavitary hypermetabolic lung nodule.  This could represent an isolated metastasis ormetachronous primary.  L iliac bone met   04/25/2012 Progression Increased in size of lytic lesion in the posterior aspect of the L iliac bone.    05/15/2012 - 07/01/2013 Chemotherapy Started on palliative chemo weekly with carboplatin/taxol/erbitux (3 weeks on and 1 week off).  Due to prolonged cytopenia, his chemo was changed to weekly (2 weeks on, 1 weeek off)   07/05/2012 Imaging CT CAP: Interval decrease in size of left lower lobe cavitary massfollowing therapy.  Lytic lesion in the posterior aspect of the left iliac bone is increased size compared to prior.  This was hypermetabolic on comparison PET CT scan.   07/16/2012 -  Chemotherapy Zometa 4 mg IV every 4 weeks started given evidence of bone metastases.   08/25/2012 Procedure IR Gastrostomy Tube placed.    10/29/2012 Imaging CT CAP. 2.3 cm cavitary lesion in the left lower lobe, decreased. Mild nodularity along the left fissure, mildly increased. Attention on follow-up suggested.    03/01/2013 Imaging CT C/A/P. 1.  Enlargement of dominant cavitary left lower lobe lesion.  This could reflect metastatic squamous cell carcinoma or by a primary lung malignancy. 2.  No other evidence of thoracic metastatic disease.   05/16/2013 Imaging CT CAP: 4.3 x 3.2 cm cavitary lesion/metastasis in the superior segment left lower lobe, mildly increased. No evidence of  disease in abdomen or pelvis.     07/17/2013 - 07/30/2013 Radiation Therapy Palliative XRT to the left chest wall 30 gy per fraction x 10 fractions    08/22/2013 Imaging CT CAP: 1. Interval expansion of pleural-based lesion in the LLL with new this adjacent parenchymal nodules is most c/w progression of metastatic  carcinoma. 2. New right infrahilar low-density lymph node is concerning for new nodal mets   08/22/2013 Progression LLL pleural-based lesion increased in size.    09/02/2013 -  Chemotherapy Restarted on palliative chemo weekly with carboplatin/taxol/erbitux (2 weeks on, 1 weeek off). No active clinical trials at [facility] (Dr. Marcille Blanco).  if further progression, we will try MTX.   11/18/2013 Imaging CT C/A/P: Mixed response to therapy within the chest.  Overall progression. 2. The left upper lobe peripheral mass is slightly decreased in size. However, there is direct extension medially with contiguous left mediastinal and infrahilar tumor/adenopathy.   11/18/2013 Progression Progression with increased medial LAD and extension of pleural wall mass medially. Clinically patient is having a few more episodes of hemoptysis and more pain.     Objective:  Filed Vitals:   12/03/13 1622  BP:   Pulse:   Temp:   Resp: 9    Body mass index is 24.14 kg/(m^2).  Intake/Output Summary (Last 24 hours) at 12/03/13 1752 Last data filed at 12/03/13 1500  Gross per 24 hour  Intake   2480 ml  Output   2375 ml  Net    105 ml    S/p facial surgery flap, well healed  Sclerae unicteric  Oropharynx clear  Lungs w decreased breath sounds on the left  Heart regular rate and rhythm  Abdomen benign, + PEG  MSK no peripheral edema  Neuro nonfocal  Labs:  Lab Results  Component Value Date   WBC 9.0 12/03/2013   HGB 7.1* 12/03/2013   HCT 22.8* 12/03/2013   MCV 95.0 12/03/2013   PLT 299 12/03/2013   NEUTROABS 9.2* 9/73/5329    Basic Metabolic Panel:  Recent Labs Lab 12/02/13 1050 12/03/13 0520  NA 134* 135*  K 4.1 4.1  CL 94* 98  CO2 27 27  GLUCOSE 116* 112*  BUN 16 9  CREATININE 0.48* 0.42*  CALCIUM 9.0 8.4    CBC:  Recent Labs Lab 12/02/13 1050 12/03/13 0520  WBC 11.5* 9.0  NEUTROABS 9.2*  --   HGB 8.4* 7.1*  HCT 26.3* 22.8*  MCV 94.3 95.0  PLT 359 299   Thyroid function studies No  results found for this basename: TSH, T4TOTAL, FREET3, T3FREE, THYROIDAB,  in the last 72 hours Anemia work up No results found for this basename: VITAMINB12, FOLATE, FERRITIN, TIBC, IRON, RETICCTPCT,  in the last 72 hours Microbiology Recent Results (from the past 240 hour(s))  CULTURE, BLOOD (ROUTINE X 2)     Status: None   Collection Time    12/02/13 10:50 AM      Result Value Ref Range Status   Specimen Description BLOOD RIGHT HAND   Final   Special Requests BOTTLES DRAWN AEROBIC AND ANAEROBIC 3.5ML   Final   Culture  Setup Time     Final   Value: 12/02/2013 13:20     Performed at [facility]   Culture     Final   Value: GRAM POSITIVE COCCI IN CHAINS     Note: Gram Stain Report Called to,Read Back By and Verified With: ERICA PELLETIR AT 1:53 A.M. ON 12/03/13 [facility]  Performed at [facility]   Report Status PENDING   Incomplete  CULTURE, BLOOD (ROUTINE X 2)     Status: None   Collection Time    12/02/13 10:50 AM      Result Value Ref Range Status   Specimen Description BLOOD LEFT PORT   Final   Special Requests BOTTLES DRAWN AEROBIC AND ANAEROBIC 5CC   Final   Culture  Setup Time     Final   Value: 12/02/2013 13:21     Performed at [facility]   Culture     Final   Value:        BLOOD CULTURE RECEIVED NO GROWTH TO DATE CULTURE WILL BE HELD FOR 5 DAYS BEFORE ISSUING A FINAL NEGATIVE REPORT     Performed at [facility]   Report Status PENDING   Incomplete  URINE CULTURE     Status: None   Collection Time    12/02/13  1:30 PM      Result Value Ref Range Status   Specimen Description URINE, CLEAN CATCH   Final   Special Requests NONE   Final   Culture  Setup Time     Final   Value: 12/02/2013 22:18     Performed at [facility]     Final   Value: NO GROWTH     Performed at [facility]   Culture     Final   Value: NO GROWTH     Performed at [facility]   Report Status 12/03/2013 FINAL   Final       Studies:  Dg Chest 2 View  12/02/2013   CLINICAL DATA:  Lung cancer.  Fever pain.  EXAM: CHEST  2 VIEW  COMPARISON:  CT chest abdomen pelvis with contrast 11/18/2013 and CT chest 08/22/2013  FINDINGS: Left subclavian power Port-A-Cath terminates at the junction of superior vena cava and right atrium. Heart size is normal. There is left hilar prominence, similar compared to scout view of recent chest abdomen pelvis CT 11/18/2013. Large peripheral opacity in the superior segment of the left lower lobe corresponds in location to the patient's known left lung mass. The margins of the mass are ill-defined. Associated obstructive pneumonitis/infection cannot be excluded.  The right lung appears well expanded and clear. There are surgical clips at the thoracic inlet on the right. Negative for pleural effusion or pneumothorax. Thin peripheral calcifications in the left upper quadrant are known to be associated with a probably benign splenic lesion as described on prior CT. There are left axillary surgical clips. No acute or suspicious osseous lesion identified.  IMPRESSION: 1. Mass in the superior segment left upper lobe corresponds with known left lung mass is described on prior chest CT. The margins are ill defined, an associated postobstructive pneumonitis cannot be excluded. Number to the right lung is clear. 2. Persistent left hilar prominence likely reflects adenopathy. 3. Right lung is clear. 4. Satisfactory position Port-A-Cath.   Electronically Signed   By: Curlene Dolphin M.D.   On: 12/02/2013 11:37   Ct Angio Chest Pe W/cm &/or Wo Cm  12/02/2013   CLINICAL DATA:  Chest pain, cough and hemoptysis. 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 Evaluate for pulmonary embolus.  EXAM: CT ANGIOGRAPHY CHEST WITH CONTRAST  TECHNIQUE: Multidetector CT imaging of the chest was performed using the standard protocol during bolus administration of intravenous  contrast. Multiplanar CT  image reconstructions and MIPs were obtained to evaluate the vascular anatomy.  CONTRAST:  163m OMNIPAQUE IOHEXOL 350 MG/ML SOLN  COMPARISON:  Most recent prior chest CT 11/18/2013  FINDINGS: Mediastinum: Progressive interval enlargement of the left hilar confluent nodal mass. At the level of the left mainstem bronchus, the mass measures approximately 4.0 x 3.7 cm today compared to 3.5 x 3.2 cm previously. When measured along its longest axis, the mass measures 5.4 cm compared to 4.6 cm previously. Small right paratracheal lymph nodes are also slightly enlarged at 6 mm compared to 4 mm previously on image 24 of series 5. Unremarkable appearance of the esophagus. Calcified right aortic 0 pulmonary window noted.  Heart/Vascular: Adequate opacification of the pulmonary arteries to the proximal subsegmental level. Narrowing of the left upper lobe pulmonary artery secondary to mass effect from the left hilar mass. No focal filling defect to suggest acute pulmonary embolus. The heart is within normal limits for size. No pericardial effusion. Left IJ approach central venous catheter. The catheter tip terminates in the upper right atrium. Conventional 3 vessel arch. No aneurysmal dilatation or evidence of dissection.  Lungs/Pleura: Relatively mild paraseptal emphysema most notable in the lung apices. Interlobular septal thickening is noted in both upper lobes. The enlarging mass like consolidation extending from the left hilar mass to the pleural surface. Using a region of bridging soft tissue between the hila an the pleural based mass, the larger component of the pleural based mass measures approximately 4.4 x 4.0 cm today compared to 3.9 x 3.9 cm previously. Persistent irregular soft tissue protruding into the left mainstem bronchus resulting in worsened narrowing of the airway. The supplied left lower lobe bronchus is now almost completely obliterated. The segmental left lower lobe bronchi are  impacted with debris and there is new tree-in-bud micro nodularity in a peribronchovascular distribution throughout the associated left lower lobe particularly in the posterobasal and medial basal segments. Small left pleural effusion.  Bones/Soft Tissues: Irregular mottled appearance of the posterolateral aspect of the left fourth and fifth ribs in the region of the abutting pleural mass concerning for osseous invasion. No pathologic fracture.  Upper Abdomen: No new acute abnormality. Stable appearance of multi-septated calcified lesion in the posterior spleen.  Review of the MIP images confirms the above findings.  IMPRESSION: 1. Negative for acute pulmonary embolus. 2. Continued progression of metastatic disease compared to 11/18/2013. The pertinent findings on today's examination include progressive invasion and obstruction of the left lower lobe bronchus. This subtending segmental left lower lobe bronchi are impacted with debris and there is associated tree-in-bud micro nodularity in the posterior left lower lobe concerning for pneumonia versus obstructive pneumonitis. Additionally, there has been progressive narrowing of the left mainstem bronchus which may progress to complete obstruction in the near future. 3. Small left pleural effusion may be malignant or parapneumonic. 4. Additional ancillary findings as above.   Electronically Signed   By: HJacqulynn CadetM.D.   On: 12/02/2013 14:54    Assessment: 53y.o.  1. 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 2. Progression on palliative carboplatin/taxol/erbitux (05/15/2012 - 07/01/2013) and then restarted on 09/02/2013.   3. Progressive left chest wall pain secondary to #1 s/p palliative XRT (07/17/13 -07/30/13). 4. Hemoptysis secondary to # 1.  5. Post-obstructive pneumonia 6. DNR.  7. +PEG on osmolite 8. Anxiety 9. Insomnia 10. Anemia secondary #4.  We had an extensive 45 minute discussion regarding  his overall treatment over  the past several years.  Thomas Bean thought about his code status and determined he wanted to be DNR.  In addition, given his history, he requested more information about hospice.  He is starting to move toward focusing on quality of life measures.  They had questions about hospital beds and other logistics provided via hospice care.   RECOMMENDATIONS:  1. Agree with continuing broad spectrum antibiotics and possible tailor based on cultures.  2. Pain control with PCA.  Will calculate 24 hour dose if pain controlled and discharge with coverage. 3. Palliative/Hospice care discussion (ordered put in by me) 4. DNR (ordered put in by me) 5. Transfuse to maintain a hemoglobin of 8 given his hemoptysis.   We will follow this patient with you.  Plan discussed with Dr. Wyline Copas.  Jermika Olden, MD 12/03/2013  5:52 PM

## 2013-12-03 NOTE — Progress Notes (Signed)
[facility] PROGRESS NOTE  Thomas Bean NOB:[id number] DOB: 10-12-1960 DOA: 12/02/2013 PCP: Chancy Hurter, MD  Assessment/Plan: SIRS with Healthcare associated pneumonia  - Likely post obstructive PNA with fevers and presenting leukocytosis - Started empiric vancomycin and cefepime. dosing per pharmacy  - Follow blood cx. Sputum cx, urine for strep and legionella.  - 02 via  prn. Will add albuterol and Atrovent nebs prn  - Patient reports off-and-on hemoptysis which possibly due to lung metastases and underlying postobstructive pneumonia.  Hypertension  Controlled. 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 Juliann Mule - discussed with Dr. Juliann Mule this AM s/p headache neck dissection in 2012  Has bone and left lung metastases. Given palliative chemotherapy. Pain medications have been adjusted outpatient for progressive pain without much edema. He has a little progression of the disease on recent CT scan of the abdomen.  Patient being evaluated at [facility] for clinical trial involvement. Patient planned to be started on IV methotrexate.  -Currently on Dilaudid-PCA for pain. Continue fentanyl patch.  -was given Decadron and Neurontin  -continue PPI  Anemia  Hemoglobin currently stable  Diet:clear liquids with ensure and tube feeds at night  DVT prophylaxis:SCDs  Code Status: Full Family Communication: Pt and family in room Disposition Plan: pending   Consultants:  Oncology  Procedures:    Antibiotics:  Cefepime 12/02/13>>>  Vancomycin 12/02/13>>>  HPI/Subjective: Reports pain better controlled with PCA. No acute events noted overnight  Objective: Filed Vitals:   12/02/13 2352 12/03/13 0357 12/03/13 0527 12/03/13 0810  BP:   121/70   Pulse:   97   Temp:   98.1 F (36.7 C)   TempSrc:   Oral   Resp: 12 13 10 12   Height:      Weight:      SpO2: 95% 94% 95% 95%    Intake/Output Summary (Last  24 hours) at 12/03/13 1018 Last data filed at 12/03/13 1003  Gross per 24 hour  Intake   1400 ml  Output   2250 ml  Net   -850 ml   Filed Weights   12/02/13 1300 12/02/13 1327 12/02/13 2134  Weight: 78.7 kg (173 lb 8 oz) 78.926 kg (174 lb) 78.472 kg (173 lb)    Exam:   General:  Awake, in nad  Cardiovascular: regular, s1, s2  Respiratory: normal resp effort, no wheezing  Abdomen: soft, nondistended  Musculoskeletal: perfused, no clubbing or edema   Data Reviewed: Basic Metabolic Panel:  Recent Labs Lab 12/02/13 1050 12/03/13 0520  NA 134* 135*  K 4.1 4.1  CL 94* 98  CO2 27 27  GLUCOSE 116* 112*  BUN 16 9  CREATININE 0.48* 0.42*  CALCIUM 9.0 8.4   Liver Function Tests: No results found for this basename: AST, ALT, ALKPHOS, BILITOT, PROT, ALBUMIN,  in the last 168 hours No results found for this basename: LIPASE, AMYLASE,  in the last 168 hours No results found for this basename: AMMONIA,  in the last 168 hours CBC:  Recent Labs Lab 12/02/13 1050 12/03/13 0520  WBC 11.5* 9.0  NEUTROABS 9.2*  --   HGB 8.4* 7.1*  HCT 26.3* 22.8*  MCV 94.3 95.0  PLT 359 299   Cardiac Enzymes: No results found for this basename: CKTOTAL, CKMB, CKMBINDEX, TROPONINI,  in the last 168 hours BNP (last 3 results) No results found for this basename: PROBNP,  in the last 8760 hours CBG: No results found  for this basename: GLUCAP,  in the last 168 hours  Recent Results (from the past 240 hour(s))  CULTURE, BLOOD (ROUTINE X 2)     Status: None   Collection Time    12/02/13 10:50 AM      Result Value Ref Range Status   Specimen Description BLOOD RIGHT HAND   Final   Special Requests BOTTLES DRAWN AEROBIC AND ANAEROBIC 3.5ML   Final   Culture  Setup Time     Final   Value: 12/02/2013 13:20     Performed at [facility]   Culture     Final   Value: GRAM POSITIVE COCCI IN CHAINS     Note: Gram Stain Report Called to,Read Back By and Verified With: ERICA PELLETIR AT  1:53 A.M. ON 12/03/13 WARRB     Performed at [facility]   Report Status PENDING   Incomplete  CULTURE, BLOOD (ROUTINE X 2)     Status: None   Collection Time    12/02/13 10:50 AM      Result Value Ref Range Status   Specimen Description BLOOD LEFT PORT   Final   Special Requests BOTTLES DRAWN AEROBIC AND ANAEROBIC 5CC   Final   Culture  Setup Time     Final   Value: 12/02/2013 13:21     Performed at [facility]   Culture     Final   Value:        BLOOD CULTURE RECEIVED NO GROWTH TO DATE CULTURE WILL BE HELD FOR 5 DAYS BEFORE ISSUING A FINAL NEGATIVE REPORT     Performed at [facility]   Report Status PENDING   Incomplete     Studies: Dg Chest 2 View  12/02/2013   CLINICAL DATA:  Lung cancer.  Fever pain.  EXAM: CHEST  2 VIEW  COMPARISON:  CT chest abdomen pelvis with contrast 11/18/2013 and CT chest 08/22/2013  FINDINGS: Left subclavian power Port-A-Cath terminates at the junction of superior vena cava and right atrium. Heart size is normal. There is left hilar prominence, similar compared to scout view of recent chest abdomen pelvis CT 11/18/2013. Large peripheral opacity in the superior segment of the left lower lobe corresponds in location to the patient's known left lung mass. The margins of the mass are ill-defined. Associated obstructive pneumonitis/infection cannot be excluded.  The right lung appears well expanded and clear. There are surgical clips at the thoracic inlet on the right. Negative for pleural effusion or pneumothorax. Thin peripheral calcifications in the left upper quadrant are known to be associated with a probably benign splenic lesion as described on prior CT. There are left axillary surgical clips. No acute or suspicious osseous lesion identified.  IMPRESSION: 1. Mass in the superior segment left upper lobe corresponds with known left lung mass is described on prior chest CT. The margins are ill defined, an associated postobstructive  pneumonitis cannot be excluded. Number to the right lung is clear. 2. Persistent left hilar prominence likely reflects adenopathy. 3. Right lung is clear. 4. Satisfactory position Port-A-Cath.   Electronically Signed   By: Curlene Dolphin M.D.   On: 12/02/2013 11:37   Ct Angio Chest Pe W/cm &/or Wo Cm  12/02/2013   CLINICAL DATA:  Chest pain, cough and hemoptysis. 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 Evaluate for pulmonary embolus.  EXAM: CT ANGIOGRAPHY CHEST WITH CONTRAST  TECHNIQUE: Multidetector CT imaging of the chest was performed  using the standard protocol during bolus administration of intravenous contrast. Multiplanar CT image reconstructions and MIPs were obtained to evaluate the vascular anatomy.  CONTRAST:  142mL OMNIPAQUE IOHEXOL 350 MG/ML SOLN  COMPARISON:  Most recent prior chest CT 11/18/2013  FINDINGS: Mediastinum: Progressive interval enlargement of the left hilar confluent nodal mass. At the level of the left mainstem bronchus, the mass measures approximately 4.0 x 3.7 cm today compared to 3.5 x 3.2 cm previously. When measured along its longest axis, the mass measures 5.4 cm compared to 4.6 cm previously. Small right paratracheal lymph nodes are also slightly enlarged at 6 mm compared to 4 mm previously on image 24 of series 5. Unremarkable appearance of the esophagus. Calcified right aortic 0 pulmonary window noted.  Heart/Vascular: Adequate opacification of the pulmonary arteries to the proximal subsegmental level. Narrowing of the left upper lobe pulmonary artery secondary to mass effect from the left hilar mass. No focal filling defect to suggest acute pulmonary embolus. The heart is within normal limits for size. No pericardial effusion. Left IJ approach central venous catheter. The catheter tip terminates in the upper right atrium. Conventional 3 vessel arch. No aneurysmal dilatation or evidence of dissection.  Lungs/Pleura:  Relatively mild paraseptal emphysema most notable in the lung apices. Interlobular septal thickening is noted in both upper lobes. The enlarging mass like consolidation extending from the left hilar mass to the pleural surface. Using a region of bridging soft tissue between the hila an the pleural based mass, the larger component of the pleural based mass measures approximately 4.4 x 4.0 cm today compared to 3.9 x 3.9 cm previously. Persistent irregular soft tissue protruding into the left mainstem bronchus resulting in worsened narrowing of the airway. The supplied left lower lobe bronchus is now almost completely obliterated. The segmental left lower lobe bronchi are impacted with debris and there is new tree-in-bud micro nodularity in a peribronchovascular distribution throughout the associated left lower lobe particularly in the posterobasal and medial basal segments. Small left pleural effusion.  Bones/Soft Tissues: Irregular mottled appearance of the posterolateral aspect of the left fourth and fifth ribs in the region of the abutting pleural mass concerning for osseous invasion. No pathologic fracture.  Upper Abdomen: No new acute abnormality. Stable appearance of multi-septated calcified lesion in the posterior spleen.  Review of the MIP images confirms the above findings.  IMPRESSION: 1. Negative for acute pulmonary embolus. 2. Continued progression of metastatic disease compared to 11/18/2013. The pertinent findings on today's examination include progressive invasion and obstruction of the left lower lobe bronchus. This subtending segmental left lower lobe bronchi are impacted with debris and there is associated tree-in-bud micro nodularity in the posterior left lower lobe concerning for pneumonia versus obstructive pneumonitis. Additionally, there has been progressive narrowing of the left mainstem bronchus which may progress to complete obstruction in the near future. 3. Small left pleural effusion may be  malignant or parapneumonic. 4. Additional ancillary findings as above.   Electronically Signed   By: Jacqulynn Cadet M.D.   On: 12/02/2013 14:54    Scheduled Meds: . ceFEPime (MAXIPIME) IV  1 g Intravenous 3 times per day  . dexamethasone  2 mg Oral Daily  . feeding supplement (ENSURE COMPLETE)  237 mL Oral TID WC  . fentaNYL  25 mcg Transdermal Q72H  . gabapentin  300 mg Oral QAC breakfast  . gabapentin  600 mg Oral Q1200  . gabapentin  900 mg Oral QHS  . HYDROmorphone PCA 0.3 mg/mL  Intravenous 6 times per day  . ipratropium-albuterol  3 mL Nebulization TID  . pantoprazole  40 mg Oral Daily  . polyethylene glycol  17 g Oral Once per day on Mon Wed Fri  . sertraline  100 mg Oral Daily  . vancomycin  1,000 mg Intravenous Q8H   Continuous Infusions: . sodium chloride 75 mL/hr at 12/02/13 2208    Principal Problem:   Healthcare-associated pneumonia Active Problems:   HYPERTENSION   Squamous cell carcinoma of tongue   Anxiety   Bone metastasis   Cancer related pain   Metastatic carcinoma to lung   SIRS (systemic inflammatory response syndrome)   HCAP (healthcare-associated pneumonia)  Time spent: 2min  [facility], [facility] Pager [phone]. If 7PM-7AM, please contact night-coverage at [email or web address], password [facility] 12/03/2013, 10:18 AM  LOS: 1 day

## 2013-12-03 NOTE — Care Management Note (Addendum)
    Page 1 of 2   12/06/2013     2:54:36 PM   CARE MANAGEMENT NOTE 12/06/2013  Patient:  Thomas Bean, Thomas Bean   Account Number:  [id number]  Date Initiated:  12/03/2013  Documentation initiated by:  [facility]  Subjective/Objective Assessment:   53 Y/O M ADMITTED W/PNA.TK:PTWS CA,TONGUE CA,PEG-NOT USING.     Action/Plan:   FROM HOME.HAS PCP,PHARMACY.   Anticipated DC Date:  12/06/2013   Anticipated DC Plan:  [place]  CM consult      Choice offered to / List presented to:  C-1 Patient           Status of service:  Completed, signed off Medicare Important Message given?   (If response is "NO", the following Medicare IM given date fields will be blank) Date Medicare IM given:   Date Additional Medicare IM given:    Discharge Disposition:  [place]  Per UR Regulation:  Reviewed for med. necessity/level of care/duration of stay  If discussed at Long Length of Stay Meetings, dates discussed:    Comments:  12/06/13 Covey Baller RN,BSN NCM [phone] D/C [facility].  12/05/13 Beckey Polkowski RN,BSN NCM [place].PATIENT CHOSE [facility] FOR HOME.NOTED D/C PLAN FOR RESIDENTIAL FACILITY FOR IV PAIN CONTROL,THEN TRANSITION TO HOME W/HOSPICE SERVICES.[facility] FOLLOWING.PER ID-LONG TERM IV ABX.  12/04/13 Adithi Gammon RN,BSN NCM [phone] CURRENTLY MEDICAID NOT ACTIVE,HE IS WORKING WITH FINANCIAL COUNSELOR.MAY BENEFIT FROM PALLIATIVE CONS.PATIENT WOULD [facility] BED @ D/C.  12/03/13 Kharlie Bring RN,BSN NCM [phone] ED CM HAS ALREADY PROVIDED MEDS ASSIST RESOURCES(SEE CM ED NOTE).

## 2013-12-03 NOTE — Progress Notes (Signed)
Preliminary report of blood cultures called from [employer], [place].k. Baltazar Najjar, NP text paged if this information. Will continue to monitor.

## 2013-12-04 DIAGNOSIS — G893 Neoplasm related pain (acute) (chronic): Secondary | ICD-10-CM

## 2013-12-04 DIAGNOSIS — R7881 Bacteremia: Secondary | ICD-10-CM

## 2013-12-04 DIAGNOSIS — F411 Generalized anxiety disorder: Secondary | ICD-10-CM

## 2013-12-04 DIAGNOSIS — Z515 Encounter for palliative care: Secondary | ICD-10-CM

## 2013-12-04 LAB — CBC
HCT: 25.3 % — ABNORMAL LOW (ref 39.0–52.0)
Hemoglobin: 7.6 g/dL — ABNORMAL LOW (ref 13.0–17.0)
MCH: 28.7 pg (ref 26.0–34.0)
MCHC: 30 g/dL (ref 30.0–36.0)
MCV: 95.5 fL (ref 78.0–100.0)
Platelets: 346 K/uL (ref 150–400)
RBC: 2.65 MIL/uL — ABNORMAL LOW (ref 4.22–5.81)
RDW: 17.1 % — ABNORMAL HIGH (ref 11.5–15.5)
WBC: 8.2 K/uL (ref 4.0–10.5)

## 2013-12-04 LAB — VANCOMYCIN, TROUGH: Vancomycin Tr: 9.9 ug/mL — ABNORMAL LOW (ref 10.0–20.0)

## 2013-12-04 MED ORDER — SENNOSIDES-DOCUSATE SODIUM 8.6-50 MG PO TABS
2.0000 | ORAL_TABLET | Freq: Every evening | ORAL | Status: DC | PRN
  Administered 2013-12-05: 2 via ORAL
  Filled 2013-12-04 (×2): qty 2

## 2013-12-04 MED ORDER — HYDROMORPHONE 0.3 MG/ML IV SOLN
INTRAVENOUS | Status: DC
Start: 2013-12-04 — End: 2013-12-06
  Administered 2013-12-04: 5.2 mg via INTRAVENOUS
  Administered 2013-12-04: 21:00:00 via INTRAVENOUS
  Administered 2013-12-04: 0.51 mg via INTRAVENOUS
  Administered 2013-12-04: 15:00:00 via INTRAVENOUS
  Administered 2013-12-04: 10.35 mg via INTRAVENOUS
  Administered 2013-12-05: 19:00:00 via INTRAVENOUS
  Administered 2013-12-05: 3.83 mg via INTRAVENOUS
  Administered 2013-12-05: 7.47 mg via INTRAVENOUS
  Administered 2013-12-05: 3.3 mg via INTRAVENOUS
  Administered 2013-12-05: 1.96 mg via INTRAVENOUS
  Administered 2013-12-05: 8.06 mg via INTRAVENOUS
  Administered 2013-12-05: 7.99 mg via INTRAVENOUS
  Administered 2013-12-05: 08:00:00 via INTRAVENOUS
  Administered 2013-12-06: 6.51 mg via INTRAVENOUS
  Administered 2013-12-06: 11:00:00 via INTRAVENOUS
  Administered 2013-12-06: 3.97 mg via INTRAVENOUS
  Administered 2013-12-06 (×2): via INTRAVENOUS
  Filled 2013-12-04 (×9): qty 25

## 2013-12-04 MED ORDER — BIOTENE DRY MOUTH MT LIQD: 15.0000 mL | OROMUCOSAL | Status: DC | PRN

## 2013-12-04 MED ORDER — FAMOTIDINE 40 MG PO TABS
40.0000 mg | ORAL_TABLET | Freq: Two times a day (BID) | ORAL | Status: DC | PRN
  Filled 2013-12-04: qty 1

## 2013-12-04 MED ORDER — PIPERACILLIN-TAZOBACTAM 3.375 G IVPB
3.3750 g | Freq: Three times a day (TID) | INTRAVENOUS | Status: DC
  Administered 2013-12-05 – 2013-12-06 (×5): 3.375 g via INTRAVENOUS
  Filled 2013-12-04 (×7): qty 50

## 2013-12-04 MED ORDER — HYDROMORPHONE 0.3 MG/ML IV SOLN
INTRAVENOUS | Status: DC
  Administered 2013-12-04: 11:00:00 via INTRAVENOUS
  Filled 2013-12-04: qty 25

## 2013-12-04 MED ORDER — ALUM & MAG HYDROXIDE-SIMETH 200-200-20 MG/5ML PO SUSP: 30.0000 mL | Freq: Four times a day (QID) | ORAL | Status: DC | PRN

## 2013-12-04 MED ORDER — FAMOTIDINE 20 MG PO TABS
20.0000 mg | ORAL_TABLET | ORAL | Status: AC
  Administered 2013-12-04: 20 mg via ORAL
  Filled 2013-12-04: qty 1

## 2013-12-04 MED ORDER — PANTOPRAZOLE SODIUM 40 MG PO TBEC
40.0000 mg | DELAYED_RELEASE_TABLET | Freq: Two times a day (BID) | ORAL | Status: DC
  Administered 2013-12-05 – 2013-12-06 (×3): 40 mg via ORAL
  Filled 2013-12-04 (×7): qty 1

## 2013-12-04 MED ORDER — VANCOMYCIN HCL 10 G IV SOLR
1250.0000 mg | Freq: Three times a day (TID) | INTRAVENOUS | Status: DC
  Administered 2013-12-04 – 2013-12-05 (×5): 1250 mg via INTRAVENOUS
  Filled 2013-12-04 (×6): qty 1250

## 2013-12-04 MED ORDER — SUCRALFATE 1 GM/10ML PO SUSP
1.0000 g | Freq: Three times a day (TID) | ORAL | Status: DC
  Administered 2013-12-04 – 2013-12-06 (×7): 1 g via ORAL
  Filled 2013-12-04 (×10): qty 10

## 2013-12-04 NOTE — Progress Notes (Signed)
ANTIBIOTIC CONSULT NOTE - INITIAL  Pharmacy Consult for Vancomycin, Cefepime Indication: HCAP  Allergies  Allergen Reactions  . Morphine And Related Nausea And Vomiting  . Tape Other (See Comments)    Blisters; please use paper tape  . Codeine Phosphate Swelling and Rash    Patient Measurements: Height: 5\' 11"  (180.3 cm) Weight: 173 lb (78.472 kg) (Pt. refused bed wt due to repositioning/pain.) IBW/kg (Calculated) : 75.3 Adjusted Body Weight:   Vital Signs: Temp: 97.6 F (36.4 C) (03/25 0605) Temp src: Oral (03/25 0605) BP: 128/84 mmHg (03/25 0605) Pulse Rate: 93 (03/25 0605) Intake/Output from previous day: 03/24 0701 - 03/25 0700 In: 2390 [P.O.:720; I.V.:1170; IV Piggyback:500] Out: 3250 [Urine:3250] Intake/Output from this shift:    Labs:  Recent Labs  12/02/13 1050 12/03/13 0520 12/04/13 0450  WBC 11.5* 9.0 8.2  HGB 8.4* 7.1* 7.6*  PLT 359 299 346  CREATININE 0.48* 0.42*  --    Estimated Creatinine Clearance: 112.4 ml/min (by C-G formula based on Cr of 0.42).  Recent Labs  12/04/13 0450  VANCOTROUGH 9.9*     Microbiology: Recent Results (from the past 720 hour(s))  CULTURE, BLOOD (ROUTINE X 2)     Status: None   Collection Time    12/02/13 10:50 AM      Result Value Ref Range Status   Specimen Description BLOOD RIGHT HAND   Final   Special Requests BOTTLES DRAWN AEROBIC AND ANAEROBIC 3.5ML   Final   Culture  Setup Time     Final   Value: 12/02/2013 13:20     Performed at [facility]   Culture     Final   Value: GRAM POSITIVE COCCI IN CHAINS     Note: Gram Stain Report Called to,Read Back By and Verified With: ERICA PELLETIR AT 1:53 A.M. ON 12/03/13 WARRB     Performed at [facility]   Report Status PENDING   Incomplete  CULTURE, BLOOD (ROUTINE X 2)     Status: None   Collection Time    12/02/13 10:50 AM      Result Value Ref Range Status   Specimen Description BLOOD LEFT PORT   Final   Special Requests BOTTLES DRAWN AEROBIC  AND ANAEROBIC 5CC   Final   Culture  Setup Time     Final   Value: 12/02/2013 13:21     Performed at [facility]   Culture     Final   Value:        BLOOD CULTURE RECEIVED NO GROWTH TO DATE CULTURE WILL BE HELD FOR 5 DAYS BEFORE ISSUING A FINAL NEGATIVE REPORT     Performed at [facility]   Report Status PENDING   Incomplete  URINE CULTURE     Status: None   Collection Time    12/02/13  1:30 PM      Result Value Ref Range Status   Specimen Description URINE, CLEAN CATCH   Final   Special Requests NONE   Final   Culture  Setup Time     Final   Value: 12/02/2013 22:18     Performed at [place]     Final   Value: NO GROWTH     Performed at [facility]   Culture     Final   Value: NO GROWTH     Performed at [facility]   Report Status 12/03/2013 FINAL   Final    Medical History:  Past Medical History  Diagnosis Date  . Hemorrhoids   . Hypertension     no medications at present  . GERD (gastroesophageal reflux disease)     no medication  . Depression     treated approx 12 years ago, no treatment at this time  . Hiatal hernia   . Lesion of left lung 03/14/12    CT chest, [facility], squamous cell carcinoma  . Facial rash 05/24/2012  . Tongue cancer 12/2010    left side of tongue T1 lesion excised 4/12  . Lung metastasis dx'd 03/2012  . Cancer 08/2011    oral tongue  . Hx of radiation therapy 07/24/12- 07-26-12    left iliac bone, 18 Gy 3 sessions  . Hx of radiation therapy 11/15/11- 01/05/12    left neck, oral cavity6600 cGy 33 sessions  . Bone metastasis 04/25/12    PET scan-left iliac bone  . Hx antineoplastic chemotherapy 2013    Carboplatin/Taxol/Erbitux  . S/P radiation therapy 07/17/2013-07/30/2013     Left chest was treated to a total dose of 30 gray in 10 fractions at 3 gray per fraction   Assessment: 53yoM presents with SOB and fever.  PMHx significant for recurrent, metastatic, tongue squamous cell carcinoma  on palliative chemotherapy. CXR shows possible postobstructive pneumonia.  Pharmacy consulted to dose Vancomycin and Cefepime for HCAP.    No antibiotic allergies noted  D3 Antibiotics 3/23 >> Ceftriaxone x1 >> 3/23 3/23 >> Azithromycin x1 >> 3/23 3/23 >> Vancomycin  >> 3/23 >> Cefepime  >>    Tmax: now AF WBCs:WNL Renal:SCr stable, CrCl >100 N/CG (using rounded 0.8)  3/23 blood: 1/2 GPC in chains 3/23 urine: NGF  Vancomycin trough this morning low at 9.9 mcg/ml  Goal of Therapy:  Vancomycin trough level 15-20 mcg/ml  Plan:  Increase vancomycin to 1250mg  IV q8h Continue Cefepime 1g IV q8h F/u renal function, cultures, clinical course  Ralene Bathe, PharmD, BCPS 12/04/2013, 7:27 AM  Pager: [phone]

## 2013-12-04 NOTE — Consult Note (Signed)
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   Consult is for review of medical treatment options, clarification of goals of care and end of life issues, disposition and options, and symptom recommendation.  This NP Wadie Lessen reviewed medical records, received report from team, assessed the patient and then meet at the patient's bedside along with his parents and brother and son Thomas Bean)  to discuss diagnosis prognosis, [place], EOL wishes disposition and options.   A detailed discussion was had today regarding advanced directives.  Concepts specific to code status, artifical feeding and hydration, continued IV antibiotics and rehospitalization was had.  The difference between a aggressive medical intervention path  and a palliative comfort care path for this patient at this time was had.  Values and goals of care important to patient and family were attempted to be elicited.  Concept of Hospice and Palliative Care were discussed  Detailed conversation had with patient and his family regarding concepts of hope and acceptance.  All express gratitude for the opportunity to discuss and process this very difficult situation.  A counselor from his church plans to visit in the hospital for continued emotional support  Natural trajectory and expectations at EOL were discussed.  Questions and concerns addressed.  Hard Choices booklet left for review. Family encouraged to call with questions or concerns.  PMT will continue to support holistically.   Goals of Care: 1.  Code Status:  DNR/DNI   2. Scope of Treatment: 1.  Patietn is hopeful to improve funtionally and have his pain controlled better to be able to "go home".  4. Disposition:  Hopeful for home with hospice benefit   3. Symptom Management:   1. Anxiety/Agitation: Ativan 2. Pain:  Dialudid PCA  Settings:                                             Basal rate 0.25 mg/hr                                             Dose: 0.5 mg every 12 minutes prn/lockout 12 mg         Fentanyl patch 25 mcg remains-will dc in am after evaluation of pain control 3.  Anxiety: Ativan 0.5 mg every 6 hrs prn                                3. Bowel Regimen: Senna s-two tablets po qhs  4. Psychosocial:  Emotional support offered to patient and family  5. 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   -Continued physical and functional decline   ROS: pain centered Left shoulder/back/arm/chest     PMH:  Past Medical History  Diagnosis Date  . Hemorrhoids   . Hypertension     no medications at present  . GERD (gastroesophageal reflux disease)     no medication  . Depression     treated approx 12 years ago, no treatment at this time  . Hiatal hernia   . Lesion of left lung 03/14/12    CT chest, [facility], squamous cell carcinoma  . Facial rash 05/24/2012  . Tongue cancer 12/2010    left side of tongue T1 lesion excised 4/12  . Lung metastasis dx'd 03/2012  . Cancer 08/2011    oral tongue  . Hx of radiation therapy 07/24/12- 07-26-12    left iliac bone, 18 Gy 3 sessions  . Hx of radiation therapy 11/15/11- 01/05/12    left neck, oral cavity6600 cGy 33 sessions  . Bone metastasis 04/25/12    PET scan-left iliac bone  . Hx  antineoplastic chemotherapy 2013    Carboplatin/Taxol/Erbitux  . S/P radiation therapy 07/17/2013-07/30/2013     Left chest was treated to a total dose of 30 gray in 10 fractions at 3 gray per fraction     PSH: Past Surgical History  Procedure Laterality Date  . Inguinal herniorrhapy    . Tonsillectomy and adenoidectomy    . Excision of tongue mass      April 2012  . Hernia repair    . Neck mass excision  07/27/2011  . Radical neck dissection  07/27/2011    Procedure: RADICAL NECK DISSECTION;  Surgeon: Rozetta Nunnery, MD;  Location: [facility];  Service: ENT;  Laterality: Left;  Marland Kitchen Mandibulectomy  09/2011    and scapular free flap  . Modified radical neck dissection  03/27/12    excision of lip lesion, lip benign, right neck squamous cell ca   I have reviewed the [place] and SH and  If appropriate update it with new information. Allergies  Allergen Reactions  . Morphine And Related Nausea And Vomiting  . Tape Other (See Comments)    Blisters; please use paper tape  . Codeine Phosphate Swelling and Rash   Scheduled Meds: . ceFEPime (MAXIPIME) IV  1 g Intravenous 3 times per day  . dexamethasone  2 mg Oral Daily  . feeding supplement (ENSURE COMPLETE)  237 mL Oral TID WC  . fentaNYL  25 mcg Transdermal Q72H  . gabapentin  300 mg Oral QAC breakfast  . gabapentin  600 mg Oral Q1200  . gabapentin  900 mg Oral QHS  . HYDROmorphone PCA 0.3 mg/mL   Intravenous 6 times per day  . ipratropium-albuterol  3 mL Nebulization TID  . pantoprazole  40 mg Oral Daily  . polyethylene glycol  17 g Oral Once per day on Mon Wed Fri  . sertraline  100 mg Oral Daily  . vancomycin  1,250 mg Intravenous Q8H   Continuous Infusions: . sodium chloride 75 mL/hr at 12/04/13 0548   PRN Meds:.albuterol, diphenhydrAMINE, diphenhydrAMINE, famotidine, LORazepam, naloxone, prochlorperazine, sodium chloride, sodium chloride    BP 128/84  Pulse 93  Temp(Src) 97.6 F (36.4 C) (Oral)  Resp 13  Ht 5\' 11"   (1.803 m)  Wt 78.472 kg (173 lb)  BMI 24.14 kg/m2  SpO2 95%   PPS: 40 % at best   Intake/Output Summary (Last 24 hours) at 12/04/13 1252 Last data filed at 12/04/13 0644  Gross per 24 hour  Intake   3130 ml  Output   2100 ml  Net   1030 ml   Physical Exam:  General: ill appearing, NAD, OOB to chair HEENT:  Mm, no exudate-noted facial abnormality secondary to surgery Chest:   CTA CVS: RRR Abdomen: soft NT +BS, Peg noted Ext: BLE +1 edema Neuro: alert and oriented X3   Labs: CBC    Component Value Date/Time   WBC 8.2 12/04/2013 0450   WBC 7.4 11/26/2013 0828   RBC 2.65* 12/04/2013 0450   RBC 3.00* 11/26/2013 0828   HGB 7.6* 12/04/2013 0450   HGB 9.1* 11/26/2013 0828   HCT 25.3* 12/04/2013 0450   HCT 27.8* 11/26/2013 0828   PLT 346 12/04/2013 0450   PLT 323 11/26/2013 0828   MCV 95.5 12/04/2013 0450   MCV 92.4 11/26/2013 0828   MCH 28.7 12/04/2013 0450   MCH 30.3 11/26/2013 0828   MCHC 30.0 12/04/2013 0450   MCHC 32.8 11/26/2013 0828   RDW 17.1* 12/04/2013 0450   RDW 19.0* 11/26/2013 0828   LYMPHSABS 1.0 12/02/2013 1050   LYMPHSABS 0.6* 11/26/2013 0828   MONOABS 1.0 12/02/2013 1050   MONOABS 0.5 11/26/2013 0828   EOSABS 0.2 12/02/2013 1050   EOSABS 0.4 11/26/2013 0828   BASOSABS 0.0 12/02/2013 1050   BASOSABS 0.0 11/26/2013 0828    BMET    Component Value Date/Time   NA 135* 12/03/2013 0520   NA 139 11/26/2013 0830   K 4.1 12/03/2013 0520   K 4.0 11/26/2013 0830   CL 98 12/03/2013 0520   CL 102 02/25/2013 0753   CO2 27 12/03/2013 0520   CO2 28 11/26/2013 0830   GLUCOSE 112* 12/03/2013 0520   GLUCOSE 141* 11/26/2013 0830   GLUCOSE 86 02/25/2013 0753   BUN 9 12/03/2013 0520   BUN 11.9 11/26/2013 0830   CREATININE 0.42* 12/03/2013 0520   CREATININE 0.7 11/26/2013 0830   CALCIUM 8.4 12/03/2013 0520   CALCIUM 9.8 11/26/2013 0830   GFRNONAA >90 12/03/2013 0520   GFRAA >90 12/03/2013 0520    CMP     Component Value Date/Time   NA 135* 12/03/2013 0520   NA 139 11/26/2013 0830   K 4.1  12/03/2013 0520   K 4.0 11/26/2013 0830   CL 98 12/03/2013 0520   CL 102 02/25/2013 0753   CO2 27 12/03/2013 0520   CO2 28 11/26/2013 0830   GLUCOSE 112* 12/03/2013 0520   GLUCOSE 141* 11/26/2013 0830   GLUCOSE 86 02/25/2013 0753   BUN 9 12/03/2013 0520   BUN 11.9 11/26/2013 0830   CREATININE 0.42* 12/03/2013 0520   CREATININE 0.7 11/26/2013 0830   CALCIUM 8.4 12/03/2013 0520   CALCIUM 9.8 11/26/2013 0830   PROT 7.5 11/26/2013 0830   PROT 6.8 05/03/2012 0905   ALBUMIN 3.0* 11/26/2013 0830   ALBUMIN 3.8 05/03/2012 0905   AST 16 11/26/2013 0830   AST 14 05/03/2012 0905   ALT 17 11/26/2013 0830   ALT 11 05/03/2012 0905   ALKPHOS 69 11/26/2013 0830   ALKPHOS 54 05/03/2012 0905   BILITOT 0.36 11/26/2013 0830   BILITOT 0.3 05/03/2012 0905   GFRNONAA >90 12/03/2013 0520   GFRAA >90 12/03/2013 0520     CT scan of the Chest 1. Negative for acute pulmonary embolus.  2. Continued progression of metastatic  disease compared to 11/18/2013.  The pertinent findings on today's examination include progressive  invasion and obstruction of the left lower lobe bronchus. This  subtending segmental left lower lobe bronchi are impacted with  debris and there is associated tree-in-bud micro nodularity in the  posterior left lower lobe concerning for pneumonia versus  obstructive pneumonitis. Additionally, there has been progressive  narrowing of the left mainstem bronchus which may progress to  complete obstruction in the near future.  3. Small left pleural effusion may be malignant or parapneumonic.  4. Additional ancillary findings as above.     Time In Time Out Total Time Spent with Patient Total Overall Time  1045 1215      Greater than 50%  of this time was spent counseling and coordinating care related to the above assessment and plan.  Wadie Lessen NP  Palliative Medicine Team Team Phone # [phone] Pager [phone]  Discussed with Dr Juliann Mule

## 2013-12-04 NOTE — Progress Notes (Signed)
ANTIBIOTIC CONSULT NOTE - FOLLOW UP  Pharmacy Consult for Vancomycin, Zosyn Indication: pneumonia  Allergies  Allergen Reactions  . Morphine And Related Nausea And Vomiting  . Tape Other (See Comments)    Blisters; please use paper tape  . Codeine Phosphate Swelling and Rash    Patient Measurements: Height: 5\' 11"  (180.3 cm) Weight: 173 lb (78.472 kg) (Pt. refused bed wt due to repositioning/pain.) IBW/kg (Calculated) : 75.3  Vital Signs: Temp: 98.1 F (36.7 C) (03/25 1419) Temp src: Oral (03/25 1419) BP: 101/60 mmHg (03/25 1419) Pulse Rate: 104 (03/25 1419) Intake/Output from previous day: 03/24 0701 - 03/25 0700 In: 3370 [P.O.:840; I.V.:1780; IV Piggyback:750] Out: 3250 [Urine:3250]  Labs:  Recent Labs  12/02/13 1050 12/03/13 0520 12/04/13 0450  WBC 11.5* 9.0 8.2  HGB 8.4* 7.1* 7.6*  PLT 359 299 346  CREATININE 0.48* 0.42*  --    Estimated Creatinine Clearance: 112.4 ml/min (by C-G formula based on Cr of 0.42).  Recent Labs  12/04/13 0450  VANCOTROUGH 9.9*     Microbiology: Recent Results (from the past 720 hour(s))  CULTURE, BLOOD (ROUTINE X 2)     Status: None   Collection Time    12/02/13 10:50 AM      Result Value Ref Range Status   Specimen Description BLOOD RIGHT HAND   Final   Special Requests BOTTLES DRAWN AEROBIC AND ANAEROBIC 3.5ML   Final   Culture  Setup Time     Final   Value: 12/02/2013 13:20     Performed at [facility]   Culture     Final   Value: GRAM POSITIVE COCCI IN CHAINS     Note: Gram Stain Report Called to,Read Back By and Verified With: ERICA PELLETIR AT 1:53 A.M. ON 12/03/13 WARRB     Performed at [facility]   Report Status PENDING   Incomplete  CULTURE, BLOOD (ROUTINE X 2)     Status: None   Collection Time    12/02/13 10:50 AM      Result Value Ref Range Status   Specimen Description BLOOD LEFT PORT   Final   Special Requests BOTTLES DRAWN AEROBIC AND ANAEROBIC 5CC   Final   Culture  Setup Time      Final   Value: 12/02/2013 13:21     Performed at [facility]   Culture     Final   Value:        BLOOD CULTURE RECEIVED NO GROWTH TO DATE CULTURE WILL BE HELD FOR 5 DAYS BEFORE ISSUING A FINAL NEGATIVE REPORT     Performed at [facility]   Report Status PENDING   Incomplete  URINE CULTURE     Status: None   Collection Time    12/02/13  1:30 PM      Result Value Ref Range Status   Specimen Description URINE, CLEAN CATCH   Final   Special Requests NONE   Final   Culture  Setup Time     Final   Value: 12/02/2013 22:18     Performed at [facility]     Final   Value: NO GROWTH     Performed at [facility]   Culture     Final   Value: NO GROWTH     Performed at [facility]   Report Status 12/03/2013 FINAL   Final    Anti-infectives: 3/23 >> Ceftriaxone x1 3/23 >> Azithromycin x1 3/23 >>  Vancomycin >> 3/23 >> Cefepime >> 3/25 3/25 >> Zosyn >>  Assessment: 53yoM presents with SOB and fever. 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 CXR shows possible postobstructive pneumonia. Pharmacy consulted to dose Vancomycin and Cefepime initially, but cefepime changed to Zosyn on 3/25.  Tmax: AF now  WBCs: decreased to wnl  Renal: SCr 0.42, CrCl >100 ml/min (using rounded SCr 0.8)  Blood cxt: 1/2 GPC in chains, f/u identification and repeat cultures   Goal of Therapy:  Vancomycin trough level 15-20 mcg/ml Appropriate abx dosing, eradication of infection.   Plan:   Zosyn 3.375g IV Q8H infused over 4hrs.  Continue Vancomycin 1250mg  IV q8h.  Measure Vanc trough at steady state.  Follow up renal fxn and culture results.   Gretta Arab PharmD, BCPS Pager [phone] 12/04/2013 8:07 PM

## 2013-12-04 NOTE — Progress Notes (Signed)
For the second time tonight , the patient has c/o a burning/aching stomach. Medication was administered earlier tonight. Now patient is sitting in a recliner,still c/o stomach pain. PCP on call was notified.  Will continue to monitor the patient.

## 2013-12-04 NOTE — Progress Notes (Signed)
[facility] PROGRESS NOTE  Thomas Bean GNO:[id number] DOB: 1961/02/05 DOA: 12/02/2013 PCP: Chancy Hurter, MD  Assessment/Plan    GPC in chains from 1/2 blood cultures, -  Repeat BCx in AM -  Continue vancomycin -  May need ECHO depending on speciation   SIRS with Healthcare associated pneumonia  - Likely post obstructive PNA with fevers and presenting leukocytosis  - Continue empiric vancomycin - change cefepime to zosyn - Follow blood cx. Sputum cx -  urine for strep and legionella pending - 02 via [place] prn.  -  Continue albuterol and Atrovent nebs prn  - Patient reports off-and-on hemoptysis which possibly due to lung metastases and underlying postobstructive pneumonia.   Hypertension  Controlled. Cont current regimen   Squamous cell carcinoma of the tongue with lung and bony metastases  - follows with Dr. Juliann Mule - discussed with Dr. Juliann Mule this AM  s/p headache neck dissection in 2012   Has bone and left lung metastases. Given palliative chemotherapy. Pain medications have been adjusted outpatient for progressive pain without much edema. He has a little progression of the disease on recent CT scan of the abdomen.  Patient being evaluated at [facility] for clinical trial involvement. Patient planned to be started on IV methotrexate but not a good candidate at this time due to infection -Dilaudid-PCA for pain - consider d/c home with PCA? - continue Decadron and Neurontin   Chest pain he is attributing to hiatal hernia, although I am suspicious that this is related to his malignancy  - increase PPI and start carafate before meals  Anemia  -  Hemoglobin in mid-7 range due to malignancy and chemotherapy -  Transfuse to hemoglobin > 8 prior to discharge   Diet:  FLD Access:  port IVF:  IVF Proph:  SCDs  Code Status: DNR Family Communication: paitnet and parents Disposition Plan: continue abx and narrow to clindamycin potentially at discharge.  Consider d/c on  dilaudid PCA or possible transfer to inpatient hospice for a few days to get pain controlled before discharging home.  Appreciate palliative care assistance   Consultants:  Oncology Procedures:  Antibiotics:  Cefepime 12/02/13>>>  Vancomycin 12/02/13>>>   HPI/Subjective:  Did not sleep last night due to pain.  Breathing is about the same.  Severe left chest pain.  Not eating because this worsens his symptoms  Objective: Filed Vitals:   12/04/13 1120 12/04/13 1240 12/04/13 1419 12/04/13 1501  BP:   101/60   Pulse:   104   Temp:   98.1 F (36.7 C)   TempSrc:   Oral   Resp: 13 12 14 16   Height:      Weight:      SpO2: 95% 88% 86% 92%    Intake/Output Summary (Last 24 hours) at 12/04/13 1856 Last data filed at 12/04/13 1700  Gross per 24 hour  Intake 2153.75 ml  Output   2125 ml  Net  28.75 ml   Filed Weights   12/02/13 1300 12/02/13 1327 12/02/13 2134  Weight: 78.7 kg (173 lb 8 oz) 78.926 kg (174 lb) 78.472 kg (173 lb)    Exam:   General:  Cachectic CM, No acute distress  HEENT:  NCAT, MMM  Cardiovascular:  RRR, nl S1, S2 no mrg, 2+ pulses, warm extremities  Respiratory:  Absent BS left side, rales at right base, no wheeze or rhonchi, no increased WOB  Abdomen:   NABS, soft, NT/ND, PEG tube in place  MSK:   Normal tone and  bulk, 1+ bilateral LEE  Neuro:  Grossly intact  Data Reviewed: Basic Metabolic Panel:  Recent Labs Lab 12/02/13 1050 12/03/13 0520  NA 134* 135*  K 4.1 4.1  CL 94* 98  CO2 27 27  GLUCOSE 116* 112*  BUN 16 9  CREATININE 0.48* 0.42*  CALCIUM 9.0 8.4   Liver Function Tests: No results found for this basename: AST, ALT, ALKPHOS, BILITOT, PROT, ALBUMIN,  in the last 168 hours No results found for this basename: LIPASE, AMYLASE,  in the last 168 hours No results found for this basename: AMMONIA,  in the last 168 hours CBC:  Recent Labs Lab 12/02/13 1050 12/03/13 0520 12/04/13 0450  WBC 11.5* 9.0 8.2  NEUTROABS 9.2*  --   --    HGB 8.4* 7.1* 7.6*  HCT 26.3* 22.8* 25.3*  MCV 94.3 95.0 95.5  PLT 359 299 346   Cardiac Enzymes: No results found for this basename: CKTOTAL, CKMB, CKMBINDEX, TROPONINI,  in the last 168 hours BNP (last 3 results) No results found for this basename: PROBNP,  in the last 8760 hours CBG: No results found for this basename: GLUCAP,  in the last 168 hours  Recent Results (from the past 240 hour(s))  CULTURE, BLOOD (ROUTINE X 2)     Status: None   Collection Time    12/02/13 10:50 AM      Result Value Ref Range Status   Specimen Description BLOOD RIGHT HAND   Final   Special Requests BOTTLES DRAWN AEROBIC AND ANAEROBIC 3.5ML   Final   Culture  Setup Time     Final   Value: 12/02/2013 13:20     Performed at [facility]   Culture     Final   Value: GRAM POSITIVE COCCI IN CHAINS     Note: Gram Stain Report Called to,Read Back By and Verified With: ERICA PELLETIR AT 1:53 A.M. ON 12/03/13 WARRB     Performed at [facility]   Report Status PENDING   Incomplete  CULTURE, BLOOD (ROUTINE X 2)     Status: None   Collection Time    12/02/13 10:50 AM      Result Value Ref Range Status   Specimen Description BLOOD LEFT PORT   Final   Special Requests BOTTLES DRAWN AEROBIC AND ANAEROBIC 5CC   Final   Culture  Setup Time     Final   Value: 12/02/2013 13:21     Performed at [facility]   Culture     Final   Value:        BLOOD CULTURE RECEIVED NO GROWTH TO DATE CULTURE WILL BE HELD FOR 5 DAYS BEFORE ISSUING A FINAL NEGATIVE REPORT     Performed at [facility]   Report Status PENDING   Incomplete  URINE CULTURE     Status: None   Collection Time    12/02/13  1:30 PM      Result Value Ref Range Status   Specimen Description URINE, CLEAN CATCH   Final   Special Requests NONE   Final   Culture  Setup Time     Final   Value: 12/02/2013 22:18     Performed at [facility]     Final   Value: NO GROWTH     Performed at [facility]   Culture     Final   Value: NO GROWTH     Performed at [facility]  Report Status 12/03/2013 FINAL   Final     Studies: No results found.  Scheduled Meds: . ceFEPime (MAXIPIME) IV  1 g Intravenous 3 times per day  . dexamethasone  2 mg Oral Daily  . feeding supplement (ENSURE COMPLETE)  237 mL Oral TID WC  . fentaNYL  25 mcg Transdermal Q72H  . gabapentin  300 mg Oral QAC breakfast  . gabapentin  600 mg Oral Q1200  . gabapentin  900 mg Oral QHS  . HYDROmorphone PCA 0.3 mg/mL   Intravenous 6 times per day  . ipratropium-albuterol  3 mL Nebulization TID  . pantoprazole  40 mg Oral Daily  . polyethylene glycol  17 g Oral Once per day on Mon Wed Fri  . sertraline  100 mg Oral Daily  . vancomycin  1,250 mg Intravenous Q8H   Continuous Infusions: . sodium chloride 75 mL/hr at 12/04/13 6213    Principal Problem:   Healthcare-associated pneumonia Active Problems:   HYPERTENSION   Squamous cell carcinoma of tongue   Anxiety   Bone metastasis   Cancer related pain   Metastatic carcinoma to lung   SIRS (systemic inflammatory response syndrome)   HCAP (healthcare-associated pneumonia)   Palliative care encounter   Cancer associated pain   Anxiety state, unspecified    Time spent: 30 min    Sholanda Croson, [facility] Pager [phone]. If 7PM-7AM, please contact night-coverage at [email or web address], password [facility] 12/04/2013, 6:56 PM  LOS: 2 days

## 2013-12-05 DIAGNOSIS — R7881 Bacteremia: Secondary | ICD-10-CM

## 2013-12-05 DIAGNOSIS — B958 Unspecified staphylococcus as the cause of diseases classified elsewhere: Secondary | ICD-10-CM

## 2013-12-05 DIAGNOSIS — L0291 Cutaneous abscess, unspecified: Secondary | ICD-10-CM

## 2013-12-05 DIAGNOSIS — L0390 Cellulitis, unspecified: Secondary | ICD-10-CM

## 2013-12-05 DIAGNOSIS — Z85810 Personal history of malignant neoplasm of tongue: Secondary | ICD-10-CM

## 2013-12-05 LAB — CBC
HEMATOCRIT: 22.4 % — AB (ref 39.0–52.0)
HEMOGLOBIN: 6.8 g/dL — AB (ref 13.0–17.0)
MCH: 28.9 pg (ref 26.0–34.0)
MCHC: 30.4 g/dL (ref 30.0–36.0)
MCV: 95.3 fL (ref 78.0–100.0)
Platelets: 321 10*3/uL (ref 150–400)
RBC: 2.35 MIL/uL — AB (ref 4.22–5.81)
RDW: 17.4 % — ABNORMAL HIGH (ref 11.5–15.5)
WBC: 8 10*3/uL (ref 4.0–10.5)

## 2013-12-05 LAB — HEMOGLOBIN AND HEMATOCRIT, BLOOD
HEMATOCRIT: 26.8 % — AB (ref 39.0–52.0)
Hemoglobin: 8.5 g/dL — ABNORMAL LOW (ref 13.0–17.0)

## 2013-12-05 LAB — BASIC METABOLIC PANEL
BUN: 12 mg/dL (ref 6–23)
CHLORIDE: 99 meq/L (ref 96–112)
CO2: 26 meq/L (ref 19–32)
Calcium: 8.6 mg/dL (ref 8.4–10.5)
Creatinine, Ser: 0.48 mg/dL — ABNORMAL LOW (ref 0.50–1.35)
GFR calc Af Amer: >90 mL/min (ref >90)
GFR calc non Af Amer: >90 mL/min (ref >90)
GLUCOSE: 115 mg/dL — AB (ref 70–99)
Potassium: 3.9 mEq/L (ref 3.7–5.3)
SODIUM: 134 meq/L — AB (ref 137–147)

## 2013-12-05 LAB — VANCOMYCIN, TROUGH: Vancomycin Tr: 12.2 ug/mL (ref 10.0–20.0)

## 2013-12-05 LAB — ABO/RH: ABO/RH(D): O POS

## 2013-12-05 LAB — PREPARE RBC (CROSSMATCH)

## 2013-12-05 MED ORDER — SODIUM CHLORIDE 0.9 % IV SOLN
500.0000 mg | Freq: Once | INTRAVENOUS | Status: AC
  Administered 2013-12-06: 500 mg via INTRAVENOUS
  Filled 2013-12-05: qty 500

## 2013-12-05 MED ORDER — VANCOMYCIN HCL 10 G IV SOLR
1500.0000 mg | Freq: Three times a day (TID) | INTRAVENOUS | Status: DC
  Administered 2013-12-06: 1500 mg via INTRAVENOUS
  Filled 2013-12-05 (×3): qty 1500

## 2013-12-05 NOTE — Progress Notes (Addendum)
Room [facility] 1419 - [place] by [place] RN Hannah Beat patient given options for home with hospice - chose [facility].   Discussed with CM RN Juliann Pulse and staff RN.  Notes in computer by Dr. Johnnye Sima state mother is unsure she is able to care for pt at home.  Note by Dr. Sheran Fava states she has discussed with pt and family an option is to discharge to hospice home ([facility]) and get pain under control and then allow pt to be discharged home.  Discussed above with Dr. Sheran Fava and she confirms her discussion with pt and family.   Dr. Sheran Fava will place a consult for BP for Thomas Bean SW.  Per Dr. Sheran Fava, pt will have IV ABX for bacteremia for another couple days and may be ready for discharge on Saturday.   LMOVM for Thomas Bean re: upcoming referral and our discussions.   Please call [facility] @ [phone]- ask for RN Liaison or after hours,ask for on-call RN with any hospice needs.   Thank you.  Thomas Lawman, RN  [place] Liaison  [phone].

## 2013-12-05 NOTE — Progress Notes (Signed)
PHARMACY BRIEF NOTE - Drug Level Result  Consult for:  Vancomycin Indication:  Pneumonia  The current Vancomycin dose is 1250 mg IV every 8 hours.  The trough level drawn prior to the 10 p.m. dose tonight is reported as 12.2 mcg/ml.  This level is below the desired range, 15-20 mcg/ml.  Plan:  Add another 500 mg dose to the 1250 mg dose administered at 10 p.m. to provide a small loading dose.  Increase subsequent doses to 1500 mg IV every 8 hours.  [place] 12/05/2013 10:47 PM

## 2013-12-05 NOTE — Consult Note (Addendum)
[facility] for Infectious Disease  Date of Admission:  12/02/2013  Date of Consult:  12/05/2013  Reason for Consult:Staph bacteremia Referring Physician: CHAMP  Impression/Recommendation Staph bacteremia Metastatic squamous cell CA of tongue Hospice  Would Continue his current anbx Repeat his BCx Check TTE Not change his anbx at this point due to pneumonia and bacteremia  Comment His BCx is strep and staph. Raises concern for contamination however a post-obstructive pna could certainly do this as well.  I would not pursue aggressively his source for bacteremia (TEE) and would not take his port out as he is most likely going home with hospice care.  I explained this to pt and his family. I spoke with his mom separately about his prognosis, that he is nearing the end of his life. She is not sure that she can care for him at home.    Thank you so much for this interesting consult,   Bobby Rumpf (pager) [phone] [email or web address]  Thomas Bean is an 53 y.o. male.  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 Adm on 3-23 with 2 weeks of SOB, fatigue, f/c (x 48h). He was found to have temp 102.7, WBC 11.5, and on CT to have progressive invasion, obstruction of LLL and pneumonia vs obstructive pneumonitis.  He was started on vanco/zosyn for presumed HCAP.  He also has a port (placed 08-2012).    Past Medical History  Diagnosis Date  . Hemorrhoids   . Hypertension     no medications at present  . GERD (gastroesophageal reflux disease)     no medication  . Depression     treated approx 12 years ago, no treatment at this time  . Hiatal hernia   . Lesion of left lung 03/14/12    CT chest, [facility], squamous cell carcinoma  . Facial rash 05/24/2012  . Tongue cancer 12/2010    left side of tongue T1 lesion excised 4/12  . Lung metastasis dx'd 03/2012   . Cancer 08/2011    oral tongue  . Hx of radiation therapy 07/24/12- 07-26-12    left iliac bone, 18 Gy 3 sessions  . Hx of radiation therapy 11/15/11- 01/05/12    left neck, oral cavity6600 cGy 33 sessions  . Bone metastasis 04/25/12    PET scan-left iliac bone  . Hx antineoplastic chemotherapy 2013    Carboplatin/Taxol/Erbitux  . S/P radiation therapy 07/17/2013-07/30/2013     Left chest was treated to a total dose of 30 gray in 10 fractions at 3 gray per fraction    Past Surgical History  Procedure Laterality Date  . Inguinal herniorrhapy    . Tonsillectomy and adenoidectomy    . Excision of tongue mass      April 2012  . Hernia repair    . Neck mass excision  07/27/2011  . Radical neck dissection  07/27/2011    Procedure: RADICAL NECK DISSECTION;  Surgeon: Rozetta Nunnery, MD;  Location: [place];  Service: ENT;  Laterality: Left;  Marland Kitchen Mandibulectomy  09/2011    and scapular free flap  . Modified radical neck dissection  03/27/12    excision of lip lesion, lip benign, right neck squamous cell ca     Allergies  Allergen Reactions  . Morphine And Related Nausea And Vomiting  . Tape Other (See Comments)    Blisters; please use paper  tape  . Codeine Phosphate Swelling and Rash    Medications:  Scheduled: . dexamethasone  2 mg Oral Daily  . feeding supplement (ENSURE COMPLETE)  237 mL Oral TID WC  . fentaNYL  25 mcg Transdermal Q72H  . gabapentin  300 mg Oral QAC breakfast  . gabapentin  600 mg Oral Q1200  . gabapentin  900 mg Oral QHS  . HYDROmorphone PCA 0.3 mg/mL   Intravenous 6 times per day  . ipratropium-albuterol  3 mL Nebulization TID  . pantoprazole  40 mg Oral BID AC  . piperacillin-tazobactam (ZOSYN)  IV  3.375 g Intravenous 3 times per day  . polyethylene glycol  17 g Oral Once per day on Mon Wed Fri  . sertraline  100 mg Oral Daily  . sucralfate  1 g Oral TID WC & HS  . vancomycin  1,250 mg Intravenous Q8H    Abtx:  Anti-infectives   Start      Dose/Rate Route Frequency Ordered Stop   12/04/13 2200  piperacillin-tazobactam (ZOSYN) IVPB 3.375 g     3.375 g 12.5 mL/hr over 240 Minutes Intravenous 3 times per day 12/04/13 1939     12/04/13 1400  vancomycin (VANCOCIN) 1,250 mg in sodium chloride 0.9 % 250 mL IVPB     1,250 mg 166.7 mL/hr over 90 Minutes Intravenous Every 8 hours 12/04/13 0726     12/02/13 2200  vancomycin (VANCOCIN) IVPB 1000 mg/200 mL premix  Status:  Discontinued     1,000 mg 200 mL/hr over 60 Minutes Intravenous Every 8 hours 12/02/13 1339 12/04/13 0726   12/02/13 2200  ceFEPIme (MAXIPIME) 1 g in dextrose 5 % 50 mL IVPB  Status:  Discontinued     1 g 100 mL/hr over 30 Minutes Intravenous 3 times per day 12/02/13 1756 12/04/13 1931   12/02/13 1430  vancomycin (VANCOCIN) 2,000 mg in sodium chloride 0.9 % 500 mL IVPB     2,000 mg 250 mL/hr over 120 Minutes Intravenous  Once 12/02/13 1337 12/02/13 1610   12/02/13 1400  ceFEPIme (MAXIPIME) 1 g in dextrose 5 % 50 mL IVPB  Status:  Discontinued     1 g 100 mL/hr over 30 Minutes Intravenous Every 8 hours 12/02/13 1337 12/02/13 2054   12/02/13 1045  cefTRIAXone (ROCEPHIN) 1 g in dextrose 5 % 50 mL IVPB     1 g 100 mL/hr over 30 Minutes Intravenous  Once 12/02/13 1036 12/02/13 1343   12/02/13 1045  azithromycin (ZITHROMAX) 500 mg in dextrose 5 % 250 mL IVPB     500 mg 250 mL/hr over 60 Minutes Intravenous  Once 12/02/13 1036 12/02/13 1343      Total days of antibiotics 3 (vanco/zosyn)          Social History:  reports that he quit smoking about 2 years ago. His smoking use included Cigarettes. He has a 5 pack-year smoking history. He has never used smokeless tobacco. He reports that he drinks alcohol. He reports that he does not use illicit drugs.  Family History  Problem Relation Age of Onset  . Adopted: Yes  . Other      biological parents may have had cerebral aneurysm   . Vision loss Paternal Aunt     General ROS: decreased apetite, normal BM, normal  urination, SOB better, no problems with port (swelling, pain, redness). see HPI  Blood pressure 115/70, pulse 96, temperature 98.9 F (37.2 C), temperature source Oral, resp. rate 10, height $RemoveBe'5\' 11"'cVSdYVwPO$  (1.803 m),  weight 78.472 kg (173 lb), SpO2 92.00%. General appearance: alert, cooperative and no distress Eyes: negative findings: conjunctivae and sclerae normal and pupils equal, round, reactive to light and accomodation Throat: mouth/jaw scarred on L.  Neck: L neck scarring.  Lungs: diminished breath sounds anterior - left Chest wall: L chest port is non-tender, no erythema, no fluctuance.  Heart: regular rate and rhythm Abdomen: normal findings: bowel sounds normal and soft, non-tender Extremities: edema >3+ BLE and no nail bed or finger tip/toe lesions.    Results for orders placed during the hospital encounter of 12/02/13 (from the past 48 hour(s))  VANCOMYCIN, TROUGH     Status: Abnormal   Collection Time    12/04/13  4:50 AM      Result Value Ref Range   Vancomycin Tr 9.9 (*) 10.0 - 20.0 ug/mL  CBC     Status: Abnormal   Collection Time    12/04/13  4:50 AM      Result Value Ref Range   WBC 8.2  4.0 - 10.5 K/uL   RBC 2.65 (*) 4.22 - 5.81 MIL/uL   Hemoglobin 7.6 (*) 13.0 - 17.0 g/dL   HCT 25.3 (*) 39.0 - 52.0 %   MCV 95.5  78.0 - 100.0 fL   MCH 28.7  26.0 - 34.0 pg   MCHC 30.0  30.0 - 36.0 g/dL   RDW 17.1 (*) 11.5 - 15.5 %   Platelets 346  150 - 400 K/uL  CBC     Status: Abnormal   Collection Time    12/05/13  5:00 AM      Result Value Ref Range   WBC 8.0  4.0 - 10.5 K/uL   RBC 2.35 (*) 4.22 - 5.81 MIL/uL   Hemoglobin 6.8 (*) 13.0 - 17.0 g/dL   Comment: REPEATED TO VERIFY     CRITICAL RESULT CALLED TO, READ BACK BY AND VERIFIED WITH:     ATINER RN AT 0600 ON [id number] BY DLONG   HCT 22.4 (*) 39.0 - 52.0 %   MCV 95.3  78.0 - 100.0 fL   MCH 28.9  26.0 - 34.0 pg   MCHC 30.4  30.0 - 36.0 g/dL   RDW 17.4 (*) 11.5 - 15.5 %   Platelets 321  150 - 400 K/uL  BASIC METABOLIC  PANEL     Status: Abnormal   Collection Time    12/05/13  5:00 AM      Result Value Ref Range   Sodium 134 (*) 137 - 147 mEq/L   Potassium 3.9  3.7 - 5.3 mEq/L   Chloride 99  96 - 112 mEq/L   CO2 26  19 - 32 mEq/L   Glucose, Bld 115 (*) 70 - 99 mg/dL   BUN 12  6 - 23 mg/dL   Creatinine, Ser 0.48 (*) 0.50 - 1.35 mg/dL   Calcium 8.6  8.4 - 10.5 mg/dL   GFR calc non Af Amer >90  >90 mL/min   GFR calc Af Amer >90  >90 mL/min   Comment: (NOTE)     The eGFR has been calculated using the CKD EPI equation.     This calculation has not been validated in all clinical situations.     eGFR's persistently <90 mL/min signify possible Chronic Kidney     Disease.  PREPARE RBC (CROSSMATCH)     Status: None   Collection Time    12/05/13  7:00 AM      Result Value Ref Range  Order Confirmation ORDER PROCESSED BY BLOOD BANK    TYPE AND SCREEN     Status: None   Collection Time    12/05/13  8:55 AM      Result Value Ref Range   ABO/RH(D) O POS     Antibody Screen NEG     Sample Expiration 12/08/2013     Unit Number [id number]     Blood Component Type RED CELLS,LR     Unit division 00     Status of Unit ALLOCATED     Transfusion Status OK TO TRANSFUSE     Crossmatch Result Compatible     Unit Number [id number]     Blood Component Type RBC LR PHER2     Unit division 00     Status of Unit ALLOCATED     Transfusion Status OK TO TRANSFUSE     Crossmatch Result Compatible    ABO/RH     Status: None   Collection Time    12/05/13  8:55 AM      Result Value Ref Range   ABO/RH(D) O POS        Component Value Date/Time   SDES URINE, CLEAN CATCH 12/02/2013 1330   SPECREQUEST NONE 12/02/2013 1330   CULT  Value: NO GROWTH Performed at [facility] 12/02/2013 1330   REPTSTATUS 12/03/2013 FINAL 12/02/2013 1330   No results found. Recent Results (from the past 240 hour(s))  CULTURE, BLOOD (ROUTINE X 2)     Status: None   Collection Time    12/02/13 10:50 AM      Result Value Ref  Range Status   Specimen Description BLOOD RIGHT HAND   Final   Special Requests BOTTLES DRAWN AEROBIC AND ANAEROBIC 3.5ML   Final   Culture  Setup Time     Final   Value: 12/02/2013 13:20     Performed at [facility]   Culture     Final   Value: STAPHYLOCOCCUS AUREUS     Note: RIFAMPIN AND GENTAMICIN SHOULD NOT BE USED AS SINGLE DRUGS FOR TREATMENT OF STAPH INFECTIONS.     VIRIDANS STREPTOCOCCUS     Note: Gram Stain Report Called to,Read Back By and Verified With: ERICA PELLETIR AT 1:53 A.M. ON 12/03/13 WARRB     Performed at [facility]   Report Status PENDING   Incomplete  CULTURE, BLOOD (ROUTINE X 2)     Status: None   Collection Time    12/02/13 10:50 AM      Result Value Ref Range Status   Specimen Description BLOOD LEFT PORT   Final   Special Requests BOTTLES DRAWN AEROBIC AND ANAEROBIC 5CC   Final   Culture  Setup Time     Final   Value: 12/02/2013 13:21     Performed at [facility]   Culture     Final   Value:        BLOOD CULTURE RECEIVED NO GROWTH TO DATE CULTURE WILL BE HELD FOR 5 DAYS BEFORE ISSUING A FINAL NEGATIVE REPORT     Performed at [facility]   Report Status PENDING   Incomplete  URINE CULTURE     Status: None   Collection Time    12/02/13  1:30 PM      Result Value Ref Range Status   Specimen Description URINE, CLEAN CATCH   Final   Special Requests NONE   Final   Culture  Setup Time     Final  Value: 12/02/2013 22:18     Performed at [facility]     Final   Value: NO GROWTH     Performed at [facility]   Culture     Final   Value: NO GROWTH     Performed at [facility]   Report Status 12/03/2013 FINAL   Final      12/05/2013, 10:07 AM     LOS: 3 days          [facility] Staphylococcus aureus bacteremia   Staphylococcus aureus bacteremia (SAB) is associated with a high rate of complications and mortality.  Specific aspects of  clinical management are critical to optimizing the outcome of patients with SAB.  Therefore, the [facility] Antimicrobial Management Team [facility]) has initiated an intervention aimed at improving the management of SAB at [facility].  To do so, Infectious Diseases physicians are providing an evidence-based consult for the management of all patients with SAB.     Yes No Comments  Perform follow-up blood cultures (even if the patient is afebrile) to ensure clearance of bacteremia [x]  []    Remove vascular catheter and obtain follow-up blood cultures after the removal of the catheter []  [x]    Perform echocardiography to evaluate for endocarditis (transthoracic ECHO is 40-50% sensitive, TEE is > 90% sensitive) [x]  []  Please keep in mind, that neither test can definitively EXCLUDE endocarditis, and that should clinical suspicion remain high for endocarditis the patient should then still be treated with an "endocarditis" duration of therapy = 6 weeks  Consult electrophysiologist to evaluate implanted cardiac device (pacemaker, ICD) []  []    Ensure source control []  []  Have all abscesses been drained effectively? Have deep seeded infections (septic joints or osteomyelitis) had appropriate surgical debridement?  Investigate for "metastatic" sites of infection []  []  Does the patient have ANY symptom or physical exam finding that would suggest a deeper infection (back or neck pain that may be suggestive of vertebral osteomyelitis or epidural abscess, muscle pain that could be a symptom of pyomyositis)?  Keep in mind that for deep seeded infections MRI imaging with contrast is preferred rather than other often insensitive tests such as plain x-rays, especially early in a patient's presentation.  Change antibiotic therapy to __________________ []  []  Beta-lactam antibiotics are preferred for MSSA due to higher cure rates.   If on Vancomycin, goal trough should be 15 - 20 mcg/mL  Estimated duration of IV antibiotic  therapy:   []  []  Consult case management for probably prolonged outpatient IV antibiotic therapy

## 2013-12-05 NOTE — Progress Notes (Signed)
Progress Note from the Palliative Medicine Team at [facility]  Subjective: Pain down to 5/10, much improved, 3/10 is tolerable to him. Considering [place] to better manage pain and symptoms to aide in goal to go home. He says he has to sit up in chair and is not comfortable with his legs up at all d/t the pressure being placed on his hiatal hernia; his mother is greatly concerned with wanting him to be comfortable lying down in bed. I would consider transition to dilaudid infusion to better manage pain when home. He is very pleasant and wants very much for his pain to be controlled and to return home - this is his priority. Once he is home and his pain is managed he wishes to collaborate with oncology to attempt an oral systemic agent for his cancer. He lives with his parents who are in their 109s and have great concern in providing care to him at home. I discussed with him to consider his weakness and the assistance he would need to even get up and have a bowel movement (his parents both have back issues and are limited in physical assistance). He said that he hadn't really thought about that and he admits to having an extent of "denial" with these expectations whereas he believes his parents are more realistic to be concerned. He and his parents are agreeable to meeting with Erling Conte, [place] [facility] to discuss interim assistance to manage pain and prepare for management at home.   Objective: Allergies  Allergen Reactions  . Morphine And Related Nausea And Vomiting  . Tape Other (See Comments)    Blisters; please use paper tape  . Codeine Phosphate Swelling and Rash   Scheduled Meds: . dexamethasone  2 mg Oral Daily  . feeding supplement (ENSURE COMPLETE)  237 mL Oral TID WC  . fentaNYL  25 mcg Transdermal Q72H  . gabapentin  300 mg Oral QAC breakfast  . gabapentin  600 mg Oral Q1200  . gabapentin  900 mg Oral QHS  . HYDROmorphone PCA 0.3 mg/mL   Intravenous 6 times per day  .  ipratropium-albuterol  3 mL Nebulization TID  . pantoprazole  40 mg Oral BID AC  . piperacillin-tazobactam (ZOSYN)  IV  3.375 g Intravenous 3 times per day  . polyethylene glycol  17 g Oral Once per day on Mon Wed Fri  . sertraline  100 mg Oral Daily  . sucralfate  1 g Oral TID WC & HS  . vancomycin  1,250 mg Intravenous Q8H   Continuous Infusions: . sodium chloride 75 mL/hr at 12/05/13 0149   PRN Meds:.albuterol, alum & mag hydroxide-simeth, antiseptic oral rinse, diphenhydrAMINE, diphenhydrAMINE, famotidine, LORazepam, naloxone, prochlorperazine, senna-docusate, sodium chloride, sodium chloride  BP 131/71  Pulse 104  Temp(Src) 98.2 F (36.8 C) (Oral)  Resp 10  Ht 5\' 11"  (1.803 m)  Wt 78.472 kg (173 lb)  BMI 24.14 kg/m2  SpO2 92%   PPS: 40%  Pain Score: 5/10 Pain Location Back, ribs, and neck   Intake/Output Summary (Last 24 hours) at 12/05/13 2222 Last data filed at 12/05/13 2036  Gross per 24 hour  Intake 2839.85 ml  Output   2000 ml  Net 839.85 ml      LBM: 12/04/13      Physical Exam:  General:  NAD, sitting in chair, ill appearing HEENT: Dr mucous membranes, facial abnormality r/t treatment for cancer Chest: No labored breathing, symmetric, on oxygen CVS: RRR Ext: BLE 1+  edema Neuro: Alert and oriented x 3, follows commands  Labs: CBC    Component Value Date/Time   WBC 8.0 12/05/2013 0500   WBC 7.4 11/26/2013 0828   RBC 2.35* 12/05/2013 0500   RBC 3.00* 11/26/2013 0828   HGB 8.5* 12/05/2013 1936   HGB 9.1* 11/26/2013 0828   HCT 26.8* 12/05/2013 1936   HCT 27.8* 11/26/2013 0828   PLT 321 12/05/2013 0500   PLT 323 11/26/2013 0828   MCV 95.3 12/05/2013 0500   MCV 92.4 11/26/2013 0828   MCH 28.9 12/05/2013 0500   MCH 30.3 11/26/2013 0828   MCHC 30.4 12/05/2013 0500   MCHC 32.8 11/26/2013 0828   RDW 17.4* 12/05/2013 0500   RDW 19.0* 11/26/2013 0828   LYMPHSABS 1.0 12/02/2013 1050   LYMPHSABS 0.6* 11/26/2013 0828   MONOABS 1.0 12/02/2013 1050   MONOABS 0.5 11/26/2013  0828   EOSABS 0.2 12/02/2013 1050   EOSABS 0.4 11/26/2013 0828   BASOSABS 0.0 12/02/2013 1050   BASOSABS 0.0 11/26/2013 0828    BMET    Component Value Date/Time   NA 134* 12/05/2013 0500   NA 139 11/26/2013 0830   K 3.9 12/05/2013 0500   K 4.0 11/26/2013 0830   CL 99 12/05/2013 0500   CL 102 02/25/2013 0753   CO2 26 12/05/2013 0500   CO2 28 11/26/2013 0830   GLUCOSE 115* 12/05/2013 0500   GLUCOSE 141* 11/26/2013 0830   GLUCOSE 86 02/25/2013 0753   BUN 12 12/05/2013 0500   BUN 11.9 11/26/2013 0830   CREATININE 0.48* 12/05/2013 0500   CREATININE 0.7 11/26/2013 0830   CALCIUM 8.6 12/05/2013 0500   CALCIUM 9.8 11/26/2013 0830   GFRNONAA >90 12/05/2013 0500   GFRAA >90 12/05/2013 0500    CMP     Component Value Date/Time   NA 134* 12/05/2013 0500   NA 139 11/26/2013 0830   K 3.9 12/05/2013 0500   K 4.0 11/26/2013 0830   CL 99 12/05/2013 0500   CL 102 02/25/2013 0753   CO2 26 12/05/2013 0500   CO2 28 11/26/2013 0830   GLUCOSE 115* 12/05/2013 0500   GLUCOSE 141* 11/26/2013 0830   GLUCOSE 86 02/25/2013 0753   BUN 12 12/05/2013 0500   BUN 11.9 11/26/2013 0830   CREATININE 0.48* 12/05/2013 0500   CREATININE 0.7 11/26/2013 0830   CALCIUM 8.6 12/05/2013 0500   CALCIUM 9.8 11/26/2013 0830   PROT 7.5 11/26/2013 0830   PROT 6.8 05/03/2012 0905   ALBUMIN 3.0* 11/26/2013 0830   ALBUMIN 3.8 05/03/2012 0905   AST 16 11/26/2013 0830   AST 14 05/03/2012 0905   ALT 17 11/26/2013 0830   ALT 11 05/03/2012 0905   ALKPHOS 69 11/26/2013 0830   ALKPHOS 54 05/03/2012 0905   BILITOT 0.36 11/26/2013 0830   BILITOT 0.3 05/03/2012 0905   GFRNONAA >90 12/05/2013 0500   GFRAA >90 12/05/2013 0500     Assessment and Plan: 1. Code Status: DNR 2. Symptom Control: 1. Pain: 1. Dilaudid PCA: Basal rate 0.25 mg/hr and Dose 0.5 mg every 12 min prn with lockout 12 mg in.  2. Continue Fentanyl patch 25 mcg until pain better managed with dilaudid.  2. Anxiety: Ativan prn. 3. Bowel Regimen: Senna S scheduled.  3. Psycho/Social: Emotional  support provided to patient and family.  4. Spiritual: Support from personal church.  5. Disposition: [facility] for [facility] management for goal to home with hospice.    Time In Time Out Total Time  Spent with Patient Total Overall Time  1400 1440 23min 62min    Greater than 50%  of this time was spent counseling and coordinating care related to the above assessment and plan.  Vinie Sill, NP Palliative Medicine Team Pager # [phone] (M-F 8a-5p) Team Phone # [phone] (Nights/Weekends)   1

## 2013-12-05 NOTE — Progress Notes (Signed)
CRITICAL VALUE ALERT  Critical value received:  Hemoglobin 6.8  Date of notification:  12/05/2013   Time of notification:  6:02 AM  Critical value read back:yes  Nurse who received alert:  Sharyon Medicus RN  MD notified (1st page):  Tylene Fantasia, NP  Time of first page:  6:05 AM  MD notified (2nd page): Tylene Fantasia , NP  Time of second page: 6:26 AM  Responding MD:  Tylene Fantasia, NP  Time MD responded:  6:31 AM

## 2013-12-05 NOTE — Progress Notes (Signed)
ANTIBIOTIC CONSULT NOTE - FOLLOW UP  Pharmacy Consult for Vancomycin, Zosyn Indication: pneumonia  Allergies  Allergen Reactions  . Morphine And Related Nausea And Vomiting  . Tape Other (See Comments)    Blisters; please use paper tape  . Codeine Phosphate Swelling and Rash    Patient Measurements: Height: 5\' 11"  (180.3 cm) Weight: 173 lb (78.472 kg) (Pt. refused bed wt due to repositioning/pain.) IBW/kg (Calculated) : 75.3  Vital Signs: Temp: 99.7 F (37.6 C) (03/26 1130) Temp src: Oral (03/26 1130) BP: 120/67 mmHg (03/26 1130) Pulse Rate: 107 (03/26 1130) Intake/Output from previous day: 03/25 0701 - 03/26 0700 In: 1387.6 [P.O.:120; I.V.:717.6; IV Piggyback:550] Out: 800 [Urine:800]  Labs:  Recent Labs  12/03/13 0520 12/04/13 0450 12/05/13 0500  WBC 9.0 8.2 8.0  HGB 7.1* 7.6* 6.8*  PLT 299 346 321  CREATININE 0.42*  --  0.48*   Estimated Creatinine Clearance: 112.4 ml/min (by C-G formula based on Cr of 0.48).  Recent Labs  12/04/13 0450  VANCOTROUGH 9.9*     Microbiology: Recent Results (from the past 720 hour(s))  CULTURE, BLOOD (ROUTINE X 2)     Status: None   Collection Time    12/02/13 10:50 AM      Result Value Ref Range Status   Specimen Description BLOOD RIGHT HAND   Final   Special Requests BOTTLES DRAWN AEROBIC AND ANAEROBIC 3.5ML   Final   Culture  Setup Time     Final   Value: 12/02/2013 13:20     Performed at [facility]   Culture     Final   Value: STAPHYLOCOCCUS AUREUS     Note: RIFAMPIN AND GENTAMICIN SHOULD NOT BE USED AS SINGLE DRUGS FOR TREATMENT OF STAPH INFECTIONS.     VIRIDANS STREPTOCOCCUS     Note: Gram Stain Report Called to,Read Back By and Verified With: ERICA PELLETIR AT 1:53 A.M. ON 12/03/13 WARRB     Performed at [facility]   Report Status PENDING   Incomplete  CULTURE, BLOOD (ROUTINE X 2)     Status: None   Collection Time    12/02/13 10:50 AM      Result Value Ref Range Status   Specimen  Description BLOOD LEFT PORT   Final   Special Requests BOTTLES DRAWN AEROBIC AND ANAEROBIC 5CC   Final   Culture  Setup Time     Final   Value: 12/02/2013 13:21     Performed at [facility]   Culture     Final   Value:        BLOOD CULTURE RECEIVED NO GROWTH TO DATE CULTURE WILL BE HELD FOR 5 DAYS BEFORE ISSUING A FINAL NEGATIVE REPORT     Performed at [facility]   Report Status PENDING   Incomplete  URINE CULTURE     Status: None   Collection Time    12/02/13  1:30 PM      Result Value Ref Range Status   Specimen Description URINE, CLEAN CATCH   Final   Special Requests NONE   Final   Culture  Setup Time     Final   Value: 12/02/2013 22:18     Performed at [facility]     Final   Value: NO GROWTH     Performed at [facility]   Culture     Final   Value: NO GROWTH     Performed at [facility]  Report Status 12/03/2013 FINAL   Final    Anti-infectives: 3/23 >> Ceftriaxone x1 3/23 >> Azithromycin x1 3/23 >> Vancomycin >> 3/23 >> Cefepime >> 3/25 3/25 >> Zosyn >>  Assessment: 53yoM presents with SOB and fever. 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 CXR shows possible postobstructive pneumonia. Pharmacy consulted to dose Vancomycin and Cefepime initially, but cefepime changed to Zosyn on 3/25.  1/2 BCxs now growing staph and streph viridans, ID following, no changes to abx at this point.   D#4 Antibiotics 3/23 >> Ceftriaxone x1 3/23 >> Azithromycin x1 3/23 >> Vancomycin >> 3/23 >> Cefepime >> 3/25 3/25 >> Zosyn >>  Tmax: AF now WBCs: decreased to wnl Renal: SCr stable, CrCl >100 N/CG (using rounded 0.8)  3/23 blood x2: 1/2 staph and strep viridans 3/23 urine: NGF 3/26 blood x2: ordered  3/25: VT 0530 =9.9, vancomycin increased 1g q8h to 1250mg  q8h   Goal of Therapy:  Vancomycin trough level 15-20 mcg/ml Appropriate abx dosing, eradication  of infection.   Plan:   Continue Zosyn 3.375g IV Q8H infused over 4hrs.  Continue Vancomycin 1250mg  IV q8h.  Measure Vanc trough at steady state tonight  Follow up renal fxn and culture results.  Ralene Bathe, PharmD, BCPS 12/05/2013, 12:30 PM  Pager: [phone]

## 2013-12-05 NOTE — Progress Notes (Signed)
[facility] PROGRESS NOTE  Thomas Bean:[id number] DOB: April 28, 1961 DOA: 12/02/2013 PCP: Chancy Hurter, MD  Assessment/Plan  S. Aureus bacteremia -  Appreciate ID assistance -  F/u repeat BCx -  Continue vancomycin -  ECHO pending  SIRS with Healthcare associated pneumonia  - Likely post obstructive PNA with fevers and presenting leukocytosis  - Continue empiric vancomycin - continue zosyn - Follow blood cx. Sputum cx -  urine for strep and legionella pending - 02 via [place] prn.  -  Continue albuterol and Atrovent nebs prn  - Patient reports off-and-on hemoptysis which possibly due to lung metastases and underlying postobstructive pneumonia.   Hypertension  Controlled. 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 Juliann Mule -  Consider transfer to inpatient hospice for PCA titration/pain control  - continue Decadron and Neurontin   Chest pain he is attributing to hiatal hernia, although I am suspicious that this is related to his malignancy, improvnig - continue increased PPI and carafate before meals  Anemia progressive.  LIkely due to chemo and malignancy -  Transfuse 2 units PRBC today  Diet:  FLD Access:  port IVF:  IVF Proph:  SCDs  Code Status: DNR Family Communication: paitnet and parents Disposition Plan: continue abx and narrow to clindamycin potentially at discharge.  Consider d/c on dilaudid PCA or possible transfer to inpatient hospice for a few days to get pain controlled before discharging home.  Appreciate palliative care assistance   Consultants:  Oncology Procedures:  Antibiotics:  Cefepime 12/02/13>>>  Vancomycin 12/02/13>>>   HPI/Subjective:  Rested better today and pain is improved.  Objective: Filed Vitals:   12/05/13 1425 12/05/13 1453 12/05/13 1553 12/05/13 1653  BP: 105/58 119/68 129/71 121/67  Pulse: 102 109 112 103   Temp: 99.5 F (37.5 C) 99.4 F (37.4 C) 100.2 F (37.9 C) 99.1 F (37.3 C)  TempSrc: Oral Oral Oral Oral  Resp: 14 11 12 14   Height:      Weight:      SpO2: 91% 92% 92% 92%    Intake/Output Summary (Last 24 hours) at 12/05/13 1939 Last data filed at 12/05/13 1900  Gross per 24 hour  Intake 3107.18 ml  Output   1600 ml  Net 1507.18 ml   Filed Weights   12/02/13 1300 12/02/13 1327 12/02/13 2134  Weight: 78.7 kg (173 lb 8 oz) 78.926 kg (174 lb) 78.472 kg (173 lb)    Exam:   General:  Cachectic CM, No acute distress, stable from prior  HEENT:  NCAT, MMM  Cardiovascular:  RRR, nl S1, S2 no mrg, 2+ pulses, warm extremities  Respiratory:  Absent BS left side, rales at right base, no wheeze or rhonchi, no increased WOB  Abdomen:   NABS, soft, NT/ND, PEG tube in place  MSK:   Normal tone and bulk, 1+ bilateral LEE  Neuro:  Grossly intact  Data Reviewed: Basic Metabolic Panel:  Recent Labs Lab 12/02/13 1050 12/03/13 0520 12/05/13 0500  NA 134* 135* 134*  K 4.1 4.1 3.9  CL 94* 98 99  CO2 27 27 26   GLUCOSE 116* 112* 115*  BUN 16 9 12   CREATININE 0.48* 0.42* 0.48*  CALCIUM 9.0 8.4 8.6   Liver Function Tests: No results found for this basename: AST, ALT, ALKPHOS, BILITOT, PROT, ALBUMIN,  in the last 168 hours No results found for this basename: LIPASE, AMYLASE,  in  the last 168 hours No results found for this basename: AMMONIA,  in the last 168 hours CBC:  Recent Labs Lab 12/02/13 1050 12/03/13 0520 12/04/13 0450 12/05/13 0500  WBC 11.5* 9.0 8.2 8.0  NEUTROABS 9.2*  --   --   --   HGB 8.4* 7.1* 7.6* 6.8*  HCT 26.3* 22.8* 25.3* 22.4*  MCV 94.3 95.0 95.5 95.3  PLT 359 299 346 321   Cardiac Enzymes: No results found for this basename: CKTOTAL, CKMB, CKMBINDEX, TROPONINI,  in the last 168 hours BNP (last 3 results) No results found for this basename: PROBNP,  in the last 8760 hours CBG: No results found for this basename: GLUCAP,  in the last 168  hours  Recent Results (from the past 240 hour(s))  CULTURE, BLOOD (ROUTINE X 2)     Status: None   Collection Time    12/02/13 10:50 AM      Result Value Ref Range Status   Specimen Description BLOOD RIGHT HAND   Final   Special Requests BOTTLES DRAWN AEROBIC AND ANAEROBIC 3.5ML   Final   Culture  Setup Time     Final   Value: 12/02/2013 13:20     Performed at [facility]   Culture     Final   Value: STAPHYLOCOCCUS AUREUS     Note: RIFAMPIN AND GENTAMICIN SHOULD NOT BE USED AS SINGLE DRUGS FOR TREATMENT OF STAPH INFECTIONS.     VIRIDANS STREPTOCOCCUS     Note: Gram Stain Report Called to,Read Back By and Verified With: ERICA PELLETIR AT 1:53 A.M. ON 12/03/13 WARRB     Performed at [facility]   Report Status PENDING   Incomplete  CULTURE, BLOOD (ROUTINE X 2)     Status: None   Collection Time    12/02/13 10:50 AM      Result Value Ref Range Status   Specimen Description BLOOD LEFT PORT   Final   Special Requests BOTTLES DRAWN AEROBIC AND ANAEROBIC 5CC   Final   Culture  Setup Time     Final   Value: 12/02/2013 13:21     Performed at [facility]   Culture     Final   Value:        BLOOD CULTURE RECEIVED NO GROWTH TO DATE CULTURE WILL BE HELD FOR 5 DAYS BEFORE ISSUING A FINAL NEGATIVE REPORT     Performed at [facility]   Report Status PENDING   Incomplete  URINE CULTURE     Status: None   Collection Time    12/02/13  1:30 PM      Result Value Ref Range Status   Specimen Description URINE, CLEAN CATCH   Final   Special Requests NONE   Final   Culture  Setup Time     Final   Value: 12/02/2013 22:18     Performed at [place]     Final   Value: NO GROWTH     Performed at [facility]   Culture     Final   Value: NO GROWTH     Performed at [facility]   Report Status 12/03/2013 FINAL   Final     Studies: No results found.  Scheduled Meds: . dexamethasone  2 mg Oral Daily  . feeding  supplement (ENSURE COMPLETE)  237 mL Oral TID WC  . fentaNYL  25 mcg Transdermal Q72H  . gabapentin  300 mg Oral QAC  breakfast  . gabapentin  600 mg Oral Q1200  . gabapentin  900 mg Oral QHS  . HYDROmorphone PCA 0.3 mg/mL   Intravenous 6 times per day  . ipratropium-albuterol  3 mL Nebulization TID  . pantoprazole  40 mg Oral BID AC  . piperacillin-tazobactam (ZOSYN)  IV  3.375 g Intravenous 3 times per day  . polyethylene glycol  17 g Oral Once per day on Mon Wed Fri  . sertraline  100 mg Oral Daily  . sucralfate  1 g Oral TID WC & HS  . vancomycin  1,250 mg Intravenous Q8H   Continuous Infusions: . sodium chloride 75 mL/hr at 12/05/13 0149    Principal Problem:   Healthcare-associated pneumonia Active Problems:   HYPERTENSION   Squamous cell carcinoma of tongue   Anxiety   Bone metastasis   Cancer related pain   Metastatic carcinoma to lung   SIRS (systemic inflammatory response syndrome)   HCAP (healthcare-associated pneumonia)   Palliative care encounter   Cancer associated pain   Anxiety state, unspecified   Bacteremia    Time spent: 30 min    Thomas Bean, [facility] Pager [phone]. If 7PM-7AM, please contact night-coverage at [email or web address], password [facility] 12/05/2013, 7:39 PM  LOS: 3 days

## 2013-12-05 NOTE — Progress Notes (Signed)
  Echocardiogram 2D Echocardiogram has been postponed by RN due to restricted mobility from chair to bed. Dr. Sheran Fava was notified and acknowledged that the echo will be completed first thing next morning (12/06/13).  Thomas Bean 12/05/2013, 3:13 PM

## 2013-12-05 NOTE — Consult Note (Signed)
[facility] Liaison: Received report from [facility] requesting [facility] evaluation for symptom management followed by possible home with hospice. Chart reviewed. Clinical information being reviewed by [facility] medical team. Will update CSW. Please do not hesitate to contact me with questions. Thank you. Erling Conte LCSW [phone]

## 2013-12-06 DIAGNOSIS — D638 Anemia in other chronic diseases classified elsewhere: Secondary | ICD-10-CM

## 2013-12-06 DIAGNOSIS — I339 Acute and subacute endocarditis, unspecified: Secondary | ICD-10-CM

## 2013-12-06 DIAGNOSIS — A4101 Sepsis due to Methicillin susceptible Staphylococcus aureus: Secondary | ICD-10-CM

## 2013-12-06 DIAGNOSIS — A419 Sepsis, unspecified organism: Secondary | ICD-10-CM

## 2013-12-06 LAB — BASIC METABOLIC PANEL
BUN: 8 mg/dL (ref 6–23)
CALCIUM: 8.9 mg/dL (ref 8.4–10.5)
CO2: 28 mEq/L (ref 19–32)
Chloride: 99 mEq/L (ref 96–112)
Creatinine, Ser: 0.42 mg/dL — ABNORMAL LOW (ref 0.50–1.35)
GFR calc Af Amer: >90 mL/min (ref >90)
GFR calc non Af Amer: >90 mL/min (ref >90)
GLUCOSE: 114 mg/dL — AB (ref 70–99)
Potassium: 3.9 mEq/L (ref 3.7–5.3)
SODIUM: 137 meq/L (ref 137–147)

## 2013-12-06 LAB — CBC
HCT: 26.1 % — ABNORMAL LOW (ref 39.0–52.0)
HEMOGLOBIN: 8.5 g/dL — AB (ref 13.0–17.0)
MCH: 29.6 pg (ref 26.0–34.0)
MCHC: 32.6 g/dL (ref 30.0–36.0)
MCV: 90.9 fL (ref 78.0–100.0)
Platelets: 282 10*3/uL (ref 150–400)
RBC: 2.87 MIL/uL — AB (ref 4.22–5.81)
RDW: 16.7 % — ABNORMAL HIGH (ref 11.5–15.5)
WBC: 9.2 10*3/uL (ref 4.0–10.5)

## 2013-12-06 LAB — TYPE AND SCREEN
ABO/RH(D): O POS
ANTIBODY SCREEN: NEGATIVE
UNIT DIVISION: 0
Unit division: 0

## 2013-12-06 MED ORDER — SUCRALFATE 1 GM/10ML PO SUSP: 1.0000 g | Freq: Three times a day (TID) | ORAL | Status: AC

## 2013-12-06 MED ORDER — POLYETHYLENE GLYCOL 3350 17 G PO PACK: 17.0000 g | PACK | Freq: Every day | ORAL | Status: AC | PRN

## 2013-12-06 MED ORDER — DOCUSATE SODIUM 100 MG PO CAPS: 100.0000 mg | ORAL_CAPSULE | Freq: Two times a day (BID) | ORAL | Status: AC

## 2013-12-06 MED ORDER — BIOTENE DRY MOUTH MT LIQD: 15.0000 mL | OROMUCOSAL | Status: AC | PRN

## 2013-12-06 MED ORDER — SENNOSIDES-DOCUSATE SODIUM 8.6-50 MG PO TABS: 2.0000 | ORAL_TABLET | Freq: Every evening | ORAL | Status: DC | PRN

## 2013-12-06 MED ORDER — HEPARIN SOD (PORK) LOCK FLUSH 100 UNIT/ML IV SOLN
500.0000 [IU] | INTRAVENOUS | Status: AC | PRN
  Administered 2013-12-06: 500 [IU]

## 2013-12-06 MED ORDER — SODIUM CHLORIDE 0.9 % IV SOLN
3.0000 g | Freq: Four times a day (QID) | INTRAVENOUS | Status: DC
  Administered 2013-12-06: 3 g via INTRAVENOUS
  Filled 2013-12-06 (×3): qty 3

## 2013-12-06 MED ORDER — ALUM & MAG HYDROXIDE-SIMETH 200-200-20 MG/5ML PO SUSP: 30.0000 mL | Freq: Four times a day (QID) | ORAL | Status: AC | PRN

## 2013-12-06 MED ORDER — ENSURE COMPLETE PO LIQD: 237.0000 mL | Freq: Three times a day (TID) | ORAL | Status: AC

## 2013-12-06 MED ORDER — SENNA 8.6 MG PO TABS: 2.0000 | ORAL_TABLET | Freq: Every day | ORAL | Status: AC

## 2013-12-06 MED ORDER — IPRATROPIUM-ALBUTEROL 0.5-2.5 (3) MG/3ML IN SOLN: 3.0000 mL | Freq: Three times a day (TID) | RESPIRATORY_TRACT | Status: AC

## 2013-12-06 MED ORDER — BISACODYL 10 MG RE SUPP: 10.0000 mg | RECTAL | Status: AC | PRN

## 2013-12-06 MED ORDER — HYDROMORPHONE 0.3 MG/ML IV SOLN: INTRAVENOUS | Status: AC

## 2013-12-06 MED ORDER — HYDROMORPHONE HCL PF 1 MG/ML IJ SOLN
1.0000 mg | Freq: Once | INTRAMUSCULAR | Status: AC
  Administered 2013-12-06: 1 mg via INTRAVENOUS
  Filled 2013-12-06: qty 1

## 2013-12-06 MED ORDER — LORAZEPAM 0.5 MG PO TABS: 0.5000 mg | ORAL_TABLET | Freq: Four times a day (QID) | ORAL | Status: AC | PRN

## 2013-12-06 MED ORDER — SODIUM CHLORIDE 0.9 % IV SOLN: 3.0000 g | Freq: Four times a day (QID) | INTRAVENOUS | Status: AC

## 2013-12-06 MED ORDER — FAMOTIDINE 40 MG PO TABS: 40.0000 mg | ORAL_TABLET | Freq: Two times a day (BID) | ORAL | Status: AC | PRN

## 2013-12-06 NOTE — Progress Notes (Signed)
ANTIBIOTIC CONSULT NOTE - FOLLOW UP  Pharmacy Consult for Unasyn Indication: pneumonia  Allergies  Allergen Reactions  . Morphine And Related Nausea And Vomiting  . Tape Other (See Comments)    Blisters; please use paper tape  . Codeine Phosphate Swelling and Rash    Patient Measurements: Height: 5\' 11"  (180.3 cm) Weight: 173 lb (78.472 kg) (Pt. refused bed wt due to repositioning/pain.) IBW/kg (Calculated) : 75.3  Vital Signs: Temp: 97.9 F (36.6 C) (03/27 0517) Temp src: Oral (03/27 0517) BP: 127/82 mmHg (03/27 0517) Pulse Rate: 92 (03/27 0517) Intake/Output from previous day: 03/26 0701 - 03/27 0700 In: 3201.1 [I.V.:1811.6; Blood:639.6; IV Piggyback:750] Out: 1600 [Urine:1600]  Labs:  Recent Labs  12/04/13 0450 12/05/13 0500 12/05/13 1936 12/06/13 0550  WBC 8.2 8.0  --  9.2  HGB 7.6* 6.8* 8.5* 8.5*  PLT 346 321  --  282  CREATININE  --  0.48*  --  0.42*   Estimated Creatinine Clearance: 112.4 ml/min (by C-G formula based on Cr of 0.42).  Recent Labs  12/04/13 0450 12/05/13 2105  VANCOTROUGH 9.9* 12.2     Microbiology: Recent Results (from the past 720 hour(s))  CULTURE, BLOOD (ROUTINE X 2)     Status: None   Collection Time    12/02/13 10:50 AM      Result Value Ref Range Status   Specimen Description BLOOD RIGHT HAND   Final   Special Requests BOTTLES DRAWN AEROBIC AND ANAEROBIC 3.5ML   Final   Culture  Setup Time     Final   Value: 12/02/2013 13:20     Performed at [facility]   Culture     Final   Value: STAPHYLOCOCCUS AUREUS     Note: RIFAMPIN AND GENTAMICIN SHOULD NOT BE USED AS SINGLE DRUGS FOR TREATMENT OF STAPH INFECTIONS.     VIRIDANS STREPTOCOCCUS     Note: Gram Stain Report Called to,Read Back By and Verified With: ERICA PELLETIR AT 1:53 A.M. ON 12/03/13 WARRB     Performed at [facility]   Report Status PENDING   Incomplete   Organism ID, Bacteria STAPHYLOCOCCUS AUREUS   Final  CULTURE, BLOOD (ROUTINE X 2)      Status: None   Collection Time    12/02/13 10:50 AM      Result Value Ref Range Status   Specimen Description BLOOD LEFT PORT   Final   Special Requests BOTTLES DRAWN AEROBIC AND ANAEROBIC 5CC   Final   Culture  Setup Time     Final   Value: 12/02/2013 13:21     Performed at [facility]   Culture     Final   Value:        BLOOD CULTURE RECEIVED NO GROWTH TO DATE CULTURE WILL BE HELD FOR 5 DAYS BEFORE ISSUING A FINAL NEGATIVE REPORT     Performed at [facility]   Report Status PENDING   Incomplete  URINE CULTURE     Status: None   Collection Time    12/02/13  1:30 PM      Result Value Ref Range Status   Specimen Description URINE, CLEAN CATCH   Final   Special Requests NONE   Final   Culture  Setup Time     Final   Value: 12/02/2013 22:18     Performed at [facility]     Final   Value: NO GROWTH     Performed at [facility]   Culture     Final   Value: NO GROWTH     Performed at [facility]   Report Status 12/03/2013 FINAL   Final    Assessment: 53yoM presents with SOB and fever. 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 CXR shows possible postobstructive pneumonia. Pharmacy consulted to dose Vancomycin and Cefepime initially, but cefepime changed to Zosyn on 3/25.  1/2 BCxs now growing staph and streph viridans, ID following, TTE negative for vegetations, repeat blood cultures drawn. Antibiotics narrowed to unasyn.   3/23 >> Ceftriaxone x1 3/23 >> Azithromycin x1 3/23 >> Vancomycin >>3/27 3/23 >> Cefepime >> 3/25 3/25 >> Zosyn >> 3/27  Tmax: AF now WBCs: decreased to wnl Renal: SCr stable, CrCl >100 N/CG (using rounded 0.8)  3/23 blood x2: 1/2 MSSA (R-penicillin otherwise pansensitive) and strep viridans 3/23 urine: NGF 3/26 blood x2: ordered  Drug level / dose changes info: 3/25: VT 0530 =9.9, increase 1g q8h to 1250mg  q8h 3/27: VT 2100 = 12.2 at  21:05, incr to 1500 q8h  Goal of Therapy:  Appropriate abx dosing, eradication of infection.   Plan:   Start Unasyn 3g IV q6h  Ralene Bathe, PharmD, BCPS 12/06/2013, 10:40 AM  Pager: [phone]

## 2013-12-06 NOTE — Consult Note (Signed)
[facility] Liaison: [facility] room available for Mr. Grainger if he would like to transfer today. Met with family yesterday. At that time they preferred to speak with Dr. Sheran Fava prior to making decision. Will follow up this am. Thank you. Erling Conte LCSW [phone]

## 2013-12-06 NOTE — Progress Notes (Signed)
  Echocardiogram 2D Echocardiogram has been performed.  Basilia Jumbo 12/06/2013, 8:44 AM

## 2013-12-06 NOTE — Progress Notes (Signed)
RN called report to [facility]. They are ready to accept patient at this time. Setzer, Marchelle Folks

## 2013-12-06 NOTE — Discharge Summary (Signed)
Physician Discharge Summary  Thomas Bean JJO:[id number] DOB: Oct 20, 1960 DOA: 12/02/2013  PCP: Chancy Hurter, MD  Admit date: 12/02/2013 Discharge date: 12/06/2013  Recommendations for Outpatient Follow-up:  1. Transfer to [facility] for antibiotics and ongoing management of pain.   2. Unasyn, last day 12/19/2013.  If there is a problem with continuing IV antibiotics, okay to stop unasyn and use augmentin instead to complete remainder of course per Dr. Johnnye Sima, ID 3. Follow up with Dr. Juliann Mule and Dr. Johnnye Sima in approximately 2 weeks  Discharge Diagnoses:  Principal Problem:   Staphylococcus aureus bacteremia with sepsis Active Problems:   HYPERTENSION   Squamous cell carcinoma of tongue   Anxiety   Bone metastasis   Cancer related pain   Metastatic carcinoma to lung   Healthcare-associated pneumonia   HCAP (healthcare-associated pneumonia)   Palliative care encounter   Cancer associated pain   Anxiety state, unspecified   Bacteremia   Anemia of chronic disease   Discharge Condition: fair, stable  Diet recommendation: Full liquid full liquid  Wt Readings from Last 3 Encounters:  12/02/13 78.472 kg (173 lb)  11/26/13 78.654 kg (173 lb 6.4 oz)  11/04/13 81.058 kg (178 lb 11.2 oz)    History of present illness:  53 year old male with squamous cell carcinoma of the tongue status post radical neck dissection followed by chemotherapy now with chest wall and left lung metastases currently getting palliative chemotherapy (follows with Dr. Juliann Mule) presented to the hospital with 2 weeks history of progressive shortness of breath and pain or left side of the chest and ribs. Patient also reports progressive fatigue and pain symptoms unimproved with current home pain regimen. Patient reports having subjective fever and chills at home for 2 days. He also reports off and on him off this for last 2 weeks.  Patient denies headache, dizziness, , nausea , vomiting, palpitations, SOB,  abdominal pain, bowel or urinary.   Hospital Course:   MSSA bacteremia.  ECHO without vegetation, continue Unasyn for total of 2 weeks  Sepsis secondary to postobstructive pneumonia and MSSA bacteremia.  He was started on broad-spectrum antibiotics with vancomycin and Zosyn, and his fevers trended down. His urine strep and Legionella antigens were negative. One of 2 blood cultures from the day of admission was positive for MSSA. Followup blood cultures are so far no growth to date. He was started on oxygen as needed for shortness of breath.  His antibiotics were narrowed to Unasyn and after speciation and sensitivities were reported as blood culture. Infectious diseases consult and recommended a total of 2 weeks of antibiotic therapy.  Acute hypoxic respiratory failure secondary to obstruction of bronchus by cancer and postobstructive pneumonia.  Continue oxygen.    Hypertension, diet-controlled. Blood pressures have been trending down slightly.  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 He is followed by Doctor Chism who agreed with palliation and transfer to inpatient hospice for titration of PCA for pain control. Dilated care was counseled and agreed that patient's pain was not adequately controlled with oral medications and they will continue to make adjustments to his pain medication that he can place. He continued Decadron and Neurontin.  Chest pain which she attributes to his hiatal hernia, although I am suspicious that this may be related to his malignancy.  Chest CT demonstrated no evidence of PE, however, he has progressive occlusion of the left lower bronchus secondary to malignancy with some postobstructive pneumonia.  He did have some improvement with increased PPI and adding Carafate before meals.  Anemia, progressive and likely secondary to chemotherapy and malignancy. He received transfusion of packed red  blood cells. He should follow up with Doctor Chism in the next one to 2 weeks for reassessment.  Consultants:  Oncology Procedures:  -  CT anterior chest -   echocardiogram  Antibiotics:  Cefepime 12/02/13>>> 3/25 Zosyn 3/25 >> 3/27 Vancomycin 12/02/13>>> 3/27 Unasyn 3/27 >>   Discharge Exam: Filed Vitals:   12/06/13 1127  BP:   Pulse:   Temp:   Resp: 14   Filed Vitals:   12/06/13 0517 12/06/13 0519 12/06/13 0744 12/06/13 1127  BP: 127/82     Pulse: 92     Temp: 97.9 F (36.6 C)     TempSrc: Oral     Resp: 12 12 13 14   Height:      Weight:      SpO2: 91% 92% 95% 93%    General: Cachectic CM, No acute distress, stable from prior  HEENT: NCAT, MMM  Cardiovascular: RRR, nl S1, S2 no mrg, 2+ pulses, warm extremities  Respiratory: Absent BS left side, rales at right base, no wheeze or rhonchi, no increased WOB  Abdomen: NABS, soft, NT/ND, PEG tube in place  MSK: Normal tone and bulk, 1+ bilateral LEE  Neuro: Grossly intact   Discharge Instructions      Discharge Orders   Future Appointments Provider Department Dept Phone   12/16/2013 2:00 PM [facility] [place] [phone]   12/16/2013 2:30 PM [facility] [phone]   12/16/2013 3:30 PM [facility] [phone]   12/23/2013 11:00 AM [facility] Only [facility] [phone]   12/23/2013 11:30 AM [facility] [phone]   Future Orders Complete By Expires   Call MD for:  difficulty breathing, headache or visual disturbances  As directed    Call MD for:  extreme fatigue  As directed    Call MD for:  hives  As directed    Call MD for:  persistant dizziness or light-headedness  As directed    Call MD for:  persistant nausea and vomiting  As directed    Call MD for:  severe uncontrolled pain  As  directed    Call MD for:  temperature >100.4  As directed    Discharge instructions  As directed    Comments:     Transfer to [facility] place   Driving Restrictions  As directed    Comments:     No driving or operating heavy machinery   Increase activity slowly  As directed        Medication List    STOP taking these medications       fentaNYL 25 MCG/HR patch  Commonly known as:  DURAGESIC - dosed mcg/hr     fentaNYL 50 MCG/HR  Commonly known as:  DURAGESIC - dosed mcg/hr     HYDROmorphone 4 MG tablet  Commonly known as:  DILAUDID  Replaced by:  HYDROmorphone PCA 0.3 mg/mL 0.3 mg/mL Soln      TAKE these medications       alum & mag hydroxide-simeth 200-200-20 MG/5ML suspension  Commonly known as:  MAALOX/MYLANTA  Take 30 mLs by mouth every 6 (six) hours as needed for indigestion or heartburn.     Ampicillin-Sulbactam 3 g  in sodium chloride 0.9 % 100 mL  Inject 3 g into the vein every 6 (six) hours.     antiseptic oral rinse Liqd  15 mLs by Mouth Rinse route as needed for dry mouth.     bisacodyl 10 MG suppository  Commonly known as:  DULCOLAX  Place 1 suppository (10 mg total) rectally as needed for moderate constipation.     dexamethasone 2 MG tablet  Commonly known as:  DECADRON  Take 1 tablet (2 mg total) by mouth daily.     docusate sodium 100 MG capsule  Commonly known as:  COLACE  Take 1 capsule (100 mg total) by mouth 2 (two) times daily.     famotidine 40 MG tablet  Commonly known as:  PEPCID  Take 1 tablet (40 mg total) by mouth 2 (two) times daily as needed for heartburn.     feeding supplement (ENSURE COMPLETE) Liqd  Take 237 mLs by mouth 3 (three) times daily with meals.     gabapentin 300 MG capsule  Commonly known as:  NEURONTIN  Take 300-900 mg by mouth 3 (three) times daily. Take one capsule in the morning, 2 capsules at noon, and three capsules at bedtime     HYDROmorphone PCA 0.3 mg/mL 0.3 mg/mL Soln  Commonly known as:  DILAUDID  - Basal  rate:  0.25mg /h  - Bolus:  0.5mg    - Lock out:  12 minutes  - Four hour dose limit:  12mg      ipratropium-albuterol 0.5-2.5 (3) MG/3ML Soln  Commonly known as:  DUONEB  Take 3 mLs by nebulization 3 (three) times daily.     lidocaine-prilocaine cream  Commonly known as:  EMLA  Apply topically as needed. Apply to [facility] site one hour prior to needle stick to numb skin.     LORazepam 0.5 MG tablet  Commonly known as:  ATIVAN  Take 1 tablet (0.5 mg total) by mouth every 6 (six) hours as needed for anxiety.     omeprazole 10 MG capsule  Commonly known as:  PRILOSEC  Take 10 mg by mouth daily.     polyethylene glycol packet  Commonly known as:  MIRALAX  Take 17 g by mouth daily as needed for mild constipation.     polyethylene glycol packet  Commonly known as:  MIRALAX / GLYCOLAX  Take 17 g by mouth 3 (three) times a week.     prochlorperazine 10 MG tablet  Commonly known as:  COMPAZINE  Take 1 tablet (10 mg total) by mouth every 6 (six) hours as needed.     senna 8.6 MG Tabs tablet  Commonly known as:  SENOKOT  Take 2 tablets (17.2 mg total) by mouth at bedtime.     sertraline 100 MG tablet  Commonly known as:  ZOLOFT  Take 1 tablet (100 mg total) by mouth daily.     sucralfate 1 GM/10ML suspension  Commonly known as:  CARAFATE  Take 10 mLs (1 g total) by mouth 4 (four) times daily -  with meals and at bedtime.       Follow-up Information   Follow up with CHISM, DAVID, MD In 2 weeks.   Specialty:  Internal Medicine   Contact information:   [place] [address] [phone]       Follow up with Bobby Rumpf, MD. Schedule an appointment as soon as possible for a visit in 2 weeks. (As needed)    Specialty:  Infectious Diseases  Contact information:   [address]  [address] [place] [address] [phone]        The results of significant diagnostics from this hospitalization (including imaging,  microbiology, ancillary and laboratory) are listed below for reference.    Significant Diagnostic Studies: Dg Chest 2 View  12/02/2013   CLINICAL DATA:  Lung cancer.  Fever pain.  EXAM: CHEST  2 VIEW  COMPARISON:  CT chest abdomen pelvis with contrast 11/18/2013 and CT chest 08/22/2013  FINDINGS: Left subclavian power Port-A-Cath terminates at the junction of superior vena cava and right atrium. Heart size is normal. There is left hilar prominence, similar compared to scout view of recent chest abdomen pelvis CT 11/18/2013. Large peripheral opacity in the superior segment of the left lower lobe corresponds in location to the patient's known left lung mass. The margins of the mass are ill-defined. Associated obstructive pneumonitis/infection cannot be excluded.  The right lung appears well expanded and clear. There are surgical clips at the thoracic inlet on the right. Negative for pleural effusion or pneumothorax. Thin peripheral calcifications in the left upper quadrant are known to be associated with a probably benign splenic lesion as described on prior CT. There are left axillary surgical clips. No acute or suspicious osseous lesion identified.  IMPRESSION: 1. Mass in the superior segment left upper lobe corresponds with known left lung mass is described on prior chest CT. The margins are ill defined, an associated postobstructive pneumonitis cannot be excluded. Number to the right lung is clear. 2. Persistent left hilar prominence likely reflects adenopathy. 3. Right lung is clear. 4. Satisfactory position Port-A-Cath.   Electronically Signed   By: Curlene Dolphin M.D.   On: 12/02/2013 11:37   Ct Chest W Contrast  11/18/2013   CLINICAL DATA:  Squamous cell carcinoma of the tongue. Bone metastasis. Lung metastasis.  EXAM: CT CHEST, ABDOMEN, AND PELVIS WITH CONTRAST  TECHNIQUE: Multidetector CT imaging of the chest, abdomen and pelvis was performed following the standard protocol during bolus administration  of intravenous contrast.  CONTRAST:  120mL OMNIPAQUE IOHEXOL 300 MG/ML  SOLN  COMPARISON:  CT CHEST W/CM dated 08/22/2013; CT ABD/PELVIS W CM dated 08/22/2013; CT CHEST W/CM dated 05/16/2013; CT CHEST W/CM dated 07/05/2012  FINDINGS: CT CHEST FINDINGS  Lungs/Pleura: Mass effect upon the left lower lobe bronchus and posterior aspect of the left mainstem bronchus on images 26 and 27. These remain patent. This is new.  Paraseptal emphysema. Pleural-based superior segment left lower lobe lesion measures 4.5 x 3.7 cm on image 23 versus 5.1 x 3.8 cm at the same level on the prior. Slightly less masslike today, somewhat wedge shaped. Immediately subjacent airspace disease with air bronchograms on image 29 could be related to the radiation therapy. The surrounding satellite nodules are no longer identified anteriorly and posteriorly.  Direct extension of tumor and contiguous adenopathy into the more central portion of the superior segment left lower lobe, left infrahilar region, and left-sided mediastinum. Example image 25/series 4. This area measures 3.2 x 3.5 cm on image 25/series 2 and is contiguous with a 1.1 x 1.7 cm left mediastinal node on the prior exam.  No pleural fluid.  Heart/Mediastinum: No supraclavicular adenopathy. A left-sided Port-A-Cath which terminates at the high right atrium. Heart size upper normal, without pericardial effusion. No central pulmonary embolism, on this non-dedicated study. No right-sided mediastinal or hilar adenopathy.  CT ABDOMEN AND PELVIS FINDINGS  Abdomen/Pelvis: Normal liver. Multi septated calcified splenic lesion  is similar on the order of 5.0 cm.  Gastrostomy appropriately positioned.  Normal pancreas, adrenal glands. Cholelithiasis without acute cholecystitis or biliary ductal dilatation.  Right renal cysts. No retroperitoneal or retrocrural adenopathy. Colonic stool burden suggests constipation. Cecum positioned within the pelvis. Normal terminal ileum and appendix. Normal  small bowel without abdominal ascites. No pelvic adenopathy. Normal urinary bladder and prostate. No significant free fluid.  Bones/Musculoskeletal: No focal osseous lesion.  IMPRESSION: CT CHEST IMPRESSION  1. Mixed response to therapy within the chest.  Overall progression. 2. The left upper lobe peripheral mass is slightly decreased in size. However, there is direct extension medially with contiguous left mediastinal and infrahilar tumor/adenopathy.  CT ABDOMEN AND PELVIS IMPRESSION  1. No evidence of metastatic disease within the abdomen or pelvis. 2. Osseous metastasis detailed on prior PET within the left iliac are not readily CT apparent. 3. Multiseptated calcified splenic lesion is similar to on the prior exam and likely benign. Favor the sequelae of prior infection or hemorrhage. 4.  Possible constipation. 5. Cholelithiasis.   Electronically Signed   By: Abigail Miyamoto M.D.   On: 11/18/2013 16:26   Ct Angio Chest Pe W/cm &/or Wo Cm  12/02/2013   CLINICAL DATA:  Chest pain, cough and hemoptysis. 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 Evaluate for pulmonary embolus.  EXAM: CT ANGIOGRAPHY CHEST WITH CONTRAST  TECHNIQUE: Multidetector CT imaging of the chest was performed using the standard protocol during bolus administration of intravenous contrast. Multiplanar CT image reconstructions and MIPs were obtained to evaluate the vascular anatomy.  CONTRAST:  120mL OMNIPAQUE IOHEXOL 350 MG/ML SOLN  COMPARISON:  Most recent prior chest CT 11/18/2013  FINDINGS: Mediastinum: Progressive interval enlargement of the left hilar confluent nodal mass. At the level of the left mainstem bronchus, the mass measures approximately 4.0 x 3.7 cm today compared to 3.5 x 3.2 cm previously. When measured along its longest axis, the mass measures 5.4 cm compared to 4.6 cm previously. Small right paratracheal lymph nodes are also slightly enlarged at 6 mm compared to 4 mm  previously on image 24 of series 5. Unremarkable appearance of the esophagus. Calcified right aortic 0 pulmonary window noted.  Heart/Vascular: Adequate opacification of the pulmonary arteries to the proximal subsegmental level. Narrowing of the left upper lobe pulmonary artery secondary to mass effect from the left hilar mass. No focal filling defect to suggest acute pulmonary embolus. The heart is within normal limits for size. No pericardial effusion. Left IJ approach central venous catheter. The catheter tip terminates in the upper right atrium. Conventional 3 vessel arch. No aneurysmal dilatation or evidence of dissection.  Lungs/Pleura: Relatively mild paraseptal emphysema most notable in the lung apices. Interlobular septal thickening is noted in both upper lobes. The enlarging mass like consolidation extending from the left hilar mass to the pleural surface. Using a region of bridging soft tissue between the hila an the pleural based mass, the larger component of the pleural based mass measures approximately 4.4 x 4.0 cm today compared to 3.9 x 3.9 cm previously. Persistent irregular soft tissue protruding into the left mainstem bronchus resulting in worsened narrowing of the airway. The supplied left lower lobe bronchus is now almost completely obliterated. The segmental left lower lobe bronchi are impacted with debris and there is new tree-in-bud micro nodularity in a peribronchovascular distribution throughout the associated left lower lobe particularly in the posterobasal and medial basal segments. Small left pleural effusion.  Bones/Soft  Tissues: Irregular mottled appearance of the posterolateral aspect of the left fourth and fifth ribs in the region of the abutting pleural mass concerning for osseous invasion. No pathologic fracture.  Upper Abdomen: No new acute abnormality. Stable appearance of multi-septated calcified lesion in the posterior spleen.  Review of the MIP images confirms the above  findings.  IMPRESSION: 1. Negative for acute pulmonary embolus. 2. Continued progression of metastatic disease compared to 11/18/2013. The pertinent findings on today's examination include progressive invasion and obstruction of the left lower lobe bronchus. This subtending segmental left lower lobe bronchi are impacted with debris and there is associated tree-in-bud micro nodularity in the posterior left lower lobe concerning for pneumonia versus obstructive pneumonitis. Additionally, there has been progressive narrowing of the left mainstem bronchus which may progress to complete obstruction in the near future. 3. Small left pleural effusion may be malignant or parapneumonic. 4. Additional ancillary findings as above.   Electronically Signed   By: Jacqulynn Cadet M.D.   On: 12/02/2013 14:54   Ct Abdomen Pelvis W Contrast  11/18/2013   CLINICAL DATA:  Squamous cell carcinoma of the tongue. Bone metastasis. Lung metastasis.  EXAM: CT CHEST, ABDOMEN, AND PELVIS WITH CONTRAST  TECHNIQUE: Multidetector CT imaging of the chest, abdomen and pelvis was performed following the standard protocol during bolus administration of intravenous contrast.  CONTRAST:  119mL OMNIPAQUE IOHEXOL 300 MG/ML  SOLN  COMPARISON:  CT CHEST W/CM dated 08/22/2013; CT ABD/PELVIS W CM dated 08/22/2013; CT CHEST W/CM dated 05/16/2013; CT CHEST W/CM dated 07/05/2012  FINDINGS: CT CHEST FINDINGS  Lungs/Pleura: Mass effect upon the left lower lobe bronchus and posterior aspect of the left mainstem bronchus on images 26 and 27. These remain patent. This is new.  Paraseptal emphysema. Pleural-based superior segment left lower lobe lesion measures 4.5 x 3.7 cm on image 23 versus 5.1 x 3.8 cm at the same level on the prior. Slightly less masslike today, somewhat wedge shaped. Immediately subjacent airspace disease with air bronchograms on image 29 could be related to the radiation therapy. The surrounding satellite nodules are no longer identified  anteriorly and posteriorly.  Direct extension of tumor and contiguous adenopathy into the more central portion of the superior segment left lower lobe, left infrahilar region, and left-sided mediastinum. Example image 25/series 4. This area measures 3.2 x 3.5 cm on image 25/series 2 and is contiguous with a 1.1 x 1.7 cm left mediastinal node on the prior exam.  No pleural fluid.  Heart/Mediastinum: No supraclavicular adenopathy. A left-sided Port-A-Cath which terminates at the high right atrium. Heart size upper normal, without pericardial effusion. No central pulmonary embolism, on this non-dedicated study. No right-sided mediastinal or hilar adenopathy.  CT ABDOMEN AND PELVIS FINDINGS  Abdomen/Pelvis: Normal liver. Multi septated calcified splenic lesion is similar on the order of 5.0 cm.  Gastrostomy appropriately positioned.  Normal pancreas, adrenal glands. Cholelithiasis without acute cholecystitis or biliary ductal dilatation.  Right renal cysts. No retroperitoneal or retrocrural adenopathy. Colonic stool burden suggests constipation. Cecum positioned within the pelvis. Normal terminal ileum and appendix. Normal small bowel without abdominal ascites. No pelvic adenopathy. Normal urinary bladder and prostate. No significant free fluid.  Bones/Musculoskeletal: No focal osseous lesion.  IMPRESSION: CT CHEST IMPRESSION  1. Mixed response to therapy within the chest.  Overall progression. 2. The left upper lobe peripheral mass is slightly decreased in size. However, there is direct extension medially with contiguous left mediastinal and infrahilar tumor/adenopathy.  CT ABDOMEN AND PELVIS IMPRESSION  1.  No evidence of metastatic disease within the abdomen or pelvis. 2. Osseous metastasis detailed on prior PET within the left iliac are not readily CT apparent. 3. Multiseptated calcified splenic lesion is similar to on the prior exam and likely benign. Favor the sequelae of prior infection or hemorrhage. 4.  Possible  constipation. 5. Cholelithiasis.   Electronically Signed   By: Abigail Miyamoto M.D.   On: 11/18/2013 16:26    Microbiology: Recent Results (from the past 240 hour(s))  CULTURE, BLOOD (ROUTINE X 2)     Status: None   Collection Time    12/02/13 10:50 AM      Result Value Ref Range Status   Specimen Description BLOOD RIGHT HAND   Final   Special Requests BOTTLES DRAWN AEROBIC AND ANAEROBIC 3.5ML   Final   Culture  Setup Time     Final   Value: 12/02/2013 13:20     Performed at [facility]   Culture     Final   Value: STAPHYLOCOCCUS AUREUS     Note: RIFAMPIN AND GENTAMICIN SHOULD NOT BE USED AS SINGLE DRUGS FOR TREATMENT OF STAPH INFECTIONS.     VIRIDANS STREPTOCOCCUS     Note: Gram Stain Report Called to,Read Back By and Verified With: ERICA PELLETIR AT 1:53 A.M. ON 12/03/13 WARRB     Performed at [facility]   Report Status PENDING   Incomplete   Organism ID, Bacteria STAPHYLOCOCCUS AUREUS   Final  CULTURE, BLOOD (ROUTINE X 2)     Status: None   Collection Time    12/02/13 10:50 AM      Result Value Ref Range Status   Specimen Description BLOOD LEFT PORT   Final   Special Requests BOTTLES DRAWN AEROBIC AND ANAEROBIC 5CC   Final   Culture  Setup Time     Final   Value: 12/02/2013 13:21     Performed at [facility]   Culture     Final   Value:        BLOOD CULTURE RECEIVED NO GROWTH TO DATE CULTURE WILL BE HELD FOR 5 DAYS BEFORE ISSUING A FINAL NEGATIVE REPORT     Performed at [facility]   Report Status PENDING   Incomplete  URINE CULTURE     Status: None   Collection Time    12/02/13  1:30 PM      Result Value Ref Range Status   Specimen Description URINE, CLEAN CATCH   Final   Special Requests NONE   Final   Culture  Setup Time     Final   Value: 12/02/2013 22:18     Performed at [facility]     Final   Value: NO GROWTH     Performed at [facility]   Culture     Final   Value: NO GROWTH     Performed at  [facility]   Report Status 12/03/2013 FINAL   Final     Labs: Basic Metabolic Panel:  Recent Labs Lab 12/02/13 1050 12/03/13 0520 12/05/13 0500 12/06/13 0550  NA 134* 135* 134* 137  K 4.1 4.1 3.9 3.9  CL 94* 98 99 99  CO2 27 27 26 28   GLUCOSE 116* 112* 115* 114*  BUN 16 9 12 8   CREATININE 0.48* 0.42* 0.48* 0.42*  CALCIUM 9.0 8.4 8.6 8.9   Liver Function Tests: No results found for this basename: AST, ALT, ALKPHOS, BILITOT, PROT, ALBUMIN,  in the last 168 hours No results found for this basename: LIPASE, AMYLASE,  in the last 168 hours No results found for this basename: AMMONIA,  in the last 168 hours CBC:  Recent Labs Lab 12/02/13 1050 12/03/13 0520 12/04/13 0450 12/05/13 0500 12/05/13 1936 12/06/13 0550  WBC 11.5* 9.0 8.2 8.0  --  9.2  NEUTROABS 9.2*  --   --   --   --   --   HGB 8.4* 7.1* 7.6* 6.8* 8.5* 8.5*  HCT 26.3* 22.8* 25.3* 22.4* 26.8* 26.1*  MCV 94.3 95.0 95.5 95.3  --  90.9  PLT 359 299 346 321  --  282   Cardiac Enzymes: No results found for this basename: CKTOTAL, CKMB, CKMBINDEX, TROPONINI,  in the last 168 hours BNP: BNP (last 3 results) No results found for this basename: PROBNP,  in the last 8760 hours CBG: No results found for this basename: GLUCAP,  in the last 168 hours  Time coordinating discharge: 45 minutes  Signed:  Mozes Sagar  [facility] 12/06/2013, 11:36 AM

## 2013-12-07 LAB — CULTURE, BLOOD (ROUTINE X 2)

## 2013-12-08 LAB — CULTURE, BLOOD (ROUTINE X 2): CULTURE: NO GROWTH

## 2013-12-09 ENCOUNTER — Ambulatory Visit: Payer: Self-pay

## 2013-12-09 ENCOUNTER — Other Ambulatory Visit: Payer: Medicaid Other

## 2013-12-12 LAB — CULTURE, BLOOD (SINGLE): CULTURE: NO GROWTH

## 2013-12-12 LAB — CULTURE, BLOOD (ROUTINE X 2): Culture: NO GROWTH

## 2013-12-16 ENCOUNTER — Ambulatory Visit: Payer: Self-pay

## 2013-12-16 ENCOUNTER — Other Ambulatory Visit: Payer: Self-pay

## 2013-12-16 ENCOUNTER — Other Ambulatory Visit: Payer: Self-pay | Admitting: Internal Medicine

## 2013-12-17 ENCOUNTER — Ambulatory Visit: Payer: Self-pay

## 2013-12-17 ENCOUNTER — Other Ambulatory Visit: Payer: Self-pay

## 2013-12-20 ENCOUNTER — Encounter: Payer: Self-pay | Admitting: Internal Medicine

## 2013-12-20 NOTE — Progress Notes (Signed)
Left a message on Ms Bowden's vmail. She is a Librarian, academic at [employer] I am not sure if she is Ms. McNeil's supervisor-if not I have advised her to forward my message to Ms McNeil's. We need to know if the medicaid is active again. Several faxes sent(bills) and no call back from her. Ivin Booty from [facility] was waiting. Now the patient is at [facility] and they are calling and wanting to know also if medicaid is active.

## 2013-12-23 ENCOUNTER — Ambulatory Visit: Payer: Self-pay

## 2013-12-23 ENCOUNTER — Other Ambulatory Visit: Payer: Self-pay

## 2013-12-27 ENCOUNTER — Encounter: Payer: Self-pay | Admitting: Internal Medicine

## 2013-12-30 NOTE — Consult Note (Signed)
I have reviewed and discussed the care of this patient in detail with the nurse practitioner including pertinent patient records, physical exam findings and data. I agree with details of this encounter.  

## 2014-01-10 NOTE — Progress Notes (Signed)
Per Ms. Thomas Bean has been approved for 11/04/13-04/11/14.

## 2014-01-10 DEATH — deceased

## 2014-04-24 IMAGING — CT CT CHEST W/ CM
2 of 4 series · 16 of 46 positions shown, 18 images · IV contrast (omnipaque)
Comparison: Prior CTs 10/29/2012.

CT CHEST

CLINICAL DATA: Restaging metastatic tongue cancer with known
pulmonary, nodal and osseous metastases.  Prior neck dissection.
Chemotherapy in progress; radiation therapy completed.

CT CHEST, ABDOMEN AND PELVIS WITH CONTRAST
TECHNIQUE: Multidetector CT imaging of the chest, abdomen and
pelvis was performed following the standard protocol during bolus
administration of intravenous contrast.
Contrast: 100mL OMNIPAQUE IOHEXOL 300 MG/ML  SOLN

[Series 2: cap with st · axial · 0.73mm/px · z∈[-700,-80]mm · 13 of 136 slices shown, 15 images]
[im 6/136  soft-tissue]
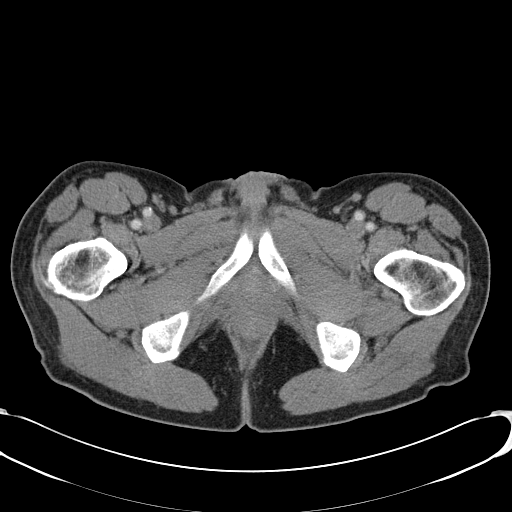
[im 6/136  bone]
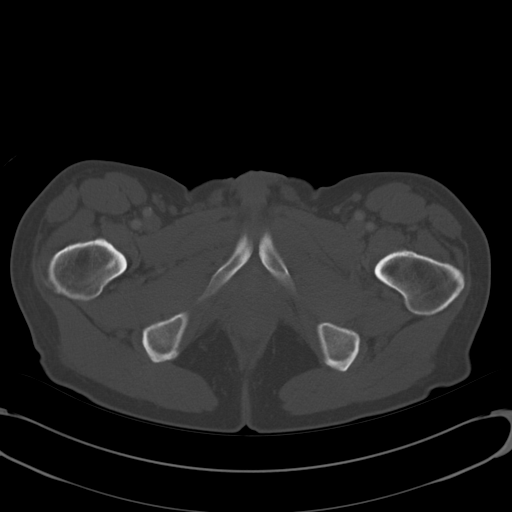
[im 17/136  soft-tissue]
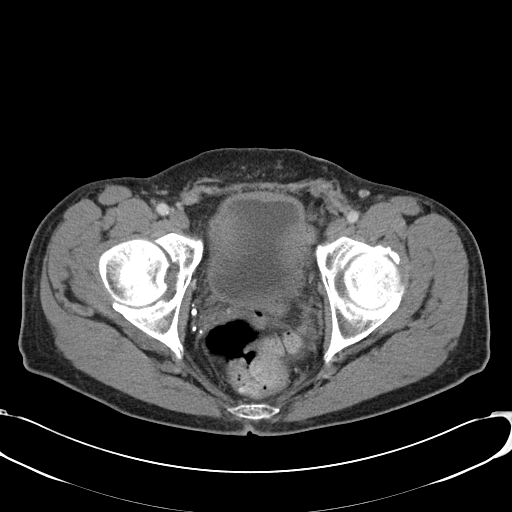
[im 29/136  soft-tissue]
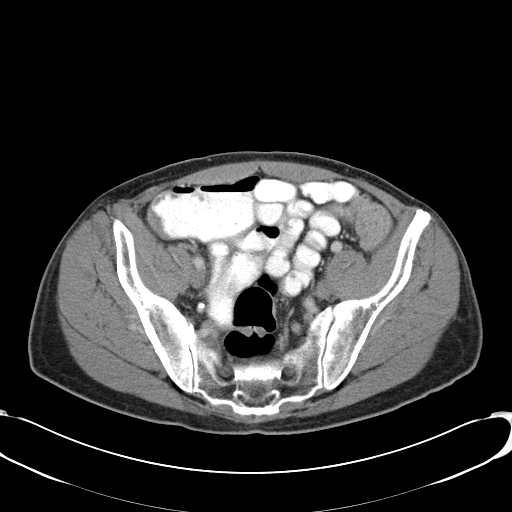
[im 40/136  soft-tissue]
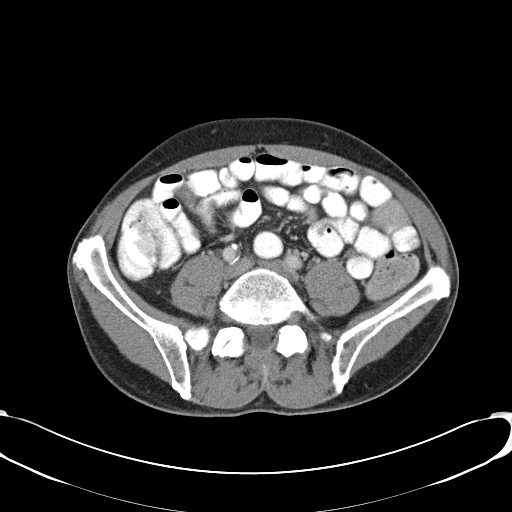
[im 46/136  soft-tissue]
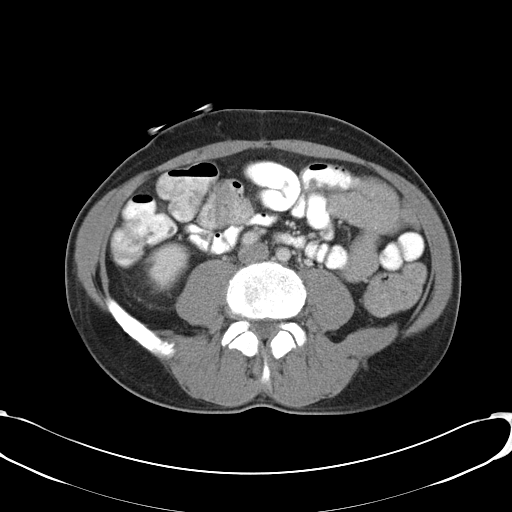
[im 57/136  soft-tissue]
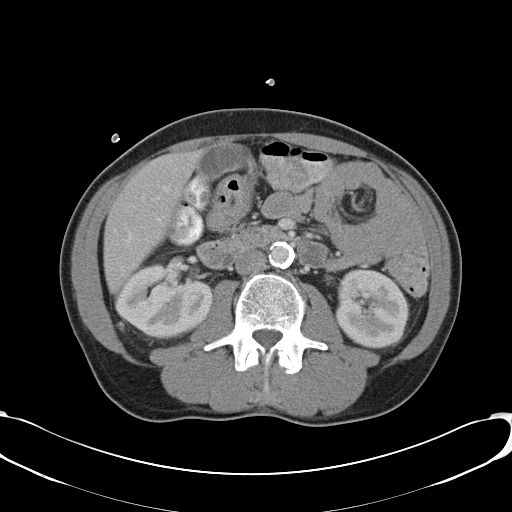
[im 68/136  soft-tissue]
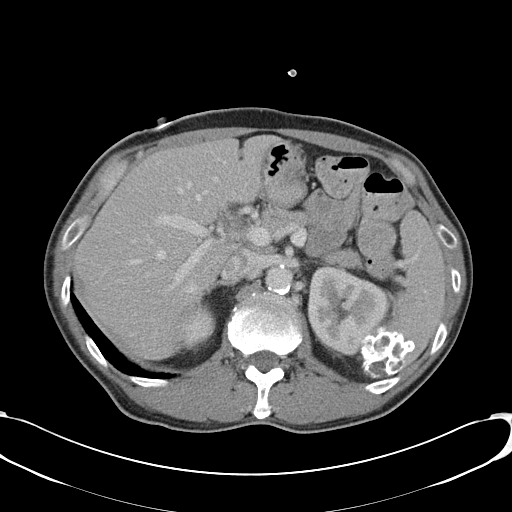
[im 79/136  soft-tissue]
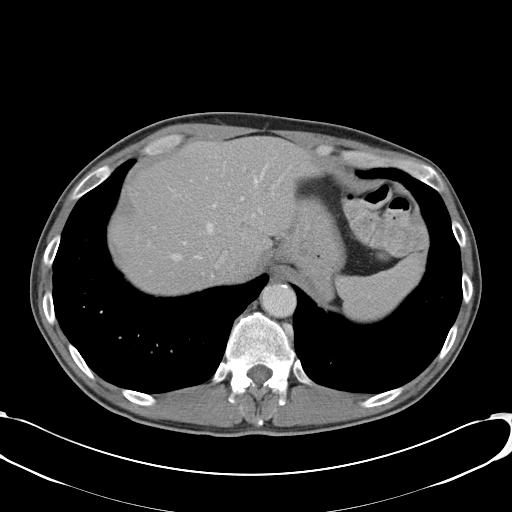
[im 91/136  soft-tissue]
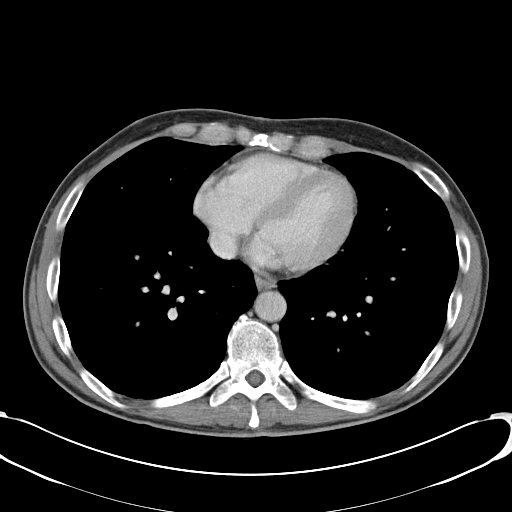
[im 91/136  bone]
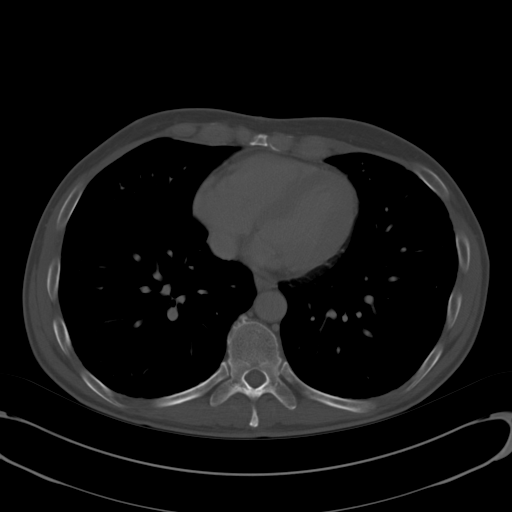
[im 96/136  soft-tissue]
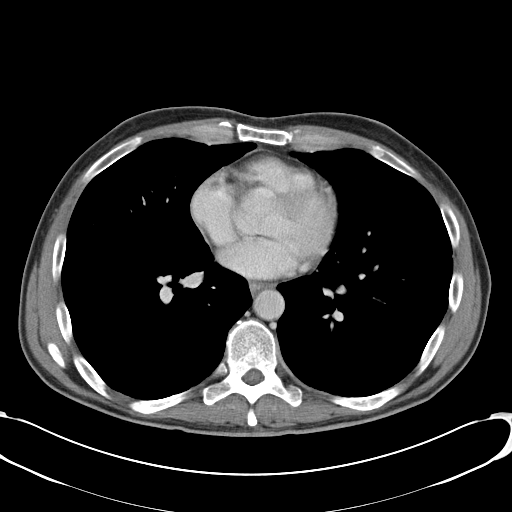
[im 107/136  soft-tissue]
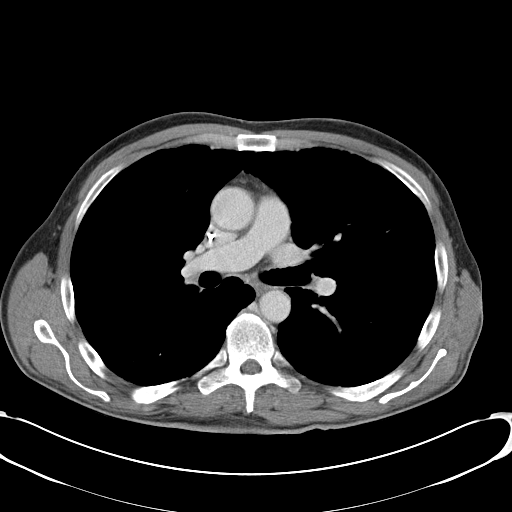
[im 119/136  soft-tissue]
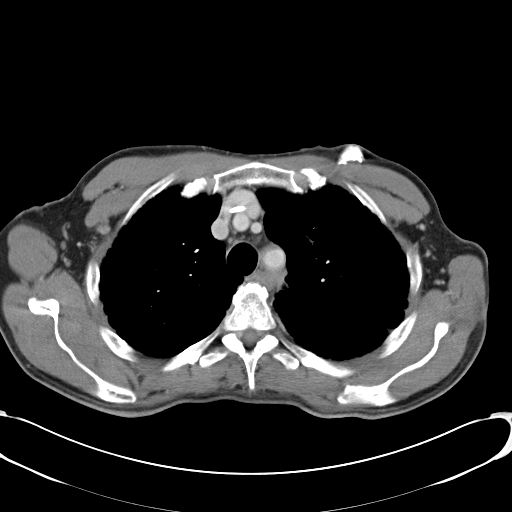
[im 130/136  soft-tissue]
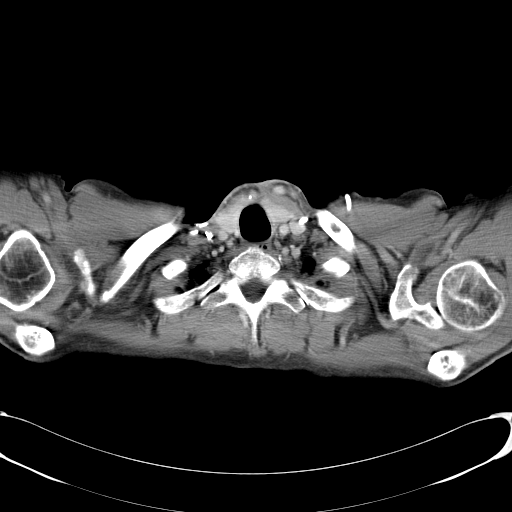

[Series 602: <mpr thick range> · coronal · 1.33mm/px · 3 of 86 slices shown]
[im 29/86  soft-tissue]
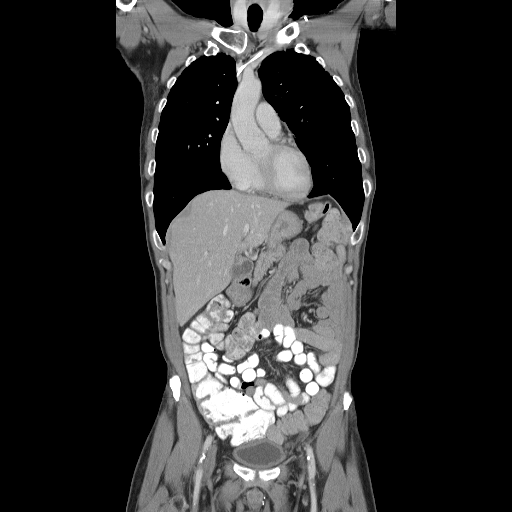
[im 38/86  soft-tissue]
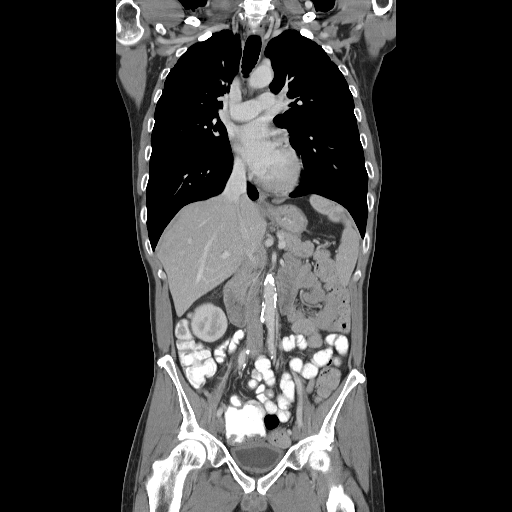
[im 48/86  soft-tissue]
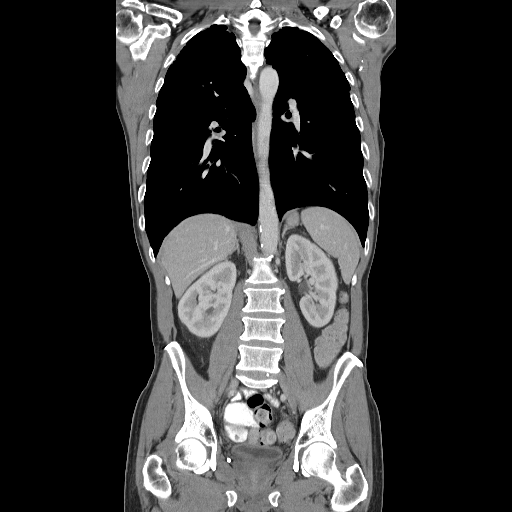

[16 of 46 positions shown; findings below may reference images not displayed]

FINDINGS: Fibrotic changes within the lower right neck appear
grossly stable.  Left IJ Port-A-Cath tip is at the SVC right atrial
junction.  There are no enlarged mediastinal or hilar lymph nodes.
A right axillary lymph node has slightly enlarged, measuring 8 mm
short axis on image 24.

There is no pleural or pericardial effusion.  There has been
interval enlargement of the irregular cavitary lesion within the
superior segment of the left lower lobe, now measuring 3.5 x 2.6 cm
on image 26.  The previously demonstrated nodularity along the
superior aspect of the left major fissure has improved.  A tiny
right lower lobe nodule on image 45 is stable.  New patchy left
lower lobe density on image [DATE] represent atelectasis.  No other
enlarging suspicious nodules are identified.

There are no worrisome osseous findings.
IMPRESSION: 1.  Enlargement of dominant cavitary left lower lobe lesion.  This
could reflect metastatic squamous cell carcinoma or by a primary
lung malignancy.
2.  No other evidence of thoracic metastatic disease.

CT ABDOMEN AND PELVIS
FINDINGS: The multilobulated partially calcified cystic lesion
within the spleen is stable, measuring 6 mm in diameter.  The
spleen otherwise appears unremarkable.  There are probable
noncalcified gallstones within the gallbladder lumen.  There is no
gallbladder wall thickening or biliary dilatation.  The liver,
pancreas and adrenal glands appear unremarkable.

Simple renal cysts bilaterally are unchanged.  There is no
hydronephrosis.

Percutaneous G tube remains in place.  The stomach is decompressed.
No small or large bowel abnormalities are identified.

Aorto iliac atherosclerosis appears stable.  There are no enlarged
abdominal pelvic lymph nodes.  There is no ascites or peritoneal
nodularity.  The bladder, prostate gland and seminal vesicles
appear unremarkable.

No worrisome osseous lesions are identified.  The known lytic
lesion involving the left iliac bone remains poorly visualized by
CT.
IMPRESSION: 1.  Stable abdominal pelvic CT without evidence of progressive
metastatic disease.
2.  The known lesion in the left iliac bone remains poorly
visualized.
3.  Stable multi septated and partially calcified splenic lesion.

## 2014-04-29 ENCOUNTER — Other Ambulatory Visit: Payer: Self-pay | Admitting: Internal Medicine

## 2014-04-30 ENCOUNTER — Other Ambulatory Visit: Payer: Self-pay | Admitting: Internal Medicine
